# Patient Record
Sex: Female | Born: 1955 | Race: White | Hispanic: No | Marital: Single | Smoking: Former smoker
Health system: Southern US, Community
[De-identification: ages and names within clinical notes are randomized; demographics above are authoritative.]

## PROBLEM LIST (undated history)

## (undated) DIAGNOSIS — J9621 Acute and chronic respiratory failure with hypoxia: Secondary | ICD-10-CM

## (undated) DIAGNOSIS — E119 Type 2 diabetes mellitus without complications: Secondary | ICD-10-CM

## (undated) DIAGNOSIS — I219 Acute myocardial infarction, unspecified: Secondary | ICD-10-CM

## (undated) DIAGNOSIS — J449 Chronic obstructive pulmonary disease, unspecified: Secondary | ICD-10-CM

## (undated) DIAGNOSIS — R0602 Shortness of breath: Secondary | ICD-10-CM

## (undated) DIAGNOSIS — I2510 Atherosclerotic heart disease of native coronary artery without angina pectoris: Secondary | ICD-10-CM

## (undated) DIAGNOSIS — I639 Cerebral infarction, unspecified: Secondary | ICD-10-CM

## (undated) DIAGNOSIS — R918 Other nonspecific abnormal finding of lung field: Secondary | ICD-10-CM

## (undated) DIAGNOSIS — I509 Heart failure, unspecified: Secondary | ICD-10-CM

## (undated) DIAGNOSIS — M1990 Unspecified osteoarthritis, unspecified site: Secondary | ICD-10-CM

## (undated) HISTORY — PX: CORONARY ARTERY BYPASS GRAFT: SHX141

## (undated) HISTORY — PX: ABDOMINAL HYSTERECTOMY: SHX81

## (undated) HISTORY — PX: CARDIAC CATHETERIZATION: SHX172

---

## 2004-05-07 ENCOUNTER — Inpatient Hospital Stay: Payer: Self-pay | Admitting: Internal Medicine

## 2005-05-25 ENCOUNTER — Inpatient Hospital Stay (HOSPITAL_COMMUNITY): Admission: EM | Admit: 2005-05-25 | Discharge: 2005-05-27 | Payer: Self-pay | Admitting: Emergency Medicine

## 2005-08-08 ENCOUNTER — Other Ambulatory Visit: Payer: Self-pay

## 2005-08-08 ENCOUNTER — Inpatient Hospital Stay: Payer: Self-pay | Admitting: Internal Medicine

## 2005-11-16 ENCOUNTER — Other Ambulatory Visit: Payer: Self-pay

## 2005-11-16 ENCOUNTER — Inpatient Hospital Stay: Payer: Self-pay | Admitting: Internal Medicine

## 2005-12-17 ENCOUNTER — Inpatient Hospital Stay: Payer: Self-pay | Admitting: General Surgery

## 2005-12-17 ENCOUNTER — Other Ambulatory Visit: Payer: Self-pay

## 2006-07-01 ENCOUNTER — Other Ambulatory Visit: Payer: Self-pay

## 2006-07-01 ENCOUNTER — Inpatient Hospital Stay: Payer: Self-pay | Admitting: Internal Medicine

## 2007-11-07 ENCOUNTER — Ambulatory Visit: Payer: Self-pay

## 2008-03-11 ENCOUNTER — Ambulatory Visit: Payer: Self-pay | Admitting: Pulmonary Disease

## 2008-03-11 DIAGNOSIS — I2510 Atherosclerotic heart disease of native coronary artery without angina pectoris: Secondary | ICD-10-CM | POA: Insufficient documentation

## 2008-03-11 DIAGNOSIS — F17200 Nicotine dependence, unspecified, uncomplicated: Secondary | ICD-10-CM | POA: Insufficient documentation

## 2008-03-11 DIAGNOSIS — I10 Essential (primary) hypertension: Secondary | ICD-10-CM | POA: Insufficient documentation

## 2008-03-11 DIAGNOSIS — E785 Hyperlipidemia, unspecified: Secondary | ICD-10-CM

## 2008-03-13 DIAGNOSIS — J441 Chronic obstructive pulmonary disease with (acute) exacerbation: Secondary | ICD-10-CM | POA: Insufficient documentation

## 2008-06-28 IMAGING — US US THYROID
1 series · 17 of 25 positions shown · non-contrast
Comparison: none

REASON FOR EXAM: Nodule
COMMENTS:

[Series 1: us thyroid · 17 of 40 slices shown]
[im 1/40]
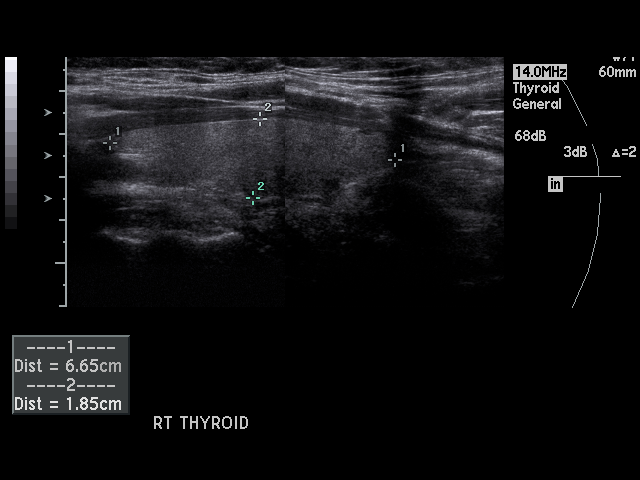
[im 4/40]
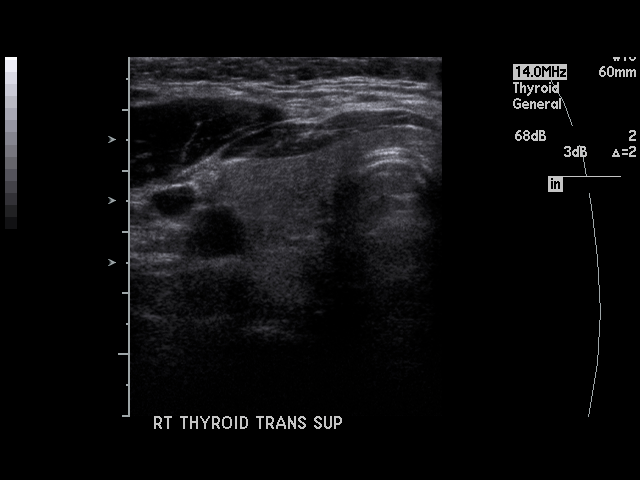
[im 5/40]
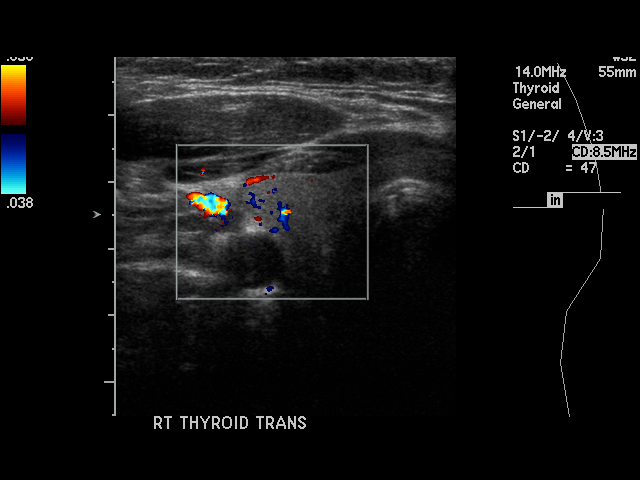
[im 9/40]
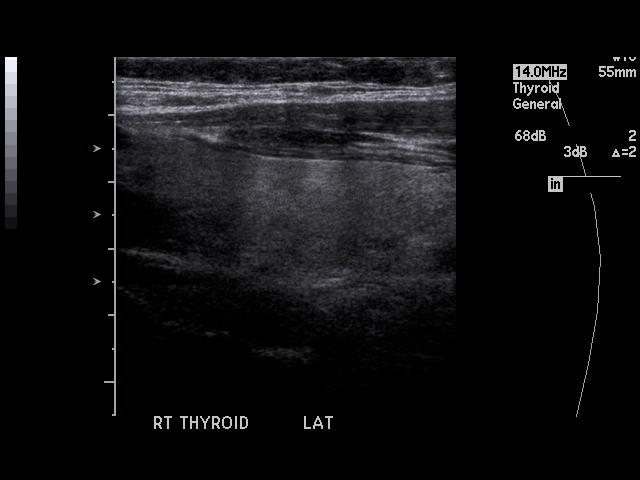
[im 10/40]
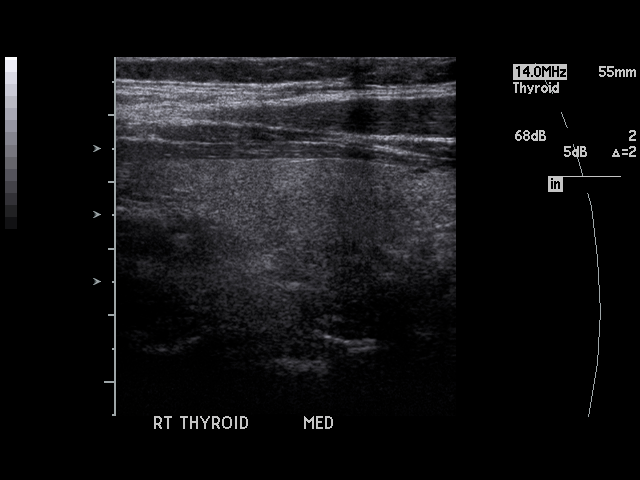
[im 14/40]
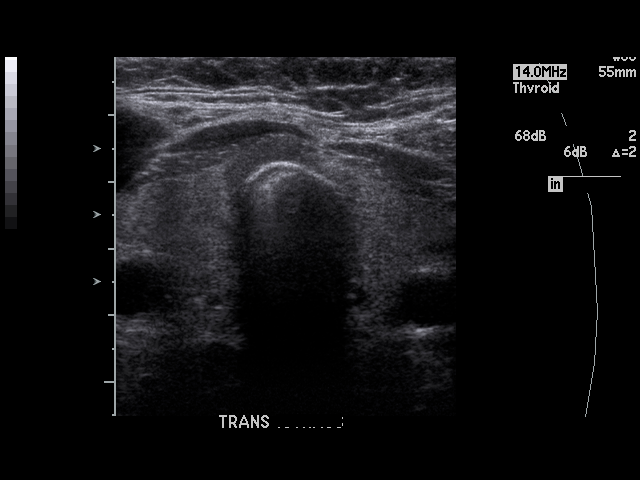
[im 15/40]
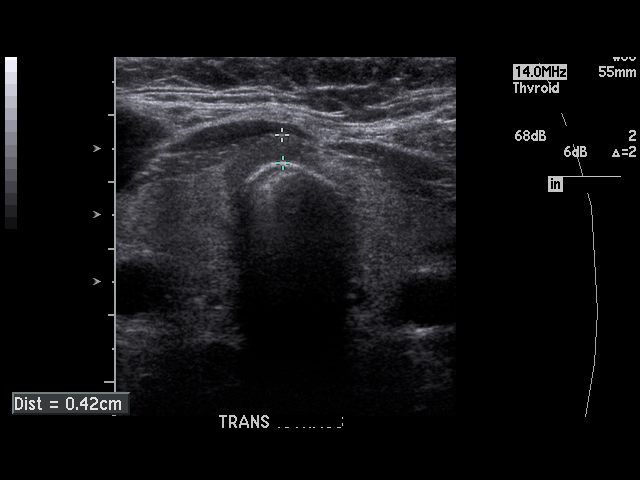
[im 18/40]
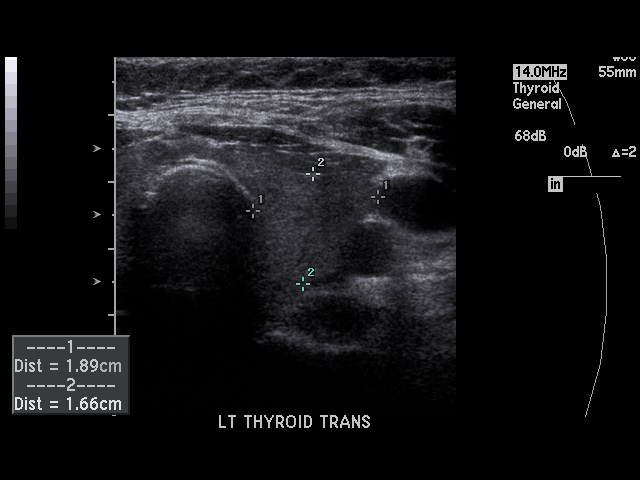
[im 20/40]
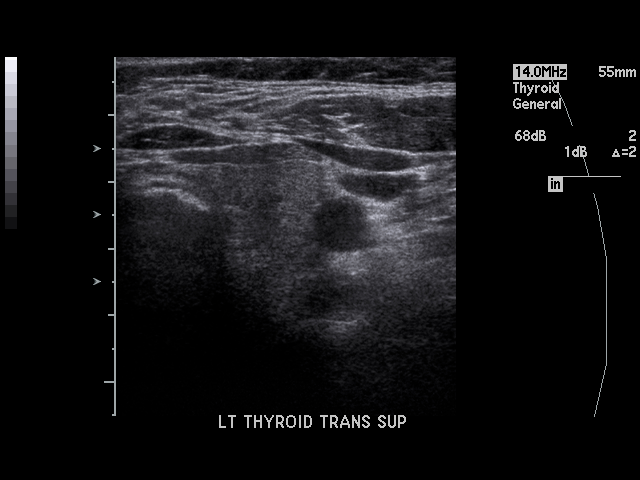
[im 22/40]
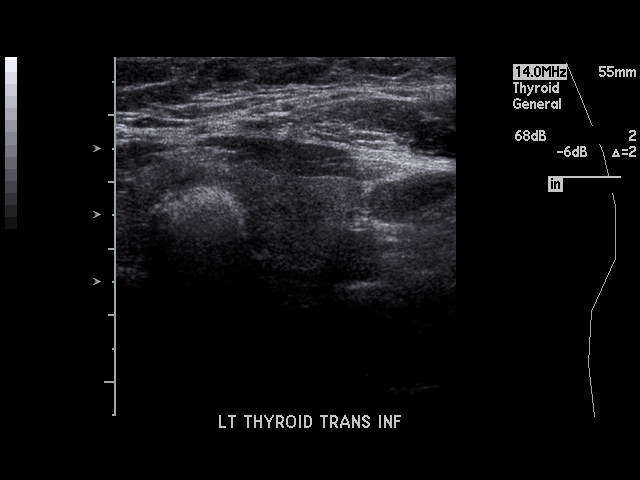
[im 25/40]
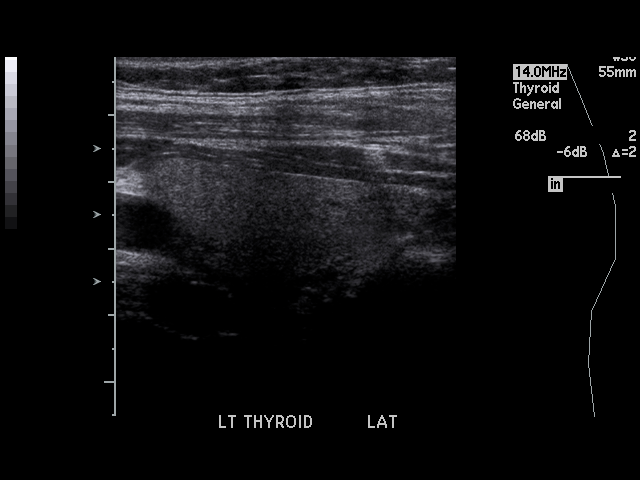
[im 27/40]
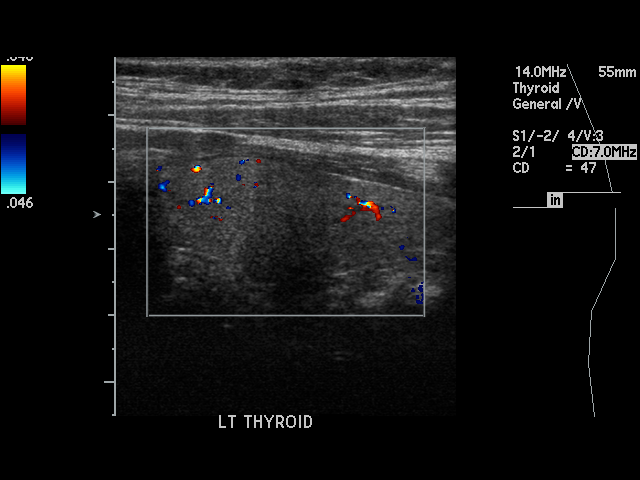
[im 30/40]
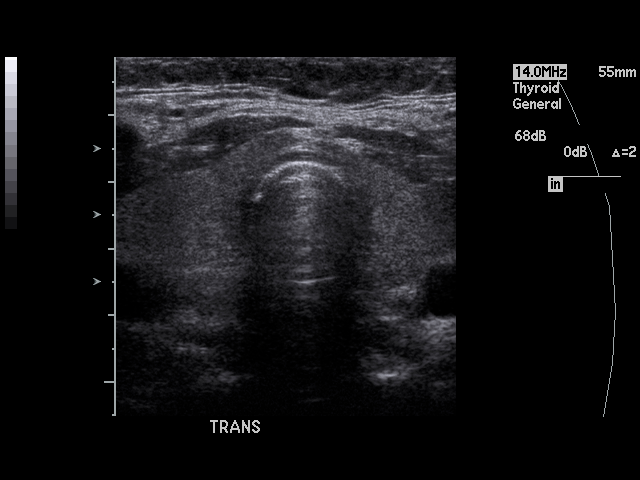
[im 31/40]
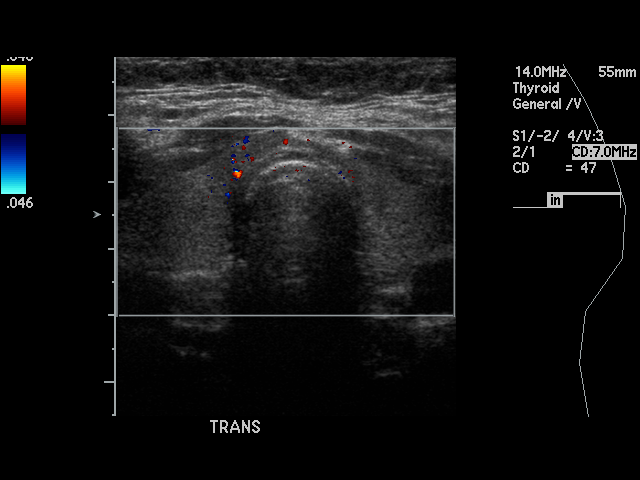
[im 35/40]
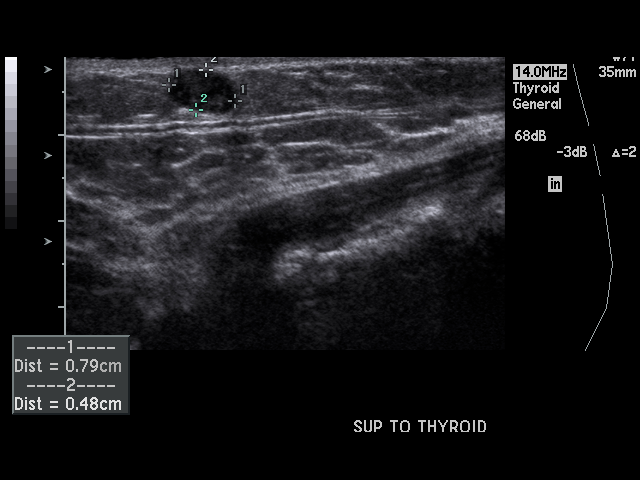
[im 36/40]
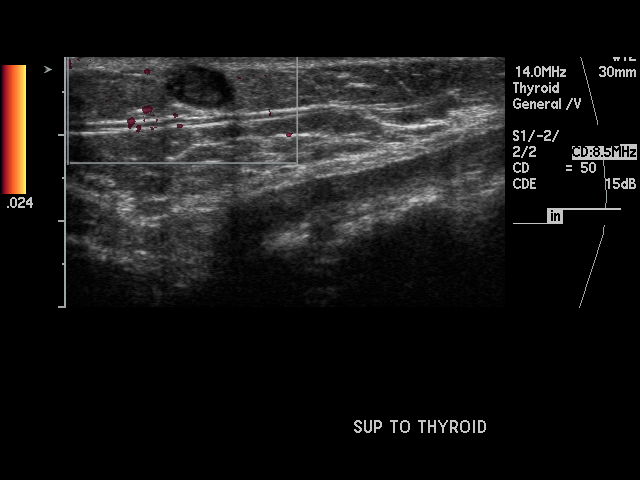
[im 40/40]
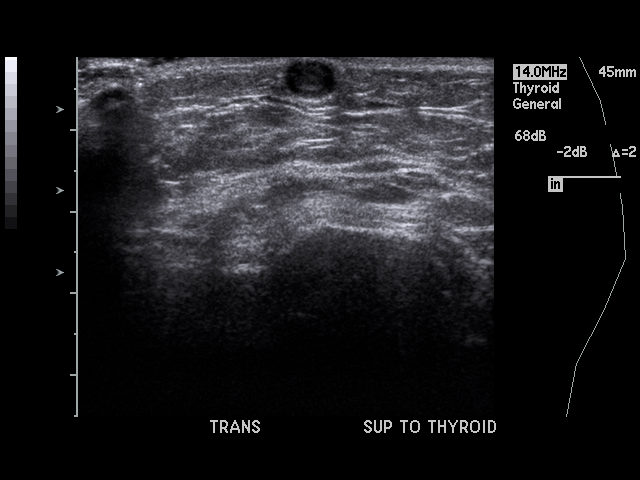

[17 of 25 positions shown; findings below may reference images not displayed]

PROCEDURE:     US  - US THYROID  - November 20, 2005  [DATE]

RESULT:       The RIGHT lobe of the thyroid measures 6.65 cm x 1.79 cm x
1.85 cm and the LEFT lobe measures 5.28 cm x 1.89 cm x 1.66 cm.  No focal
thyroid mass lesions are seen.  There is noted a hypoechoic subcutaneous
nodule measuring 7.9 mm at maximum diameter.  This nodule is hypoechoic and
extends to the inner margin of the skin.  The nodule is anterior to the
thyroid gland and appears to represent a subcutaneous complex cyst.  No
other significant abnormalities are seen.
IMPRESSION: 1.     The thyroid lobes bilaterally appear large but no thyroid masses or
nodules are identified.
2.     There is a hypoechoic subcutaneous nodule as described above.  This
contains some echoes but is thought to likely represent a complex
subcutaneous cyst.  At any rate, the nodule is separate from the thyroid and
Doppler examination shows no intrinsic vascularity.

## 2009-01-25 ENCOUNTER — Encounter: Payer: Self-pay | Admitting: Pulmonary Disease

## 2009-01-27 ENCOUNTER — Encounter (INDEPENDENT_AMBULATORY_CARE_PROVIDER_SITE_OTHER): Payer: Self-pay | Admitting: *Deleted

## 2009-02-19 ENCOUNTER — Encounter: Payer: Self-pay | Admitting: Pulmonary Disease

## 2009-03-02 ENCOUNTER — Encounter (INDEPENDENT_AMBULATORY_CARE_PROVIDER_SITE_OTHER): Payer: Self-pay | Admitting: *Deleted

## 2010-03-08 NOTE — Letter (Signed)
Summary: Appointment - Missed  [facility]     [place], [place]    Phone:   Fax:      March 02, 2009 MRN: [id number]   [name] [address] [place], [place]  [address]   Dear [name],  Our records indicate you missed your appointment on  03-01-2009    with  Dr. Jens Som  It is very important that we reach you to reschedule this appointment. We look forward to participating in your health care needs. Please contact us at the number listed above at your earliest convenience to reschedule this appointment.     Sincerely,   Lorne Skeens  [facility] Scheduling Team

## 2010-03-08 NOTE — Letter (Signed)
Summary: [facility]       [facility]       [place]   Imported By: Lester Unadilla 02/12/2009 10:02:22  _____________________________________________________________________  External Attachment:    Type:   Image     Comment:   External Document

## 2010-03-08 NOTE — Letter (Signed)
SummaryScience writer Pulmonary Care Appointment Letter  [facility]  [address]   [place], [place] [address]   Phone: [phone]  Fax: [phone]    02/19/2009 MRN: [id number]  KYRSTYN GREEAR [address] [place], [place]  [address]  Dear Ms. Alioto,   Our office is attempting to contact you about an appointment.  Please call our office at [phone] to schedule this appointment with Dr.___Alva____.  Our registration staff is prepared to assist you with any questions you may have.    Thank you,   Nature conservation officer Pulmonary Division

## 2010-06-24 NOTE — Discharge Summary (Signed)
NAMECHRISLYN, [name]               ACCOUNT NO.:  [id number]   MEDICAL RECORD NO.:  [id number]          PATIENT TYPE:  INP   LOCATION:  5505                         FACILITY:  [facility]   PHYSICIAN:  [name], MD  DATE OF BIRTH:  09/04/1955   DATE OF ADMISSION:  05/24/2005  DATE OF DISCHARGE:  05/27/2005                                 DISCHARGE SUMMARY   CHIEF COMPLAINT:  Shortness of breath and cough.   DISCHARGE DIAGNOSES:  1.  Chronic obstructive pulmonary disease exacerbation. The patient was      admitted to the telemetry floor and treated with IV steroids and      antibiotic therapy as well as nebulizers. She has refused therapy and      been non-compliant with therapy during the course of the hospital stay      and wishes to discharge to home at this time. She remains with      exacerbation type symptoms, namely coarse rhonchi and wheezing with      decreased saturation. At this time, I will provide her with bridging      antibiotics and steroids until she sees her primary care physician on      Monday, as this is a potentially life threatening situation and      regardless of her poor decision making, I will provide her with these      medications. The patient understands that she needs to followup with her      primary care practitioner.  2.  Coronary artery disease with history of coronary artery bypass grafting.      The patient refuses to comply with dietary recommendations for coronary      artery disease, namely low-salt, low-cholesterol.  3.  The patient has moderate diarrhea related to a laxative used during her      hospital stay. This has resolved.  4.  Hyperlipidemia. She will remain on her Lipitor.  5.  B12 deficiency.  She will remain on vitamin B12 as previously      prescribed.   ALLERGIES:  CIPROFLOXACIN.   DISCHARGE MEDICATIONS:  1.  Ceftin 500 mg p.o. b.i.d.  2.  Azithromycin 500 mg p.o. daily.  3.  Prednisone 60 mg once daily, which should be  tapered after she sees her      primary care physician.  4.  Metoprolol 50 mg p.o. b.i.d.  5.  Isosorbide 60 mg once daily.  6.  Lipitor 20 mg daily.  7.  Aspirin 325 mg daily.  8.  Combivent inhaler q.i.d.  9.  Milk of Magnesia as needed.  10. B12 1,000 mcg as previously prescribed. Dose at this time is unknown.  11. Vitamin B3 as previously prescribed. Dose at this time is unknown.   HISTORY OF PRESENT ILLNESS:  The patient is a 55 year old white female with  a past medical history for chronic obstructive pulmonary disease, coronary  artery disease status post coronary artery bypass grafting, tobacco abuse,  hypertension, and hypercholesterolemia. The patient presented to the  emergency room with shortness of breath, cough, and hypoxia. The patient was  found to have a negative chest x-ray but signs and symptoms of COPD  exacerbation. She was admitted to the telemetry floor, started on steroids,  antibiotics, and nebulizer treatments, which required aggressive adjustment  because of a so severe cough with hypoxia. The patient, at this time, does  not feel that she needs to be in the hospital and therefore, has refused to  stay in the hospital for further treatment, despite the fact that she has  coarse wheezing and severe cough. I will provide her with 3 days worth of  antibiotic and steroids, so that she can get an appointment with her primary  care physician but I am recommending that she leave the hospital against my  advise, as she is not ready to go home. She puts herself at risk for  complications by leaving the hospital.   Please note that the patient was non-compliant with therapy at [facility]. She was found smoking in the bathroom. She would not use her  oxygen. The patient appears to be able to comprehend the gravity of her  illness but refuses to accept the care that his prescribed by standard.   PHYSICAL EXAMINATION:  VITAL SIGNS:  Temperature 97, blood  pressure 121/86,  pulse 107, respiratory rate 18. Saturations are 94%.  GENERAL:  She is a moderately obese white female who is anxious and angry.  She does not appear to be in respiratory distress but has frequent coughing  spells that are choking.  CHEST:  Has bilateral coarse rhonchi with wheezing in all quadrants.  CARDIOVASCULAR:  Regular rate and rhythm. Positive S1 and S2. No S3 and S3.  No murmur, rub, or gallop.  ABDOMEN:  Soft, nontender, and nondistended with positive bowel sounds.  EXTREMITIES:  No clubbing, cyanosis, or edema.  NEUROLOGIC:  Awake, alert, and has the capacity to make decisions for  herself.   LABORATORY DATA:  Pertinent laboratory values reveal a white count of 18.5.  Hemoglobin and hematocrit 13.8 and 41.6. Platelets 242,000. Sodium 147,  potassium 3.6, chloride 107, CO2 32, BUN 10, creatinine 0.7. Her BNP is 62.   IMPRESSION:  At this time, the patient is NOT stable for discharge, however,  refuses to stay in the hospital and therefore, I am discharging her against  medical advice but providing bridging antibiotics and steroids until she can  reach her primary care physician.   DISPOSITION:  The patient to home.   FOLLOW UP:  With Dr. Jason Fila in [place].      [name], MD  Electronically Signed     MLT/MEDQ  D:  05/27/2005  T:  05/29/2005  Job:  [id number]   cc:   Wilfrid Lund  Fax: [phone]

## 2010-06-24 NOTE — H&P (Signed)
[name], [name] NO.:  [id number]   MEDICAL RECORD NO.:  [id number]          PATIENT TYPE:  EMS   LOCATION:  MAJO                         FACILITY:  [facility]   PHYSICIAN:  Deirdre Peer. Polite, [name] DATE OF BIRTH:  02-26-1955   DATE OF ADMISSION:  05/24/2005  DATE OF DISCHARGE:                                HISTORY & PHYSICAL   CHIEF COMPLAINT:  Shortness of breath and wheezing.   HISTORY OF PRESENT ILLNESS:  A 55 year old female with known history of  COPD, coronary artery disease, hypertension, high cholesterol who presents  to the ED with complaints of shortness of breath and wheeze.  The patient  states the symptoms started approximately 2 days ago.  She denies any fever  or chills, but a raspy cough with white colored sputum.  The patient does  admit to continued tobacco use, smokes two packs per day, and not on home  oxygen.  The patient presented to [facility], had an x-ray, and  was sent to [facility] ED for further evaluation.  In the ED, the  patient was evaluated and found to be afebrile.  Pulse was 110, respiratory  rate 28, saturating 84%.  The patient had screening labs.  BMET within  normal limits.  CBC within normal limits, BNP of 45.  Chest x-ray;  hyperinflated lung fields.  No acute infiltrate.  EKG; sinus tachycardia.  [facility] were called for further evaluation.  At the time of my  evaluation, the patient was alert and oriented x3, still with audible wheeze  and describes events as stated above.  Admission is deemed necessary for  further evaluation of COPD exacerbation.   PAST MEDICAL HISTORY:  As stated above.   MEDICATIONS:  1.  Aspirin 325 mg daily.  2.  Metoprolol 50 mg daily.  3.  Isosorbide mononitrate 60 mg daily.  4.  Lipitor 20 mg daily.   SOCIAL HISTORY:  Positive for tobacco, 2 packs a day.  No alcohol and no  drugs.   PAST SURGICAL HISTORY:  Hysterectomy in the distant past.   ALLERGIES:   Reports allergy to CIPRO which causes hives.   FAMILY HISTORY:  Mother deceased with known history of uterine CA, father  deceased from CA unknown cell type.  One brother with COPD, one with lung  cancer.  One sister fairly healthy.   REVIEW OF SYSTEMS:  As stated in the HPI.   PHYSICAL EXAMINATION:  GENERAL:  The patient is alert and oriented x3.  Still in mild to moderate respiratory distress with audible wheeze.  HEENT:  Reactive pupils.  Moist oral mucosa.  NECK:  Supple with no adenopathy.  CHEST:  Bilateral expiratory wheeze.  CARDIOVASCULAR:  Tachy, difficult examination secondary to significant  wheezing.  ABDOMEN:  Nontender.  EXTREMITIES:  No cyanosis, clubbing, or edema.  NEUROLOGY:  Nonfocal.   LABORATORY DATA:  As stated in HPI.   ASSESSMENT:  1.  Chronic obstructive pulmonary disease exacerbation in a patient who      continues to smoke.  2.  Coronary artery disease, status post coronary  artery bypass graft      approximately 4 years ago.  3.  Continued tobacco use.  4.  Hypertension.  5.  Elevated cholesterol.   I recommend the patient be admitted to a telemetry floor bed.  The patient  will be treated in a typical fashion with IV antibiotics, steroids,  nebulizers, and oxygen.  We will obtain an ABG as well.  We will also  evaluate the patient for potential home oxygen needs.  The patient will also  require counseling on tobacco cessation.  As the patient is having  significant amount of wheezing at this time, we will also hold her beta  blocker.  We will make further recommendations after review of the above  studies.      Deirdre Peer. Polite, [name]  Electronically Signed     RDP/MEDQ  D:  05/25/2005  T:  05/25/2005  Job:  [id number]

## 2012-12-15 ENCOUNTER — Emergency Department (HOSPITAL_COMMUNITY): Payer: 59

## 2012-12-15 ENCOUNTER — Encounter (HOSPITAL_COMMUNITY): Payer: Self-pay | Admitting: Emergency Medicine

## 2012-12-15 ENCOUNTER — Inpatient Hospital Stay (HOSPITAL_COMMUNITY)
Admission: EM | Admit: 2012-12-15 | Discharge: 2012-12-18 | DRG: 190 | Disposition: A | Discharge status: Home / Self Care | Payer: 59 | Attending: Internal Medicine | Admitting: Internal Medicine

## 2012-12-15 DIAGNOSIS — I10 Essential (primary) hypertension: Secondary | ICD-10-CM

## 2012-12-15 DIAGNOSIS — Z86718 Personal history of other venous thrombosis and embolism: Secondary | ICD-10-CM

## 2012-12-15 DIAGNOSIS — I2510 Atherosclerotic heart disease of native coronary artery without angina pectoris: Secondary | ICD-10-CM

## 2012-12-15 DIAGNOSIS — I252 Old myocardial infarction: Secondary | ICD-10-CM

## 2012-12-15 DIAGNOSIS — I4891 Unspecified atrial fibrillation: Secondary | ICD-10-CM | POA: Diagnosis present

## 2012-12-15 DIAGNOSIS — R0902 Hypoxemia: Secondary | ICD-10-CM

## 2012-12-15 DIAGNOSIS — J9620 Acute and chronic respiratory failure, unspecified whether with hypoxia or hypercapnia: Secondary | ICD-10-CM | POA: Diagnosis present

## 2012-12-15 DIAGNOSIS — Z951 Presence of aortocoronary bypass graft: Secondary | ICD-10-CM

## 2012-12-15 DIAGNOSIS — Z9981 Dependence on supplemental oxygen: Secondary | ICD-10-CM

## 2012-12-15 DIAGNOSIS — J441 Chronic obstructive pulmonary disease with (acute) exacerbation: Secondary | ICD-10-CM

## 2012-12-15 DIAGNOSIS — J9610 Chronic respiratory failure, unspecified whether with hypoxia or hypercapnia: Secondary | ICD-10-CM | POA: Diagnosis present

## 2012-12-15 DIAGNOSIS — Z7901 Long term (current) use of anticoagulants: Secondary | ICD-10-CM

## 2012-12-15 DIAGNOSIS — E118 Type 2 diabetes mellitus with unspecified complications: Secondary | ICD-10-CM | POA: Diagnosis present

## 2012-12-15 DIAGNOSIS — IMO0001 Reserved for inherently not codable concepts without codable children: Secondary | ICD-10-CM | POA: Diagnosis present

## 2012-12-15 DIAGNOSIS — E119 Type 2 diabetes mellitus without complications: Secondary | ICD-10-CM

## 2012-12-15 DIAGNOSIS — Z7982 Long term (current) use of aspirin: Secondary | ICD-10-CM

## 2012-12-15 DIAGNOSIS — Z87891 Personal history of nicotine dependence: Secondary | ICD-10-CM

## 2012-12-15 HISTORY — DX: Type 2 diabetes mellitus without complications: E11.9

## 2012-12-15 HISTORY — DX: Unspecified osteoarthritis, unspecified site: M19.90

## 2012-12-15 HISTORY — DX: Shortness of breath: R06.02

## 2012-12-15 HISTORY — DX: Cerebral infarction, unspecified: I63.9

## 2012-12-15 HISTORY — DX: Chronic obstructive pulmonary disease, unspecified: J44.9

## 2012-12-15 HISTORY — DX: Atherosclerotic heart disease of native coronary artery without angina pectoris: I25.10

## 2012-12-15 HISTORY — DX: Acute myocardial infarction, unspecified: I21.9

## 2012-12-15 LAB — CBC WITH DIFFERENTIAL/PLATELET
Basophils Relative: 0 % (ref 0–1)
Eosinophils Absolute: 0.3 10*3/uL (ref 0.0–0.7)
Eosinophils Relative: 3 % (ref 0–5)
Hemoglobin: 14.2 g/dL (ref 12.0–15.0)
Lymphocytes Relative: 19 % (ref 12–46)
Lymphs Abs: 1.9 10*3/uL (ref 0.7–4.0)
MCV: 96.1 fL (ref 78.0–100.0)
Monocytes Relative: 5 % (ref 3–12)
Neutrophils Relative %: 73 % (ref 43–77)
Platelets: 203 10*3/uL (ref 150–400)
WBC: 10 10*3/uL (ref 4.0–10.5)

## 2012-12-15 LAB — COMPREHENSIVE METABOLIC PANEL
AST: 12 U/L (ref 0–37)
Albumin: 3.5 g/dL (ref 3.5–5.2)
Calcium: 9.8 mg/dL (ref 8.4–10.5)
Chloride: 94 mEq/L — ABNORMAL LOW (ref 96–112)
Creatinine, Ser: 0.49 mg/dL — ABNORMAL LOW (ref 0.50–1.10)
GFR calc Af Amer: >90 mL/min (ref >90)
GFR calc non Af Amer: >90 mL/min (ref >90)
Sodium: 138 mEq/L (ref 135–145)
Total Bilirubin: 0.5 mg/dL (ref 0.3–1.2)
Total Protein: 7.5 g/dL (ref 6.0–8.3)

## 2012-12-15 LAB — PRO B NATRIURETIC PEPTIDE: Pro B Natriuretic peptide (BNP): 53.5 pg/mL (ref 0–125)

## 2012-12-15 LAB — POCT I-STAT 3, ART BLOOD GAS (G3+)
Bicarbonate: 40 mEq/L — ABNORMAL HIGH (ref 20.0–24.0)
pCO2 arterial: 76.1 mmHg (ref 35.0–45.0)
pH, Arterial: 7.329 — ABNORMAL LOW (ref 7.350–7.450)

## 2012-12-15 LAB — TROPONIN I: Troponin I: <0.3 ng/mL (ref <0.30)

## 2012-12-15 MED ORDER — ALBUTEROL SULFATE (5 MG/ML) 0.5% IN NEBU
2.5000 mg | INHALATION_SOLUTION | RESPIRATORY_TRACT | Status: DC
  Administered 2012-12-15: 2.5 mg via RESPIRATORY_TRACT
  Filled 2012-12-15: qty 0.5

## 2012-12-15 MED ORDER — IPRATROPIUM BROMIDE 0.02 % IN SOLN
0.5000 mg | RESPIRATORY_TRACT | Status: DC
  Administered 2012-12-15: 0.5 mg via RESPIRATORY_TRACT
  Filled 2012-12-15: qty 2.5

## 2012-12-15 MED ORDER — ALBUTEROL SULFATE (5 MG/ML) 0.5% IN NEBU: 2.5000 mg | INHALATION_SOLUTION | RESPIRATORY_TRACT | Status: DC | PRN

## 2012-12-15 MED ORDER — DEXTROSE 5 % IV SOLN
500.0000 mg | Freq: Once | INTRAVENOUS | Status: AC
  Administered 2012-12-16: 500 mg via INTRAVENOUS

## 2012-12-15 MED ORDER — DEXTROSE 5 % IV SOLN
1.0000 g | Freq: Once | INTRAVENOUS | Status: AC
  Administered 2012-12-16: 1 g via INTRAVENOUS
  Filled 2012-12-15: qty 10

## 2012-12-15 NOTE — H&P (Signed)
[facility] History and Physical  Patient: [name]  ZOX:[id number]  DOB: 1955/10/07  DOS: 12/15/2012 PCP: No PCP Per Patient  Chief Complaint: Shortness of breath  HPI: [name] is a 57 y.o. female with Past medical history of COPD, diabetes on the, coronary artery disease, hypertension. The patient is coming from home. The patient presented with complaints of shortness of breath that initiated today.she mentions that she has history of shortness of breath on exertion and her symptoms have been progressively getting worse over last few months primary since last to 3 days her symptoms her progress significantly. currently she mentions that since she woke up today she has  been short of breath, by the afternoon her symptoms were severe that she called EMS. EMS found her to be hypoxic in the high 80s on her thyroid is of oxygen.at the time of my evaluation in the ED she was on 5 L and saturating 97%. The patient also mentions that initially she felt some chest tightness which is resolved right now. She did not have any complaints of fever, chills, palpitation, diaphoresis, nausea, diarrhea, active bleeding, burning urination, leg swelling. She is compliant with all her medication and has quit smoking 3 years ago. she does not have a pulmonologist .   Review of Systems: as mentioned in the history of present illness.  A Comprehensive review of the other systems is negative.  Past Medical History  Diagnosis Date  . Diabetes mellitus without complication   . COPD (chronic obstructive pulmonary disease)   . Acute MI    Past Surgical History  Procedure Laterality Date  . Abdominal hysterectomy     Social History:  reports that she quit smoking about 2 years ago. She does not have any smokeless tobacco history on file. She reports that she does not drink alcohol. Her drug history is not on file. Independent for most of her  ADL.  Allergies  Allergen Reactions  . Ciprofloxacin      REACTION: hives/rash    No family history on file.  Prior to Admission medications   Medication Sig Start Date End Date Taking? Authorizing Provider  albuterol-ipratropium (COMBIVENT) 18-103 MCG/ACT inhaler Inhale 1-2 puffs into the lungs every 4 (four) hours as needed for wheezing or shortness of breath.   Yes Historical Provider, MD  aspirin EC 81 MG tablet Take 81 mg by mouth daily.   Yes Historical Provider, MD  atorvastatin (LIPITOR) 20 MG tablet Take 1 tablet by mouth daily. 12/14/12  Yes Historical Provider, MD  glimepiride (AMARYL) 2 MG tablet Take 2 mg by mouth daily. 11/17/12  Yes Historical Provider, MD  Hydrocodone-Acetaminophen 7.5-300 MG TABS Take 1 tablet by mouth every 6 (six) hours as needed. For pain 11/21/12  Yes Historical Provider, MD  ipratropium-albuterol (DUONEB) 0.5-2.5 (3) MG/3ML SOLN Take 3 mLs by nebulization every 4 (four) hours as needed (for shortness of breath).   Yes Historical Provider, MD  isosorbide mononitrate (IMDUR) 60 MG 24 hr tablet Take 1 tablet by mouth daily. 12/08/12  Yes Historical Provider, MD  metoprolol tartrate (LOPRESSOR) 25 MG tablet Take 1 tablet by mouth 2 (two) times daily. 11/17/12  Yes Historical Provider, MD  pantoprazole (PROTONIX) 40 MG tablet Take 40 mg by mouth daily. 11/20/12  Yes Historical Provider, MD  PROAIR HFA 108 (90 BASE) MCG/ACT inhaler Inhale 1 puff into the lungs every 4 (four) hours as needed. For shortness of breath 11/29/12  Yes Historical Provider, MD  theophylline (THEODUR) 200 MG 12  hr tablet Take 200 mg by mouth daily. 12/11/12  Yes Historical Provider, MD  XARELTO 20 MG TABS tablet Take 20 mg by mouth daily. 12/04/12  Yes Historical Provider, MD    Physical Exam: Filed Vitals:   12/15/12 2202 12/15/12 2215  BP: 146/105 136/63  Pulse:  125  Temp: 99.5 F (37.5 C)   TempSrc: Axillary   Resp: 22 22  Height: 5\' 6"  (1.676 m)   SpO2: 98% 97%     General: Alert, Awake and Oriented to Time, Place and Person.  Appear in moderate distress Eyes: PERRL ENT: Oral Mucosa clear moist. Neck: no JVD Cardiovascular: S1 and S2 Present, no Murmur, Peripheral Pulses Present Respiratory: Bilateral Air entry equal and Decreased, no Crackles, bilateral expiratory wheezes Abdomen: Bowel Sound Present, Soft and Non tender Skin: no Rash Extremities:  trace  Pedal edema, no calf tenderness Neurologic: Grossly Unremarkable  Labs on Admission:  CBC:  Recent Labs Lab 12/15/12 2221  WBC 10.0  NEUTROABS 7.3  HGB 14.2  HCT 44.9  MCV 96.1  PLT 203    CMP     Component Value Date/Time   NA 138 12/15/2012 2221   K 4.1 12/15/2012 2221   CL 94* 12/15/2012 2221   CO2 36* 12/15/2012 2221   GLUCOSE 346* 12/15/2012 2221   BUN 11 12/15/2012 2221   CREATININE 0.49* 12/15/2012 2221   CALCIUM 9.8 12/15/2012 2221   PROT 7.5 12/15/2012 2221   ALBUMIN 3.5 12/15/2012 2221   AST 12 12/15/2012 2221   ALT 15 12/15/2012 2221   ALKPHOS 121* 12/15/2012 2221   BILITOT 0.5 12/15/2012 2221   GFRNONAA >90 12/15/2012 2221   GFRAA >90 12/15/2012 2221    No results found for this basename: LIPASE, AMYLASE,  in the last 168 hours No results found for this basename: AMMONIA,  in the last 168 hours   Recent Labs Lab 12/15/12 2227  TROPONINI <0.30   BNP (last 3 results)  Recent Labs  12/15/12 2227  PROBNP 53.5    Radiological Exams on Admission: Dg Chest Port 1 View  12/15/2012   CLINICAL DATA:  Chest pain, cough, chest congestion, shortness of breath.  EXAM: PORTABLE CHEST - 1 VIEW  COMPARISON:  05/25/2005.  FINDINGS: Normal sized heart. Clear lungs. The lungs remain mildly hyperexpanded with mildly prominent interstitial markings. Stable post CABG changes. Diffuse osteopenia.  IMPRESSION: No acute abnormality. Stable changes of COPD.   Electronically Signed   By: Gordan Payment [name]   On: 12/15/2012 22:41    EKG: Independently reviewed. sinus tachycardia.  Assessment/Plan Principal Problem:   C O P D WITH ACUTE  EXACERBATION Active Problems:   HYPERTENSION   CORONARY HEART DISEASE   1. Obstructive chronic bronchitis with exacerbation  the patient is presenting with COPD exacerbation, with complaints of cough shortness of breath and pleuritic chest pain. She has a chest x-ray which is normal. She was initially hypoxic but her symptoms improved with nebulization. At present she is admitted to the hospital we will continue her on IV Solu-Medrol, IV ceftriaxone and IV azithromycin, duo nebs every 4 hour and 2 hour as needed. We will also give her a flutter wall to clear her secretions. She will from followup with a pulmonologist  2.Hypertension  Continuing metoprolol   3. A. fib  Continues rivaroxaban currently in sinus rhythm ,   4. diabetes mellitus  Placed on sliding scale, hold Amaryl   DVT Prophylaxis: mechanical compression device Nutrition:  cardiac and diabetic diet  Code Status:  who   Family Communication:  family  was present at bedside, opportunity was given to ask question and all questions were answered satisfactorily at the time of interview. Disposition: Admitted to inpatient in telemetry unit.  Author: Lynden Oxford, MD [facility] Pager: [phone] 12/15/2012, 11:48 PM    If 7PM-7AM, please contact night-coverage [email or web address] Password [email or web address]

## 2012-12-15 NOTE — ED Provider Notes (Addendum)
CSN: [id number]     Arrival date & time 12/15/12  2156 History   First MD Initiated Contact with Patient 12/15/12 2201     Chief Complaint  Patient presents with  . Shortness of Breath   (Consider location/radiation/quality/duration/timing/severity/associated sxs/prior Treatment) HPI Comments: Patient is a 57 year old female with past medical history significant for COPD, artery artery disease with bypass graft in the past. She is on home oxygen at 5 L per minute. She presents by EMS with complaints of severe shortness of breath that started shortly after walking up the stairs. She was found by EMS her oxygen saturations were in the low 80s while on her normal 5 L. She was given albuterol treatments and Solu-Medrol and route however these have helped very little. She denies any fevers or chills. She does report there is pressure in the center of her chest however there is no diaphoresis, nausea, or radiation to the arm or jaw.  Patient is a 57 y.o. female presenting with shortness of breath. The history is provided by the patient.  Shortness of Breath Severity:  Severe Onset quality:  Sudden Timing:  Constant Progression:  Worsening Chronicity:  New Context: activity   Relieved by:  Nothing Worsened by:  Nothing tried Ineffective treatments:  None tried   No past medical history on file. No past surgical history on file. No family history on file. History  Substance Use Topics  . Smoking status: Not on file  . Smokeless tobacco: Not on file  . Alcohol Use: Not on file   OB History   No data available     Review of Systems  Respiratory: Positive for shortness of breath.   All other systems reviewed and are negative.    Allergies  Ciprofloxacin  Home Medications  No current outpatient prescriptions on file. There were no vitals taken for this visit. Physical Exam  Nursing note and vitals reviewed. Constitutional: She is oriented to person, place, and time. She appears  well-developed and well-nourished. No distress.  HENT:  Head: Normocephalic and atraumatic.  Neck: Normal range of motion. Neck supple.  Cardiovascular: Normal rate and regular rhythm.  Exam reveals no gallop and no friction rub.   No murmur heard. Pulmonary/Chest: She is in respiratory distress. She has wheezes.  Patient arrives in moderate respiratory distress. She is tachypneic and air movement is poor.  Abdominal: Soft. Bowel sounds are normal. She exhibits no distension. There is no tenderness.  Musculoskeletal: Normal range of motion. She exhibits no edema.  Neurological: She is alert and oriented to person, place, and time.  Skin: Skin is warm and dry. She is not diaphoretic.    ED Course  Procedures (including critical care time) Labs Review Labs Reviewed  CBC WITH DIFFERENTIAL  COMPREHENSIVE METABOLIC PANEL  PRO B NATRIURETIC PEPTIDE  TROPONIN I   Imaging Review No results found.   Date: 12/15/2012  Rate: 119  Rhythm: sinus tachycardia  QRS Axis: normal  Intervals: normal  ST/T Wave abnormalities: normal  Conduction Disutrbances:none  Narrative Interpretation:   Old EKG Reviewed: unchanged    MDM  No diagnosis found. Patient is a 57 year old female with history of coronary artery disease status post bypass graft and COPD. She presents with chest discomfort and shortness of breath that started after walking up the stairs. She is found to be hypoxic on 5 L of oxygen which he normally wears. She was given Solu-Medrol and breathing treatments en route. She continues to be tachypneic and hypoxic upon arrival.  She is given additional nebulizer treatments and workup was initiated. Her EKG revealed a sinus tachycardia without any acute changes. Troponin was negative and BNP was only 15. Her ABG reveals hypoxia with CO2 retention. I've spoken with Dr. Allena Katz from [facility] who agrees to admit the patient. I will treat with Rocephin and Zithromax for presumptive  pneumonia.  CRITICAL CARE Performed by: Geoffery Lyons Total critical care time: 30 minutes Critical care time was exclusive of separately billable procedures and treating other patients. Critical care was necessary to treat or prevent imminent or life-threatening deterioration. Critical care was time spent personally by me on the following activities: development of treatment plan with patient and/or surrogate as well as nursing, discussions with consultants, evaluation of patient's response to treatment, examination of patient, obtaining history from patient or surrogate, ordering and performing treatments and interventions, ordering and review of laboratory studies, ordering and review of radiographic studies, pulse oximetry and re-evaluation of patient's condition.     Geoffery Lyons, MD 12/15/12 2346  Geoffery Lyons, MD 12/15/12 [phone]

## 2012-12-15 NOTE — ED Notes (Signed)
Pt to ED via [facility] for evaluation of shortness of breath and chest pain.  Pt called EMS complaining of chest pain- upon their arrival chest pain had subsided but pt was short of breath- pt took 324 ASA- EMS administered 125 Solumedrol.  Upon arrival to ED pt using accessory muscles to breath, respirations 24/minute.  Respiratory therapy at bedside- pt completing neb treatment from EMS 5 Albuterol, 0.5 Atrovent.

## 2012-12-16 ENCOUNTER — Encounter (HOSPITAL_COMMUNITY): Payer: Self-pay | Admitting: Family Medicine

## 2012-12-16 DIAGNOSIS — I519 Heart disease, unspecified: Secondary | ICD-10-CM

## 2012-12-16 DIAGNOSIS — J441 Chronic obstructive pulmonary disease with (acute) exacerbation: Principal | ICD-10-CM

## 2012-12-16 LAB — GLUCOSE, CAPILLARY
Glucose-Capillary: 319 mg/dL — ABNORMAL HIGH (ref 70–99)
Glucose-Capillary: 241 mg/dL — ABNORMAL HIGH (ref 70–99)
Glucose-Capillary: 373 mg/dL — ABNORMAL HIGH (ref 70–99)

## 2012-12-16 LAB — CBC
HCT: 43.6 % (ref 36.0–46.0)
MCHC: 32.1 g/dL (ref 30.0–36.0)
MCV: 96 fL (ref 78.0–100.0)
Platelets: 198 10*3/uL (ref 150–400)
RBC: 4.54 MIL/uL (ref 3.87–5.11)
WBC: 11.7 10*3/uL — ABNORMAL HIGH (ref 4.0–10.5)

## 2012-12-16 LAB — HEMOGLOBIN A1C
Hgb A1c MFr Bld: 9.2 % — ABNORMAL HIGH (ref <5.7)
Mean Plasma Glucose: 217 mg/dL — ABNORMAL HIGH (ref <117)

## 2012-12-16 LAB — TROPONIN I: Troponin I: <0.3 ng/mL (ref <0.30)

## 2012-12-16 LAB — COMPREHENSIVE METABOLIC PANEL
ALT: 18 U/L (ref 0–35)
Albumin: 3.6 g/dL (ref 3.5–5.2)
Alkaline Phosphatase: 144 U/L — ABNORMAL HIGH (ref 39–117)
BUN: 13 mg/dL (ref 6–23)
Chloride: 90 mEq/L — ABNORMAL LOW (ref 96–112)
Creatinine, Ser: 0.55 mg/dL (ref 0.50–1.10)
Glucose, Bld: 496 mg/dL — ABNORMAL HIGH (ref 70–99)
Potassium: 4 mEq/L (ref 3.5–5.1)
Total Bilirubin: 0.4 mg/dL (ref 0.3–1.2)
Total Protein: 7.8 g/dL (ref 6.0–8.3)

## 2012-12-16 LAB — PROTIME-INR
INR: 1.19 (ref 0.00–1.49)
Prothrombin Time: 14.8 seconds (ref 11.6–15.2)

## 2012-12-16 MED ORDER — ALBUTEROL SULFATE (5 MG/ML) 0.5% IN NEBU: 2.5000 mg | INHALATION_SOLUTION | RESPIRATORY_TRACT | Status: DC | PRN

## 2012-12-16 MED ORDER — INSULIN ASPART 100 UNIT/ML ~~LOC~~ SOLN
0.0000 [IU] | Freq: Three times a day (TID) | SUBCUTANEOUS | Status: DC
  Administered 2012-12-16: 12:00:00 11 [IU] via SUBCUTANEOUS
  Administered 2012-12-16: 8 [IU] via SUBCUTANEOUS

## 2012-12-16 MED ORDER — ALBUTEROL SULFATE (5 MG/ML) 0.5% IN NEBU
2.5000 mg | INHALATION_SOLUTION | RESPIRATORY_TRACT | Status: DC
  Administered 2012-12-16: 02:00:00 2.5 mg via RESPIRATORY_TRACT
  Filled 2012-12-16: qty 0.5

## 2012-12-16 MED ORDER — PANTOPRAZOLE SODIUM 40 MG PO TBEC
40.0000 mg | DELAYED_RELEASE_TABLET | Freq: Every day | ORAL | Status: DC
  Administered 2012-12-16 – 2012-12-18 (×3): 40 mg via ORAL
  Filled 2012-12-16 (×3): qty 1

## 2012-12-16 MED ORDER — HYDROCODONE-ACETAMINOPHEN 5-325 MG PO TABS
1.0000 | ORAL_TABLET | ORAL | Status: DC | PRN
  Administered 2012-12-16 (×3): 1 via ORAL
  Administered 2012-12-16: 2 via ORAL
  Administered 2012-12-17: 1 via ORAL
  Administered 2012-12-18: 2 via ORAL
  Administered 2012-12-18: 1 via ORAL
  Filled 2012-12-16: qty 2
  Filled 2012-12-16 (×3): qty 1
  Filled 2012-12-16 (×3): qty 2

## 2012-12-16 MED ORDER — INSULIN GLARGINE 100 UNIT/ML ~~LOC~~ SOLN
10.0000 [IU] | Freq: Every day | SUBCUTANEOUS | Status: DC
  Administered 2012-12-16 – 2012-12-17 (×2): 10 [IU] via SUBCUTANEOUS
  Filled 2012-12-16 (×3): qty 0.1

## 2012-12-16 MED ORDER — INSULIN ASPART 100 UNIT/ML ~~LOC~~ SOLN: 0.0000 [IU] | Freq: Every day | SUBCUTANEOUS | Status: DC

## 2012-12-16 MED ORDER — ACETAMINOPHEN 650 MG RE SUPP: 650.0000 mg | Freq: Four times a day (QID) | RECTAL | Status: DC | PRN

## 2012-12-16 MED ORDER — ISOSORBIDE MONONITRATE ER 60 MG PO TB24
60.0000 mg | ORAL_TABLET | Freq: Every day | ORAL | Status: DC
  Administered 2012-12-16 – 2012-12-18 (×3): 60 mg via ORAL
  Filled 2012-12-16 (×3): qty 1

## 2012-12-16 MED ORDER — INSULIN ASPART 100 UNIT/ML ~~LOC~~ SOLN
0.0000 [IU] | Freq: Every day | SUBCUTANEOUS | Status: DC
  Administered 2012-12-16: 5 [IU] via SUBCUTANEOUS
  Administered 2012-12-17: 23:00:00 3 [IU] via SUBCUTANEOUS

## 2012-12-16 MED ORDER — SIMETHICONE 80 MG PO CHEW
80.0000 mg | CHEWABLE_TABLET | Freq: Four times a day (QID) | ORAL | Status: DC | PRN
  Administered 2012-12-16: 80 mg via ORAL
  Filled 2012-12-16: qty 1

## 2012-12-16 MED ORDER — DEXTROSE 5 % IV SOLN
500.0000 mg | INTRAVENOUS | Status: DC
  Administered 2012-12-16: 500 mg via INTRAVENOUS
  Filled 2012-12-16: qty 500

## 2012-12-16 MED ORDER — INSULIN ASPART 100 UNIT/ML ~~LOC~~ SOLN
7.0000 [IU] | Freq: Once | SUBCUTANEOUS | Status: AC
  Administered 2012-12-16: 02:00:00 7 [IU] via SUBCUTANEOUS

## 2012-12-16 MED ORDER — METOPROLOL TARTRATE 25 MG PO TABS
25.0000 mg | ORAL_TABLET | Freq: Two times a day (BID) | ORAL | Status: DC
  Administered 2012-12-16 – 2012-12-18 (×6): 25 mg via ORAL
  Filled 2012-12-16 (×7): qty 1

## 2012-12-16 MED ORDER — IPRATROPIUM BROMIDE 0.02 % IN SOLN
0.5000 mg | RESPIRATORY_TRACT | Status: DC
  Administered 2012-12-16 – 2012-12-18 (×16): 0.5 mg via RESPIRATORY_TRACT
  Filled 2012-12-16 (×16): qty 2.5

## 2012-12-16 MED ORDER — ACETAMINOPHEN 325 MG PO TABS: 650.0000 mg | ORAL_TABLET | Freq: Four times a day (QID) | ORAL | Status: DC | PRN

## 2012-12-16 MED ORDER — METHYLPREDNISOLONE SODIUM SUCC 125 MG IJ SOLR
60.0000 mg | Freq: Four times a day (QID) | INTRAMUSCULAR | Status: DC
  Administered 2012-12-16 – 2012-12-17 (×6): 60 mg via INTRAVENOUS
  Filled 2012-12-16: qty 2
  Filled 2012-12-16 (×6): qty 0.96
  Filled 2012-12-16: qty 2
  Filled 2012-12-16 (×4): qty 0.96

## 2012-12-16 MED ORDER — ATORVASTATIN CALCIUM 20 MG PO TABS
20.0000 mg | ORAL_TABLET | Freq: Every day | ORAL | Status: DC
  Administered 2012-12-16 – 2012-12-18 (×3): 20 mg via ORAL
  Filled 2012-12-16 (×3): qty 1

## 2012-12-16 MED ORDER — RIVAROXABAN 20 MG PO TABS
20.0000 mg | ORAL_TABLET | Freq: Every day | ORAL | Status: DC
  Administered 2012-12-16 – 2012-12-17 (×2): 20 mg via ORAL
  Filled 2012-12-16 (×3): qty 1

## 2012-12-16 MED ORDER — GUAIFENESIN ER 600 MG PO TB12
600.0000 mg | ORAL_TABLET | Freq: Two times a day (BID) | ORAL | Status: DC
  Administered 2012-12-16 – 2012-12-18 (×6): 600 mg via ORAL
  Filled 2012-12-16 (×7): qty 1

## 2012-12-16 MED ORDER — LEVALBUTEROL HCL 0.63 MG/3ML IN NEBU
0.6300 mg | INHALATION_SOLUTION | RESPIRATORY_TRACT | Status: DC | PRN
  Administered 2012-12-16: 13:00:00 0.63 mg via RESPIRATORY_TRACT
  Filled 2012-12-16: qty 3

## 2012-12-16 MED ORDER — ONDANSETRON HCL 4 MG PO TABS
4.0000 mg | ORAL_TABLET | Freq: Four times a day (QID) | ORAL | Status: DC | PRN
  Administered 2012-12-17 – 2012-12-18 (×3): 4 mg via ORAL
  Filled 2012-12-16 (×6): qty 1

## 2012-12-16 MED ORDER — SODIUM CHLORIDE 0.9 % IJ SOLN
3.0000 mL | Freq: Two times a day (BID) | INTRAMUSCULAR | Status: DC
  Administered 2012-12-16 – 2012-12-18 (×6): 3 mL via INTRAVENOUS

## 2012-12-16 MED ORDER — ASPIRIN EC 81 MG PO TBEC
81.0000 mg | DELAYED_RELEASE_TABLET | Freq: Every day | ORAL | Status: DC
  Administered 2012-12-16 – 2012-12-18 (×3): 81 mg via ORAL
  Filled 2012-12-16 (×3): qty 1

## 2012-12-16 MED ORDER — LEVALBUTEROL HCL 0.63 MG/3ML IN NEBU
0.6300 mg | INHALATION_SOLUTION | RESPIRATORY_TRACT | Status: DC
  Administered 2012-12-16 – 2012-12-18 (×16): 0.63 mg via RESPIRATORY_TRACT
  Filled 2012-12-16 (×33): qty 3

## 2012-12-16 MED ORDER — ONDANSETRON HCL 4 MG/2ML IJ SOLN
4.0000 mg | Freq: Four times a day (QID) | INTRAMUSCULAR | Status: DC | PRN
  Administered 2012-12-17: 17:00:00 4 mg via INTRAVENOUS
  Filled 2012-12-16: qty 2

## 2012-12-16 MED ORDER — ALBUTEROL SULFATE (5 MG/ML) 0.5% IN NEBU: 2.5000 mg | INHALATION_SOLUTION | RESPIRATORY_TRACT | Status: DC

## 2012-12-16 MED ORDER — THEOPHYLLINE ER 200 MG PO TB12
200.0000 mg | ORAL_TABLET | Freq: Every day | ORAL | Status: DC
  Administered 2012-12-16 – 2012-12-18 (×3): 200 mg via ORAL
  Filled 2012-12-16 (×3): qty 1

## 2012-12-16 MED ORDER — DEXTROSE 5 % IV SOLN
1.0000 g | INTRAVENOUS | Status: DC
  Administered 2012-12-16 – 2012-12-18 (×2): 1 g via INTRAVENOUS
  Filled 2012-12-16 (×4): qty 10

## 2012-12-16 MED ORDER — LEVALBUTEROL HCL 0.63 MG/3ML IN NEBU: 0.6300 mg | INHALATION_SOLUTION | Freq: Four times a day (QID) | RESPIRATORY_TRACT | Status: DC | PRN

## 2012-12-16 MED ORDER — INSULIN ASPART 100 UNIT/ML ~~LOC~~ SOLN
0.0000 [IU] | Freq: Three times a day (TID) | SUBCUTANEOUS | Status: DC
  Administered 2012-12-16: 7 [IU] via SUBCUTANEOUS
  Administered 2012-12-17: 15 [IU] via SUBCUTANEOUS
  Administered 2012-12-17: 20 [IU] via SUBCUTANEOUS
  Administered 2012-12-17 – 2012-12-18 (×2): 11 [IU] via SUBCUTANEOUS
  Administered 2012-12-18: 12:00:00 7 [IU] via SUBCUTANEOUS

## 2012-12-16 NOTE — Progress Notes (Addendum)
Pt says she's diabetic believes she is ok with pills. Is not aware of how to count carbs- Dietician consulted. Exit care notes on Eating out with diabetes, how to read labels and sample meal plans given to husband since pt becoming anxious over thought of having to be on shots. First Diabetic video placed on TV for pt and family to view. Husband had some interest in teaching. Pt says she's becoming more short of breath. Wears her oxygen cannula in her mouth says it just does better. Wears it on her nose while she takes breathing treatments. PRN breathing treatment given. 02 sats between 93 to 95 % on IV solumedrol.

## 2012-12-16 NOTE — H&P (Deleted)
[facility] History and Physical  Patient: [name]  ZOX:[id number]  DOB: 08/01/1955  DOS: 12/16/2012 PCP: No PCP Per Patient  Chief Complaint: Shortness of breath  HPI: [name] is a 57 y.o. female with Past medical history of COPD, diabetes on the, coronary artery disease, hypertension. The patient is coming from home. The patient presented with complaints of shortness of breath that initiated today.she mentions that she has history of shortness of breath on exertion and her symptoms have been progressively getting worse over last few months primary since last to 3 days her symptoms her progress significantly. currently she mentions that since she woke up today she has  been short of breath, by the afternoon her symptoms were severe that she called EMS. EMS found her to be hypoxic in the high 80s on her thyroid is of oxygen.at the time of my evaluation in the ED she was on 5 L and saturating 97%. The patient also mentions that initially she felt some chest tightness which is resolved right now. She did not have any complaints of fever, chills, palpitation, diaphoresis, nausea, diarrhea, active bleeding, burning urination, leg swelling. She is compliant with all her medication and has quit smoking 3 years ago. she does not have a pulmonologist .   Review of Systems: as mentioned in the history of present illness.  A Comprehensive review of the other systems is negative.  Past Medical History  Diagnosis Date  . Diabetes mellitus without complication   . COPD (chronic obstructive pulmonary disease)   . Acute MI   . Coronary artery disease   . Shortness of breath   . Stroke TIA   . Arthritis    Past Surgical History  Procedure Laterality Date  . Abdominal hysterectomy    . Coronary artery bypass graft    . Cardiac catheterization     Social History:  reports that she quit smoking about 2 years ago. She does not have any smokeless tobacco history on file. She reports  that she does not drink alcohol. Her drug history is not on file. Independent for most of her  ADL.  Allergies  Allergen Reactions  . Ciprofloxacin     REACTION: hives/rash    No family history on file.  Prior to Admission medications   Medication Sig Start Date End Date Taking? Authorizing Provider  albuterol-ipratropium (COMBIVENT) 18-103 MCG/ACT inhaler Inhale 1-2 puffs into the lungs every 4 (four) hours as needed for wheezing or shortness of breath.   Yes Historical Provider, MD  aspirin EC 81 MG tablet Take 81 mg by mouth daily.   Yes Historical Provider, MD  atorvastatin (LIPITOR) 20 MG tablet Take 1 tablet by mouth daily. 12/14/12  Yes Historical Provider, MD  glimepiride (AMARYL) 2 MG tablet Take 2 mg by mouth daily. 11/17/12  Yes Historical Provider, MD  Hydrocodone-Acetaminophen 7.5-300 MG TABS Take 1 tablet by mouth every 6 (six) hours as needed. For pain 11/21/12  Yes Historical Provider, MD  ipratropium-albuterol (DUONEB) 0.5-2.5 (3) MG/3ML SOLN Take 3 mLs by nebulization every 4 (four) hours as needed (for shortness of breath).   Yes Historical Provider, MD  isosorbide mononitrate (IMDUR) 60 MG 24 hr tablet Take 1 tablet by mouth daily. 12/08/12  Yes Historical Provider, MD  metoprolol tartrate (LOPRESSOR) 25 MG tablet Take 1 tablet by mouth 2 (two) times daily. 11/17/12  Yes Historical Provider, MD  pantoprazole (PROTONIX) 40 MG tablet Take 40 mg by mouth daily. 11/20/12  Yes Historical Provider, MD  PROAIR HFA 108 (90 BASE) MCG/ACT inhaler Inhale 1 puff into the lungs every 4 (four) hours as needed. For shortness of breath 11/29/12  Yes Historical Provider, MD  theophylline (THEODUR) 200 MG 12 hr tablet Take 200 mg by mouth daily. 12/11/12  Yes Historical Provider, MD  XARELTO 20 MG TABS tablet Take 20 mg by mouth daily. 12/04/12  Yes Historical Provider, MD    Physical Exam: Filed Vitals:   12/15/12 2355 12/16/12 0102 12/16/12 0131 12/16/12 0332  BP:  134/69    Pulse:   115    Temp:      TempSrc:      Resp:  22    Height:  5' 5.5" (1.664 m)    SpO2: 88% 93% 93% 96%    General: Alert, Awake and Oriented to Time, Place and Person. Appear in moderate distress Eyes: PERRL ENT: Oral Mucosa clear moist. Neck: no JVD Cardiovascular: S1 and S2 Present, no Murmur, Peripheral Pulses Present Respiratory: Bilateral Air entry equal and Decreased, no Crackles, bilateral expiratory wheezes Abdomen: Bowel Sound Present, Soft and Non tender Skin: no Rash Extremities:  trace  Pedal edema, no calf tenderness Neurologic: Grossly Unremarkable. Labs on Admission:  CBC:  Recent Labs Lab 12/15/12 2221 12/16/12 0157  WBC 10.0 11.7*  NEUTROABS 7.3  --   HGB 14.2 14.0  HCT 44.9 43.6  MCV 96.1 96.0  PLT 203 198    CMP     Component Value Date/Time   NA 134* 12/16/2012 0157   K 4.0 12/16/2012 0157   CL 90* 12/16/2012 0157   CO2 33* 12/16/2012 0157   GLUCOSE 496* 12/16/2012 0157   BUN 13 12/16/2012 0157   CREATININE 0.55 12/16/2012 0157   CALCIUM 9.8 12/16/2012 0157   PROT 7.8 12/16/2012 0157   ALBUMIN 3.6 12/16/2012 0157   AST 13 12/16/2012 0157   ALT 18 12/16/2012 0157   ALKPHOS 144* 12/16/2012 0157   BILITOT 0.4 12/16/2012 0157   GFRNONAA >90 12/16/2012 0157   GFRAA >90 12/16/2012 0157    No results found for this basename: LIPASE, AMYLASE,  in the last 168 hours No results found for this basename: AMMONIA,  in the last 168 hours   Recent Labs Lab 12/15/12 2227  TROPONINI <0.30   BNP (last 3 results)  Recent Labs  12/15/12 2227  PROBNP 53.5    Radiological Exams on Admission: Dg Chest Port 1 View  12/15/2012   CLINICAL DATA:  Chest pain, cough, chest congestion, shortness of breath.  EXAM: PORTABLE CHEST - 1 VIEW  COMPARISON:  05/25/2005.  FINDINGS: Normal sized heart. Clear lungs. The lungs remain mildly hyperexpanded with mildly prominent interstitial markings. Stable post CABG changes. Diffuse osteopenia.  IMPRESSION: No acute  abnormality. Stable changes of COPD.   Electronically Signed   By: Gordan Payment [name]   On: 12/15/2012 22:41    EKG: Independently reviewed. sinus tachycardia.  Assessment/Plan Principal Problem:   C O P D WITH ACUTE EXACERBATION Active Problems:   HYPERTENSION   CORONARY HEART DISEASE   1. Obstructive chronic bronchitis with exacerbation  the patient is presenting with COPD exacerbation, with complaints of cough shortness of breath and pleuritic chest pain. She has a chest x-ray which is normal. She was initially hypoxic but her symptoms improved with nebulization. At present she is admitted to the hospital we will continue her on IV Solu-Medrol, IV ceftriaxone and IV azithromycin, duo nebs every 4 hour and 2 hour as needed. We will also give  her a flutter wall to clear her secretions. She will from followup with a pulmonologist  2.Hypertension  Continuing metoprolol   3. A. fib  Continues rivaroxaban currently in sinus rhythm ,   4. diabetes mellitus  Placed on sliding scale, hold Amaryl   DVT Prophylaxis: mechanical compression device Nutrition:  cardiac and diabetic diet  Code Status:  who   Family Communication:  family  was present at bedside, opportunity was given to ask question and all questions were answered satisfactorily at the time of interview. Disposition: Admitted to inpatient in telemetry unit.  Author: Lynden Oxford, MD [facility] Pager: [phone] 12/16/2012, 4:53 AM    If 7PM-7AM, please contact night-coverage [email or web address] Password [email or web address]

## 2012-12-16 NOTE — Progress Notes (Signed)
Pt watched 1st diabetic video only today. Asked Dr if she could be on a lung transplant list. Anxious -likes to listen to music not watch TV. Still wearing 02 per N/C in her mouth - likes it that way. Have tried to get to change back to nose but she will not. Husband  with pt most of the day-left after dinner. Restarted  IV due to c/o site being uncomfortable at her wrist.

## 2012-12-16 NOTE — Progress Notes (Signed)
*  PRELIMINARY RESULTS* Echocardiogram 2D Echocardiogram has been performed.  Jeryl Columbia 12/16/2012, 5:10 PM

## 2012-12-16 NOTE — Progress Notes (Signed)
Pt.arrived to the unit from the ED around 0100. She was transported by stretcher with oxygen at 5 L [place] and IV pump. Was told during admission report from ED nurse that patient is dependent on 5 L [place] of oxygen at home. She is ambulatory with 1 person standby assist. Has SOB with execertion. Patient heart rate was elevated sustaining in the  120's-130's and increased to 140's non-sustaning. Pt.was on albuterol breathing treatments so Mid-level provider with [facility] was called and notified. Breathing treatments orders were changed. EKG was also done and reported to on call provider. Pt.had some c/o non-radiating right shoulder pain later in the shift and prn pain medication given.Pt.insists on wearing oxygen nasal cannula in her mouth instead of in her nose as directed. Explained to patient that I would not place her oxygen nasal cannula in her mouth when putting her oxygen on, however patient pulls nasal cannula down into her mouth area.

## 2012-12-16 NOTE — Progress Notes (Signed)
[facility] PROGRESS NOTE  [name] ZOX:[id number] DOB: [birth year] DOA: 12/15/2012 PCP: No PCP Per Patient  Assessment/Plan: COPD exacerbation - baseline poor lung function given need for 5L [place] at home.  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 On Lipitor, Imdur,  DM - wild sugars with underlying diabetes. Patient was also being brought additional food by family in addition to what she is receiving here. Start Lantus 10U, change SSI to resistant. Diabetes educator consulted.  HTN - continue home meds A fib - Xarelto, in SR.  Patient without much medical care as an outpatient and does not seek care often. Has poor insight into her multiple medical problems.   Diet: carb modified Fluids: none DVT Prophylaxis: Xarelto  Code Status: Full Family Communication: husband/son bedside  Disposition Plan: inpatient  Consultants:  none  Procedures:  none   Antibiotics  Anti-infectives   Start     Dose/Rate Route Frequency Ordered Stop   12/17/12 0000  azithromycin (ZITHROMAX) 500 mg in dextrose 5 % 250 mL IVPB     500 mg 250 mL/hr over 60 Minutes Intravenous Every 24 hours 12/16/12 0044     12/16/12 2200  cefTRIAXone (ROCEPHIN) 1 g in dextrose 5 % 50 mL IVPB     1 g 100 mL/hr over 30 Minutes Intravenous Every 24 hours 12/16/12 0045     12/16/12 0000  cefTRIAXone (ROCEPHIN) 1 g in dextrose 5 % 50 mL IVPB     1 g 100 mL/hr over 30 Minutes Intravenous  Once 12/15/12 2348 12/16/12 0029   12/16/12 0000  azithromycin (ZITHROMAX) 500 mg in dextrose 5 % 250 mL IVPB     500 mg 250 mL/hr over 60 Minutes Intravenous  Once 12/15/12 2348 12/16/12 0128     Antibiotics Given (last 72 hours)   None      HPI/Subjective: - better than last night but still short winded  Objective: Filed Vitals:   12/16/12 0531 12/16/12 0804 12/16/12 0940 12/16/12 0949  BP: 118/64  122/68 133/65   Pulse: 85  88 95  Temp: 98.5 F (36.9 C)   97.7 F (36.5 C)  TempSrc: Oral   Axillary  Resp: 22   22  Height: 5\' 6"  (1.676 m)     Weight: 104.3 kg (229 lb 15 oz)     SpO2: 95% 95%  98%    Intake/Output Summary (Last 24 hours) at 12/16/12 1336 Last data filed at 12/16/12 0154  Gross per 24 hour  Intake    543 ml  Output      0 ml  Net    543 ml   Filed Weights   12/16/12 0531  Weight: 104.3 kg (229 lb 15 oz)    Exam:   General:  NAD  Cardiovascular: regular rate and rhythm, without MRG  Respiratory: very poor breath sounds, minimal wheezing.   Abdomen: soft, not tender to palpation, positive bowel sounds  MSK: no peripheral edema  Neuro: non focal  Data Reviewed: Basic Metabolic Panel:  Recent Labs Lab 12/15/12 2221 12/16/12 0157  NA 138 134*  K 4.1 4.0  CL 94* 90*  CO2 36* 33*  GLUCOSE 346* 496*  BUN 11 13  CREATININE 0.49* 0.55  CALCIUM 9.8 9.8   Liver Function Tests:  Recent Labs Lab 12/15/12 2221 12/16/12 0157  AST 12 13  ALT 15 18  ALKPHOS 121* 144*  BILITOT 0.5  0.4  PROT 7.5 7.8  ALBUMIN 3.5 3.6   CBC:  Recent Labs Lab 12/15/12 2221 12/16/12 0157  WBC 10.0 11.7*  NEUTROABS 7.3  --   HGB 14.2 14.0  HCT 44.9 43.6  MCV 96.1 96.0  PLT 203 198   Cardiac Enzymes:  Recent Labs Lab 12/15/12 2227  TROPONINI <0.30   BNP (last 3 results)  Recent Labs  12/15/12 2227  PROBNP 53.5   CBG:  Recent Labs Lab 12/16/12 0202 12/16/12 0632 12/16/12 1117  GLUCAP 442* 297* 319*   Studies: Dg Chest Port 1 View  12/15/2012   CLINICAL DATA:  Chest pain, cough, chest congestion, shortness of breath.  EXAM: PORTABLE CHEST - 1 VIEW  COMPARISON:  05/25/2005.  FINDINGS: Normal sized heart. Clear lungs. The lungs remain mildly hyperexpanded with mildly prominent interstitial markings. Stable post CABG changes. Diffuse osteopenia.  IMPRESSION: No acute abnormality. Stable changes of COPD.   Electronically Signed   By: Gordan Payment [name]    On: 12/15/2012 22:41   Scheduled Meds: . aspirin EC  81 mg Oral Daily  . atorvastatin  20 mg Oral Daily  . [START ON 12/17/2012] azithromycin  500 mg Intravenous Q24H  . cefTRIAXone (ROCEPHIN)  IV  1 g Intravenous Q24H  . guaiFENesin  600 mg Oral BID  . insulin aspart  0-15 Units Subcutaneous TID WC  . insulin aspart  0-5 Units Subcutaneous QHS  . insulin glargine  10 Units Subcutaneous Daily  . ipratropium  0.5 mg Nebulization Q4H  . levalbuterol  0.63 mg Nebulization Q4H  . methylPREDNISolone (SOLU-MEDROL) injection  60 mg Intravenous Q6H  . metoprolol tartrate  25 mg Oral BID  . pantoprazole  40 mg Oral Daily  . Rivaroxaban  20 mg Oral Q supper  . sodium chloride  3 mL Intravenous Q12H  . theophylline  200 mg Oral Daily   Continuous Infusions:   Principal Problem:   C O P D WITH ACUTE EXACERBATION Active Problems:   HYPERTENSION   CORONARY HEART DISEASE  Time spent: 27  Pamella Pert, MD [facility] Pager [phone]. If 7 PM - 7 AM, please contact night-coverage at [email or web address], password [facility] 12/16/2012, 1:36 PM  LOS: 1 day

## 2012-12-16 NOTE — ED Notes (Signed)
Patel MD at bedside.

## 2012-12-16 NOTE — Progress Notes (Signed)
Nutrition Brief Note  RD consulted for DM diet education.  Patient not appropriate for education at this time.  Patient with towel on forehead; feeling nauseous.  RD to re-visit at later date.

## 2012-12-16 NOTE — Progress Notes (Signed)
Inpatient Diabetes Program Recommendations  AACE/ADA: New Consensus Statement on Inpatient Glycemic Control (2013)  Target Ranges:  Prepandial:   less than 140 mg/dL      Peak postprandial:   less than 180 mg/dL (1-2 hours)      Critically ill patients:  140 - 180 mg/dL   Reason for Visit: Results for [name], [name] (MRN [id number]) as of 12/16/2012 10:06  Ref. Range 12/16/2012 02:02 12/16/2012 06:32  Glucose-Capillary Latest Range: 70-99 mg/dL 409 (H) 811 (H)   Note history of diabetes.  Patient is currently on IV steroids.  Please consider adding basal insulin, Levemir 25 units daily while in the hospital and Novolog meal coverage 5 units tid with meals.  Also please check A1C to determine pre-hospitalization glycemic control.  Will follow. Beryl Meager, RN, BC-ADM Inpatient Diabetes Coordinator Pager [phone]

## 2012-12-16 NOTE — Progress Notes (Signed)
Pt was ordered Albuterol/Atrovent Q4H. Due to pt's HR of 132, Xopenex 0.63mg  is being substituted for Albuterol. RT will continue to monitor.

## 2012-12-17 LAB — GLUCOSE, CAPILLARY
Glucose-Capillary: 295 mg/dL — ABNORMAL HIGH (ref 70–99)
Glucose-Capillary: 377 mg/dL — ABNORMAL HIGH (ref 70–99)
Glucose-Capillary: 338 mg/dL — ABNORMAL HIGH (ref 70–99)
Glucose-Capillary: 294 mg/dL — ABNORMAL HIGH (ref 70–99)

## 2012-12-17 LAB — CBC
HCT: 41.8 % (ref 36.0–46.0)
MCHC: 31.6 g/dL (ref 30.0–36.0)
MCV: 94.1 fL (ref 78.0–100.0)
Platelets: 238 10*3/uL (ref 150–400)
RDW: 13.4 % (ref 11.5–15.5)
WBC: 14.3 10*3/uL — ABNORMAL HIGH (ref 4.0–10.5)

## 2012-12-17 LAB — BASIC METABOLIC PANEL
CO2: 33 mEq/L — ABNORMAL HIGH (ref 19–32)
Calcium: 9.5 mg/dL (ref 8.4–10.5)
Chloride: 94 mEq/L — ABNORMAL LOW (ref 96–112)
Creatinine, Ser: 0.55 mg/dL (ref 0.50–1.10)
GFR calc non Af Amer: >90 mL/min (ref >90)

## 2012-12-17 LAB — EXPECTORATED SPUTUM ASSESSMENT W GRAM STAIN, RFLX TO RESP C

## 2012-12-17 MED ORDER — INSULIN ASPART 100 UNIT/ML ~~LOC~~ SOLN
3.0000 [IU] | Freq: Three times a day (TID) | SUBCUTANEOUS | Status: DC
  Administered 2012-12-17 – 2012-12-18 (×4): 3 [IU] via SUBCUTANEOUS

## 2012-12-17 MED ORDER — PREDNISONE 50 MG PO TABS
50.0000 mg | ORAL_TABLET | Freq: Every day | ORAL | Status: DC
  Administered 2012-12-17 – 2012-12-18 (×2): 50 mg via ORAL
  Filled 2012-12-17 (×3): qty 1

## 2012-12-17 MED ORDER — INSULIN GLARGINE 100 UNIT/ML ~~LOC~~ SOLN: 15.0000 [IU] | Freq: Every day | SUBCUTANEOUS | Status: DC

## 2012-12-17 MED ORDER — INSULIN GLARGINE 100 UNIT/ML ~~LOC~~ SOLN
15.0000 [IU] | Freq: Every day | SUBCUTANEOUS | Status: DC
  Administered 2012-12-18: 10:00:00 15 [IU] via SUBCUTANEOUS
  Filled 2012-12-17: qty 0.15

## 2012-12-17 MED ORDER — INSULIN PEN STARTER KIT
1.0000 | Freq: Once | Status: AC
  Administered 2012-12-17: 12:00:00 1
  Filled 2012-12-17: qty 1

## 2012-12-17 MED ORDER — LIVING WELL WITH DIABETES BOOK
Freq: Once | Status: AC
  Administered 2012-12-17: 12:00:00 1
  Filled 2012-12-17: qty 1

## 2012-12-17 MED ORDER — AZITHROMYCIN 500 MG PO TABS
500.0000 mg | ORAL_TABLET | Freq: Every day | ORAL | Status: DC
  Administered 2012-12-17 – 2012-12-18 (×2): 500 mg via ORAL
  Filled 2012-12-17 (×2): qty 1

## 2012-12-17 NOTE — Progress Notes (Signed)
Utilization Review Completed Sherod Cisse J. Leroi Haque, RN, BSN, NCM [phone]  

## 2012-12-17 NOTE — Progress Notes (Signed)
Pt. C/o tightness in L calf. BLE assessed by RN. +2 edema noted in BLE. Calves equal in size bilaterally, dry and warm to touch. On call MD notified. No new orders received. RN will continue to monitor pt. For changes in condition. Afifa Truax, Cheryll Dessert

## 2012-12-17 NOTE — Progress Notes (Addendum)
Pt O4x, on complaints of SOB, 5L of O2, pt wears in month. Pt states discomfort in Lt leg d/t swelling. Sputum cup given. Will continue to monitor

## 2012-12-17 NOTE — Progress Notes (Addendum)
Pt stated being SOB, sch ned txt given. Pt states on mild improvement O2 93%, Pt also appears anxiuos, my benefit form antianxiety. Pt states prednisone puts her "on edge".  will continue to monitor

## 2012-12-17 NOTE — Plan of Care (Signed)
Problem: Food- and Nutrition-Related Knowledge Deficit (NB-1.1) Goal: Nutrition education Formal process to instruct or train a patient/client in a skill or to impart knowledge to help patients/clients voluntarily manage or modify food choices and eating behavior to maintain or improve health. Outcome: Completed/Met Date Met:  12/17/12  RD consulted for nutrition education regarding diabetes.     Lab Results  Component Value Date    HGBA1C 9.2* 12/16/2012    RD provided "Balanced Plate" handout. Discussed different food groups and their effects on blood sugar, emphasizing carbohydrate-containing foods. Provided list of carbohydrates and recommended serving sizes of common foods.  Discussed importance of controlled and consistent carbohydrate intake throughout the day. Provided examples of ways to balance meals/snacks and encouraged intake of high-fiber, whole grain complex carbohydrates. Teach back method used.  Expect poor compliance.  Body mass index is 37.1 kg/(m^2). Pt meets criteria for Obese Class II based on current BMI.  Current diet order is Carbohydrate Modified, patient is consuming approximately 75% of meals at this time. Labs and medications reviewed. No further nutrition interventions warranted at this time. RD contact information provided. If additional nutrition issues arise, please re-consult RD.  Jarold Motto MS, RD, LDN Pager: [phone] After-hours pager: [phone]

## 2012-12-17 NOTE — Progress Notes (Signed)
Inpatient Diabetes Program Recommendations  AACE/ADA: New Consensus Statement on Inpatient Glycemic Control (2013)  Target Ranges:  Prepandial:   less than 140 mg/dL      Peak postprandial:   less than 180 mg/dL (1-2 hours)      Critically ill patients:  140 - 180 mg/dL   Reason for Visit: Hyperglycemia  Pt states she monitors blood sugars at home.  Takes Amaryl 2 mg/day.  Needs PCP to manage DM when discharged.  Discussed importance of controlling blood sugars to prevent long-term complications.  Discussed starting insulin and pt requests pen for home instead of syringe.  States she cannot get any exercise because she gets SOB.  Has made some dietary changes but knows she can improve in this area.  Will order insulin pen starter kit and Living Well With Diabetes book.  Encouraged pt to view diabetes videos on pt ed channel.   Results for Kelli, Mills (MRN [id number]) as of 12/17/2012 16:27  Ref. Range 12/16/2012 11:17 12/16/2012 16:07 12/16/2012 22:03 12/17/2012 06:10 12/17/2012 11:35  Glucose-Capillary Latest Range: 70-99 mg/dL 409 (H) 811 (H) 914 (H) 295 (H) 377 (H)   Continues with hyperglycemia.  Will need increase in basal and bolus insulin.  Inpatient Diabetes Program Recommendations Insulin - Basal: Increase Lantus to 25 units QD Insulin - Meal Coverage: Increase Novolog to 6 units tidwc Outpatient Referral: Will order OP Diabetes Education consult for uncontrolled DM  Note: Will teach insulin pen administration in am.  Pt needs much encouragement and support.  Case management consult to check on insurance coverage for insulin pen.  Will continue to follow.  Thank you. Ailene Ards, RD, LDN, CDE Inpatient Diabetes Coordinator [phone]

## 2012-12-17 NOTE — Progress Notes (Signed)
Coughed up sputum obtained/sent to lab.

## 2012-12-17 NOTE — Progress Notes (Signed)
[facility] PROGRESS NOTE  [name] NWG:[id number] DOB: August 20, 1955 DOA: 12/15/2012 PCP: No PCP Per Patient  Assessment/Plan: COPD exacerbation - baseline poor lung function given need for 5L [place] at home.  - continue breathing treatments, antibiotics, steroids. Transitioned to oral agents today.  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 On Lipitor, Imdur,  DM - wild sugars with underlying diabetes. Patient was also being brought additional food by family in addition to what she is receiving here. Start Lantus 10U, change SSI to resistant. Diabetes educator consulted.  - added 3U scheduled this morning.  HTN - continue home meds A fib - Xarelto, in SR.  Patient without much medical care as an outpatient and does not seek care often. Has poor insight into her multiple medical problems.   Diet: carb modified Fluids: none DVT Prophylaxis: Xarelto  Code Status: Full Family Communication: husband/son bedside  Disposition Plan: inpatient  Consultants:  none  Procedures:  2D echo Study Conclusions  - Left ventricle: The cavity size was normal. Systolic function was normal. The estimated ejection fraction was in the range of 55% to 65%. Although no diagnostic regional wall motion abnormality was identified, this possibility cannot be completely excluded on the basis of this study. Doppler parameters are consistent with abnormal left ventricular relaxation (grade 1 diastolic dysfunction). - Right ventricle: The cavity size was mildly dilated. Wall thickness was normal.   Antibiotics  Anti-infectives   Start     Dose/Rate Route Frequency Ordered Stop   12/17/12 0000  azithromycin (ZITHROMAX) 500 mg in dextrose 5 % 250 mL IVPB     500 mg 250 mL/hr over 60 Minutes Intravenous Every 24 hours 12/16/12 0044     12/16/12 2200  cefTRIAXone (ROCEPHIN) 1 g in dextrose 5 % 50 mL IVPB     1 g 100 mL/hr over 30 Minutes Intravenous Every  24 hours 12/16/12 0045     12/16/12 0000  cefTRIAXone (ROCEPHIN) 1 g in dextrose 5 % 50 mL IVPB     1 g 100 mL/hr over 30 Minutes Intravenous  Once 12/15/12 2348 12/16/12 0029   12/16/12 0000  azithromycin (ZITHROMAX) 500 mg in dextrose 5 % 250 mL IVPB     500 mg 250 mL/hr over 60 Minutes Intravenous  Once 12/15/12 2348 12/16/12 0128     Antibiotics Given (last 72 hours)   Date/Time Action Medication Dose Rate   12/16/12 2148 Given   cefTRIAXone (ROCEPHIN) 1 g in dextrose 5 % 50 mL IVPB 1 g 100 mL/hr   12/16/12 2351 Given   azithromycin (ZITHROMAX) 500 mg in dextrose 5 % 250 mL IVPB 500 mg 250 mL/hr      HPI/Subjective: - better than last night but still short winded  Objective: Filed Vitals:   12/17/12 0433 12/17/12 0520 12/17/12 0900 12/17/12 0945  BP:  101/56 120/76   Pulse:  90 106   Temp:  97.1 F (36.2 C)    TempSrc:  Oral    Resp:  20 20   Height:      Weight:  104.2 kg (229 lb 11.5 oz)    SpO2: 95% 92% 90% 94%    Intake/Output Summary (Last 24 hours) at 12/17/12 0954 Last data filed at 12/17/12 0900  Gross per 24 hour  Intake   1620 ml  Output   1000 ml  Net    620 ml   Filed Weights   12/16/12 0531 12/17/12 0520  Weight: 104.3 kg (229 lb 15 oz) 104.2 kg (229 lb 11.5 oz)    Exam:   General:  NAD  Cardiovascular: regular rate and rhythm, without MRG  Respiratory: very poor breath sounds, minimal wheezing.   Abdomen: soft, not tender to palpation, positive bowel sounds  MSK: no peripheral edema  Neuro: non focal  Data Reviewed: Basic Metabolic Panel:  Recent Labs Lab 12/15/12 2221 12/16/12 0157 12/17/12 0827  NA 138 134* 135  K 4.1 4.0 4.5  CL 94* 90* 94*  CO2 36* 33* 33*  GLUCOSE 346* 496* 360*  BUN 11 13 16   CREATININE 0.49* 0.55 0.55  CALCIUM 9.8 9.8 9.5   Liver Function Tests:  Recent Labs Lab 12/15/12 2221 12/16/12 0157  AST 12 13  ALT 15 18  ALKPHOS 121* 144*  BILITOT 0.5 0.4  PROT 7.5 7.8  ALBUMIN 3.5 3.6    CBC:  Recent Labs Lab 12/15/12 2221 12/16/12 0157 12/17/12 0827  WBC 10.0 11.7* 14.3*  NEUTROABS 7.3  --   --   HGB 14.2 14.0 13.2  HCT 44.9 43.6 41.8  MCV 96.1 96.0 94.1  PLT 203 198 238   Cardiac Enzymes:  Recent Labs Lab 12/15/12 2227 12/16/12 1238 12/16/12 2030  TROPONINI <0.30 <0.30 <0.30   BNP (last 3 results)  Recent Labs  12/15/12 2227  PROBNP 53.5   CBG:  Recent Labs Lab 12/16/12 0632 12/16/12 1117 12/16/12 1607 12/16/12 2203 12/17/12 0610  GLUCAP 297* 319* 241* 373* 295*   Studies: Dg Chest Port 1 View  12/15/2012   CLINICAL DATA:  Chest pain, cough, chest congestion, shortness of breath.  EXAM: PORTABLE CHEST - 1 VIEW  COMPARISON:  05/25/2005.  FINDINGS: Normal sized heart. Clear lungs. The lungs remain mildly hyperexpanded with mildly prominent interstitial markings. Stable post CABG changes. Diffuse osteopenia.  IMPRESSION: No acute abnormality. Stable changes of COPD.   Electronically Signed   By: Gordan Payment [name]   On: 12/15/2012 22:41   Scheduled Meds: . aspirin EC  81 mg Oral Daily  . atorvastatin  20 mg Oral Daily  . azithromycin  500 mg Intravenous Q24H  . cefTRIAXone (ROCEPHIN)  IV  1 g Intravenous Q24H  . guaiFENesin  600 mg Oral BID  . insulin aspart  0-20 Units Subcutaneous TID WC  . insulin aspart  0-5 Units Subcutaneous QHS  . insulin glargine  10 Units Subcutaneous Daily  . ipratropium  0.5 mg Nebulization Q4H  . isosorbide mononitrate  60 mg Oral Daily  . levalbuterol  0.63 mg Nebulization Q4H  . methylPREDNISolone (SOLU-MEDROL) injection  60 mg Intravenous Q6H  . metoprolol tartrate  25 mg Oral BID  . pantoprazole  40 mg Oral Daily  . Rivaroxaban  20 mg Oral Q supper  . sodium chloride  3 mL Intravenous Q12H  . theophylline  200 mg Oral Daily   Continuous Infusions:   Principal Problem:   C O P D WITH ACUTE EXACERBATION Active Problems:   HYPERTENSION   CORONARY HEART DISEASE  Time spent: 29  Pamella Pert,  MD [facility] Pager [phone]. If 7 PM - 7 AM, please contact night-coverage at [email or web address], password [facility] 12/17/2012, 9:54 AM  LOS: 2 days

## 2012-12-17 NOTE — Progress Notes (Signed)
DM video #[id number] shown to Pt: DM coordinate page to further educate PT.

## 2012-12-18 LAB — GLUCOSE, CAPILLARY: Glucose-Capillary: 235 mg/dL — ABNORMAL HIGH (ref 70–99)

## 2012-12-18 MED ORDER — SIMETHICONE 80 MG PO CHEW: 80.0000 mg | CHEWABLE_TABLET | Freq: Four times a day (QID) | ORAL | Status: DC | PRN

## 2012-12-18 MED ORDER — INSULIN GLARGINE 100 UNIT/ML SOLOSTAR PEN: 20.0000 [IU] | PEN_INJECTOR | Freq: Every morning | SUBCUTANEOUS | Status: DC

## 2012-12-18 MED ORDER — PREDNISONE 20 MG PO TABS: 20.0000 mg | ORAL_TABLET | Freq: Every day | ORAL | Status: DC

## 2012-12-18 MED ORDER — INSULIN ASPART 100 UNIT/ML FLEXPEN: 6.0000 [IU] | PEN_INJECTOR | Freq: Three times a day (TID) | SUBCUTANEOUS | Status: AC

## 2012-12-18 MED ORDER — AZITHROMYCIN 500 MG PO TABS: 500.0000 mg | ORAL_TABLET | Freq: Every day | ORAL | Status: DC

## 2012-12-18 MED ORDER — HYDROCODONE-ACETAMINOPHEN 7.5-300 MG PO TABS: 1.0000 | ORAL_TABLET | Freq: Four times a day (QID) | ORAL | Status: DC | PRN

## 2012-12-18 MED ORDER — INSULIN PEN STARTER KIT
1.0000 | Freq: Once | Status: AC
  Administered 2012-12-18: 12:00:00 1
  Filled 2012-12-18: qty 1

## 2012-12-18 MED ORDER — INSULIN PEN NEEDLE 32G X 4 MM MISC: 1.0000 | Status: DC | PRN

## 2012-12-18 NOTE — Progress Notes (Signed)
Inpatient Diabetes Program Recommendations  AACE/ADA: New Consensus Statement on Inpatient Glycemic Control (2013)  Target Ranges:  Prepandial:   less than 140 mg/dL      Peak postprandial:   less than 180 mg/dL (1-2 hours)      Critically ill patients:  140 - 180 mg/dL   Reason for Visit: Hyperglycemia, Insulin Pen Administration Teaching  Results for [name], [name] (MRN [id number]) as of 12/18/2012 12:32  Ref. Range 12/17/2012 11:35 12/17/2012 16:10 12/17/2012 20:56 12/18/2012 05:59 12/18/2012 11:08  Glucose-Capillary Latest Range: 70-99 mg/dL 213 (H) 086 (H) 578 (H) 257 (H) 235 (H)    Inpatient Diabetes Program Recommendations Insulin - Basal: Increase Lantus to 25 units QD Insulin - Meal Coverage: Increase Novolog to 6 units tidwc Outpatient Referral: Will order OP Diabetes Education consult for uncontrolled DM  Recommendations for Discharge Increase Lantus to 20 units QD Increase Novolog to 6 units tidwc for meal coverage insulin. Increase Amaryl to 4 mg QDAC  Instructed pt and husband on insulin pen administration.  Return demonstration was adequate.  Discussed hypoglycemia s/s and treatment, importance of portion control and high fiber foods instead of low fiber.  Long discussion regarding importance of glucose control and monitoring at home.  Instructed to take meter to MD appt for adjustments to insulin. Explained that [facility] would call pt to set up appt for OP Diabetes Education. Has f/u appt with endocrinologist in [facility] on 01/01/2013.  Discussed with Dr. Elvera Lennox. Thank you. Ailene Ards, RD, LDN, CDE Inpatient Diabetes Coordinator [phone]

## 2012-12-18 NOTE — Progress Notes (Signed)
DC IV, Dc Tele, DC Home. Discharge instructions and home medications discussed with patient and patient's family member. Patient and family denied any questions or concerns at this time. Patient leaving unit via wheelchair and appears in no acute distress.

## 2012-12-19 LAB — GLUCOSE, CAPILLARY: Glucose-Capillary: 300 mg/dL — ABNORMAL HIGH (ref 70–99)

## 2012-12-19 LAB — CULTURE, RESPIRATORY W GRAM STAIN

## 2012-12-19 LAB — CULTURE, RESPIRATORY: Culture: NORMAL

## 2012-12-20 DIAGNOSIS — J9610 Chronic respiratory failure, unspecified whether with hypoxia or hypercapnia: Secondary | ICD-10-CM | POA: Diagnosis present

## 2012-12-20 DIAGNOSIS — E118 Type 2 diabetes mellitus with unspecified complications: Secondary | ICD-10-CM | POA: Diagnosis present

## 2012-12-20 DIAGNOSIS — I4891 Unspecified atrial fibrillation: Secondary | ICD-10-CM | POA: Diagnosis present

## 2012-12-20 NOTE — Discharge Summary (Addendum)
Physician Discharge Summary  [name] YNW:[id number] DOB: December 18, 1955 DOA: 12/15/2012  PCP: No PCP Per Patient  Admit date: 12/15/2012 Discharge date: 12/20/2012  Time spent: 35 minutes  Recommendations for Outpatient Follow-up:  1. Follow up with PCP in 1 week 2. Follow up with Endocrinology later this month   Recommendations for primary care physician for things to follow:  CBG, HTN control  Discharge Diagnoses:  Principal Problem:   C O P D WITH ACUTE EXACERBATION Active Problems:   HYPERTENSION   CORONARY HEART DISEASE   OXYGEN-USE OF SUPPLEMENTAL   Diabetes mellitus type 2, uncontrolled   Atrial fibrillation   Acute-on-chronic respiratory failure  Discharge Condition: stable  Diet recommendation: diabetic, heart healthy  Filed Weights   12/16/12 0531 12/17/12 0520 12/18/12 0550  Weight: 104.3 kg (229 lb 15 oz) 104.2 kg (229 lb 11.5 oz) 104.8 kg (231 lb 0.7 oz)    History of present illness:  [name] is a 57 y.o. female with Past medical history of COPD, diabetes on the, coronary artery disease, hypertension. The patient is coming from home. The patient presented with complaints of shortness of breath that initiated today.she mentions that she has history of shortness of breath on exertion and hersymptoms have been progressively getting worse over last few months primary since last to 3 days her symptoms her progress significantly. currently she mentions that since she woke up today she has been short of breath, by the afternoon her symptoms were severe that she called EMS. EMS found her to be hypoxic in the high 80s on her thyroid is of oxygen.at the time of my evaluation in the ED she was on 5 L and saturating 97%.  The patient also mentions that initially she felt some chest tightness which is resolved right now. She did not have any complaints of fever, chills, palpitation, diaphoresis, nausea, diarrhea, active bleeding, burning urination, leg swelling.  She is  compliant with all her medication and has quit smoking 3 years ago. she does not have a pulmonologist .    Hospital Course:  COPD exacerbation - baseline poor lung function given need for 5L [place] at home. Patent was initially on IV steroids and IV antibiotics which were transitioned to otral agents upon her clinical improvement. She was discharged to finish a course or oral Azithromycin and a short prednisone taper.  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 On Lipitor, Imdur,  DM - wild sugars with underlying diabetes. Elevated A1C to 9.2 suggesting poor control at home. She was started on insulin as outlined below in her medication list with decent control of her sugars. She is to follow up with her PCP in 1 week and was instructed to create and maintain a CBG log to accurately allow her PCP to further adjust her insulin regimen if needed. She stated that she is also supposed to see an endocrinologist soon.  HTN - continue home meds  A fib - Xarelto, in SR.  Procedures:  none   Consultations:  none  Discharge Exam: Filed Vitals:   12/18/12 1013 12/18/12 1015 12/18/12 1143 12/18/12 1300  BP: 131/74 131/74  117/51  Pulse: 97 97  94  Temp: 98 F (36.7 C)   97 F (36.1 C)  TempSrc: Oral   Oral  Resp: 16   18  Height:      Weight:      SpO2: 95%  96% 95%    General:  NAD Cardiovascular: RRR Respiratory: distant breath sounds, no wheezing.   Discharge Instructions  Discharge Orders   Future Orders Complete By Expires   Ambulatory referral to Nutrition and Diabetic Education  As directed        Medication List    STOP taking these medications       glimepiride 2 MG tablet  Commonly known as:  AMARYL      TAKE these medications       albuterol-ipratropium 18-103 MCG/ACT inhaler  Commonly known as:  COMBIVENT  Inhale 1-2 puffs into the lungs every 4 (four) hours as needed for wheezing or shortness of breath.      ipratropium-albuterol 0.5-2.5 (3) MG/3ML Soln  Commonly known as:  DUONEB  Take 3 mLs by nebulization every 4 (four) hours as needed (for shortness of breath).     aspirin EC 81 MG tablet  Take 81 mg by mouth daily.     atorvastatin 20 MG tablet  Commonly known as:  LIPITOR  Take 1 tablet by mouth daily.     azithromycin 500 MG tablet  Commonly known as:  ZITHROMAX  Take 1 tablet (500 mg total) by mouth daily.     Hydrocodone-Acetaminophen 7.5-300 MG Tabs  Take 1 tablet by mouth every 6 (six) hours as needed. For pain     insulin aspart 100 UNIT/ML Sopn FlexPen  Commonly known as:  NOVOLOG FLEXPEN  Inject 6 Units into the skin 3 (three) times daily with meals.     Insulin Glargine 100 UNIT/ML Sopn  Commonly known as:  LANTUS SOLOSTAR  Inject 20 Units into the skin every morning.     Insulin Pen Needle 32G X 4 MM Misc  1 each by Does not apply route as needed.     isosorbide mononitrate 60 MG 24 hr tablet  Commonly known as:  IMDUR  Take 1 tablet by mouth daily.     metoprolol tartrate 25 MG tablet  Commonly known as:  LOPRESSOR  Take 1 tablet by mouth 2 (two) times daily.     pantoprazole 40 MG tablet  Commonly known as:  PROTONIX  Take 40 mg by mouth daily.     predniSONE 20 MG tablet  Commonly known as:  DELTASONE  Take 1 tablet (20 mg total) by mouth daily with breakfast. 1 tab daily for 3 days then half tab for 4 days then stop     PROAIR HFA 108 (90 BASE) MCG/ACT inhaler  Generic drug:  albuterol  Inhale 1 puff into the lungs every 4 (four) hours as needed. For shortness of breath     simethicone 80 MG chewable tablet  Commonly known as:  MYLICON  Chew 1 tablet (80 mg total) by mouth every 6 (six) hours as needed for flatulence.     theophylline 200 MG 12 hr tablet  Commonly known as:  THEODUR  Take 200 mg by mouth daily.     XARELTO 20 MG Tabs tablet  Generic drug:  Rivaroxaban  Take 20 mg by mouth daily.           Follow-up Information   Follow  up with PCP. Schedule an appointment as soon as possible for a visit in 1 week.      The results of significant diagnostics from this hospitalization (including imaging, microbiology, ancillary and laboratory) are listed below for reference.    Significant Diagnostic Studies: Dg Chest Port 1 View  12/15/2012   CLINICAL DATA:  Chest pain, cough, chest congestion,  shortness of breath.  EXAM: PORTABLE CHEST - 1 VIEW  COMPARISON:  05/25/2005.  FINDINGS: Normal sized heart. Clear lungs. The lungs remain mildly hyperexpanded with mildly prominent interstitial markings. Stable post CABG changes. Diffuse osteopenia.  IMPRESSION: No acute abnormality. Stable changes of COPD.   Electronically Signed   By: Gordan Payment [name]   On: 12/15/2012 22:41    Microbiology: Recent Results (from the past 240 hour(s))  CULTURE, BLOOD (ROUTINE X 2)     Status: None   Collection Time    12/16/12  1:45 AM      Result Value Range Status   Specimen Description BLOOD RIGHT ARM   Final   Special Requests BOTTLES DRAWN AEROBIC AND ANAEROBIC 5CC EA   Final   Culture  Setup Time     Final   Value: 12/16/2012 08:53     Performed at [facility]   Culture     Final   Value:        BLOOD CULTURE RECEIVED NO GROWTH TO DATE CULTURE WILL BE HELD FOR 5 DAYS BEFORE ISSUING A FINAL NEGATIVE REPORT     Performed at [facility]   Report Status PENDING   Incomplete  CULTURE, BLOOD (ROUTINE X 2)     Status: None   Collection Time    12/16/12  1:54 AM      Result Value Range Status   Specimen Description BLOOD RIGHT HAND   Final   Special Requests BOTTLES DRAWN AEROBIC AND ANAEROBIC 10CC EA   Final   Culture  Setup Time     Final   Value: 12/16/2012 08:53     Performed at [facility]   Culture     Final   Value:        BLOOD CULTURE RECEIVED NO GROWTH TO DATE CULTURE WILL BE HELD FOR 5 DAYS BEFORE ISSUING A FINAL NEGATIVE REPORT     Performed at [facility]   Report Status PENDING    Incomplete  CULTURE, EXPECTORATED SPUTUM-ASSESSMENT     Status: None   Collection Time    12/17/12  2:24 PM      Result Value Range Status   Specimen Description SPUTUM   Final   Special Requests NONE   Final   Sputum evaluation     Final   Value: THIS SPECIMEN IS ACCEPTABLE. RESPIRATORY CULTURE REPORT TO FOLLOW.   Report Status 12/17/2012 FINAL   Final  CULTURE, RESPIRATORY (NON-EXPECTORATED)     Status: None   Collection Time    12/17/12  2:24 PM      Result Value Range Status   Specimen Description SPUTUM   Final   Special Requests NONE   Final   Gram Stain     Final   Value: RARE WBC PRESENT, PREDOMINANTLY PMN     RARE SQUAMOUS EPITHELIAL CELLS PRESENT     RARE GRAM POSITIVE COCCI     IN PAIRS IN CHAINS     Performed at [facility]   Culture     Final   Value: NORMAL OROPHARYNGEAL FLORA     Performed at [facility]   Report Status 12/19/2012 FINAL   Final     Labs: Basic Metabolic Panel:  Recent Labs Lab 12/15/12 2221 12/16/12 0157 12/17/12 0827  NA 138 134* 135  K 4.1 4.0 4.5  CL 94* 90* 94*  CO2 36* 33* 33*  GLUCOSE 346* 496* 360*  BUN 11 13 16  CREATININE 0.49* 0.55 0.55  CALCIUM 9.8 9.8 9.5   Liver Function Tests:  Recent Labs Lab 12/15/12 2221 12/16/12 0157  AST 12 13  ALT 15 18  ALKPHOS 121* 144*  BILITOT 0.5 0.4  PROT 7.5 7.8  ALBUMIN 3.5 3.6   CBC:  Recent Labs Lab 12/15/12 2221 12/16/12 0157 12/17/12 0827  WBC 10.0 11.7* 14.3*  NEUTROABS 7.3  --   --   HGB 14.2 14.0 13.2  HCT 44.9 43.6 41.8  MCV 96.1 96.0 94.1  PLT 203 198 238   Cardiac Enzymes:  Recent Labs Lab 12/15/12 2227 12/16/12 1238 12/16/12 2030  TROPONINI <0.30 <0.30 <0.30   BNP: BNP (last 3 results)  Recent Labs  12/15/12 2227  PROBNP 53.5   CBG:  Recent Labs Lab 12/17/12 1610 12/17/12 2056 12/18/12 0559 12/18/12 1108 12/18/12 1555  GLUCAP 338* 294* 257* 235* 300*       Signed:  GHERGHE, COSTIN  [facility] 12/20/2012, 2:47 PM

## 2012-12-22 LAB — CULTURE, BLOOD (ROUTINE X 2): Culture: NO GROWTH

## 2013-02-25 ENCOUNTER — Encounter: Payer: Self-pay | Admitting: Critical Care Medicine

## 2013-02-25 ENCOUNTER — Ambulatory Visit (INDEPENDENT_AMBULATORY_CARE_PROVIDER_SITE_OTHER): Payer: 59 | Admitting: Critical Care Medicine

## 2013-02-25 VITALS — BP 110/70 | HR 99 | Temp 97.7°F | Ht 66.5 in | Wt 231.0 lb

## 2013-02-25 DIAGNOSIS — J441 Chronic obstructive pulmonary disease with (acute) exacerbation: Secondary | ICD-10-CM

## 2013-02-25 DIAGNOSIS — J9610 Chronic respiratory failure, unspecified whether with hypoxia or hypercapnia: Secondary | ICD-10-CM

## 2013-02-25 MED ORDER — PREDNISONE 10 MG PO TABS: ORAL_TABLET | ORAL | Status: DC

## 2013-02-25 MED ORDER — THEOPHYLLINE ER 200 MG PO TB12: 200.0000 mg | ORAL_TABLET | Freq: Two times a day (BID) | ORAL | Status: DC

## 2013-02-25 MED ORDER — ATORVASTATIN CALCIUM 20 MG PO TABS: 20.0000 mg | ORAL_TABLET | Freq: Every day | ORAL | Status: AC

## 2013-02-25 MED ORDER — HYDROCODONE-ACETAMINOPHEN 5-325 MG PO TABS: 1.0000 | ORAL_TABLET | Freq: Four times a day (QID) | ORAL | Status: DC | PRN

## 2013-02-25 MED ORDER — ISOSORBIDE MONONITRATE ER 60 MG PO TB24: 60.0000 mg | ORAL_TABLET | Freq: Every day | ORAL | Status: DC

## 2013-02-25 MED ORDER — RIVAROXABAN 20 MG PO TABS: 20.0000 mg | ORAL_TABLET | Freq: Every day | ORAL | Status: AC

## 2013-02-25 MED ORDER — CEFDINIR 300 MG PO CAPS: 300.0000 mg | ORAL_CAPSULE | Freq: Two times a day (BID) | ORAL | Status: DC

## 2013-02-25 MED ORDER — METOPROLOL TARTRATE 25 MG PO TABS: 25.0000 mg | ORAL_TABLET | Freq: Two times a day (BID) | ORAL | Status: DC

## 2013-02-25 MED ORDER — IPRATROPIUM-ALBUTEROL 20-100 MCG/ACT IN AERS: 1.0000 | INHALATION_SPRAY | RESPIRATORY_TRACT | Status: DC | PRN

## 2013-02-25 NOTE — Patient Instructions (Addendum)
Use Duoneb 4 times daily on schedule Use Budesonide twice daily on schedule Take prednisone 10mg  Take 4 for three days 3 for three days 2 for three days then one daily for three days and STOP Cefdinir 300mg  twice daily for 10days Stay on theodur A theophylline level will be obtained A Chest xray will be obtained REfills on medications sent to [facility] You need to obtain a primary care MD Return 1 month

## 2013-02-25 NOTE — Assessment & Plan Note (Signed)
COPD with recurrent exacerbation and chronic respiratory failure Plan Use Duoneb 4 times daily on schedule Use Budesonide twice daily on schedule Take prednisone 10mg  Take 4 for three days 3 for three days 2 for three days then one daily for three days and STOP Cefdinir 300mg  twice daily for 10days Stay on theodur A theophylline level will be obtained

## 2013-02-25 NOTE — Assessment & Plan Note (Signed)
Chronic respiratory failure Maintain oxygen there

## 2013-02-25 NOTE — Progress Notes (Signed)
Subjective:    Patient ID: [name], female    DOB: [birth year], 58 y.o.   MRN: [id number]  HPI Comments: Self referred .  Hx Copd.  Saw Kahn in [place], not seen recently.  Dx copd 2008 On oxygen 5 years.  Current exac started two weeks ago, body aches, dyspnea, cough , prod cough yellow green. On oxygen 5Liters 24/7 [employer]  Cough This is a chronic problem. The current episode started 1 to 4 weeks ago (more cough and congestion for two weeks). The problem has been rapidly worsening. The problem occurs every few minutes. The cough is productive of purulent sputum. Associated symptoms include chills, headaches, heartburn, nasal congestion, postnasal drip, a rash, rhinorrhea, shortness of breath and wheezing. Pertinent negatives include no chest pain, ear congestion, ear pain, fever, hemoptysis, myalgias, sore throat, sweats or weight loss. The symptoms are aggravated by exercise, lying down, fumes and dust. Risk factors for lung disease include smoking/tobacco exposure. She has tried a beta-agonist inhaler and steroid inhaler for the symptoms. The treatment provided moderate relief. Her past medical history is significant for bronchitis, COPD, emphysema and pneumonia. There is no history of asthma, bronchiectasis or environmental allergies.    Past Medical History  Diagnosis Date  . Diabetes mellitus without complication   . COPD (chronic obstructive pulmonary disease)   . Acute MI   . Coronary artery disease   . Shortness of breath   . Stroke TIA   . Arthritis      Family History  Problem Relation Age of Onset  . Lung cancer Brother   . Emphysema Brother   . COPD Sister   . Heart attack Sister   . Diabetes Brother   . Lung cancer Brother   . Emphysema Sister      History   Social History  . Marital Status: Single    Spouse Name: N/A    Number of Children: N/A  . Years of Education: N/A   Occupational History  . Not on file.   Social History Main Topics  . Smoking  status: Former Smoker -- 3.00 packs/day for 40 years    Types: Cigarettes    Quit date: 02/06/2009  . Smokeless tobacco: Never Used  . Alcohol Use: No  . Drug Use: Not on file  . Sexual Activity: Not Currently   Other Topics Concern  . Not on file   Social History Narrative  . No narrative on file     Allergies  Allergen Reactions  . Ciprofloxacin     REACTION: hives/rash     Outpatient Prescriptions Prior to Visit  Medication Sig Dispense Refill  . aspirin EC 81 MG tablet Take 81 mg by mouth daily.      . insulin aspart (NOVOLOG FLEXPEN) 100 UNIT/ML SOPN FlexPen Inject 6 Units into the skin 3 (three) times daily with meals.  2 pen  5  . Insulin Glargine (LANTUS SOLOSTAR) 100 UNIT/ML SOPN Inject 20 Units into the skin every morning.  2 pen  5  . Insulin Pen Needle 32G X 4 MM MISC 1 each by Does not apply route as needed.  100 each  5  . ipratropium-albuterol (DUONEB) 0.5-2.5 (3) MG/3ML SOLN Take 3 mLs by nebulization 4 (four) times daily.       . pantoprazole (PROTONIX) 40 MG tablet Take 40 mg by mouth daily as needed.       Marland Kitchen PROAIR HFA 108 (90 BASE) MCG/ACT inhaler Inhale 1 puff into the lungs every  4 (four) hours as needed. For shortness of breath      . atorvastatin (LIPITOR) 20 MG tablet Take 1 tablet by mouth daily.      . isosorbide mononitrate (IMDUR) 60 MG 24 hr tablet Take 1 tablet by mouth daily.      . metoprolol tartrate (LOPRESSOR) 25 MG tablet Take 1 tablet by mouth 2 (two) times daily.      . theophylline (THEODUR) 200 MG 12 hr tablet Take 200 mg by mouth 2 (two) times daily.       Alveda Reasons 20 MG TABS tablet Take 20 mg by mouth daily.      Marland Kitchen albuterol-ipratropium (COMBIVENT) 18-103 MCG/ACT inhaler Inhale 1-2 puffs into the lungs every 4 (four) hours as needed for wheezing or shortness of breath.      Marland Kitchen azithromycin (ZITHROMAX) 500 MG tablet Take 1 tablet (500 mg total) by mouth daily.  3 tablet  0  . Hydrocodone-Acetaminophen 7.5-300 MG TABS Take 1 tablet by  mouth every 6 (six) hours as needed. For pain  20 each  0  . predniSONE (DELTASONE) 20 MG tablet Take 1 tablet (20 mg total) by mouth daily with breakfast. 1 tab daily for 3 days then half tab for 4 days then stop  5 tablet  0  . simethicone (MYLICON) 80 MG chewable tablet Chew 1 tablet (80 mg total) by mouth every 6 (six) hours as needed for flatulence.  30 tablet  0   No facility-administered medications prior to visit.      Review of Systems  Constitutional: Positive for chills and appetite change. Negative for fever, weight loss, activity change, fatigue and unexpected weight change.  HENT: Positive for congestion, facial swelling, hearing loss, postnasal drip, rhinorrhea, sinus pressure, sneezing, tinnitus and voice change. Negative for dental problem, ear discharge, ear pain, mouth sores, nosebleeds, sore throat and trouble swallowing.   Eyes: Positive for itching and visual disturbance. Negative for photophobia and discharge.  Respiratory: Positive for cough, chest tightness, shortness of breath and wheezing. Negative for apnea, hemoptysis, choking and stridor.   Cardiovascular: Negative for chest pain, palpitations and leg swelling.  Gastrointestinal: Positive for heartburn. Negative for nausea, vomiting, abdominal pain, constipation, blood in stool and abdominal distention.  Genitourinary: Negative for dysuria, urgency, frequency, hematuria, flank pain, decreased urine volume and difficulty urinating.  Musculoskeletal: Positive for neck pain. Negative for arthralgias, back pain, gait problem, joint swelling, myalgias and neck stiffness.  Skin: Positive for rash. Negative for color change and pallor.  Allergic/Immunologic: Negative for environmental allergies.  Neurological: Positive for light-headedness and headaches. Negative for dizziness, tremors, seizures, syncope, speech difficulty, weakness and numbness.  Hematological: Negative for adenopathy. Does not bruise/bleed easily.   Psychiatric/Behavioral: Positive for sleep disturbance. Negative for confusion and agitation. The patient is not nervous/anxious.        Objective:   Physical Exam Filed Vitals:   02/25/13 1102  BP: 110/70  Pulse: 99  Temp: 97.7 F (36.5 C)  TempSrc: Oral  Height: 5' 6.5" (1.689 m)  Weight: 231 lb (104.781 kg)  SpO2: 97%    Gen: obese WF, in no distress,  depressed affect  ENT: No lesions,  mouth clear,  oropharynx clear, no postnasal drip  Neck: No JVD, no TMG, no carotid bruits  Lungs: No use of accessory muscles, no dullness to percussion, expired wheezes with poor airflow  Cardiovascular: RRR, heart sounds normal, no murmur or gallops, no peripheral edema  Abdomen: soft and NT, no HSM,  BS normal  Musculoskeletal: No deformities, no cyanosis or clubbing  Neuro: alert, non focal  Skin: Warm, no lesions or rashes  No results found.      Assessment & Plan:   C O P D WITH ACUTE EXACERBATION COPD with recurrent exacerbation and chronic respiratory failure Plan Use Duoneb 4 times daily on schedule Use Budesonide twice daily on schedule Take prednisone 10mg  Take 4 for three days 3 for three days 2 for three days then one daily for three days and STOP Cefdinir 300mg  twice daily for 10days Stay on theodur A theophylline level will be obtained   Chronic respiratory failure Chronic respiratory failure Maintain oxygen there Pt refused a CXR   Updated Medication List Outpatient Encounter Prescriptions as of 02/25/2013  Medication Sig  . aspirin EC 81 MG tablet Take 81 mg by mouth daily.  Marland Kitchen atorvastatin (LIPITOR) 20 MG tablet Take 1 tablet (20 mg total) by mouth daily.  . budesonide (PULMICORT) 0.5 MG/2ML nebulizer solution Take 0.5 mg by nebulization 2 (two) times daily.   . insulin aspart (NOVOLOG FLEXPEN) 100 UNIT/ML SOPN FlexPen Inject 6 Units into the skin 3 (three) times daily with meals.  . Insulin Glargine (LANTUS SOLOSTAR) 100 UNIT/ML SOPN Inject 20  Units into the skin every morning.  . Insulin Pen Needle 32G X 4 MM MISC 1 each by Does not apply route as needed.  . Ipratropium-Albuterol (COMBIVENT RESPIMAT) 20-100 MCG/ACT AERS respimat Inhale 1-2 puffs into the lungs every 4 (four) hours as needed for wheezing.  Marland Kitchen ipratropium-albuterol (DUONEB) 0.5-2.5 (3) MG/3ML SOLN Take 3 mLs by nebulization 4 (four) times daily.   . isosorbide mononitrate (IMDUR) 60 MG 24 hr tablet Take 1 tablet (60 mg total) by mouth daily.  . metoprolol tartrate (LOPRESSOR) 25 MG tablet Take 1 tablet (25 mg total) by mouth 2 (two) times daily.  . ONE TOUCH ULTRA TEST test strip Use as directed  . ONETOUCH DELICA LANCETS 34H MISC Use as directed  . pantoprazole (PROTONIX) 40 MG tablet Take 40 mg by mouth daily as needed.   Marland Kitchen PROAIR HFA 108 (90 BASE) MCG/ACT inhaler Inhale 1 puff into the lungs every 4 (four) hours as needed. For shortness of breath  . Rivaroxaban (XARELTO) 20 MG TABS tablet Take 1 tablet (20 mg total) by mouth daily.  . theophylline (THEODUR) 200 MG 12 hr tablet Take 1 tablet (200 mg total) by mouth 2 (two) times daily.  . [DISCONTINUED] atorvastatin (LIPITOR) 20 MG tablet Take 1 tablet by mouth daily.  . [DISCONTINUED] Ipratropium-Albuterol (COMBIVENT RESPIMAT) 20-100 MCG/ACT AERS respimat Inhale 1-2 puffs into the lungs every 4 (four) hours as needed for wheezing.  . [DISCONTINUED] isosorbide mononitrate (IMDUR) 60 MG 24 hr tablet Take 1 tablet by mouth daily.  . [DISCONTINUED] metoprolol tartrate (LOPRESSOR) 25 MG tablet Take 1 tablet by mouth 2 (two) times daily.  . [DISCONTINUED] theophylline (THEODUR) 200 MG 12 hr tablet Take 200 mg by mouth 2 (two) times daily.   . [DISCONTINUED] XARELTO 20 MG TABS tablet Take 20 mg by mouth daily.  . cefdinir (OMNICEF) 300 MG capsule Take 1 capsule (300 mg total) by mouth 2 (two) times daily.  Marland Kitchen HYDROcodone-acetaminophen (NORCO/VICODIN) 5-325 MG per tablet Take 1 tablet by mouth every 6 (six) hours as needed for  moderate pain.  . predniSONE (DELTASONE) 10 MG tablet Take 4 tabs/day for 3 days, 3 tabs/day for 3 days, 2 tabs/day for 3 days, then 1 tab/day for 3 days then stop.  . [  DISCONTINUED] albuterol-ipratropium (COMBIVENT) 18-103 MCG/ACT inhaler Inhale 1-2 puffs into the lungs every 4 (four) hours as needed for wheezing or shortness of breath.  . [DISCONTINUED] azithromycin (ZITHROMAX) 500 MG tablet Take 1 tablet (500 mg total) by mouth daily.  . [DISCONTINUED] Hydrocodone-Acetaminophen 7.5-300 MG TABS Take 1 tablet by mouth every 6 (six) hours as needed. For pain  . [DISCONTINUED] predniSONE (DELTASONE) 20 MG tablet Take 1 tablet (20 mg total) by mouth daily with breakfast. 1 tab daily for 3 days then half tab for 4 days then stop  . [DISCONTINUED] simethicone (MYLICON) 80 MG chewable tablet Chew 1 tablet (80 mg total) by mouth every 6 (six) hours as needed for flatulence.

## 2013-02-26 LAB — THEOPHYLLINE LEVEL: Theophylline Lvl: 1.1 ug/mL — ABNORMAL LOW (ref 10.0–20.0)

## 2013-02-27 NOTE — Progress Notes (Signed)
Quick Note:  Called, spoke with pt. Informed her of above results and recs per Dr. Joya Gaskins. She verbalized understanding of this. ______

## 2013-03-25 ENCOUNTER — Ambulatory Visit: Payer: 59 | Admitting: Critical Care Medicine

## 2013-05-16 ENCOUNTER — Other Ambulatory Visit: Payer: Self-pay | Admitting: Internal Medicine

## 2013-07-29 ENCOUNTER — Other Ambulatory Visit: Payer: Self-pay | Admitting: Critical Care Medicine

## 2013-07-30 ENCOUNTER — Telehealth: Payer: Self-pay | Admitting: Critical Care Medicine

## 2013-07-30 NOTE — Telephone Encounter (Signed)
Received refill request for Theodur 200 mg bid.  Pt was seen by PW on 02/25/13.  Theophylline level was done.  Per theophylline results:  Notes Recorded by Elsie Stain, MD on 02/27/2013 at 9:43 AM Call pt tell her no detectable levels of the theophylline in her blood Suggest officially stopping theodur at this point  Called, spoke with pt.  Pt states she never stopped the Theodur.  States she is still taking it; 200 mg bid.  Dr. Joya Gaskins, please advise if pt should continue with this or if she should stop.  If she is to continue, she will need rx.  Thank you.

## 2013-07-30 NOTE — Telephone Encounter (Signed)
Do not refill  Do not recommend she continue theodur

## 2013-07-30 NOTE — Telephone Encounter (Signed)
LMTCB

## 2013-07-30 NOTE — Telephone Encounter (Signed)
Please see phone msg from 07/30/13. Thank you.

## 2013-07-31 NOTE — Telephone Encounter (Signed)
Called, spoke with pt. Informed her of below per Dr. Joya Gaskins.  She verbalized understanding and voiced no further questions or concerns at this time.

## 2013-09-03 ENCOUNTER — Emergency Department (HOSPITAL_COMMUNITY): Payer: 59

## 2013-09-03 ENCOUNTER — Encounter (HOSPITAL_COMMUNITY): Payer: Self-pay | Admitting: Emergency Medicine

## 2013-09-03 ENCOUNTER — Emergency Department (HOSPITAL_COMMUNITY)
Admission: EM | Admit: 2013-09-03 | Discharge: 2013-09-03 | Disposition: A | Discharge status: Home / Self Care | Payer: 59 | Attending: Emergency Medicine | Admitting: Emergency Medicine

## 2013-09-03 DIAGNOSIS — J9610 Chronic respiratory failure, unspecified whether with hypoxia or hypercapnia: Secondary | ICD-10-CM | POA: Insufficient documentation

## 2013-09-03 DIAGNOSIS — M79609 Pain in unspecified limb: Secondary | ICD-10-CM | POA: Diagnosis present

## 2013-09-03 DIAGNOSIS — Z79899 Other long term (current) drug therapy: Secondary | ICD-10-CM | POA: Insufficient documentation

## 2013-09-03 DIAGNOSIS — Z7982 Long term (current) use of aspirin: Secondary | ICD-10-CM | POA: Diagnosis not present

## 2013-09-03 DIAGNOSIS — M25519 Pain in unspecified shoulder: Secondary | ICD-10-CM | POA: Diagnosis not present

## 2013-09-03 DIAGNOSIS — Z8673 Personal history of transient ischemic attack (TIA), and cerebral infarction without residual deficits: Secondary | ICD-10-CM | POA: Insufficient documentation

## 2013-09-03 DIAGNOSIS — Z87891 Personal history of nicotine dependence: Secondary | ICD-10-CM | POA: Insufficient documentation

## 2013-09-03 DIAGNOSIS — I252 Old myocardial infarction: Secondary | ICD-10-CM | POA: Diagnosis not present

## 2013-09-03 DIAGNOSIS — I2510 Atherosclerotic heart disease of native coronary artery without angina pectoris: Secondary | ICD-10-CM | POA: Diagnosis not present

## 2013-09-03 DIAGNOSIS — Z794 Long term (current) use of insulin: Secondary | ICD-10-CM | POA: Insufficient documentation

## 2013-09-03 DIAGNOSIS — Z951 Presence of aortocoronary bypass graft: Secondary | ICD-10-CM | POA: Diagnosis not present

## 2013-09-03 DIAGNOSIS — M25569 Pain in unspecified knee: Secondary | ICD-10-CM | POA: Diagnosis not present

## 2013-09-03 DIAGNOSIS — M25549 Pain in joints of unspecified hand: Secondary | ICD-10-CM | POA: Diagnosis not present

## 2013-09-03 DIAGNOSIS — J441 Chronic obstructive pulmonary disease with (acute) exacerbation: Secondary | ICD-10-CM | POA: Diagnosis not present

## 2013-09-03 DIAGNOSIS — E119 Type 2 diabetes mellitus without complications: Secondary | ICD-10-CM | POA: Diagnosis not present

## 2013-09-03 DIAGNOSIS — M2550 Pain in unspecified joint: Secondary | ICD-10-CM

## 2013-09-03 LAB — CBC WITH DIFFERENTIAL/PLATELET
BASOS PCT: 0 % (ref 0–1)
Basophils Absolute: 0 10*3/uL (ref 0.0–0.1)
EOS ABS: 0.4 10*3/uL (ref 0.0–0.7)
Eosinophils Relative: 5 % (ref 0–5)
HEMATOCRIT: 42.1 % (ref 36.0–46.0)
HEMOGLOBIN: 12.3 g/dL (ref 12.0–15.0)
Lymphocytes Relative: 15 % (ref 12–46)
Lymphs Abs: 1.4 10*3/uL (ref 0.7–4.0)
MCH: 28.3 pg (ref 26.0–34.0)
MCHC: 29.2 g/dL — AB (ref 30.0–36.0)
MCV: 96.8 fL (ref 78.0–100.0)
MONO ABS: 0.5 10*3/uL (ref 0.1–1.0)
Monocytes Relative: 5 % (ref 3–12)
NEUTROS ABS: 7 10*3/uL (ref 1.7–7.7)
Neutrophils Relative %: 75 % (ref 43–77)
Platelets: 183 10*3/uL (ref 150–400)
RBC: 4.35 MIL/uL (ref 3.87–5.11)
RDW: 13.9 % (ref 11.5–15.5)
WBC: 9.3 10*3/uL (ref 4.0–10.5)

## 2013-09-03 LAB — BASIC METABOLIC PANEL
Anion gap: 7 (ref 5–15)
BUN: 15 mg/dL (ref 6–23)
CHLORIDE: 98 meq/L (ref 96–112)
CO2: 38 mEq/L — ABNORMAL HIGH (ref 19–32)
Calcium: 9.6 mg/dL (ref 8.4–10.5)
Creatinine, Ser: 0.54 mg/dL (ref 0.50–1.10)
GFR calc non Af Amer: >90 mL/min (ref >90)
Glucose, Bld: 241 mg/dL — ABNORMAL HIGH (ref 70–99)
Potassium: 4.7 mEq/L (ref 3.7–5.3)
Sodium: 143 mEq/L (ref 137–147)

## 2013-09-03 MED ORDER — TRAMADOL HCL 50 MG PO TABS: 50.0000 mg | ORAL_TABLET | Freq: Four times a day (QID) | ORAL | Status: DC | PRN

## 2013-09-03 MED ORDER — OXYCODONE-ACETAMINOPHEN 5-325 MG PO TABS
2.0000 | ORAL_TABLET | Freq: Once | ORAL | Status: AC
  Administered 2013-09-03: 2 via ORAL
  Filled 2013-09-03: qty 2

## 2013-09-03 MED ORDER — PREDNISONE 20 MG PO TABS: 60.0000 mg | ORAL_TABLET | Freq: Every day | ORAL | Status: DC

## 2013-09-03 MED ORDER — PREDNISONE 20 MG PO TABS
60.0000 mg | ORAL_TABLET | Freq: Once | ORAL | Status: AC
  Administered 2013-09-03: 60 mg via ORAL
  Filled 2013-09-03: qty 3

## 2013-09-03 NOTE — Discharge Instructions (Signed)
Arthralgia °Your caregiver has diagnosed you as suffering from an arthralgia. Arthralgia means there is pain in a joint. This can come from many reasons including: °· Bruising the joint which causes soreness (inflammation) in the joint. °· Wear and tear on the joints which occur as we grow older (osteoarthritis). °· Overusing the joint. °· Various forms of arthritis. °· Infections of the joint. °Regardless of the cause of pain in your joint, most of these different pains respond to anti-inflammatory drugs and rest. The exception to this is when a joint is infected, and these cases are treated with antibiotics, if it is a bacterial infection. °HOME CARE INSTRUCTIONS  °· Rest the injured area for as long as directed by your caregiver. Then slowly start using the joint as directed by your caregiver and as the pain allows. Crutches as directed may be useful if the ankles, knees or hips are involved. If the knee was splinted or casted, continue use and care as directed. If an stretchy or elastic wrapping bandage has been applied today, it should be removed and re-applied every 3 to 4 hours. It should not be applied tightly, but firmly enough to keep swelling down. Watch toes and feet for swelling, bluish discoloration, coldness, numbness or excessive pain. If any of these problems (symptoms) occur, remove the ace bandage and re-apply more loosely. If these symptoms persist, contact your caregiver or return to this location. °· For the first 24 hours, keep the injured extremity elevated on pillows while lying down. °· Apply ice for 15-20 minutes to the sore joint every couple hours while awake for the first half day. Then 03-04 times per day for the first 48 hours. Put the ice in a plastic bag and place a towel between the bag of ice and your skin. °· Wear any splinting, casting, elastic bandage applications, or slings as instructed. °· Only take over-the-counter or prescription medicines for pain, discomfort, or fever as  directed by your caregiver. Do not use aspirin immediately after the injury unless instructed by your physician. Aspirin can cause increased bleeding and bruising of the tissues. °· If you were given crutches, continue to use them as instructed and do not resume weight bearing on the sore joint until instructed. °Persistent pain and inability to use the sore joint as directed for more than 2 to 3 days are warning signs indicating that you should see a caregiver for a follow-up visit as soon as possible. Initially, a hairline fracture (break in bone) may not be evident on X-rays. Persistent pain and swelling indicate that further evaluation, non-weight bearing or use of the joint (use of crutches or slings as instructed), or further X-rays are indicated. X-rays may sometimes not show a small fracture until a week or 10 days later. Make a follow-up appointment with your own caregiver or one to whom we have referred you. A radiologist (specialist in reading X-rays) may read your X-rays. Make sure you know how you are to obtain your X-ray results. Do not assume everything is normal if you do not hear from us. °SEEK MEDICAL CARE IF: °Bruising, swelling, or pain increases. °SEEK IMMEDIATE MEDICAL CARE IF:  °· Your fingers or toes are numb or blue. °· The pain is not responding to medications and continues to stay the same or get worse. °· The pain in your joint becomes severe. °· You develop a fever over 102° F (38.9° C). °· It becomes impossible to move or use the joint. °MAKE SURE YOU:  °·   Understand these instructions.  Will watch your condition.  Will get help right away if you are not doing well or get worse. Document Released: 01/23/2005 Document Revised: 04/17/2011 Document Reviewed: 09/11/2007 [facility] Patient Information 2015 [place], [place]. This information is not intended to replace advice given to you by your health care provider. Make sure you discuss any questions you have with your health care  provider.  Chronic Obstructive Pulmonary Disease Exacerbation Chronic obstructive pulmonary disease (COPD) is a common lung condition in which airflow from the lungs is limited. COPD is a general term that can be used to describe many different lung problems that limit airflow, including chronic bronchitis and emphysema. COPD exacerbations are episodes when breathing symptoms become much worse and require extra treatment. Without treatment, COPD exacerbations can be life threatening, and frequent COPD exacerbations can cause further damage to your lungs. CAUSES   Respiratory infections.   Exposure to smoke.   Exposure to air pollution, chemical fumes, or dust. Sometimes there is no apparent cause or trigger. RISK FACTORS  Smoking cigarettes.  Older age.  Frequent prior COPD exacerbations. SIGNS AND SYMPTOMS   Increased coughing.   Increased thick spit (sputum) production.   Increased wheezing.   Increased shortness of breath.   Rapid breathing.   Chest tightness. DIAGNOSIS  Your medical history, a physical exam, and tests will help your health care provider make a diagnosis. Tests may include:  A chest X-ray.  Basic lab tests.  Sputum testing.  An arterial blood gas test. TREATMENT  Depending on the severity of your COPD exacerbation, you may need to be admitted to a hospital for treatment. Some of the treatments commonly used to treat COPD exacerbations are:   Antibiotic medicines.   Bronchodilators. These are drugs that expand the air passages. They may be given with an inhaler or nebulizer. Spacer devices may be needed to help improve drug delivery.  Corticosteroid medicines.  Supplemental oxygen therapy.  HOME CARE INSTRUCTIONS   Do not smoke. Quitting smoking is very important to prevent COPD from getting worse and exacerbations from happening as often.  Avoid exposure to all substances that irritate the airway, especially to tobacco smoke.    If you were prescribed an antibiotic medicine, finish it all even if you start to feel better.  Take all medicines as directed by your health care provider.It is important to use correct technique with inhaled medicines.  Drink enough fluids to keep your urine clear or pale yellow (unless you have a medical condition that requires fluid restriction).  Use a cool mist vaporizer. This makes it easier to clear your chest when you cough.   If you have a home nebulizer and oxygen, continue to use them as directed.   Maintain all necessary vaccinations to prevent infections.   Exercise regularly.   Eat a healthy diet.   Keep all follow-up appointments as directed by your health care provider. SEEK IMMEDIATE MEDICAL CARE IF:  You have worsening shortness of breath.   You have trouble talking.   You have severe chest pain.  You have blood in your sputum.  You have a fever.  You have weakness, vomit repeatedly, or faint.   You feel confused.   You continue to get worse. MAKE SURE YOU:   Understand these instructions.  Will watch your condition.  Will get help right away if you are not doing well or get worse. Document Released: 11/20/2006 Document Revised: 06/09/2013 Document Reviewed: 09/27/2012 [facility] Patient Information 2015 [place], [place].  This information is not intended to replace advice given to you by your health care provider. Make sure you discuss any questions you have with your health care provider. ° °

## 2013-09-03 NOTE — ED Notes (Signed)
Pt. arrived with EMS from home initial complaints of bilateral arms/legs and shoulders pain for several days , denies injury , felt short of breath with wheezing while being transported to ER - received Albuterol 5 mg nebulizer treatment . Pt. stated she takes Albuterol nebulizer treatment q 4 hours at home . Denies chest pain .

## 2013-09-03 NOTE — ED Provider Notes (Signed)
CSN: [id number]     Arrival date & time 09/03/13  1829 History   First MD Initiated Contact with Patient 09/03/13 0440     Chief Complaint  Patient presents with  . Arm Pain  . Shortness of Breath     (Consider location/radiation/quality/duration/timing/severity/associated sxs/prior Treatment) HPI 58 year old female presents to the emergency department via EMS from home with complaint of bilateral shoulder, hand, leg pain.  Symptoms have been ongoing for several days.  Patient has history of arthritis, but reports pain has never been this bad.  EMS reports patient with shortness of breath and route, received albuterol 5 mg.  Patient has severe COPD.  She cannot tolerate anything over her nose or mouth, and wears 5 L nasal cannula in her mouth at all times.  She denies any fever or chills, no cough or shortness of breath above her baseline until she began to be moved by EMS.   Past Medical History  Diagnosis Date  . Diabetes mellitus without complication   . COPD (chronic obstructive pulmonary disease)   . Acute MI   . Coronary artery disease   . Shortness of breath   . Stroke TIA   . Arthritis    Past Surgical History  Procedure Laterality Date  . Abdominal hysterectomy    . Coronary artery bypass graft    . Cardiac catheterization     Family History  Problem Relation Age of Onset  . Lung cancer Brother   . Emphysema Brother   . COPD Sister   . Heart attack Sister   . Diabetes Brother   . Lung cancer Brother   . Emphysema Sister    History  Substance Use Topics  . Smoking status: Former Smoker -- 3.00 packs/day for 40 years    Types: Cigarettes    Quit date: 02/06/2009  . Smokeless tobacco: Never Used  . Alcohol Use: No   OB History   Grav Para Term Preterm Abortions TAB SAB Ect Mult Living                 Review of Systems   See History of Present Illness; otherwise all other systems are reviewed and negative  Allergies  Ciprofloxacin  Home Medications    Prior to Admission medications   Medication Sig Start Date End Date Taking? Authorizing Provider  ALPRAZolam (XANAX) 0.25 MG tablet Take 0.25 mg by mouth 2 (two) times daily as needed. anxiety   Yes Historical Provider, MD  aspirin EC 81 MG tablet Take 81 mg by mouth daily.   Yes Historical Provider, MD  atorvastatin (LIPITOR) 20 MG tablet Take 1 tablet (20 mg total) by mouth daily. 02/25/13  Yes Elsie Stain, MD  budesonide (PULMICORT) 0.5 MG/2ML nebulizer solution Take 0.5 mg by nebulization 2 (two) times daily.    Yes Historical Provider, MD  insulin aspart (NOVOLOG FLEXPEN) 100 UNIT/ML SOPN FlexPen Inject 6 Units into the skin 3 (three) times daily with meals. 12/18/12  Yes Costin Karlyne Greenspan, MD  insulin glargine (LANTUS) 100 unit/mL SOPN Inject 25 Units into the skin every morning.   Yes Historical Provider, MD  Ipratropium-Albuterol (COMBIVENT RESPIMAT) 20-100 MCG/ACT AERS respimat Inhale 1-2 puffs into the lungs every 4 (four) hours as needed for wheezing. 02/25/13  Yes Elsie Stain, MD  ipratropium-albuterol (DUONEB) 0.5-2.5 (3) MG/3ML SOLN Take 3 mLs by nebulization 4 (four) times daily.    Yes Historical Provider, MD  isosorbide mononitrate (IMDUR) 60 MG 24 hr tablet Take 1 tablet (  60 mg total) by mouth daily. 02/25/13  Yes Elsie Stain, MD  metoprolol tartrate (LOPRESSOR) 25 MG tablet Take 1 tablet (25 mg total) by mouth 2 (two) times daily. 02/25/13  Yes Elsie Stain, MD  pantoprazole (PROTONIX) 40 MG tablet Take 40 mg by mouth daily.  11/20/12  Yes Historical Provider, MD  PROAIR HFA 108 (90 BASE) MCG/ACT inhaler Inhale 1 puff into the lungs every 4 (four) hours as needed. For shortness of breath 11/29/12  Yes Historical Provider, MD  Rivaroxaban (XARELTO) 20 MG TABS tablet Take 1 tablet (20 mg total) by mouth daily. 02/25/13  Yes Elsie Stain, MD  theophylline (THEODUR) 200 MG 12 hr tablet Take 1 tablet (200 mg total) by mouth 2 (two) times daily. 02/25/13  Yes Elsie Stain, MD  HYDROcodone-acetaminophen (NORCO/VICODIN) 5-325 MG per tablet Take 1 tablet by mouth every 6 (six) hours as needed for moderate pain. 02/25/13   Elsie Stain, MD  Insulin Pen Needle 32G X 4 MM MISC 1 each by Does not apply route as needed. 12/18/12   Leshara, MD  ONE TOUCH ULTRA TEST test strip Use as directed    Historical Provider, MD  [facility] DELICA LANCETS 16X MISC Use as directed    Historical Provider, MD  predniSONE (DELTASONE) 20 MG tablet Take 3 tablets (60 mg total) by mouth daily. 09/03/13   Kalman Drape, MD  traMADol (ULTRAM) 50 MG tablet Take 1 tablet (50 mg total) by mouth every 6 (six) hours as needed. 09/03/13   Kalman Drape, MD   BP 128/73  Pulse 106  Temp(Src) 97.8 F (36.6 C) (Oral)  Resp 14  Ht 5' 6.5" (1.689 m)  Wt 227 lb (102.967 kg)  BMI 36.09 kg/m2  SpO2 100% Physical Exam  Constitutional: She is oriented to person, place, and time. She appears distressed.  Chronically ill appearing female older than stated age tripod position, respiratory distress  HENT:  Head: Normocephalic and atraumatic.  Neck: Normal range of motion. Neck supple. No JVD present. No tracheal deviation present. No thyromegaly present.  Cardiovascular: Normal heart sounds and intact distal pulses.  Exam reveals no gallop and no friction rub.   No murmur heard. Tachycardia  Pulmonary/Chest: No stridor. She is in respiratory distress.  Patient is  tachypneic in tripod position with wheezing   Abdominal: Soft. Bowel sounds are normal. She exhibits no distension and no mass. There is no tenderness. There is no rebound and no guarding.  Musculoskeletal: Normal range of motion. She exhibits tenderness. She exhibits no edema.  Patient has pain with palpation of bilateral shoulders, hands, legs throughout.  No step-off crepitus deformity warmth overlying skin changes or other outward signs of injury or disease  Lymphadenopathy:    She has no cervical adenopathy.  Neurological:  She is alert and oriented to person, place, and time. She exhibits normal muscle tone. Coordination normal.  Skin: Skin is warm and dry. No rash noted. She is not diaphoretic. No erythema. No pallor.  Psychiatric: She has a normal mood and affect. Her behavior is normal. Judgment and thought content normal.    ED Course  Procedures (including critical care time) Labs Review Labs Reviewed  CBC WITH DIFFERENTIAL - Abnormal; Notable for the following:    MCHC 29.2 (*)    All other components within normal limits  BASIC METABOLIC PANEL - Abnormal; Notable for the following:    CO2 38 (*)    Glucose, Bld 241 (*)  All other components within normal limits    Imaging Review Dg Chest Port 1 View  09/03/2013   CLINICAL DATA:  Shortness of breath, COPD.  EXAM: PORTABLE CHEST - 1 VIEW  COMPARISON:  Chest radiograph December 15, 2012  FINDINGS: The cardiac silhouette appears at least mild to moderately enlarged, unchanged. Mediastinal silhouette is nonsuspicious, status post median sternotomy for apparent coronary artery bypass grafting. Similar chronic interstitial changes, mildly increased lung volumes without pleural effusions or focal consolidations. No pneumothorax.  Soft tissue planes and included osseous structures are nonsuspicious. Multiple EKG lines overlie the patient and may obscure subtle underlying pathology.  IMPRESSION: Stable cardiomegaly and COPD without superimposed acute pulmonary process.   Electronically Signed   By: Elon Alas   On: 09/03/2013 05:23     EKG Interpretation   Date/Time:  Wednesday September 03 2013 05:01:54 EDT Ventricular Rate:  113 PR Interval:  155 QRS Duration: 96 QT Interval:  356 QTC Calculation: 488 R Axis:   67 Text Interpretation:  Sinus tachycardia Borderline repolarization  abnormality Borderline prolonged QT interval Baseline wander in lead(s) I  II III aVR aVF V5 No significant change since last tracing Confirmed by  Aziya Arena  MD, Kelley Polinsky  ([id number]) on 09/03/2013 6:00:19 AM      MDM   Final diagnoses:  Arthralgia  Chronic respiratory failure, unspecified whether with hypoxia or hypercapnia  C O P D WITH ACUTE EXACERBATION    58 year old female with respiratory distress, arthralgias.  Patient received prednisone and Percocet.  Reevaluate patient after chest x-ray shows that she is much improved, breathing much easier.  Wheezing has resolved.  Pain is controlled.  Chest x-ray without any acute changes.  Labs stable.  Patient reports that she is unable to see a primary care doctor frequently, will give a prescription for Ultram for her pain.  She is strongly encouraged to followup with her primary care Dr. for further workup of her ongoing arthralgia.    Kalman Drape, MD 09/03/13 [phone]

## 2015-05-21 ENCOUNTER — Encounter (HOSPITAL_COMMUNITY): Payer: Self-pay | Admitting: Emergency Medicine

## 2015-05-21 ENCOUNTER — Inpatient Hospital Stay (HOSPITAL_COMMUNITY)
Admission: EM | Admit: 2015-05-21 | Discharge: 2015-05-27 | DRG: 208 | Disposition: A | Discharge status: Home / Self Care | Payer: 59 | Attending: Internal Medicine | Admitting: Internal Medicine

## 2015-05-21 ENCOUNTER — Emergency Department (HOSPITAL_COMMUNITY): Payer: 59

## 2015-05-21 DIAGNOSIS — J9621 Acute and chronic respiratory failure with hypoxia: Secondary | ICD-10-CM | POA: Diagnosis not present

## 2015-05-21 DIAGNOSIS — E872 Acidosis: Secondary | ICD-10-CM | POA: Diagnosis present

## 2015-05-21 DIAGNOSIS — Z8673 Personal history of transient ischemic attack (TIA), and cerebral infarction without residual deficits: Secondary | ICD-10-CM

## 2015-05-21 DIAGNOSIS — E785 Hyperlipidemia, unspecified: Secondary | ICD-10-CM | POA: Diagnosis present

## 2015-05-21 DIAGNOSIS — Z9981 Dependence on supplemental oxygen: Secondary | ICD-10-CM

## 2015-05-21 DIAGNOSIS — I4891 Unspecified atrial fibrillation: Secondary | ICD-10-CM | POA: Diagnosis present

## 2015-05-21 DIAGNOSIS — R0603 Acute respiratory distress: Secondary | ICD-10-CM

## 2015-05-21 DIAGNOSIS — J9622 Acute and chronic respiratory failure with hypercapnia: Secondary | ICD-10-CM | POA: Diagnosis present

## 2015-05-21 DIAGNOSIS — J189 Pneumonia, unspecified organism: Secondary | ICD-10-CM | POA: Diagnosis present

## 2015-05-21 DIAGNOSIS — R300 Dysuria: Secondary | ICD-10-CM | POA: Diagnosis not present

## 2015-05-21 DIAGNOSIS — Z794 Long term (current) use of insulin: Secondary | ICD-10-CM

## 2015-05-21 DIAGNOSIS — Z978 Presence of other specified devices: Secondary | ICD-10-CM

## 2015-05-21 DIAGNOSIS — Z7951 Long term (current) use of inhaled steroids: Secondary | ICD-10-CM

## 2015-05-21 DIAGNOSIS — J440 Chronic obstructive pulmonary disease with acute lower respiratory infection: Secondary | ICD-10-CM | POA: Diagnosis present

## 2015-05-21 DIAGNOSIS — G9341 Metabolic encephalopathy: Secondary | ICD-10-CM | POA: Diagnosis present

## 2015-05-21 DIAGNOSIS — D72829 Elevated white blood cell count, unspecified: Secondary | ICD-10-CM

## 2015-05-21 DIAGNOSIS — Z6837 Body mass index (BMI) 37.0-37.9, adult: Secondary | ICD-10-CM

## 2015-05-21 DIAGNOSIS — Z7952 Long term (current) use of systemic steroids: Secondary | ICD-10-CM

## 2015-05-21 DIAGNOSIS — I252 Old myocardial infarction: Secondary | ICD-10-CM

## 2015-05-21 DIAGNOSIS — I10 Essential (primary) hypertension: Secondary | ICD-10-CM | POA: Diagnosis present

## 2015-05-21 DIAGNOSIS — I2510 Atherosclerotic heart disease of native coronary artery without angina pectoris: Secondary | ICD-10-CM | POA: Diagnosis present

## 2015-05-21 DIAGNOSIS — R6520 Severe sepsis without septic shock: Secondary | ICD-10-CM

## 2015-05-21 DIAGNOSIS — R4182 Altered mental status, unspecified: Secondary | ICD-10-CM | POA: Diagnosis not present

## 2015-05-21 DIAGNOSIS — D6489 Other specified anemias: Secondary | ICD-10-CM | POA: Diagnosis present

## 2015-05-21 DIAGNOSIS — E876 Hypokalemia: Secondary | ICD-10-CM | POA: Diagnosis not present

## 2015-05-21 DIAGNOSIS — J9602 Acute respiratory failure with hypercapnia: Secondary | ICD-10-CM | POA: Diagnosis present

## 2015-05-21 DIAGNOSIS — T380X5A Adverse effect of glucocorticoids and synthetic analogues, initial encounter: Secondary | ICD-10-CM | POA: Diagnosis present

## 2015-05-21 DIAGNOSIS — E46 Unspecified protein-calorie malnutrition: Secondary | ICD-10-CM | POA: Diagnosis present

## 2015-05-21 DIAGNOSIS — J441 Chronic obstructive pulmonary disease with (acute) exacerbation: Secondary | ICD-10-CM | POA: Diagnosis present

## 2015-05-21 DIAGNOSIS — A419 Sepsis, unspecified organism: Secondary | ICD-10-CM

## 2015-05-21 DIAGNOSIS — E119 Type 2 diabetes mellitus without complications: Secondary | ICD-10-CM | POA: Diagnosis present

## 2015-05-21 DIAGNOSIS — Z87891 Personal history of nicotine dependence: Secondary | ICD-10-CM

## 2015-05-21 DIAGNOSIS — J9690 Respiratory failure, unspecified, unspecified whether with hypoxia or hypercapnia: Secondary | ICD-10-CM

## 2015-05-21 DIAGNOSIS — I110 Hypertensive heart disease with heart failure: Secondary | ICD-10-CM | POA: Diagnosis present

## 2015-05-21 DIAGNOSIS — Z7901 Long term (current) use of anticoagulants: Secondary | ICD-10-CM

## 2015-05-21 DIAGNOSIS — E118 Type 2 diabetes mellitus with unspecified complications: Secondary | ICD-10-CM | POA: Diagnosis present

## 2015-05-21 LAB — I-STAT ARTERIAL BLOOD GAS, ED
Acid-Base Excess: 14 mmol/L — ABNORMAL HIGH (ref 0.0–2.0)
Bicarbonate: 44.2 mEq/L — ABNORMAL HIGH (ref 20.0–24.0)
O2 SAT: 100 %
PCO2 ART: 88.4 mmHg — AB (ref 35.0–45.0)
PH ART: 7.306 — AB (ref 7.350–7.450)
TCO2: 47 mmol/L (ref 0–100)
pO2, Arterial: 221 mmHg — ABNORMAL HIGH (ref 80.0–100.0)

## 2015-05-21 LAB — BLOOD GAS, ARTERIAL
Acid-Base Excess: 22 mmol/L — ABNORMAL HIGH (ref 0.0–2.0)
BICARBONATE: 51.3 meq/L — AB (ref 20.0–24.0)
FIO2: 0.55
O2 Saturation: 89.7 %
PH ART: 7.155 — AB (ref 7.350–7.450)
PO2 ART: 65 mmHg — AB (ref 80.0–100.0)
Patient temperature: 98.6
TCO2: 55.9 mmol/L (ref 0–100)

## 2015-05-21 LAB — BRAIN NATRIURETIC PEPTIDE: B NATRIURETIC PEPTIDE 5: 193.2 pg/mL — AB (ref 0.0–100.0)

## 2015-05-21 LAB — CBC WITH DIFFERENTIAL/PLATELET
BASOS ABS: 0 10*3/uL (ref 0.0–0.1)
BASOS PCT: 0 %
Eosinophils Absolute: 0 10*3/uL (ref 0.0–0.7)
Eosinophils Relative: 0 %
HEMATOCRIT: 43.2 % (ref 36.0–46.0)
Hemoglobin: 11.7 g/dL — ABNORMAL LOW (ref 12.0–15.0)
LYMPHS PCT: 6 %
Lymphs Abs: 0.8 10*3/uL (ref 0.7–4.0)
MCH: 28.2 pg (ref 26.0–34.0)
MCHC: 27.1 g/dL — ABNORMAL LOW (ref 30.0–36.0)
MCV: 104.1 fL — AB (ref 78.0–100.0)
MONO ABS: 0.6 10*3/uL (ref 0.1–1.0)
Monocytes Relative: 4 %
NEUTROS ABS: 12.3 10*3/uL — AB (ref 1.7–7.7)
NEUTROS PCT: 90 %
Platelets: 150 10*3/uL (ref 150–400)
RBC: 4.15 MIL/uL (ref 3.87–5.11)
RDW: 13.8 % (ref 11.5–15.5)
WBC: 13.7 10*3/uL — AB (ref 4.0–10.5)

## 2015-05-21 LAB — COMPREHENSIVE METABOLIC PANEL
ALBUMIN: 2.7 g/dL — AB (ref 3.5–5.0)
ALK PHOS: 65 U/L (ref 38–126)
ALT: 13 U/L — ABNORMAL LOW (ref 14–54)
AST: 12 U/L — ABNORMAL LOW (ref 15–41)
Anion gap: 6 (ref 5–15)
BILIRUBIN TOTAL: 0.9 mg/dL (ref 0.3–1.2)
BUN: 8 mg/dL (ref 6–20)
CO2: 46 mmol/L — ABNORMAL HIGH (ref 22–32)
Calcium: 8.9 mg/dL (ref 8.9–10.3)
Chloride: 92 mmol/L — ABNORMAL LOW (ref 101–111)
Creatinine, Ser: 0.51 mg/dL (ref 0.44–1.00)
Glucose, Bld: 90 mg/dL (ref 65–99)
POTASSIUM: 3.9 mmol/L (ref 3.5–5.1)
Sodium: 144 mmol/L (ref 135–145)
Total Protein: 6.9 g/dL (ref 6.5–8.1)

## 2015-05-21 LAB — LIPASE, BLOOD: Lipase: 20 U/L (ref 11–51)

## 2015-05-21 LAB — TRIGLYCERIDES: Triglycerides: 66 mg/dL (ref <150)

## 2015-05-21 LAB — MAGNESIUM: Magnesium: 1.7 mg/dL (ref 1.7–2.4)

## 2015-05-21 LAB — I-STAT CG4 LACTIC ACID, ED: Lactic Acid, Venous: 0.94 mmol/L (ref 0.5–2.0)

## 2015-05-21 MED ORDER — SUCCINYLCHOLINE CHLORIDE 20 MG/ML IJ SOLN
INTRAMUSCULAR | Status: DC | PRN
  Administered 2015-05-21: 150 mg via INTRAVENOUS

## 2015-05-21 MED ORDER — VANCOMYCIN HCL 10 G IV SOLR
1250.0000 mg | Freq: Two times a day (BID) | INTRAVENOUS | Status: DC
  Filled 2015-05-21: qty 1250

## 2015-05-21 MED ORDER — SODIUM CHLORIDE 0.9 % IV BOLUS (SEPSIS)
1000.0000 mL | INTRAVENOUS | Status: AC
  Administered 2015-05-21 (×3): 1000 mL via INTRAVENOUS

## 2015-05-21 MED ORDER — SODIUM CHLORIDE 0.9 % IV BOLUS (SEPSIS)
500.0000 mL | INTRAVENOUS | Status: AC
  Administered 2015-05-21: 500 mL via INTRAVENOUS

## 2015-05-21 MED ORDER — ALBUTEROL (5 MG/ML) CONTINUOUS INHALATION SOLN: 10.0000 mg/h | INHALATION_SOLUTION | Freq: Once | RESPIRATORY_TRACT | Status: DC

## 2015-05-21 MED ORDER — DEXTROSE 5 % IV SOLN
2.0000 g | Freq: Once | INTRAVENOUS | Status: AC
  Administered 2015-05-21: 2 g via INTRAVENOUS
  Filled 2015-05-21: qty 2

## 2015-05-21 MED ORDER — FENTANYL CITRATE (PF) 100 MCG/2ML IJ SOLN
INTRAMUSCULAR | Status: AC
  Administered 2015-05-21: 100 ug
  Filled 2015-05-21: qty 2

## 2015-05-21 MED ORDER — FENTANYL CITRATE (PF) 100 MCG/2ML IJ SOLN
100.0000 ug | INTRAMUSCULAR | Status: AC | PRN
  Administered 2015-05-21 – 2015-05-22 (×3): 100 ug via INTRAVENOUS
  Filled 2015-05-21 (×3): qty 2

## 2015-05-21 MED ORDER — PROPOFOL 1000 MG/100ML IV EMUL
0.0000 ug/kg/min | INTRAVENOUS | Status: DC
  Administered 2015-05-21: 5 ug/kg/min via INTRAVENOUS
  Administered 2015-05-22: 30 ug/kg/min via INTRAVENOUS
  Administered 2015-05-22: 23 ug/kg/min via INTRAVENOUS
  Administered 2015-05-22 – 2015-05-23 (×3): 30 ug/kg/min via INTRAVENOUS
  Administered 2015-05-23 (×3): 40 ug/kg/min via INTRAVENOUS
  Administered 2015-05-24 (×2): 45 ug/kg/min via INTRAVENOUS
  Filled 2015-05-21 (×13): qty 100

## 2015-05-21 MED ORDER — FENTANYL CITRATE (PF) 100 MCG/2ML IJ SOLN
100.0000 ug | INTRAMUSCULAR | Status: DC | PRN
  Administered 2015-05-22 – 2015-05-23 (×7): 100 ug via INTRAVENOUS
  Filled 2015-05-21 (×8): qty 2

## 2015-05-21 MED ORDER — VANCOMYCIN HCL 10 G IV SOLR
2000.0000 mg | Freq: Once | INTRAVENOUS | Status: AC
  Administered 2015-05-21: 2000 mg via INTRAVENOUS
  Filled 2015-05-21: qty 2000

## 2015-05-21 MED ORDER — INSULIN ASPART 100 UNIT/ML ~~LOC~~ SOLN
0.0000 [IU] | Freq: Three times a day (TID) | SUBCUTANEOUS | Status: DC
  Administered 2015-05-22: 3 [IU] via SUBCUTANEOUS

## 2015-05-21 MED ORDER — IPRATROPIUM-ALBUTEROL 0.5-2.5 (3) MG/3ML IN SOLN
3.0000 mL | Freq: Once | RESPIRATORY_TRACT | Status: AC
  Administered 2015-05-21: 3 mL via RESPIRATORY_TRACT
  Filled 2015-05-21: qty 3

## 2015-05-21 MED ORDER — KETAMINE HCL-SODIUM CHLORIDE 100-0.9 MG/10ML-% IV SOSY
100.0000 mg | PREFILLED_SYRINGE | Freq: Once | INTRAVENOUS | Status: DC
  Filled 2015-05-21: qty 10

## 2015-05-21 MED ORDER — LORAZEPAM 2 MG/ML IJ SOLN
2.0000 mg | Freq: Once | INTRAMUSCULAR | Status: AC
  Administered 2015-05-21: 2 mg via INTRAVENOUS
  Filled 2015-05-21: qty 1

## 2015-05-21 MED ORDER — MAGNESIUM SULFATE 2 GM/50ML IV SOLN
2.0000 g | Freq: Once | INTRAVENOUS | Status: AC
  Administered 2015-05-21: 2 g via INTRAVENOUS
  Filled 2015-05-21: qty 50

## 2015-05-21 MED ORDER — DEXTROSE 5 % IV SOLN
1.0000 g | Freq: Three times a day (TID) | INTRAVENOUS | Status: DC
  Filled 2015-05-21: qty 1

## 2015-05-21 MED ORDER — ALBUTEROL (5 MG/ML) CONTINUOUS INHALATION SOLN: 5.0000 mg/h | INHALATION_SOLUTION | Freq: Once | RESPIRATORY_TRACT | Status: DC

## 2015-05-21 MED ORDER — KETAMINE HCL 50 MG/ML IJ SOLN
INTRAMUSCULAR | Status: DC | PRN
  Administered 2015-05-21: 100 mg via INTRAVENOUS

## 2015-05-21 NOTE — Code Documentation (Signed)
Intubation complete.

## 2015-05-21 NOTE — ED Notes (Signed)
Pt here via [facility], from home, with c/o respiratory distress. Family on scene reports pt "has been altered x 2 weeks". EMS reports family refused transport to [facility] even though it was closer in proximity. Pt with respiratory distress that began at approx 1930 tonight. Pt on NRB with aerosol treatment. EMS gave Solumedrol '125mg'$ ; Albuterol '10mg'$ ; Atrovent 0.'5mg'$  enroute. Pt appears very drowsy, confused. Code Sepsis called in by EMS.

## 2015-05-21 NOTE — H&P (Signed)
PULMONARY / CRITICAL CARE MEDICINE   Name: [name] MRN: [id number] DOB: [birth year]    ADMISSION DATE:  05/21/2015  REFERRING MD:  ER  CHIEF COMPLAINT:  Respiratory distress  HISTORY OF PRESENT ILLNESS:    This is a 60 yo woman who came here via EMS  From home with complaints of respiratory distress. On way she was given solumedrol, albuterol, atrovent. Family was called to bedside who provided all of the history. Family had reported that she was 'altered for 2 weeks'. She was having COPD exacerbation for 2 weeks. Husband tried to give her leftover prednisone for the past week- 30 mg for 3 days and then 20 mg for 2 days. They do not recall any change in sputum production or purulence.  She uses Proair, combivent, and albuterol nebulizer. She always uses albuterol nebs 3-4 times a day. She is on home 5 L of oxygen and that has not changed. Family also says pt is also having urinary symptoms and was taking cranberry supplement.   Family says she has COPD exacerbations few times a year which really started in 2003. She ends up hospitalized once a year. She has indicated no BiPap as she has profound claustrophobia. She has lifetime smoking history 1 ppd, but has quit for for 5 years.   Pt was intubated, and she had leukocytosis. ABG shows severe respiratory acidosis, likely chronic. Blood and urine cultures are pending    PAST MEDICAL HISTORY :  She  has a past medical history of Diabetes mellitus without complication ([place]); COPD (chronic obstructive pulmonary disease) ([place]); Acute MI ([place]); Coronary artery disease; Shortness of breath; Stroke ([place]) (TIA ); and Arthritis.  PAST SURGICAL HISTORY: She  has past surgical history that includes Abdominal hysterectomy; Coronary artery bypass graft; and Cardiac catheterization.  Allergies  Allergen Reactions  . Ciprofloxacin Rash    REACTION: hives/rash    No current facility-administered medications on file prior to encounter.    Current Outpatient Prescriptions on File Prior to Encounter  Medication Sig  . ALPRAZolam (XANAX) 0.25 MG tablet Take 0.25 mg by mouth 2 (two) times daily as needed. anxiety  . atorvastatin (LIPITOR) 20 MG tablet Take 1 tablet (20 mg total) by mouth daily.  . budesonide (PULMICORT) 0.5 MG/2ML nebulizer solution Take 0.5 mg by nebulization 2 (two) times daily.   . insulin aspart (NOVOLOG FLEXPEN) 100 UNIT/ML SOPN FlexPen Inject 6 Units into the skin 3 (three) times daily with meals. (Patient taking differently: Inject 20 Units into the skin 3 (three) times daily with meals. )  . Insulin Pen Needle 32G X 4 MM MISC 1 each by Does not apply route as needed.  . Ipratropium-Albuterol (COMBIVENT RESPIMAT) 20-100 MCG/ACT AERS respimat Inhale 1-2 puffs into the lungs every 4 (four) hours as needed for wheezing.  Marland Kitchen ipratropium-albuterol (DUONEB) 0.5-2.5 (3) MG/3ML SOLN Take 3 mLs by nebulization 4 (four) times daily.   . isosorbide mononitrate (IMDUR) 60 MG 24 hr tablet Take 1 tablet (60 mg total) by mouth daily.  . metoprolol tartrate (LOPRESSOR) 25 MG tablet Take 1 tablet (25 mg total) by mouth 2 (two) times daily.  . predniSONE (DELTASONE) 20 MG tablet Take 3 tablets (60 mg total) by mouth daily. (Patient taking differently: Take 40 mg by mouth daily. )  . PROAIR HFA 108 (90 BASE) MCG/ACT inhaler Inhale 1 puff into the lungs every 4 (four) hours as needed for wheezing or shortness of breath.   . Rivaroxaban (XARELTO) 20 MG TABS tablet Take  1 tablet (20 mg total) by mouth daily.    FAMILY HISTORY:  Her has no family status information on file.   SOCIAL HISTORY: She  reports that she quit smoking about 6 years ago. Her smoking use included Cigarettes. She has a 120 pack-year smoking history. She has never used smokeless tobacco. She reports that she does not drink alcohol.  REVIEW OF SYSTEMS:   Per HPI   VITAL SIGNS: BP 129/56 mmHg  Pulse 127  Resp 17  Ht '5\' 4"'$  (1.626 m)  Wt 225 lb (102.059  kg)  BMI 38.60 kg/m2  SpO2 98%  HEMODYNAMICS:    VENTILATOR SETTINGS: Vent Mode:  [-] PRVC FiO2 (%):  [100 %] 100 % Set Rate:  [18 bmp] 18 bmp Vt Set:  [440 mL] 440 mL PEEP:  [5 cmH20] 5 cmH20 Plateau Pressure:  [26 cmH20] 26 cmH20  INTAKE / OUTPUT:    PHYSICAL EXAMINATION: General:  Sedated and intubated  HEENT:  Pupils round and reactive to light  Cardiovascular:  Rrr, no mrg Lungs:  Diffuse wheezing bilaterally and some crackles  Abdomen:  Obese, +BS  LABS:  BMET  Recent Labs Lab 05/21/15 2133  NA 144  K 3.9  CL 92*  CO2 46*  BUN 8  CREATININE 0.51  GLUCOSE 90    Electrolytes  Recent Labs Lab 05/21/15 2133  CALCIUM 8.9    CBC  Recent Labs Lab 05/21/15 2133  WBC 13.7*  HGB 11.7*  HCT 43.2  PLT 150    Coag's No results for input(s): APTT, INR in the last 168 hours.  Sepsis Markers  Recent Labs Lab 05/21/15 2153  LATICACIDVEN 0.94    ABG  Recent Labs Lab 05/21/15 2155  PHART 7.155*  PCO2ART VALUE ABOVE REPORTABLE RANGE.  PO2ART 65*    Liver Enzymes  Recent Labs Lab 05/21/15 2133  AST 12*  ALT 13*  ALKPHOS 65  BILITOT 0.9  ALBUMIN 2.7*    Cardiac Enzymes No results for input(s): TROPONINI, PROBNP in the last 168 hours.  Glucose No results for input(s): GLUCAP in the last 168 hours.  Imaging No results found.   STUDIES:  CXR 4/14 >   CULTURES: Bcx > 4/15 Ucx > 4/15  ANTIBIOTICS: Vanc 4/14> stopped 4/14 Cefepime 4/14 > stopped 4/14   Ceftriaxone 4/15 > Azithromycin 4/15>   SIGNIFICANT EVENTS:   LINES/TUBES: ETT 4/15>  DISCUSSION: 60 yo woman with COPD and other medical problems who was found to be in respiratory distress and was intubated.   ASSESSMENT / PLAN:  PULMONARY A:  Acute hypercarbic and hypoxic respiratory failure, likely due to underlying COPD +/- pneumonia and/or CHF PH of 7.15 on venturi mask, pCO2 > 100, pO2 65  P:   - ETT in appropriate position - ABG with improved  but persistent respiratory acidosis. -NPO -intubated (pt does not want bipap)- continue vent assistance -f/u lactic acid - treating pneumonia as below- see ID  CARDIOVASCULAR  A:  HTN CAD Afib   BNP 193   P:  -trending troponins  -heparin gtt  -resumed metoprolol - hold imdur for now with hypotension issues   RENAL A:    No active issues P:     GASTROINTESTINAL A:   No active issues  P:   - PPI protonix for stress ulcer ppx  HEMATOLOGIC A:   HgB stable- at 11  P:  -monitor CBC -heparin gtt for history of afib   INFECTIOUS A:   Leukocytosis, in the setting of  pneumonia and steroid use - abx as above Pneumonia can not be excluded  P:   -Ceftriaxone  -Azithromycin -follow up pan cultures -f/u legionella and strep antigen    ENDOCRINE A:   T2DM, per family recent A1c is 5.7. At home on tresiba and novolog P:   -SSI- S -may need to add back basal insulin     NEUROLOGIC A:   -sedated  P:   RASS goal: -1 to -2 -daily WUA   Family: was updated at bedside- husband and son   Interdisciplinary meeting: 1 week from admission   Signed Burgess Estelle PGY1 Internal Medicine  I have discussed the patient with the resident and agree with the plan as stated above.  I have corrected the note as necessary.     Meribeth Mattes, DO., MS [facility]   05/21/2015, 11:13 PM

## 2015-05-21 NOTE — Progress Notes (Signed)
Pharmacy Antibiotic Note  [name] is a 60 y.o. female admitted on 05/21/2015 with sepsis.  Pharmacy has been consulted for vancomycin and cefepime dosing. Unable to obtain a temperature so far but WBC is elevated. SCr and lactic acid are WNL.   Plan: - Vancomycin 2gm IV x 1 then '1250mg'$  IV Q12H - Cefepime 2gm IV x 1 then 1gm IV Q8H - F/u renal fxn, C&S, clinical status and trough at SS  Height: '5\' 4"'$  (162.6 cm) Weight: 225 lb (102.059 kg) IBW/kg (Calculated) : 54.7  No data recorded.   Recent Labs Lab 05/21/15 2133 05/21/15 2153  WBC 13.7*  --   CREATININE 0.51  --   LATICACIDVEN  --  0.94    Estimated Creatinine Clearance: 85.9 mL/min (by C-G formula based on Cr of 0.51).    Allergies  Allergen Reactions  . Ciprofloxacin Rash    REACTION: hives/rash    Antimicrobials this admission: Vanc 4/14>> Cefepime 4/14>>  Dose adjustments this admission: N/A  Microbiology results: Pending  Thank you for allowing pharmacy to be a part of this patient's care.  [name], Rande Lawman 05/21/2015 10:55 PM

## 2015-05-21 NOTE — ED Notes (Signed)
ICU MD's in room. Family back in room at this time

## 2015-05-21 NOTE — ED Provider Notes (Signed)
CSN: [id number]     Arrival date & time 05/21/15  2057 History   First MD Initiated Contact with Patient 05/21/15 2130     Chief Complaint  Patient presents with  . Respiratory Distress     (Consider location/radiation/quality/duration/timing/severity/associated sxs/prior Treatment) Patient is a 60 y.o. female presenting with altered mental status. The history is provided by the patient and the spouse.  Altered Mental Status Presenting symptoms: confusion and disorientation   Severity:  Severe Most recent episode:  Today Episode history:  Single Duration:  1 day Timing:  Constant Progression:  Worsening Chronicity:  New Context comment:  Patient was recently treated for COPD exacerbation and also with symptoms of a UTI for 24 hours Associated symptoms: difficulty breathing and slurred speech   Associated symptoms: no abdominal pain, no fever, no headaches, no nausea and no rash     Past Medical History  Diagnosis Date  . Diabetes mellitus without complication ([place])   . COPD (chronic obstructive pulmonary disease) ([place])   . Acute MI ([place])   . Coronary artery disease   . Shortness of breath   . Stroke [facility]) TIA   . Arthritis    Past Surgical History  Procedure Laterality Date  . Abdominal hysterectomy    . Coronary artery bypass graft    . Cardiac catheterization     Family History  Problem Relation Age of Onset  . Lung cancer Brother   . Emphysema Brother   . COPD Sister   . Heart attack Sister   . Diabetes Brother   . Lung cancer Brother   . Emphysema Sister    Social History  Substance Use Topics  . Smoking status: Former Smoker -- 3.00 packs/day for 40 years    Types: Cigarettes    Quit date: 02/06/2009  . Smokeless tobacco: Never Used  . Alcohol Use: No   OB History    No data available     Review of Systems  Constitutional: Positive for chills and fatigue. Negative for fever.  HENT: Negative for congestion and rhinorrhea.   Respiratory: Positive  for cough, shortness of breath and wheezing.   Cardiovascular: Positive for leg swelling. Negative for chest pain.  Gastrointestinal: Negative for nausea, abdominal pain and diarrhea.  Genitourinary: Positive for dysuria and frequency.  Musculoskeletal: Negative for neck pain.  Skin: Negative for rash.  Allergic/Immunologic: Negative for immunocompromised state.  Neurological: Negative for syncope and headaches.  Psychiatric/Behavioral: Positive for confusion.      Allergies  Ciprofloxacin  Home Medications   Prior to Admission medications   Medication Sig Start Date End Date Taking? Authorizing Provider  ALPRAZolam (XANAX) 0.25 MG tablet Take 0.25 mg by mouth 2 (two) times daily as needed. anxiety   Yes Historical Provider, MD  atorvastatin (LIPITOR) 20 MG tablet Take 1 tablet (20 mg total) by mouth daily. 02/25/13  Yes Elsie Stain, MD  budesonide (PULMICORT) 0.5 MG/2ML nebulizer solution Take 0.5 mg by nebulization 2 (two) times daily.    Yes Historical Provider, MD  insulin aspart (NOVOLOG FLEXPEN) 100 UNIT/ML SOPN FlexPen Inject 6 Units into the skin 3 (three) times daily with meals. Patient taking differently: Inject 20 Units into the skin 3 (three) times daily with meals.  12/18/12  Yes Costin Karlyne Greenspan, MD  Insulin Degludec (TRESIBA FLEXTOUCH) 100 UNIT/ML SOPN Inject 20 Units into the skin daily.   Yes Historical Provider, MD  Insulin Pen Needle 32G X 4 MM MISC 1 each by Does not apply route  as needed. 12/18/12  Yes Costin Karlyne Greenspan, MD  Ipratropium-Albuterol (COMBIVENT RESPIMAT) 20-100 MCG/ACT AERS respimat Inhale 1-2 puffs into the lungs every 4 (four) hours as needed for wheezing. 02/25/13  Yes Elsie Stain, MD  ipratropium-albuterol (DUONEB) 0.5-2.5 (3) MG/3ML SOLN Take 3 mLs by nebulization 4 (four) times daily.    Yes Historical Provider, MD  isosorbide mononitrate (IMDUR) 60 MG 24 hr tablet Take 1 tablet (60 mg total) by mouth daily. 02/25/13  Yes Elsie Stain, MD   metoprolol tartrate (LOPRESSOR) 25 MG tablet Take 1 tablet (25 mg total) by mouth 2 (two) times daily. 02/25/13  Yes Elsie Stain, MD  predniSONE (DELTASONE) 20 MG tablet Take 3 tablets (60 mg total) by mouth daily. Patient taking differently: Take 40 mg by mouth daily.  09/03/13  Yes Linton Flemings, MD  PROAIR HFA 108 (90 BASE) MCG/ACT inhaler Inhale 1 puff into the lungs every 4 (four) hours as needed for wheezing or shortness of breath.  11/29/12  Yes Historical Provider, MD  Rivaroxaban (XARELTO) 20 MG TABS tablet Take 1 tablet (20 mg total) by mouth daily. 02/25/13  Yes Elsie Stain, MD   BP 129/56 mmHg  Pulse 127  Resp 17  Ht '5\' 4"'$  (1.626 m)  Wt 102.059 kg  BMI 38.60 kg/m2  SpO2 98% Physical Exam  Constitutional: She appears well-developed and well-nourished. She appears distressed.  Disheveled  HENT:  Head: Normocephalic.  Mouth/Throat: No oropharyngeal exudate.  Eyes: Conjunctivae are normal. Pupils are equal, round, and reactive to light.  Neck: Neck supple. No tracheal deviation present.  Cardiovascular: Normal heart sounds and intact distal pulses.  Exam reveals no friction rub.   No murmur heard. Tachycardia  Pulmonary/Chest: She is in respiratory distress.  Moderate respiratory distress with tachypnea and diffuse wheezing with prolonged expiratory phase  Abdominal: Soft. She exhibits no distension. There is no tenderness. There is no guarding.  Musculoskeletal: She exhibits no edema.  Neurological: She is alert.  Confused, drowsy but with no focal neurological deficits. Moves all extremities with 5 out of 5 strength. Face is symmetric. Tongue protrusion is midline and there is no pooling of secretions in the posterior pharynx.  Skin: Skin is warm. No rash noted.  Nursing note and vitals reviewed.   ED Course  .Intubation Date/Time: 05/22/2015 2:32 AM Performed by: Duffy Bruce Authorized by: Duffy Bruce Consent: The procedure was performed in an emergent  situation. Risks and benefits: risks, benefits and alternatives were discussed Required items: required blood products, implants, devices, and special equipment available Patient identity confirmed: arm band Time out: Immediately prior to procedure a "time out" was called to verify the correct patient, procedure, equipment, support staff and site/side marked as required. Indications: respiratory failure and  hypercapnia Intubation method: video-assisted Patient status: paralyzed (RSI) Preoxygenation: Nonrebreather mask and nasal cannula. Sedatives: ketmine Paralytic: succinylcholine Laryngoscope size: Glidescope 3. Tube size: 7.5 mm Tube type: cuffed Number of attempts: 1 Cords visualized: yes Post-procedure assessment: chest rise and ETCO2 monitor Breath sounds: equal Cuff inflated: yes ETT to lip: 23 cm Tube secured with: ETT holder Chest x-ray findings: endotracheal tube in appropriate position Patient tolerance: Patient tolerated the procedure well with no immediate complications   (including critical care time) Labs Review Labs Reviewed  COMPREHENSIVE METABOLIC PANEL - Abnormal; Notable for the following:    Chloride 92 (*)    CO2 46 (*)    Albumin 2.7 (*)    AST 12 (*)    ALT 13 (*)  All other components within normal limits  CBC WITH DIFFERENTIAL/PLATELET - Abnormal; Notable for the following:    WBC 13.7 (*)    Hemoglobin 11.7 (*)    MCV 104.1 (*)    MCHC 27.1 (*)    Neutro Abs 12.3 (*)    All other components within normal limits  BRAIN NATRIURETIC PEPTIDE - Abnormal; Notable for the following:    B Natriuretic Peptide 193.2 (*)    All other components within normal limits  BLOOD GAS, ARTERIAL - Abnormal; Notable for the following:    pH, Arterial 7.155 (*)    pO2, Arterial 65 (*)    Bicarbonate 51.3 (*)    Acid-Base Excess 22.0 (*)    All other components within normal limits  CULTURE, BLOOD (ROUTINE X 2)  CULTURE, BLOOD (ROUTINE X 2)  URINE CULTURE   LIPASE, BLOOD  URINALYSIS, ROUTINE W REFLEX MICROSCOPIC (NOT AT [facility])  TRIGLYCERIDES  MAGNESIUM  I-STAT CG4 LACTIC ACID, ED  I-STAT ARTERIAL BLOOD GAS, ED    Imaging Review No results found. I have personally reviewed and evaluated these images and lab results as part of my medical decision-making.   EKG Interpretation   Date/Time:  Friday May 21 2015 21:10:56 EDT Ventricular Rate:  119 PR Interval:    QRS Duration: 95 QT Interval:  305 QTC Calculation: 429 R Axis:   57 Text Interpretation:  sinus tachycardia Minimal ST depression, lateral  leads Nonspecific ST and T wave abnormality No acute changes Confirmed by  Kathrynn Humble, MD, Thelma Comp [phone]) on 05/21/2015 10:53:01 PM      MDM   60 yo F with PMHx of COPD, chronic hypoxic respiratory failure on 5L [place], DM2, CAD who p/w a 2-week h/o SOB and now 1 day h/o AMS, lethargy, with foul smelling urine and dysuria. On arrival, pt tachycardic, normotensive, altered and in moderate respiratory distress. Steroids given en route with breathing treatments. Initial DDx includes severe sepsis 2/2 PNA, COPD exacerbation, possible urosepsis. EKG is non-ischemic. Concern for hypercapnea given drowsiness - discussed with husband. Pt refuses BIPAP in the past, but is otherwise full code. Will check stat gas, broad labs, start IVF and Vanc/Cefepime for severe sepsis of unknown etiology.  Blood gas with pH 7.1, pCO2 incalculable, concerning for acute on chronic hypercapneic respiratory failure. Pt altered, moaning on exam. Pt subsequently intubated as above, tolerated well. Post-intubation CXR confirms ETT placement, shows no focal PNA. BP, HR Improving after intubation. Labs o/w as above, with lactic acidosis and leukocytosis. Will admit to ICU, continue IVF and ABX. Steroids, breathing treatments, and Mag given for COPD exacerbation. Repeat gas pending.  Clinical Impression: 1. Acute on chronic respiratory failure with hypoxia and hypercapnia (HCC)    2. Respiratory distress   3. Severe sepsis ([place])   4. Leukocytosis   5. Altered mental status, unspecified altered mental status type     Disposition: Admit  Condition: Critical but stable  Pt seen in conjunction with Dr. Hassan Buckler, MD 05/22/15 [place], MD 05/25/15 0973

## 2015-05-22 ENCOUNTER — Inpatient Hospital Stay (HOSPITAL_COMMUNITY): Payer: 59

## 2015-05-22 ENCOUNTER — Encounter (HOSPITAL_COMMUNITY): Payer: Self-pay | Admitting: *Deleted

## 2015-05-22 DIAGNOSIS — Z7951 Long term (current) use of inhaled steroids: Secondary | ICD-10-CM | POA: Diagnosis not present

## 2015-05-22 DIAGNOSIS — D6489 Other specified anemias: Secondary | ICD-10-CM | POA: Diagnosis present

## 2015-05-22 DIAGNOSIS — J9622 Acute and chronic respiratory failure with hypercapnia: Secondary | ICD-10-CM | POA: Diagnosis not present

## 2015-05-22 DIAGNOSIS — J9602 Acute respiratory failure with hypercapnia: Secondary | ICD-10-CM | POA: Diagnosis present

## 2015-05-22 DIAGNOSIS — I252 Old myocardial infarction: Secondary | ICD-10-CM | POA: Diagnosis not present

## 2015-05-22 DIAGNOSIS — E119 Type 2 diabetes mellitus without complications: Secondary | ICD-10-CM | POA: Diagnosis present

## 2015-05-22 DIAGNOSIS — Z794 Long term (current) use of insulin: Secondary | ICD-10-CM | POA: Diagnosis not present

## 2015-05-22 DIAGNOSIS — G9341 Metabolic encephalopathy: Secondary | ICD-10-CM | POA: Diagnosis present

## 2015-05-22 DIAGNOSIS — I2510 Atherosclerotic heart disease of native coronary artery without angina pectoris: Secondary | ICD-10-CM | POA: Diagnosis present

## 2015-05-22 DIAGNOSIS — E872 Acidosis: Secondary | ICD-10-CM | POA: Diagnosis present

## 2015-05-22 DIAGNOSIS — T380X5A Adverse effect of glucocorticoids and synthetic analogues, initial encounter: Secondary | ICD-10-CM | POA: Diagnosis present

## 2015-05-22 DIAGNOSIS — Z87891 Personal history of nicotine dependence: Secondary | ICD-10-CM | POA: Diagnosis not present

## 2015-05-22 DIAGNOSIS — J9620 Acute and chronic respiratory failure, unspecified whether with hypoxia or hypercapnia: Secondary | ICD-10-CM | POA: Diagnosis not present

## 2015-05-22 DIAGNOSIS — E46 Unspecified protein-calorie malnutrition: Secondary | ICD-10-CM | POA: Diagnosis present

## 2015-05-22 DIAGNOSIS — E785 Hyperlipidemia, unspecified: Secondary | ICD-10-CM | POA: Diagnosis present

## 2015-05-22 DIAGNOSIS — I4891 Unspecified atrial fibrillation: Secondary | ICD-10-CM | POA: Diagnosis present

## 2015-05-22 DIAGNOSIS — J9621 Acute and chronic respiratory failure with hypoxia: Secondary | ICD-10-CM | POA: Diagnosis not present

## 2015-05-22 DIAGNOSIS — E876 Hypokalemia: Secondary | ICD-10-CM | POA: Diagnosis not present

## 2015-05-22 DIAGNOSIS — Z7901 Long term (current) use of anticoagulants: Secondary | ICD-10-CM | POA: Diagnosis not present

## 2015-05-22 DIAGNOSIS — I10 Essential (primary) hypertension: Secondary | ICD-10-CM | POA: Diagnosis not present

## 2015-05-22 DIAGNOSIS — Z6837 Body mass index (BMI) 37.0-37.9, adult: Secondary | ICD-10-CM | POA: Diagnosis not present

## 2015-05-22 DIAGNOSIS — R300 Dysuria: Secondary | ICD-10-CM | POA: Diagnosis not present

## 2015-05-22 DIAGNOSIS — Z9981 Dependence on supplemental oxygen: Secondary | ICD-10-CM | POA: Diagnosis not present

## 2015-05-22 DIAGNOSIS — Z7952 Long term (current) use of systemic steroids: Secondary | ICD-10-CM | POA: Diagnosis not present

## 2015-05-22 DIAGNOSIS — I110 Hypertensive heart disease with heart failure: Secondary | ICD-10-CM | POA: Diagnosis present

## 2015-05-22 DIAGNOSIS — Z8673 Personal history of transient ischemic attack (TIA), and cerebral infarction without residual deficits: Secondary | ICD-10-CM | POA: Diagnosis not present

## 2015-05-22 DIAGNOSIS — J441 Chronic obstructive pulmonary disease with (acute) exacerbation: Secondary | ICD-10-CM | POA: Diagnosis not present

## 2015-05-22 DIAGNOSIS — J440 Chronic obstructive pulmonary disease with acute lower respiratory infection: Secondary | ICD-10-CM | POA: Diagnosis present

## 2015-05-22 DIAGNOSIS — J189 Pneumonia, unspecified organism: Secondary | ICD-10-CM | POA: Diagnosis present

## 2015-05-22 DIAGNOSIS — R4182 Altered mental status, unspecified: Secondary | ICD-10-CM | POA: Diagnosis present

## 2015-05-22 LAB — BASIC METABOLIC PANEL
Anion gap: 10 (ref 5–15)
BUN: 7 mg/dL (ref 6–20)
CALCIUM: 7.8 mg/dL — AB (ref 8.9–10.3)
CHLORIDE: 98 mmol/L — AB (ref 101–111)
CO2: 36 mmol/L — AB (ref 22–32)
CREATININE: 0.64 mg/dL (ref 0.44–1.00)
GFR calc non Af Amer: >60 mL/min (ref >60)
Glucose, Bld: 246 mg/dL — ABNORMAL HIGH (ref 65–99)
Potassium: 4.1 mmol/L (ref 3.5–5.1)
SODIUM: 144 mmol/L (ref 135–145)

## 2015-05-22 LAB — APTT
aPTT: 39 seconds — ABNORMAL HIGH (ref 24–37)
aPTT: 66 seconds — ABNORMAL HIGH (ref 24–37)

## 2015-05-22 LAB — GLUCOSE, CAPILLARY
GLUCOSE-CAPILLARY: 247 mg/dL — AB (ref 65–99)
Glucose-Capillary: 174 mg/dL — ABNORMAL HIGH (ref 65–99)
Glucose-Capillary: 198 mg/dL — ABNORMAL HIGH (ref 65–99)
Glucose-Capillary: 237 mg/dL — ABNORMAL HIGH (ref 65–99)
Glucose-Capillary: 242 mg/dL — ABNORMAL HIGH (ref 65–99)
Glucose-Capillary: 278 mg/dL — ABNORMAL HIGH (ref 65–99)

## 2015-05-22 LAB — CBC WITH DIFFERENTIAL/PLATELET
BASOS PCT: 0 %
Basophils Absolute: 0 10*3/uL (ref 0.0–0.1)
EOS ABS: 0 10*3/uL (ref 0.0–0.7)
Eosinophils Relative: 0 %
HEMATOCRIT: 36.3 % (ref 36.0–46.0)
HEMOGLOBIN: 9.6 g/dL — AB (ref 12.0–15.0)
LYMPHS ABS: 0.4 10*3/uL — AB (ref 0.7–4.0)
Lymphocytes Relative: 4 %
MCH: 27 pg (ref 26.0–34.0)
MCHC: 26.4 g/dL — AB (ref 30.0–36.0)
MCV: 102.3 fL — ABNORMAL HIGH (ref 78.0–100.0)
MONO ABS: 0.3 10*3/uL (ref 0.1–1.0)
MONOS PCT: 3 %
NEUTROS PCT: 93 %
Neutro Abs: 10.5 10*3/uL — ABNORMAL HIGH (ref 1.7–7.7)
Platelets: 141 10*3/uL — ABNORMAL LOW (ref 150–400)
RBC: 3.55 MIL/uL — ABNORMAL LOW (ref 3.87–5.11)
RDW: 13.7 % (ref 11.5–15.5)
WBC: 11.3 10*3/uL — ABNORMAL HIGH (ref 4.0–10.5)

## 2015-05-22 LAB — RAPID URINE DRUG SCREEN, HOSP PERFORMED
AMPHETAMINES: NOT DETECTED
Barbiturates: NOT DETECTED
Benzodiazepines: NOT DETECTED
COCAINE: NOT DETECTED
OPIATES: NOT DETECTED
Tetrahydrocannabinol: NOT DETECTED

## 2015-05-22 LAB — STREP PNEUMONIAE URINARY ANTIGEN: STREP PNEUMO URINARY ANTIGEN: NEGATIVE

## 2015-05-22 LAB — MRSA PCR SCREENING: MRSA BY PCR: NEGATIVE

## 2015-05-22 LAB — URINALYSIS, ROUTINE W REFLEX MICROSCOPIC
Bilirubin Urine: NEGATIVE
Glucose, UA: 100 mg/dL — AB
Hgb urine dipstick: NEGATIVE
Ketones, ur: 15 mg/dL — AB
Leukocytes, UA: NEGATIVE
Nitrite: NEGATIVE
Protein, ur: NEGATIVE mg/dL
SPECIFIC GRAVITY, URINE: 1.022 (ref 1.005–1.030)
pH: 5.5 (ref 5.0–8.0)

## 2015-05-22 LAB — PROTIME-INR
INR: 1.23 (ref 0.00–1.49)
Prothrombin Time: 15.6 seconds — ABNORMAL HIGH (ref 11.6–15.2)

## 2015-05-22 LAB — I-STAT CG4 LACTIC ACID, ED: LACTIC ACID, VENOUS: 1.55 mmol/L (ref 0.5–2.0)

## 2015-05-22 LAB — HEPARIN LEVEL (UNFRACTIONATED): HEPARIN UNFRACTIONATED: 0.54 [IU]/mL (ref 0.30–0.70)

## 2015-05-22 LAB — TROPONIN I: Troponin I: <0.03 ng/mL (ref <0.031)

## 2015-05-22 MED ORDER — VITAL HIGH PROTEIN PO LIQD: 1000.0000 mL | ORAL | Status: DC

## 2015-05-22 MED ORDER — METHYLPREDNISOLONE SODIUM SUCC 40 MG IJ SOLR
40.0000 mg | Freq: Two times a day (BID) | INTRAMUSCULAR | Status: DC
  Administered 2015-05-22 (×2): 40 mg via INTRAVENOUS
  Filled 2015-05-22 (×3): qty 1

## 2015-05-22 MED ORDER — SODIUM CHLORIDE 0.9 % IV SOLN: 250.0000 mL | INTRAVENOUS | Status: DC | PRN

## 2015-05-22 MED ORDER — DEXTROSE 5 % IV SOLN
500.0000 mg | INTRAVENOUS | Status: DC
  Administered 2015-05-22 – 2015-05-25 (×4): 500 mg via INTRAVENOUS
  Filled 2015-05-22 (×4): qty 500

## 2015-05-22 MED ORDER — PRO-STAT SUGAR FREE PO LIQD
60.0000 mL | Freq: Three times a day (TID) | ORAL | Status: DC
  Administered 2015-05-22 – 2015-05-24 (×7): 60 mL
  Filled 2015-05-22 (×6): qty 60

## 2015-05-22 MED ORDER — HYDRALAZINE HCL 20 MG/ML IJ SOLN
10.0000 mg | INTRAMUSCULAR | Status: DC | PRN
  Administered 2015-05-22: 10 mg via INTRAVENOUS
  Filled 2015-05-22: qty 1

## 2015-05-22 MED ORDER — INSULIN ASPART 100 UNIT/ML ~~LOC~~ SOLN
0.0000 [IU] | SUBCUTANEOUS | Status: DC
  Administered 2015-05-22: 4 [IU] via SUBCUTANEOUS
  Administered 2015-05-22 (×2): 7 [IU] via SUBCUTANEOUS
  Administered 2015-05-22: 11 [IU] via SUBCUTANEOUS
  Administered 2015-05-23 (×3): 7 [IU] via SUBCUTANEOUS
  Administered 2015-05-23 (×2): 4 [IU] via SUBCUTANEOUS
  Administered 2015-05-23: 7 [IU] via SUBCUTANEOUS
  Administered 2015-05-24 (×2): 4 [IU] via SUBCUTANEOUS
  Administered 2015-05-24 (×2): 7 [IU] via SUBCUTANEOUS
  Administered 2015-05-24 – 2015-05-25 (×3): 4 [IU] via SUBCUTANEOUS
  Administered 2015-05-25: 3 [IU] via SUBCUTANEOUS

## 2015-05-22 MED ORDER — ATORVASTATIN CALCIUM 10 MG PO TABS
20.0000 mg | ORAL_TABLET | Freq: Every day | ORAL | Status: DC
  Administered 2015-05-22 – 2015-05-26 (×4): 20 mg
  Filled 2015-05-22 (×2): qty 1
  Filled 2015-05-22: qty 2
  Filled 2015-05-22 (×2): qty 1

## 2015-05-22 MED ORDER — ANTISEPTIC ORAL RINSE SOLUTION (CORINZ)
7.0000 mL | Freq: Four times a day (QID) | OROMUCOSAL | Status: DC
  Administered 2015-05-22 – 2015-05-25 (×12): 7 mL via OROMUCOSAL

## 2015-05-22 MED ORDER — SODIUM CHLORIDE 0.9% FLUSH: 3.0000 mL | INTRAVENOUS | Status: DC | PRN

## 2015-05-22 MED ORDER — IPRATROPIUM-ALBUTEROL 0.5-2.5 (3) MG/3ML IN SOLN
3.0000 mL | RESPIRATORY_TRACT | Status: DC
  Administered 2015-05-22 (×2): 3 mL via RESPIRATORY_TRACT
  Filled 2015-05-22 (×2): qty 3

## 2015-05-22 MED ORDER — BUDESONIDE 0.5 MG/2ML IN SUSP
0.5000 mg | Freq: Two times a day (BID) | RESPIRATORY_TRACT | Status: DC
  Administered 2015-05-22 – 2015-05-27 (×11): 0.5 mg via RESPIRATORY_TRACT
  Filled 2015-05-22 (×12): qty 2

## 2015-05-22 MED ORDER — IPRATROPIUM-ALBUTEROL 0.5-2.5 (3) MG/3ML IN SOLN
3.0000 mL | RESPIRATORY_TRACT | Status: DC | PRN
  Administered 2015-05-24 (×2): 3 mL via RESPIRATORY_TRACT
  Filled 2015-05-22 (×3): qty 3

## 2015-05-22 MED ORDER — ACETAMINOPHEN 160 MG/5ML PO SOLN: 650.0000 mg | Freq: Four times a day (QID) | ORAL | Status: DC | PRN

## 2015-05-22 MED ORDER — IPRATROPIUM-ALBUTEROL 0.5-2.5 (3) MG/3ML IN SOLN: 3.0000 mL | Freq: Four times a day (QID) | RESPIRATORY_TRACT | Status: DC

## 2015-05-22 MED ORDER — ACETAMINOPHEN 650 MG RE SUPP: 650.0000 mg | Freq: Four times a day (QID) | RECTAL | Status: DC | PRN

## 2015-05-22 MED ORDER — HEPARIN (PORCINE) IN NACL 100-0.45 UNIT/ML-% IJ SOLN
1400.0000 [IU]/h | INTRAMUSCULAR | Status: DC
  Administered 2015-05-22: 1200 [IU]/h via INTRAVENOUS
  Administered 2015-05-23 – 2015-05-25 (×4): 1400 [IU]/h via INTRAVENOUS
  Filled 2015-05-22 (×6): qty 250

## 2015-05-22 MED ORDER — CHLORHEXIDINE GLUCONATE 0.12% ORAL RINSE (MEDLINE KIT)
15.0000 mL | Freq: Two times a day (BID) | OROMUCOSAL | Status: DC
  Administered 2015-05-22 – 2015-05-25 (×6): 15 mL via OROMUCOSAL

## 2015-05-22 MED ORDER — PANTOPRAZOLE SODIUM 40 MG PO PACK
40.0000 mg | PACK | ORAL | Status: DC
  Administered 2015-05-22 – 2015-05-23 (×2): 40 mg
  Filled 2015-05-22 (×3): qty 20

## 2015-05-22 MED ORDER — METOPROLOL TARTRATE 25 MG PO TABS
25.0000 mg | ORAL_TABLET | Freq: Two times a day (BID) | ORAL | Status: DC
  Administered 2015-05-22: 25 mg via ORAL
  Filled 2015-05-22: qty 1

## 2015-05-22 MED ORDER — DEXTROSE 5 % IV SOLN
2.0000 g | INTRAVENOUS | Status: DC
  Administered 2015-05-23 – 2015-05-25 (×3): 2 g via INTRAVENOUS
  Filled 2015-05-22 (×4): qty 2

## 2015-05-22 MED ORDER — ACETAMINOPHEN 325 MG PO TABS
650.0000 mg | ORAL_TABLET | Freq: Four times a day (QID) | ORAL | Status: DC | PRN
  Administered 2015-05-22: 650 mg via ORAL
  Filled 2015-05-22: qty 2

## 2015-05-22 MED ORDER — LABETALOL HCL 5 MG/ML IV SOLN
10.0000 mg | INTRAVENOUS | Status: DC | PRN
  Filled 2015-05-22: qty 4

## 2015-05-22 MED ORDER — ISOSORBIDE MONONITRATE ER 30 MG PO TB24: 60.0000 mg | ORAL_TABLET | Freq: Every day | ORAL | Status: DC

## 2015-05-22 MED ORDER — ARFORMOTEROL TARTRATE 15 MCG/2ML IN NEBU
15.0000 ug | INHALATION_SOLUTION | Freq: Two times a day (BID) | RESPIRATORY_TRACT | Status: DC
  Administered 2015-05-22 – 2015-05-27 (×11): 15 ug via RESPIRATORY_TRACT
  Filled 2015-05-22 (×11): qty 2

## 2015-05-22 MED ORDER — PREDNISONE 20 MG PO TABS
40.0000 mg | ORAL_TABLET | Freq: Every day | ORAL | Status: DC
  Administered 2015-05-22: 40 mg via ORAL
  Filled 2015-05-22: qty 2

## 2015-05-22 MED ORDER — SODIUM CHLORIDE 0.9% FLUSH
3.0000 mL | Freq: Two times a day (BID) | INTRAVENOUS | Status: DC
  Administered 2015-05-22 – 2015-05-27 (×10): 3 mL via INTRAVENOUS

## 2015-05-22 MED ORDER — VITAL HIGH PROTEIN PO LIQD
1000.0000 mL | ORAL | Status: DC
  Administered 2015-05-22 – 2015-05-23 (×2): 1000 mL

## 2015-05-22 MED ORDER — BUDESONIDE 0.5 MG/2ML IN SUSP
0.5000 mg | Freq: Two times a day (BID) | RESPIRATORY_TRACT | Status: DC
Start: 2015-05-22 — End: 2015-05-22

## 2015-05-22 MED ORDER — ATORVASTATIN CALCIUM 20 MG PO TABS: 20.0000 mg | ORAL_TABLET | Freq: Every day | ORAL | Status: DC

## 2015-05-22 MED ORDER — PANTOPRAZOLE SODIUM 40 MG IV SOLR
40.0000 mg | Freq: Every day | INTRAVENOUS | Status: DC
  Administered 2015-05-22: 40 mg via INTRAVENOUS
  Filled 2015-05-22: qty 40

## 2015-05-22 MED ORDER — VITAL HIGH PROTEIN PO LIQD
1000.0000 mL | ORAL | Status: DC
  Administered 2015-05-22: 1000 mL

## 2015-05-22 MED ORDER — METOPROLOL TARTRATE 25 MG/10 ML ORAL SUSPENSION
50.0000 mg | Freq: Two times a day (BID) | ORAL | Status: DC
  Administered 2015-05-23 – 2015-05-26 (×7): 50 mg
  Filled 2015-05-22 (×11): qty 20

## 2015-05-22 NOTE — Progress Notes (Signed)
PULMONARY / CRITICAL CARE MEDICINE   Name: [name] MRN: [id number] DOB: 18-Mar-1955    ADMISSION DATE:  05/21/2015  REFERRING MD:  ER  CHIEF COMPLAINT:  Short of breath  SUBJECTIVE:  Remains on full vent support.  VITAL SIGNS: BP 112/54 mmHg  Pulse 105  Temp(Src) 100.1 F (37.8 C) (Axillary)  Resp 17  Ht '5\' 5"'$  (1.651 m)  Wt 231 lb 11.3 oz (105.1 kg)  BMI 38.56 kg/m2  SpO2 96%  VENTILATOR SETTINGS: Vent Mode:  [-] PRVC FiO2 (%):  [60 %-100 %] 60 % Set Rate:  [18 bmp] 18 bmp Vt Set:  [440 mL] 440 mL PEEP:  [5 cmH20] 5 cmH20 Plateau Pressure:  [15 cmH20-30 cmH20] 15 cmH20  INTAKE / OUTPUT: I/O last 3 completed shifts: In: 3542.3 [I.V.:3542.3] Out: 400 [Urine:400]  PHYSICAL EXAMINATION: General:  sedated Neuro:  Opens eyes with stimulation HEENT:  ETT in place Cardiovascular:  Regular, no murmur Lungs:  Decreased BS, no wheeze Abdomen:  Soft, non tender Musculoskeletal:  No edema Skin:  Chronic redness around neck and chest  LABS:  BMET  Recent Labs Lab 05/21/15 2133 05/22/15 0553  NA 144 144  K 3.9 4.1  CL 92* 98*  CO2 46* 36*  BUN 8 7  CREATININE 0.51 0.64  GLUCOSE 90 246*    Electrolytes  Recent Labs Lab 05/21/15 2133 05/22/15 0553  CALCIUM 8.9 7.8*  MG 1.7  --     CBC  Recent Labs Lab 05/21/15 2133 05/22/15 0553  WBC 13.7* 11.3*  HGB 11.7* 9.6*  HCT 43.2 36.3  PLT 150 141*    Coag's  Recent Labs Lab 05/22/15 0553 05/22/15 0554  APTT  --  39*  INR 1.23  --     Sepsis Markers  Recent Labs Lab 05/21/15 2153 05/22/15 0059  LATICACIDVEN 0.94 1.55    ABG  Recent Labs Lab 05/21/15 2155 05/21/15 2336  PHART 7.155* 7.306*  PCO2ART VALUE ABOVE REPORTABLE RANGE. 88.4*  PO2ART 65* 221.0*    Liver Enzymes  Recent Labs Lab 05/21/15 2133  AST 12*  ALT 13*  ALKPHOS 65  BILITOT 0.9  ALBUMIN 2.7*    Cardiac Enzymes  Recent Labs Lab 05/22/15 0552  TROPONINI <0.03    Glucose  Recent Labs Lab  05/22/15 0342 05/22/15 0745  GLUCAP 174* 247*    Imaging Dg Chest Port 1 View  05/22/2015  CLINICAL DATA:  Respiratory failure. EXAM: PORTABLE CHEST 1 VIEW COMPARISON:  05/21/2015. FINDINGS: The cardiac silhouette is borderline enlarged, with a mild decrease in size. Decreased inspiration with increased prominence of the pulmonary vasculature and interstitial markings. Minimal patchy opacity at at the right lung base. Interval dense consolidation in the left lower lobe. Diffuse osteopenia. Endotracheal tube in satisfactory position. Nasogastric tube extending into the stomach. IMPRESSION: 1. Decreased inspiration with crowding of the lung markings. Taking this into account, there has probably been a mild increase in pulmonary vascular congestion with possible interval mild interstitial pulmonary edema superimposed on chronic interstitial lung disease. 2. Minimal patchy opacity at the right lung base and more confluent increased density in the left lower lobe. This could represent pneumonia or patchy atelectasis. Electronically Signed   By: Claudie Revering [name]   On: 05/22/2015 09:22   Dg Chest Port 1 View  05/21/2015  CLINICAL DATA:  Endotracheal tube and nasogastric tube placement. EXAM: PORTABLE CHEST 1 VIEW COMPARISON:  09/03/2013. FINDINGS: Endotracheal tube terminates approximately 6.8 cm above the carina. Nasogastric tube is followed into  the stomach. Heart size is accentuated by AP semi upright technique but appears stable. Thoracic aorta is calcified. Right costophrenic angle is not included on the image. There may be mild bibasilar airspace opacification, right greater than left. No definite pleural fluid. IMPRESSION: Question mild bibasilar airspace opacification, right greater than left. Pneumonia cannot be excluded. Electronically Signed   By: Lorin Picket [name]   On: 05/21/2015 23:48     STUDIES:   CULTURES: 4/14 Blood >> 4/15 Urine >> 4/15 Sputum >> 4/15 Pneumococcal Ag >> 4/15  Legionella Ag >>  ANTIBIOTICS: 4/15 Rocephin >> 4/15 Zithromax >>   SIGNIFICANT EVENTS: 4/14 Admit  LINES/TUBES: 4/14 ETT >>   DISCUSSION: 60 yo female former smoker with altered mental status and dyspnea from COPD exacerbation and CAP.  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 P:   Full vent support Solumedrol Brovana, pulmicort, and prn duoneb  CARDIOVASCULAR A:  Hx of CAD, HTN, HLD. P:  Continue lipitor, lopressor PRN hydralazine to keep SBP < 170 Hold outpt imdur for now   RENAL A:   No acute issues. P:   Monitor renal fx, urine outpt  GASTROINTESTINAL A:   Nutrition. P:   Tube feeds while on vent Protonix for SUP  HEMATOLOGIC A:   Anemia of critical illness. P:  F/u CBC  INFECTIOUS A:   CAP. P:   Day 2 of Abx, currently on rocephin/zithromax  ENDOCRINE A:   DM with steroid induced hyperglycemia.   P:   SSI Hold outpt tresiba  NEUROLOGIC A:   Acute metabolic encephalopathy. Hx of CVA >> husband reports she has been on anticoagulation for this. P:   RASS goal: -1 Heparin gtt per pharmacy in place of [facility] for now  Updated pt's husband at bedside.  CC time 37 minutes.  Chesley Mires, MD [facility] Pulmonary/Critical Care 05/22/2015, 10:28 AM Pager:  [phone] After 3pm call: [phone]

## 2015-05-22 NOTE — Progress Notes (Addendum)
Initial Nutrition Assessment  DOCUMENTATION CODES:  Obesity unspecified  INTERVENTION:  Initiate Vital HP @  15 ml/hr via NG/OGT. This will be goal rate while on high propofol rate.    60 ml Prostat TID  Tube feeding regimen provides 960 kcal +486 (1446 kcal) (101% of needs), 122 grams of protein, and 1104 ml of H2O.   NUTRITION DIAGNOSIS:  Inadequate oral intake related to inability to eat as evidenced by NPO status.  GOAL:  Provide needs based on ASPEN/SCCM guidelines  MONITOR:  Diet advancement, Vent status, Labs, I & O's  REASON FOR ASSESSMENT:  Ventilator    ASSESSMENT:  60 y/o female PMHx DM, COPD, CAD who was reported by family as having AMS+ COPD exacerbation x 2 weeks. Pt intubated on arrival.   Information obtained from family member. He reports 3 days of poorer intake than usually, but only one day up minimal intake. Pt did not take any vitamins at home  OG ouput, 50cc. Abdomen fairly distended, firm  Patient is currently intubated on ventilator support MV: 8.9 L/min Temp (24hrs), Avg:100.4 F (38 C), Min:98.7 F (37.1 C), Max:102.2 F (39 C)  Propofol: 18.4 ml/hr=486 kcals /day  Labs reviewed: Leukocytosis,    Recent Labs Lab 05/21/15 2133 05/22/15 0553  NA 144 144  K 3.9 4.1  CL 92* 98*  CO2 46* 36*  BUN 8 7  CREATININE 0.51 0.64  CALCIUM 8.9 7.8*  MG 1.7  --   GLUCOSE 90 246*   Diet Order:  Diet NPO time specified  Skin: MSAD to perineum, breasts, abdomen. Rash to neck. Generalized dry/flaky  Last BM:  Unknown-family member states yesterday  Height:  Ht Readings from Last 1 Encounters:  05/22/15 '5\' 5"'$  (1.651 m)   Weight:  Wt Readings from Last 1 Encounters:  05/22/15 231 lb 11.3 oz (105.1 kg)   Wt Readings from Last 10 Encounters:  05/22/15 231 lb 11.3 oz (105.1 kg)  09/03/13 227 lb (102.967 kg)  02/25/13 231 lb (104.781 kg)  12/18/12 231 lb 0.7 oz (104.8 kg)  03/11/08 226 lb (102.513 kg)  Admitted at 225 lbs.  (102.27  kg)  Ideal Body Weight:  56.82 kg  BMI:  Body mass index is 38.56 kg/(m^2).  Estimated Nutritional Needs:  Kcal:  1125-1430 kcal (11-14 kcal/kg bw) Protein:  >114 g (2 g/kg bw) Fluid:  1.6-1.8 liters  EDUCATION NEEDS:  No education needs identified at this time  Burtis Junes RD, LDN Clinical Nutrition Pager: [phone] 05/22/2015 9:00 AM

## 2015-05-22 NOTE — Progress Notes (Signed)
Sputum sample obtained and sent to lab by RT. 

## 2015-05-22 NOTE — Progress Notes (Signed)
Notified CCM Md about pt's intractable HTN. New orders received. Will continue to monitor.

## 2015-05-22 NOTE — Progress Notes (Signed)
Pharmacy Antibiotic Note  [name] is a 60 y.o. female admitted on 05/21/2015 with pneumonia.  Pharmacy has been consulted for Ceftriaxone dosing. WBC mildly elevated, CXR with some opacities.   Plan: -Ceftriaxone 2g IV q24h -Azithromycin per MD -Trend WBC, temp, renal function  -F/U infectious work-up  Height: '5\' 4"'$  (162.6 cm) Weight: 225 lb (102.059 kg) IBW/kg (Calculated) : 54.7  Temp (24hrs), Avg:100.2 F (37.9 C), Min:100.2 F (37.9 C), Max:100.2 F (37.9 C)   Recent Labs Lab 05/21/15 2133 05/21/15 2153 05/22/15 0059  WBC 13.7*  --   --   CREATININE 0.51  --   --   LATICACIDVEN  --  0.94 1.55    Estimated Creatinine Clearance: 85.9 mL/min (by C-G formula based on Cr of 0.51).    Allergies  Allergen Reactions  . Ciprofloxacin Rash    REACTION: hives/rash     Narda Bonds 05/22/2015 3:01 AM

## 2015-05-22 NOTE — Progress Notes (Signed)
Have been unable to get a BP that seems to be in line with her trend from earlier.  Attempted manual BP on bilateral upper extremities, however pt was shaking arms and would not be still enough to auscultate korotkoff sounds.  Brought up concern over BP to CCM Md at bedside.   Was holding metoprolol until a BP was taken that seemed more accurate. BP readings were discussed with CCM Md and Md stated to give metoprolol. Will continue to monitor.

## 2015-05-22 NOTE — ED Notes (Signed)
Pt with eyes open and moving around in the bed. This RN giving pt first q2hr prn fentanyl dose.

## 2015-05-22 NOTE — Progress Notes (Signed)
ANTICOAGULATION CONSULT NOTE - Initial Consult  Pharmacy Consult for Heparin  Indication: atrial fibrillation  Allergies  Allergen Reactions  . Ciprofloxacin Rash    REACTION: hives/rash    Patient Measurements: Height: '5\' 5"'$  (165.1 cm) Weight: 231 lb 11.3 oz (105.1 kg) IBW/kg (Calculated) : 57  Vital Signs: Temp: 99.7 F (37.6 C) (04/15 1200) Temp Source: Axillary (04/15 1200) BP: 110/60 mmHg (04/15 1511) Pulse Rate: 91 (04/15 1511)  Labs:  Recent Labs  05/21/15 2133 05/22/15 0552 05/22/15 0553 05/22/15 0554 05/22/15 1506  HGB 11.7*  --  9.6*  --   --   HCT 43.2  --  36.3  --   --   PLT 150  --  141*  --   --   APTT  --   --   --  39* 66*  LABPROT  --   --  15.6*  --   --   INR  --   --  1.23  --   --   HEPARINUNFRC  --   --  0.54  --   --   CREATININE 0.51  --  0.64  --   --   TROPONINI  --  <0.03  --   --   --     Estimated Creatinine Clearance: 88.8 mL/min (by C-G formula based on Cr of 0.64).   Medical History: Past Medical History  Diagnosis Date  . Diabetes mellitus without complication ([place])   . Acute MI ([place])   . Coronary artery disease   . Shortness of breath   . Stroke [facility]) TIA   . Arthritis   . COPD (chronic obstructive pulmonary disease) (HCC)     home O2    Assessment: 60 y/o F from home with resp distress, holding Xarelto and starting heparin for afib, baseline anti-Xa level is elevated due to Xarelto use, will use aPTT to dose heparin for now until aPTT/HL correlation. Initial aPTT barely therapeutic. Last dose of Xarelto 4/14.  Goal of Therapy:  Heparin level 0.3-0.7 units/ml aPTT 66-102 seconds Monitor platelets by anticoagulation protocol: Yes   Plan:  -Increase heparin to 1400 units/hr -Daily HL, CBC, aPTT -Next aptt with AM labs  Harvel Quale 05/22/2015,3:55 PM

## 2015-05-22 NOTE — Progress Notes (Addendum)
ANTICOAGULATION CONSULT NOTE - Initial Consult  Pharmacy Consult for Heparin (while Xarelto on hold) Indication: atrial fibrillation  Allergies  Allergen Reactions  . Ciprofloxacin Rash    REACTION: hives/rash    Patient Measurements: Height: '5\' 5"'$  (165.1 cm) Weight: 231 lb 11.3 oz (105.1 kg) IBW/kg (Calculated) : 57  Vital Signs: Temp: 98.7 F (37.1 C) (04/15 0400) Temp Source: Axillary (04/15 0400) BP: 129/69 mmHg (04/15 0315) Pulse Rate: 99 (04/15 0315)  Labs:  Recent Labs  05/21/15 2133 05/22/15 0553 05/22/15 0554  HGB 11.7* 9.6*  --   HCT 43.2 36.3  --   PLT 150 141*  --   APTT  --   --  39*  LABPROT  --  15.6*  --   INR  --  1.23  --   HEPARINUNFRC  --  0.54  --   CREATININE 0.51  --   --     Estimated Creatinine Clearance: 88.8 mL/min (by C-G formula based on Cr of 0.51).   Medical History: Past Medical History  Diagnosis Date  . Diabetes mellitus without complication ([place])   . Acute MI ([place])   . Coronary artery disease   . Shortness of breath   . Stroke [facility]) TIA   . Arthritis   . COPD (chronic obstructive pulmonary disease) (HCC)     home O2    Assessment: 60 y/o F from home with resp distress, holding Xarelto and starting heparin for afib, baseline anti-Xa level is elevated due to Xarelto use, will use aPTT to dose heparin for now until aPTT/HL correlation. Last dose of Xarelto > 24 hours ago, ok to start heparin now.   Goal of Therapy:  Heparin level 0.3-0.7 units/ml aPTT 66-102 seconds Monitor platelets by anticoagulation protocol: Yes   Plan:  -Start heparin drip at 1200 units/hr -1500 aPTT -Daily CBC/HL/aPTT -Monitor for bleeding  Narda Bonds 05/22/2015,6:31 AM

## 2015-05-23 ENCOUNTER — Inpatient Hospital Stay (HOSPITAL_COMMUNITY): Payer: 59

## 2015-05-23 LAB — POCT I-STAT 3, ART BLOOD GAS (G3+)
Acid-Base Excess: 18 mmol/L — ABNORMAL HIGH (ref 0.0–2.0)
Acid-Base Excess: 17 mmol/L — ABNORMAL HIGH (ref 0.0–2.0)
Bicarbonate: 42.9 mEq/L — ABNORMAL HIGH (ref 20.0–24.0)
Bicarbonate: 43.1 mEq/L — ABNORMAL HIGH (ref 20.0–24.0)
O2 SAT: 89 %
O2 SAT: 94 %
PCO2 ART: 50.1 mmHg — AB (ref 35.0–45.0)
PCO2 ART: 59.1 mmHg — AB (ref 35.0–45.0)
PH ART: 7.472 — AB (ref 7.350–7.450)
PO2 ART: 68 mmHg — AB (ref 80.0–100.0)
Patient temperature: 99.1
Patient temperature: 99.1
TCO2: 44 mmol/L (ref 0–100)
TCO2: 45 mmol/L (ref 0–100)
pH, Arterial: 7.542 — ABNORMAL HIGH (ref 7.350–7.450)
pO2, Arterial: 51 mmHg — ABNORMAL LOW (ref 80.0–100.0)

## 2015-05-23 LAB — CBC
HEMATOCRIT: 35.6 % — AB (ref 36.0–46.0)
Hemoglobin: 10 g/dL — ABNORMAL LOW (ref 12.0–15.0)
MCH: 27.8 pg (ref 26.0–34.0)
MCHC: 28.1 g/dL — AB (ref 30.0–36.0)
MCV: 98.9 fL (ref 78.0–100.0)
Platelets: 171 10*3/uL (ref 150–400)
RBC: 3.6 MIL/uL — ABNORMAL LOW (ref 3.87–5.11)
RDW: 13.9 % (ref 11.5–15.5)
WBC: 10.5 10*3/uL (ref 4.0–10.5)

## 2015-05-23 LAB — BASIC METABOLIC PANEL
Anion gap: 10 (ref 5–15)
BUN: 17 mg/dL (ref 6–20)
CALCIUM: 8 mg/dL — AB (ref 8.9–10.3)
CO2: 34 mmol/L — AB (ref 22–32)
Chloride: 97 mmol/L — ABNORMAL LOW (ref 101–111)
Creatinine, Ser: 0.49 mg/dL (ref 0.44–1.00)
GFR calc Af Amer: >60 mL/min (ref >60)
GLUCOSE: 245 mg/dL — AB (ref 65–99)
Potassium: 4.2 mmol/L (ref 3.5–5.1)
Sodium: 141 mmol/L (ref 135–145)

## 2015-05-23 LAB — GLUCOSE, CAPILLARY
GLUCOSE-CAPILLARY: 226 mg/dL — AB (ref 65–99)
GLUCOSE-CAPILLARY: 196 mg/dL — AB (ref 65–99)
GLUCOSE-CAPILLARY: 220 mg/dL — AB (ref 65–99)
Glucose-Capillary: 244 mg/dL — ABNORMAL HIGH (ref 65–99)
Glucose-Capillary: 221 mg/dL — ABNORMAL HIGH (ref 65–99)

## 2015-05-23 LAB — APTT: APTT: 74 s — AB (ref 24–37)

## 2015-05-23 LAB — URINE CULTURE: Culture: NO GROWTH

## 2015-05-23 LAB — HEPARIN LEVEL (UNFRACTIONATED): HEPARIN UNFRACTIONATED: 0.53 [IU]/mL (ref 0.30–0.70)

## 2015-05-23 MED ORDER — METHYLPREDNISOLONE SODIUM SUCC 40 MG IJ SOLR
20.0000 mg | Freq: Two times a day (BID) | INTRAMUSCULAR | Status: DC
  Administered 2015-05-23 – 2015-05-25 (×4): 20 mg via INTRAVENOUS
  Filled 2015-05-23 (×3): qty 1

## 2015-05-23 NOTE — Progress Notes (Signed)
Utilization review completed.  

## 2015-05-23 NOTE — Progress Notes (Signed)
ANTICOAGULATION CONSULT NOTE - Initial Consult  Pharmacy Consult for Heparin  Indication: atrial fibrillation  Allergies  Allergen Reactions  . Ciprofloxacin Rash    REACTION: hives/rash    Patient Measurements: Height: '5\' 5"'$  (165.1 cm) Weight: 231 lb 7.7 oz (105 kg) IBW/kg (Calculated) : 57  Vital Signs: Temp: 99 F (37.2 C) (04/16 1202) Temp Source: Axillary (04/16 1202) BP: 101/52 mmHg (04/16 1200) Pulse Rate: 76 (04/16 1200)  Labs:  Recent Labs  05/21/15 2133 05/22/15 0552 05/22/15 0553 05/22/15 0554 05/22/15 1506 05/23/15 0323  HGB 11.7*  --  9.6*  --   --  10.0*  HCT 43.2  --  36.3  --   --  35.6*  PLT 150  --  141*  --   --  171  APTT  --   --   --  39* 66* 74*  LABPROT  --   --  15.6*  --   --   --   INR  --   --  1.23  --   --   --   HEPARINUNFRC  --   --  0.54  --   --  0.53  CREATININE 0.51  --  0.64  --   --  0.49  TROPONINI  --  <0.03  --   --   --   --     Estimated Creatinine Clearance: 88.8 mL/min (by C-G formula based on Cr of 0.49).   Medical History: Past Medical History  Diagnosis Date  . Diabetes mellitus without complication ([place])   . Acute MI ([place])   . Coronary artery disease   . Shortness of breath   . Stroke [facility]) TIA   . Arthritis   . COPD (chronic obstructive pulmonary disease) ([facility])     home O2    Assessment: 60 y/o F from home with resp distress, holding Xarelto and starting heparin for afib. HL therapeutic, CBC stable.  Goal of Therapy:  Heparin level 0.3-0.7 units/ml aPTT 66-102 seconds Monitor platelets by anticoagulation protocol: Yes   Plan:  -Continue heparin at 1400 units/hr -Daily HL, CBC   [name], Jake Church 05/23/2015,1:41 PM

## 2015-05-23 NOTE — Progress Notes (Signed)
E-Link called with Critical ABG results on PC02, awaiting orders, RT to adjust per protocol.

## 2015-05-23 NOTE — Progress Notes (Signed)
PULMONARY / CRITICAL CARE MEDICINE   Name: [name] MRN: [id number] DOB: 1955/08/21    ADMISSION DATE:  05/21/2015  REFERRING MD:  ER  CHIEF COMPLAINT:  Short of breath  SUBJECTIVE:  Tolerating some pressure support.  VITAL SIGNS: BP 113/82 mmHg  Pulse 82  Temp(Src) 99 F (37.2 C) (Axillary)  Resp 16  Ht '5\' 5"'$  (1.651 m)  Wt 231 lb 7.7 oz (105 kg)  BMI 38.52 kg/m2  SpO2 92%  VENTILATOR SETTINGS: Vent Mode:  [-] PRVC FiO2 (%):  [35 %-60 %] 35 % Set Rate:  [18 bmp] 18 bmp Vt Set:  [440 mL-460 mL] 440 mL PEEP:  [5 cmH20] 5 cmH20 Plateau Pressure:  [16 cmH20-24 cmH20] 24 cmH20  INTAKE / OUTPUT: I/O last 3 completed shifts: In: 5122.3 [I.V.:4272.3; NG/GT:299.9; IV Piggyback:550] Out: 1955 [Urine:1905; Emesis/NG output:50]  PHYSICAL EXAMINATION: General: on vent Neuro: more alert, RASS 0, follows commands, moves extremities HEENT:  ETT in place Cardiovascular:  Regular, no murmur Lungs: better air movement, no wheeze Abdomen:  Soft, non tender Musculoskeletal:  No edema Skin:  Chronic redness around neck and chest  LABS:  BMET  Recent Labs Lab 05/21/15 2133 05/22/15 0553 05/23/15 0323  NA 144 144 141  K 3.9 4.1 4.2  CL 92* 98* 97*  CO2 46* 36* 34*  BUN '8 7 17  '$ CREATININE 0.51 0.64 0.49  GLUCOSE 90 246* 245*    Electrolytes  Recent Labs Lab 05/21/15 2133 05/22/15 0553 05/23/15 0323  CALCIUM 8.9 7.8* 8.0*  MG 1.7  --   --     CBC  Recent Labs Lab 05/21/15 2133 05/22/15 0553 05/23/15 0323  WBC 13.7* 11.3* 10.5  HGB 11.7* 9.6* 10.0*  HCT 43.2 36.3 35.6*  PLT 150 141* 171    Coag's  Recent Labs Lab 05/22/15 0553 05/22/15 0554 05/22/15 1506 05/23/15 0323  APTT  --  39* 66* 74*  INR 1.23  --   --   --     Sepsis Markers  Recent Labs Lab 05/21/15 2153 05/22/15 0059  LATICACIDVEN 0.94 1.55    ABG  Recent Labs Lab 05/21/15 2336 05/23/15 0447 05/23/15 0535  PHART 7.306* 7.542* 7.472*  PCO2ART 88.4* 50.1* 59.1*   PO2ART 221.0* 51.0* 68.0*    Liver Enzymes  Recent Labs Lab 05/21/15 2133  AST 12*  ALT 13*  ALKPHOS 65  BILITOT 0.9  ALBUMIN 2.7*    Cardiac Enzymes  Recent Labs Lab 05/22/15 0552  TROPONINI <0.03    Glucose  Recent Labs Lab 05/22/15 1141 05/22/15 1548 05/22/15 1959 05/22/15 2330 05/23/15 0404 05/23/15 0725  GLUCAP 242* 237* 278* 198* 244* 226*    Imaging No results found.   STUDIES:   CULTURES: 4/14 Blood >> 4/15 Urine >> 4/15 Sputum >> 4/15 Pneumococcal Ag >> negative 4/15 Legionella Ag >>  ANTIBIOTICS: 4/15 Rocephin >> 4/15 Zithromax >>   SIGNIFICANT EVENTS: 4/14 Admit  LINES/TUBES: 4/14 ETT >>   DISCUSSION: 60 yo female former smoker with altered mental status and dyspnea from COPD exacerbation and CAP.  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 P:   Pressure support wean as tolerated >> likely will be ready for extubation in next 24 to 48 hrs Change solumedrol to 20 mg q12h on 4/16 Brovana, pulmicort, and prn duoneb  CARDIOVASCULAR A:  Hx of CAD, HTN, HLD. P:  Continue lipitor, lopressor PRN hydralazine, labetalol to keep SBP < 170 Hold outpt imdur for now   RENAL A:   No acute issues. P:   Monitor renal fx, urine outpt  GASTROINTESTINAL A:   Nutrition. P:   Tube feeds while on vent Protonix for SUP  HEMATOLOGIC A:   Anemia of critical illness. P:  F/u CBC  INFECTIOUS A:   CAP. P:   Day 3 of Abx, currently on rocephin/zithromax  ENDOCRINE A:   DM with steroid induced hyperglycemia.   P:   SSI Hold outpt tresiba  NEUROLOGIC A:   Acute metabolic encephalopathy. Hx of CVA >> husband reports she has been on anticoagulation for this. P:   RASS goal: -1 Heparin gtt per pharmacy in place of [place] for now  Updated pt's husband at bedside.  CC time 32  minutes.  Chesley Mires, MD [facility] Pulmonary/Critical Care 05/23/2015, 9:58 AM Pager:  [phone] After 3pm call: [phone]

## 2015-05-24 ENCOUNTER — Inpatient Hospital Stay (HOSPITAL_COMMUNITY): Payer: 59

## 2015-05-24 DIAGNOSIS — J9622 Acute and chronic respiratory failure with hypercapnia: Secondary | ICD-10-CM

## 2015-05-24 DIAGNOSIS — J9621 Acute and chronic respiratory failure with hypoxia: Secondary | ICD-10-CM | POA: Insufficient documentation

## 2015-05-24 LAB — CBC
HCT: 38.3 % (ref 36.0–46.0)
Hemoglobin: 10.7 g/dL — ABNORMAL LOW (ref 12.0–15.0)
MCH: 27.9 pg (ref 26.0–34.0)
MCHC: 27.9 g/dL — ABNORMAL LOW (ref 30.0–36.0)
MCV: 99.7 fL (ref 78.0–100.0)
PLATELETS: 177 10*3/uL (ref 150–400)
RBC: 3.84 MIL/uL — AB (ref 3.87–5.11)
RDW: 14 % (ref 11.5–15.5)
WBC: 9.3 10*3/uL (ref 4.0–10.5)

## 2015-05-24 LAB — GLUCOSE, CAPILLARY
GLUCOSE-CAPILLARY: 215 mg/dL — AB (ref 65–99)
GLUCOSE-CAPILLARY: 167 mg/dL — AB (ref 65–99)
GLUCOSE-CAPILLARY: 160 mg/dL — AB (ref 65–99)
GLUCOSE-CAPILLARY: 155 mg/dL — AB (ref 65–99)
Glucose-Capillary: 180 mg/dL — ABNORMAL HIGH (ref 65–99)
Glucose-Capillary: 181 mg/dL — ABNORMAL HIGH (ref 65–99)
Glucose-Capillary: 203 mg/dL — ABNORMAL HIGH (ref 65–99)

## 2015-05-24 LAB — BASIC METABOLIC PANEL
ANION GAP: 10 (ref 5–15)
BUN: 18 mg/dL (ref 6–20)
CALCIUM: 8.5 mg/dL — AB (ref 8.9–10.3)
CO2: 36 mmol/L — ABNORMAL HIGH (ref 22–32)
Chloride: 98 mmol/L — ABNORMAL LOW (ref 101–111)
Creatinine, Ser: 0.47 mg/dL (ref 0.44–1.00)
GFR calc Af Amer: >60 mL/min (ref >60)
GLUCOSE: 212 mg/dL — AB (ref 65–99)
POTASSIUM: 4.2 mmol/L (ref 3.5–5.1)
SODIUM: 144 mmol/L (ref 135–145)

## 2015-05-24 LAB — TRIGLYCERIDES: TRIGLYCERIDES: 152 mg/dL — AB (ref <150)

## 2015-05-24 LAB — LEGIONELLA PNEUMOPHILA SEROGP 1 UR AG: L. PNEUMOPHILA SEROGP 1 UR AG: NEGATIVE

## 2015-05-24 LAB — HEPARIN LEVEL (UNFRACTIONATED): Heparin Unfractionated: 0.34 IU/mL (ref 0.30–0.70)

## 2015-05-24 MED ORDER — FENTANYL CITRATE (PF) 100 MCG/2ML IJ SOLN
25.0000 ug | INTRAMUSCULAR | Status: DC | PRN
  Administered 2015-05-24 (×2): 100 ug via INTRAVENOUS
  Filled 2015-05-24: qty 2

## 2015-05-24 MED ORDER — DEXMEDETOMIDINE HCL IN NACL 200 MCG/50ML IV SOLN
0.4000 ug/kg/h | INTRAVENOUS | Status: DC
  Administered 2015-05-24: 0.4 ug/kg/h via INTRAVENOUS
  Filled 2015-05-24: qty 50

## 2015-05-24 MED ORDER — IPRATROPIUM-ALBUTEROL 0.5-2.5 (3) MG/3ML IN SOLN
3.0000 mL | RESPIRATORY_TRACT | Status: DC
  Administered 2015-05-25 – 2015-05-27 (×12): 3 mL via RESPIRATORY_TRACT
  Filled 2015-05-24 (×12): qty 3

## 2015-05-24 MED ORDER — ACETAMINOPHEN 325 MG PO TABS
650.0000 mg | ORAL_TABLET | Freq: Four times a day (QID) | ORAL | Status: DC | PRN
  Administered 2015-05-24 – 2015-05-25 (×2): 650 mg via ORAL
  Filled 2015-05-24 (×2): qty 2

## 2015-05-24 NOTE — Procedures (Signed)
Extubation Procedure Note  Patient Details:   Name: [name] DOB: 07-01-1955 MRN: [id number]   Airway Documentation:     Evaluation  O2 sats: transiently fell during during procedure and currently acceptable Complications: No apparent complications Patient did tolerate procedure well. Bilateral Breath Sounds: Rhonchi, Diminished   Yes   Pt extubated to her 5L [place] (home setting), however, spo2 decreased to 74%. Pt placed on 10L HFNC with improvement to 98%, thus reducing it to 8L [place]. RN aware and will also titrate as tolerated. Pt able to speak and has a weak non-productive cough. Pt with diminished BS throughout. Pt's husband states she is a mouth breather so she may need a VM at night. Pt is adamant no Bipap, but is ok with re-intubation if necessary. RT will continue to monitor.   Jesse Sans 05/24/2015, 3:46 PM

## 2015-05-24 NOTE — Progress Notes (Signed)
PULMONARY / CRITICAL CARE MEDICINE   Name: [name] MRN: [id number] DOB: September 26, 1955    ADMISSION DATE:  05/21/2015  REFERRING MD:  ER  CHIEF COMPLAINT:  Short of breath   STUDIES:   CULTURES: 4/14 Blood >> 4/15 Urine >> 4/15 Sputum >> 4/15 Pneumococcal Ag >> negative 4/15 Legionella Ag >>  ANTIBIOTICS: 4/15 Rocephin >> 4/15 Zithromax >>   LINES/TUBES: 4/14 ETT >>    SIGNIFICANT EVENTS: 4/14 Admit 4/16 - Tolerating some pressure support.   SUBJECTIVE/OVERNIGHT/INTERVAL HX 4/17  -  On SB T with low dose diprivan. Feels good for extubation. Wants reintubation if extubation fails. REfuses BIPAP post extubation. Husband at bedside.   VITAL SIGNS: BP 111/66 mmHg  Pulse 88  Temp(Src) 98.3 F (36.8 C) (Axillary)  Resp 11  Ht '5\' 5"'$  (1.651 m)  Wt 235 lb 0.2 oz (106.6 kg)  BMI 39.11 kg/m2  SpO2 93%  VENTILATOR SETTINGS: Vent Mode:  [-] PRVC FiO2 (%):  [40 %] 40 % Set Rate:  [14 bmp] 14 bmp Vt Set:  [440 mL] 440 mL PEEP:  [5 cmH20] 5 cmH20 Pressure Support:  [5 cmH20-8 cmH20] 8 cmH20 Plateau Pressure:  [19 cmH20-30 cmH20] 30 cmH20  INTAKE / OUTPUT: I/O last 3 completed shifts: In: 2725.9 [I.V.:1335.9; NG/GT:540; IV Piggyback:850] Out: 4158 [Urine:3175]  PHYSICAL EXAMINATION: General: on vent Neuro: more alert, RASS 0, follows commands, moves extremities HEENT:  ETT in place Cardiovascular:  Regular, no murmur Lungs: better air movement, no wheeze but has barrel chest Abdomen:  Soft, non tender Musculoskeletal:  No edema Skin:  Chronic redness around neck and chest  LABS:  PULMONARY  Recent Labs Lab 05/21/15 2155 05/21/15 2336 05/23/15 0447 05/23/15 0535  PHART 7.155* 7.306* 7.542* 7.472*  PCO2ART VALUE ABOVE REPORTABLE RANGE. 88.4* 50.1* 59.1*  PO2ART 65* 221.0* 51.0* 68.0*  HCO3 51.3* 44.2* 42.9* 43.1*  TCO2 55.9 47 44 45  O2SAT 89.7 100.0 89.0 94.0    CBC  Recent Labs Lab 05/22/15 0553 05/23/15 0323 05/24/15 0319  HGB 9.6*  10.0* 10.7*  HCT 36.3 35.6* 38.3  WBC 11.3* 10.5 9.3  PLT 141* 171 177    COAGULATION  Recent Labs Lab 05/22/15 0553  INR 1.23    CARDIAC   Recent Labs Lab 05/22/15 0552  TROPONINI <0.03   No results for input(s): PROBNP in the last 168 hours.   CHEMISTRY  Recent Labs Lab 05/21/15 2133 05/22/15 0553 05/23/15 0323 05/24/15 0319  NA 144 144 141 144  K 3.9 4.1 4.2 4.2  CL 92* 98* 97* 98*  CO2 46* 36* 34* 36*  GLUCOSE 90 246* 245* 212*  BUN '8 7 17 18  '$ CREATININE 0.51 0.64 0.49 0.47  CALCIUM 8.9 7.8* 8.0* 8.5*  MG 1.7  --   --   --    Estimated Creatinine Clearance: 89.5 mL/min (by C-G formula based on Cr of 0.47).   LIVER  Recent Labs Lab 05/21/15 2133 05/22/15 0553  AST 12*  --   ALT 13*  --   ALKPHOS 65  --   BILITOT 0.9  --   PROT 6.9  --   ALBUMIN 2.7*  --   INR  --  1.23     INFECTIOUS  Recent Labs Lab 05/21/15 2153 05/22/15 0059  LATICACIDVEN 0.94 1.55     ENDOCRINE CBG (last 3)   Recent Labs  05/23/15 2343 05/24/15 0343 05/24/15 0804  GLUCAP 181* 203* 215*         IMAGING x48h  -  image(s) personally visualized  -   highlighted in bold Dg Chest Port 1 View  05/24/2015  CLINICAL DATA:  60 year old female respiratory failure. Subsequent encounter. EXAM: PORTABLE CHEST 1 VIEW COMPARISON:  05/23/2015. FINDINGS: Endotracheal tube tip 2.8 cm above the carina. Nasogastric tube courses below the diaphragm. Tip is not included on the present exam. Cardiomegaly. Consolidation lung bases may represent atelectasis and/or infiltrate. This has progressed since prior exam and may be partially explained by technique. Small pleural effusions not excluded. Mild central pulmonary vascular prominence. Calcified aorta. No gross pneumothorax. IMPRESSION: Consolidation lung bases may represent atelectasis and/ or infiltrates. This has progressed since prior exam and may be partially explained by technique. Cardiomegaly. Calcified aorta.  Electronically Signed   By: Genia Del [name]   On: 05/24/2015 07:56   Dg Chest Port 1 View  05/23/2015  CLINICAL DATA:  60 year old female with a history of respiratory failure and acute myocardial infarction. EXAM: PORTABLE CHEST 1 VIEW COMPARISON:  05/22/2015, 05/21/2015 FINDINGS: Cardiomediastinal silhouette unchanged. Atherosclerotic calcifications of the aortic arch. Surgical changes of median sternotomy. Endotracheal tube terminates suitably above the carina, 3.5 cm. Overlying EKG leads. Gastric tube projects over the mediastinum, terminating out of the field of view. Interlobular septal thickening with mixed interstitial and airspace opacities, similar prior. Improved visualization of the right hemidiaphragm. Retrocardiac region opacified. Blunting of the bilateral costophrenic angles. IMPRESSION: Mild improved aeration at the right base, with improved interlobular septal thickening/ interstitial disease, compatible with improving edema. Superimposed infection difficult to exclude. Likely bilateral small pleural effusions. Unchanged support apparatus. Signed, Dulcy Fanny. Earleen Newport, DO [facility] Radiology Electronically Signed   By: Corrie Mckusick D.O.   On: 05/23/2015 10:12      DISCUSSION: 60 yo female former smoker with altered mental status and dyspnea from COPD exacerbation and CAP. 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   - will meet extubation criteria if performance on SBT same off diprivan P:   Dc diprivan -> Pressure support wean as tolerated -> extubate if well (might need precedex) Change solumedrol to 20 mg q12h on 4/16 Brovana, pulmicort, and prn duoneb NO BIPAP EVER - her personal choice due to claustrophobia REIINTUBATE if needed  CARDIOVASCULAR A:  Hx of CAD, HTN, HLD. P:  Continue lipitor,  lopressor PRN hydralazine, labetalol to keep SBP < 170 Hold outpt imdur for now   RENAL A:   No acute issues. P:   Monitor renal fx, urine outpt  GASTROINTESTINAL A:   Nutrition. P:   Tube feeds while on vent Protonix for SUP  HEMATOLOGIC A:   Anemia of critical illness. P:  F/u CBC  INFECTIOUS A:   CAP. P:   Day 4 of Abx, currently on rocephin/zithromax  ENDOCRINE A:   DM with steroid induced hyperglycemia.   P:   SSI Hold outpt tresiba  NEUROLOGIC A:   Acute metabolic encephalopathy. Hx of CVA >> husband reports she has been on anticoagulation for this.   - normal mental status but mild anxiety +  P:   RASS goal: -1 Heparin gtt per pharmacy in place of xarelto for now Precedex gtt if needed - ordered   FAMILY 05/23/15 -and 05/1715  Updated pt's husband at bedside.   GLOBAL Extubate if doing well off diprivan. UIse precedex if needed. She refuses bipap   The patient  is critically ill with multiple organ systems failure and requires high complexity decision making for assessment and support, frequent evaluation and titration of therapies, application of advanced monitoring technologies and extensive interpretation of multiple databases.   Critical Care Time devoted to patient care services described in this note is  30  Minutes. This time reflects time of care of this signee Dr Brand Males. This critical care time does not reflect procedure time, or teaching time or supervisory time of PA/NP/Med student/Med Resident etc but could involve care discussion time    Dr. Brand Males, [name], [facility].C.P Pulmonary and Critical Care Medicine Staff Physician [place] [facility] Pager: [phone], If no answer or between  15:00h - 7:00h: call [phone]  05/24/2015 10:01 AM

## 2015-05-24 NOTE — Progress Notes (Signed)
[facility] Progress Note Patient Name: Etrulia Zarr DOB: 1955/07/10 MRN: [id number]   Date of Service  05/24/2015  HPI/Events of Note  Patient c/o headache.   eICU Interventions  Will order Tylenol PO Q 6 hours PRN.     Intervention Category Intermediate Interventions: Pain - evaluation and management  Mylik Pro Cornelia Copa 05/24/2015, 9:16 PM

## 2015-05-24 NOTE — Progress Notes (Signed)
Inpatient Diabetes Program Recommendations  AACE/ADA: New Consensus Statement on Inpatient Glycemic Control (2015)  Target Ranges:  Prepandial:   less than 140 mg/dL      Peak postprandial:   less than 180 mg/dL (1-2 hours)      Critically ill patients:  140 - 180 mg/dL   Results for [name], [name] (MRN [id number]) as of 05/24/2015 10:15  Ref. Range 05/22/2015 23:30 05/23/2015 04:04 05/23/2015 07:25 05/23/2015 11:36 05/23/2015 15:30 05/23/2015 19:55  Glucose-Capillary Latest Ref Range: 65-99 mg/dL 198 (H) 244 (H) 226 (H) 221 (H) 196 (H) 220 (H)   Results for [name], [name] (MRN [id number]) as of 05/24/2015 10:15  Ref. Range 05/23/2015 23:43 05/24/2015 03:43 05/24/2015 08:04  Glucose-Capillary Latest Ref Range: 65-99 mg/dL 181 (H) 203 (H) 215 (H)    Admit with: Resp Failure  History: DM, COPD  Home DM Meds: Tresiba (insulin degludec) 20 units daily       Novolog 20 units tidwc  Current Insulin Orders: Novolog Resistant Correction Scale/ SSI (0-20 units) Q4 hours      -Note patient currently Intubated.  Receiving Vital HP tube feeds at 15 cc/hour.  -Also getting IV Solumedrol 20 mg bid.     MD- Please consider starting Levemir 15 units daily (75% home dose of long-acting insulin)     --Will follow patient during hospitalization--  Wyn Quaker RN, MSN, CDE Diabetes Coordinator Inpatient Glycemic Control Team Team Pager: [phone] (8a-5p)

## 2015-05-24 NOTE — Progress Notes (Signed)
[facility] for Heparin  Indication: atrial fibrillation  Allergies  Allergen Reactions  . Ciprofloxacin Rash    REACTION: hives/rash    Patient Measurements: Height: '5\' 5"'$  (165.1 cm) Weight: 235 lb 0.2 oz (106.6 kg) IBW/kg (Calculated) : 57  Vital Signs: Temp: 98.7 F (37.1 C) (04/17 0400) Temp Source: Axillary (04/17 0400) BP: 97/65 mmHg (04/17 0700) Pulse Rate: 63 (04/17 0700)  Labs:  Recent Labs  05/22/15 0552 05/22/15 0553 05/22/15 0554 05/22/15 1506 05/23/15 0323 05/24/15 0319  HGB  --  9.6*  --   --  10.0* 10.7*  HCT  --  36.3  --   --  35.6* 38.3  PLT  --  141*  --   --  171 177  APTT  --   --  39* 66* 74*  --   LABPROT  --  15.6*  --   --   --   --   INR  --  1.23  --   --   --   --   HEPARINUNFRC  --  0.54  --   --  0.53 0.34  CREATININE  --  0.64  --   --  0.49 0.47  TROPONINI <0.03  --   --   --   --   --     Estimated Creatinine Clearance: 89.5 mL/min (by C-G formula based on Cr of 0.47).  Assessment: 60 y/o F from home with resp distress, holding Xarelto and starting heparin for afib. Heparin level remains therapeutic. No bleeding noted.   Goal of Therapy:  Heparin level 0.3-0.7 units/ml aPTT 66-102 seconds Monitor platelets by anticoagulation protocol: Yes   Plan:  -Continue heparin at 1400 units/hr -Daily HL, CBC  Jessi Pitstick, Rande Lawman 05/24/2015,8:33 AM

## 2015-05-25 ENCOUNTER — Inpatient Hospital Stay (HOSPITAL_COMMUNITY): Payer: 59

## 2015-05-25 DIAGNOSIS — J441 Chronic obstructive pulmonary disease with (acute) exacerbation: Secondary | ICD-10-CM

## 2015-05-25 LAB — GLUCOSE, CAPILLARY
Glucose-Capillary: 128 mg/dL — ABNORMAL HIGH (ref 65–99)
Glucose-Capillary: 172 mg/dL — ABNORMAL HIGH (ref 65–99)
Glucose-Capillary: 156 mg/dL — ABNORMAL HIGH (ref 65–99)
Glucose-Capillary: 115 mg/dL — ABNORMAL HIGH (ref 65–99)
Glucose-Capillary: 138 mg/dL — ABNORMAL HIGH (ref 65–99)

## 2015-05-25 LAB — CBC
HCT: 39 % (ref 36.0–46.0)
Hemoglobin: 11.1 g/dL — ABNORMAL LOW (ref 12.0–15.0)
MCH: 28.2 pg (ref 26.0–34.0)
MCHC: 28.5 g/dL — AB (ref 30.0–36.0)
MCV: 99 fL (ref 78.0–100.0)
Platelets: 178 10*3/uL (ref 150–400)
RBC: 3.94 MIL/uL (ref 3.87–5.11)
RDW: 14 % (ref 11.5–15.5)
WBC: 9.2 10*3/uL (ref 4.0–10.5)

## 2015-05-25 LAB — CULTURE, RESPIRATORY

## 2015-05-25 LAB — CULTURE, RESPIRATORY W GRAM STAIN: Culture: NO GROWTH

## 2015-05-25 LAB — BASIC METABOLIC PANEL
Anion gap: 8 (ref 5–15)
BUN: 12 mg/dL (ref 6–20)
CALCIUM: 8.6 mg/dL — AB (ref 8.9–10.3)
CHLORIDE: 98 mmol/L — AB (ref 101–111)
CO2: 40 mmol/L — ABNORMAL HIGH (ref 22–32)
CREATININE: 0.55 mg/dL (ref 0.44–1.00)
GFR calc non Af Amer: >60 mL/min (ref >60)
Glucose, Bld: 133 mg/dL — ABNORMAL HIGH (ref 65–99)
Potassium: 3.8 mmol/L (ref 3.5–5.1)
SODIUM: 146 mmol/L — AB (ref 135–145)

## 2015-05-25 LAB — HEPARIN LEVEL (UNFRACTIONATED): HEPARIN UNFRACTIONATED: 0.33 [IU]/mL (ref 0.30–0.70)

## 2015-05-25 LAB — PHOSPHORUS: PHOSPHORUS: 3.4 mg/dL (ref 2.5–4.6)

## 2015-05-25 LAB — MAGNESIUM: MAGNESIUM: 2 mg/dL (ref 1.7–2.4)

## 2015-05-25 MED ORDER — INSULIN ASPART 100 UNIT/ML ~~LOC~~ SOLN
0.0000 [IU] | Freq: Three times a day (TID) | SUBCUTANEOUS | Status: DC
  Administered 2015-05-26: 3 [IU] via SUBCUTANEOUS
  Administered 2015-05-26: 1 [IU] via SUBCUTANEOUS
  Administered 2015-05-26: 5 [IU] via SUBCUTANEOUS
  Administered 2015-05-27: 1 [IU] via SUBCUTANEOUS
  Administered 2015-05-27: 3 [IU] via SUBCUTANEOUS

## 2015-05-25 MED ORDER — PANTOPRAZOLE SODIUM 40 MG PO TBEC
40.0000 mg | DELAYED_RELEASE_TABLET | Freq: Every day | ORAL | Status: DC
  Administered 2015-05-25 – 2015-05-27 (×3): 40 mg via ORAL
  Filled 2015-05-25 (×3): qty 1

## 2015-05-25 MED ORDER — INSULIN ASPART 100 UNIT/ML ~~LOC~~ SOLN: 0.0000 [IU] | Freq: Every day | SUBCUTANEOUS | Status: DC

## 2015-05-25 MED ORDER — RIVAROXABAN 20 MG PO TABS
20.0000 mg | ORAL_TABLET | Freq: Every day | ORAL | Status: DC
  Filled 2015-05-25: qty 1

## 2015-05-25 MED ORDER — RIVAROXABAN 20 MG PO TABS
20.0000 mg | ORAL_TABLET | Freq: Every day | ORAL | Status: DC
  Administered 2015-05-25 – 2015-05-27 (×3): 20 mg via ORAL
  Filled 2015-05-25 (×3): qty 1

## 2015-05-25 MED ORDER — MORPHINE SULFATE (CONCENTRATE) 10 MG/0.5ML PO SOLN
5.0000 mg | ORAL | Status: DC | PRN
  Administered 2015-05-25 – 2015-05-27 (×4): 5 mg via ORAL
  Filled 2015-05-25 (×4): qty 0.5

## 2015-05-25 MED ORDER — PREDNISONE 20 MG PO TABS
40.0000 mg | ORAL_TABLET | Freq: Every day | ORAL | Status: DC
  Administered 2015-05-26 – 2015-05-27 (×2): 40 mg via ORAL
  Filled 2015-05-25 (×2): qty 2

## 2015-05-25 MED ORDER — ENSURE ENLIVE PO LIQD
237.0000 mL | ORAL | Status: DC
  Filled 2015-05-25 (×2): qty 237

## 2015-05-25 MED ORDER — DOXYCYCLINE HYCLATE 100 MG PO TABS
100.0000 mg | ORAL_TABLET | Freq: Two times a day (BID) | ORAL | Status: DC
  Administered 2015-05-25 – 2015-05-27 (×5): 100 mg via ORAL
  Filled 2015-05-25 (×5): qty 1

## 2015-05-25 NOTE — Progress Notes (Addendum)
PULMONARY / CRITICAL CARE MEDICINE   Name: [name] MRN: [id number] DOB: 12/17/1955    ADMISSION DATE:  05/21/2015  REFERRING MD:  ER  CHIEF COMPLAINT:  Short of breath   STUDIES:   CULTURES: 4/14 Blood >> 4/15 Urine >> 4/15 Sputum >> 4/15 Pneumococcal Ag >> negative 4/15 Legionella Ag >>     ANTIBIOTICS: 4/15 Rocephin >> 4/15 Zithromax >>   LINES/TUBES: 4/14 ETT >> 4/17   SIGNIFICANT EVENTS: 4/14 Admit 4/16 - Tolerating some pressure support.  4/17  -  On SB T with low dose diprivan. Feels good for extubation. Wants reintubation if extubation fails. REfuses BIPAP post extubation. Husband at bedside. -> extubated   SUBJECTIVE/OVERNIGHT/INTERVAL HX 05/25/15 - s/p  extubatuion today. Uusing [place] by mouth - 5L [place] 97%. Husband says this is baseline. If she uses [place] by nose at [place] - she gets deliriuous. On 2L [place] via mouth ->pulse ox 93%. She c/o dysuria . Also demanding to go to toilet by self - has agreed to see PT rfirst. Med list has xanax but husband says never on it. Agrees to see pall care for class 4 dyspnea which is baseline. Denies seeing pulmonary esp at [facility] on regular basis or ever  On IV heparin - husband says is on xarelto x 8 years  Due to mini-strokes eval at [place]: BP 138/70 mmHg  Pulse 84  Temp(Src) 97.8 F (36.6 C) (Oral)  Resp 17  Ht '5\' 5"'$  (1.651 m)  Wt 103.4 kg (227 lb 15.3 oz)  BMI 37.93 kg/m2  SpO2 99%  VENTILATOR SETTINGS:  Vent Mode:  [-] CPAP;PSV FiO2 (%):  [40 %] 40 % PEEP:  [5 cmH20] 5 cmH20 Pressure Support:  [8 cmH20] 8 cmH20  INTAKE / OUTPUT: I/O last 3 completed shifts: In: 1740.1 [I.V.:900.1; NG/GT:240; IV [id number]:782] Out: 3111 [Urine:3111]  PHYSICAL EXAMINATION: General: obese, deconditioned, in bed Neuro: Oreitned x 3. Alert. Moves all 4s. In Bed HEENT:  [place] via mouth. Purse lip breathing + Cardiovascular:  Regular, no murmur Lungs: better air movement, no wheeze but has barrel chest. CLASS 4  dyspnea Abdomen:  Soft, non tender Musculoskeletal:  No edema Skin:  Chronic redness around neck and chest  LABS:  PULMONARY  Recent Labs Lab 05/21/15 2155 05/21/15 2336 05/23/15 0447 05/23/15 0535  PHART 7.155* 7.306* 7.542* 7.472*  PCO2ART VALUE ABOVE REPORTABLE RANGE. 88.4* 50.1* 59.1*  PO2ART 65* 221.0* 51.0* 68.0*  HCO3 51.3* 44.2* 42.9* 43.1*  TCO2 55.9 47 44 45  O2SAT 89.7 100.0 89.0 94.0    CBC  Recent Labs Lab 05/23/15 0323 05/24/15 0319 05/25/15 0249  HGB 10.0* 10.7* 11.1*  HCT 35.6* 38.3 39.0  WBC 10.5 9.3 9.2  PLT 171 177 178    COAGULATION  Recent Labs Lab 05/22/15 0553  INR 1.23    CARDIAC    Recent Labs Lab 05/22/15 0552  TROPONINI <0.03   No results for input(s): PROBNP in the last 168 hours.   CHEMISTRY  Recent Labs Lab 05/21/15 2133 05/22/15 0553 05/23/15 0323 05/24/15 0319 05/25/15 0249  NA 144 144 141 144 146*  K 3.9 4.1 4.2 4.2 3.8  CL 92* 98* 97* 98* 98*  CO2 46* 36* 34* 36* 40*  GLUCOSE 90 246* 245* 212* 133*  BUN '8 7 17 18 12  '$ CREATININE 0.51 0.64 0.49 0.47 0.55  CALCIUM 8.9 7.8* 8.0* 8.5* 8.6*  MG 1.7  --   --   --  2.0  PHOS  --   --   --   --  3.4   Estimated Creatinine Clearance: 88.1 mL/min (by C-G formula based on Cr of 0.55).   LIVER  Recent Labs Lab 05/21/15 2133 05/22/15 0553  AST 12*  --   ALT 13*  --   ALKPHOS 65  --   BILITOT 0.9  --   PROT 6.9  --   ALBUMIN 2.7*  --   INR  --  1.23     INFECTIOUS  Recent Labs Lab 05/21/15 2153 05/22/15 0059  LATICACIDVEN 0.94 1.55     ENDOCRINE CBG (last 3)   Recent Labs  05/24/15 1950 05/24/15 2321 05/25/15 0329  GLUCAP 160* 155* 128*         IMAGING x48h  - image(s) personally visualized  -   highlighted in bold Dg Chest Port 1 View  05/25/2015  CLINICAL DATA:  Endotracheal tube, infiltrates, followup EXAM: PORTABLE CHEST 1 VIEW COMPARISON:  Portable exam 0720 hours compared to 05/24/2015 FINDINGS: Endotracheal and  nasogastric tubes no longer identified. Enlargement of cardiac silhouette post median sternotomy. Atherosclerotic calcification aorta. Persistent bibasilar infiltrates. Question underlying emphysematous changes. No pneumothorax. RIGHT costophrenic angle excluded. IMPRESSION: Persistent bibasilar infiltrates. Electronically Signed   By: Lavonia Dana [name]   On: 05/25/2015 08:00   Dg Chest Port 1 View  05/24/2015  CLINICAL DATA:  60 year old female respiratory failure. Subsequent encounter. EXAM: PORTABLE CHEST 1 VIEW COMPARISON:  05/23/2015. FINDINGS: Endotracheal tube tip 2.8 cm above the carina. Nasogastric tube courses below the diaphragm. Tip is not included on the present exam. Cardiomegaly. Consolidation lung bases may represent atelectasis and/or infiltrate. This has progressed since prior exam and may be partially explained by technique. Small pleural effusions not excluded. Mild central pulmonary vascular prominence. Calcified aorta. No gross pneumothorax. IMPRESSION: Consolidation lung bases may represent atelectasis and/ or infiltrates. This has progressed since prior exam and may be partially explained by technique. Cardiomegaly. Calcified aorta. Electronically Signed   By: Genia Del [name]   On: 05/24/2015 07:56      DISCUSSION: 60 yo female former smoker with altered mental status and dyspnea from COPD exacerbation and CAP. 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 S/p extubation 4/17   - improved. Tolerating 2L [place] via mouth . Class 4 dyspnea +  P:   NO BIPAP EVER - her personal choice due to claustrophobia REIINTUBATE if needed Nebs Change solumedrolt o prednisone Change ceft/azithro to doxy and complete total 5-6 days Call palliuative care for dyspnea mgmt - have encouraged them to consider opiouids Check alpha 1 (sister died of copd)  2015/06/08   CARDIOVASCULAR A:  Hx of CAD, HTN, HLD. P:  Continue lipitor, lopressor PRN hydralazine, labetalol to keep SBP < 170 Hold outpt imdur for now - restart 06/08/15  RENAL A:   No acute issues. Other than dysuria 4/18 P:   Monitor renal fx, urine outpt Urine culture 05/25/15  GASTROINTESTINAL A:   Nutrition. P:   Start heart health diet 4/18 Protonix for SUP  HEMATOLOGIC A:   Anemia of critical illness. P:  F/u CBC  INFECTIOUS A:   CAP v AECOPD. More likely latter,. Admit date 05/21/2015 with LOS 3 days   P:   Rocephin/zithromax chagne to doxy for 3 more days on 05/25/15  ENDOCRINE A:   DM with steroid induced hyperglycemia.   P:   SSI Hold outpt tresiba  NEUROLOGIC A:  Acute metabolic encephalopathy. Hx of CVA - mini TIAs >> husband reports she has been on anticoagulation for this. Husband again affirmed this 05/25/15   - normal mental status but mild anxiety + on looking at her but patient/husband deny anxiety.   P:   DC Heparin gtt  -> re-start xarelto (this needs to re-addressed as opd because xarelto is indicated only if there is A Fib in TIA and she will need 30d holter)     FAMILY 05/23/15 -and 05/1715  Updated pt's husband at bedside.. On 05/25/15 - IDT meeting with husband. Updated of all of above. Recommended pall care consult for dyspnea + home pall care eval. They are agreeable  GEN A: deconditioned P Bedrest till PT clears    GLOBAL Move to sDU. Patient does not have [place] primary. Will ask [facility] to be primary from 05/26/15 with PCCM as consult - Dr Sherral Hammers paged    Dr. Brand Males, [name], [facility].C.P Pulmonary and Critical Care Medicine Staff Physician [place] [facility] Pager: [phone], If no answer or between  15:00h - 7:00h: call [phone]  05/25/2015 9:01 AM

## 2015-05-25 NOTE — Progress Notes (Signed)
[facility] Progress Note Patient Name: [name] DOB: [birth year] MRN: [id number]   Date of Service  05/25/2015  HPI/Events of Note  Blood glucose = 156. Patient on Insulin at home and is currently eating a diet and on Solumedrol IV.   eICU Interventions  Will order AC/HS sensitive Novolog SSI.      Intervention Category Major Interventions: Hyperglycemia - active titration of insulin therapy  Rajon Bisig [place] 05/25/2015, 3:30 PM

## 2015-05-25 NOTE — Progress Notes (Signed)
Nutrition Follow-up  DOCUMENTATION CODES:   Obesity unspecified  INTERVENTION:   Ensure Enlive po every evening, each supplement provides 350 kcal and 20 grams of protein  NUTRITION DIAGNOSIS:   Inadequate oral intake related to acute illness as evidenced by meal completion < 50%. Ongoing.   GOAL:   Patient will meet greater than or equal to 90% of their needs Progressing.   MONITOR:   PO intake, Supplement acceptance, Labs  ASSESSMENT:   60 yo female former smoker with altered mental status and dyspnea from COPD exacerbation and CAP. She has hx of DM, CAD s/b CABG, HTN, HLD, CVA, Anxiety  4/17 extubated 4/18 started PO diet  Labs reviewed: sodium elevated 146 CBG's: 128-172 Pt discussed during ICU rounds and with RN.    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  Estimated Nutritional Needs:   Kcal:  1600-1800  Protein:  85-100 grams  Fluid:  > 1.6 L/day  EDUCATION NEEDS:   No education needs identified at this time  [place], [place], [place] Pager [phone] After Hours Pager

## 2015-05-26 DIAGNOSIS — J9620 Acute and chronic respiratory failure, unspecified whether with hypoxia or hypercapnia: Secondary | ICD-10-CM

## 2015-05-26 DIAGNOSIS — J9621 Acute and chronic respiratory failure with hypoxia: Secondary | ICD-10-CM | POA: Insufficient documentation

## 2015-05-26 DIAGNOSIS — I10 Essential (primary) hypertension: Secondary | ICD-10-CM

## 2015-05-26 LAB — MAGNESIUM: MAGNESIUM: 1.9 mg/dL (ref 1.7–2.4)

## 2015-05-26 LAB — CBC
HEMATOCRIT: 39.9 % (ref 36.0–46.0)
HEMOGLOBIN: 11 g/dL — AB (ref 12.0–15.0)
MCH: 27.4 pg (ref 26.0–34.0)
MCHC: 27.6 g/dL — AB (ref 30.0–36.0)
MCV: 99.5 fL (ref 78.0–100.0)
Platelets: 171 10*3/uL (ref 150–400)
RBC: 4.01 MIL/uL (ref 3.87–5.11)
RDW: 14.2 % (ref 11.5–15.5)
WBC: 8.3 10*3/uL (ref 4.0–10.5)

## 2015-05-26 LAB — URINE CULTURE

## 2015-05-26 LAB — BASIC METABOLIC PANEL
ANION GAP: 8 (ref 5–15)
BUN: 8 mg/dL (ref 6–20)
CO2: 38 mmol/L — AB (ref 22–32)
Calcium: 8.7 mg/dL — ABNORMAL LOW (ref 8.9–10.3)
Chloride: 97 mmol/L — ABNORMAL LOW (ref 101–111)
Creatinine, Ser: 0.57 mg/dL (ref 0.44–1.00)
GFR calc non Af Amer: >60 mL/min (ref >60)
Glucose, Bld: 144 mg/dL — ABNORMAL HIGH (ref 65–99)
POTASSIUM: 3.5 mmol/L (ref 3.5–5.1)
Sodium: 143 mmol/L (ref 135–145)

## 2015-05-26 LAB — GLUCOSE, CAPILLARY
GLUCOSE-CAPILLARY: 121 mg/dL — AB (ref 65–99)
GLUCOSE-CAPILLARY: 277 mg/dL — AB (ref 65–99)
Glucose-Capillary: 240 mg/dL — ABNORMAL HIGH (ref 65–99)
Glucose-Capillary: 200 mg/dL — ABNORMAL HIGH (ref 65–99)

## 2015-05-26 LAB — PHOSPHORUS: Phosphorus: 5.1 mg/dL — ABNORMAL HIGH (ref 2.5–4.6)

## 2015-05-26 MED ORDER — POTASSIUM CHLORIDE CRYS ER 20 MEQ PO TBCR
20.0000 meq | EXTENDED_RELEASE_TABLET | Freq: Once | ORAL | Status: AC
  Administered 2015-05-26: 20 meq via ORAL
  Filled 2015-05-26: qty 1

## 2015-05-26 MED ORDER — MAGNESIUM SULFATE 2 GM/50ML IV SOLN: 2.0000 g | Freq: Once | INTRAVENOUS | Status: DC

## 2015-05-26 MED ORDER — POTASSIUM CHLORIDE 10 MEQ/50ML IV SOLN: 10.0000 meq | INTRAVENOUS | Status: DC

## 2015-05-26 MED ORDER — POTASSIUM CHLORIDE 10 MEQ/100ML IV SOLN
10.0000 meq | INTRAVENOUS | Status: DC
  Administered 2015-05-26 (×2): 10 meq via INTRAVENOUS
  Filled 2015-05-26 (×2): qty 100

## 2015-05-26 NOTE — Consult Note (Signed)
Bedside goals of care with patient and spouse.  Ms. Chivers is in bed with obvious dyspnea, and increased work of breathing, 02 is at 5L with spo2 approx 82%.  She is clearly uncomfortable although she is able to participate in the goals conversation.  Discussion palliative philosophy and the most important aspect of symptom control to improve quality of life.  Spouse in the only care giver at home.  He is currently unemployed.  There are several young adults, children and grandchildren living in the home although they are not involved or reliable in any care giving support or capacity.    I believe that Ms. Cenci would be a great candidate for the [facility] program through [facility] hospice and I will make referral at discharge.  Most importantly she needs respiratory symptom management.  She and her husband agreed to begin titrating medication to better control her symptoms.  They DO NOT wish to address a change in code status at this time.  I will continue conversations regarding these issues hopefully begin to plan for future exacerbations in her condition.  Plan: begin Roxanol '5mg'$  SL every 4 hrs PRN for dyspnea.  I discussed with RN staff to watch for increased symptoms and medicate as needed.  Also discussed with patient, we will calculate her 24 hr total tomorrow make adjustments as needed and begin to make a plan for regimen at discharge.  Consult discussed with Dr. Hilma Favors, orders received for Roxanol.  Will follow tomorrow, meeting at 54 with spouse.  Kizzie Fantasia, RN-BC, MSN, [facility] Palliative Care

## 2015-05-26 NOTE — Progress Notes (Addendum)
PULMONARY / CRITICAL CARE MEDICINE   Name: [name] MRN: [id number] DOB: [birth year]    ADMISSION DATE:  05/21/2015  REFERRING MD:  ER  CHIEF COMPLAINT:  Short of breath   STUDIES:   CULTURES: 4/14 Blood >> 4/15 Urine >> 4/15 Sputum >> 4/15 Pneumococcal Ag >> negative 4/15 Legionella Ag >>     ANTIBIOTICS: 4/15 Rocephin >> 4/15 Zithromax >>   LINES/TUBES: 4/14 ETT >> 4/17   SIGNIFICANT EVENTS: 4/14 Admit 4/16 - Tolerating some pressure support.  4/17  -  On SB T with low dose diprivan. Feels good for extubation. Wants reintubation if extubation fails. REfuses BIPAP post extubation. Husband at bedside. -> extubated  05/25/15 - s/p  extubatuion today. Uusing [place] by mouth - 5L [place] 97%. Husband says this is baseline. If she uses [place] by nose at [place] - she gets deliriuous. On 2L [place] via mouth ->pulse ox 93%. She c/o dysuria . Also demanding to go to toilet by self - has agreed to see PT rfirst. Med list has xanax but husband says never on it. Agrees to see pall care for class 4 dyspnea which is baseline. Denies seeing pulmonary esp at [facility] on regular basis or ever  On IV heparin - husband says is on xarelto x 8 years  Due to mini-strokes eval at [facility]    SUBJECTIVE/OVERNIGHT/INTERVAL HX 05/26/15 - c/o burning left arm with KCL infusion. Wants it stopped. No overnight issues per RN. Needing 5L [place] at night and 3L[place] day - via mouth. Stil wants to go to bathroom by self - PT and Pall care eval pending. RN feels ok for med surg tfansfer  VITAL SIGNS: BP 101/57 mmHg  Pulse 76  Temp(Src) 97.5 F (36.4 C) (Oral)  Resp 32  Ht '5\' 5"'$  (1.651 m)  Wt 102.8 kg (226 lb 10.1 oz)  BMI 37.71 kg/m2  SpO2 99%  VENTILATOR SETTINGS:     INTAKE / OUTPUT: I/O last 3 completed shifts: In: 4098 [P.O.:400; I.V.:216; IV Piggyback:400] Out: 700 [Urine:700]  PHYSICAL EXAMINATION: General: obese, deconditioned, in bed. Looks better Neuro: Oreitned x 3. Alert. Moves all 4s. In Bed HEENT:   [place] via mouth. Purse lip breathing + Cardiovascular:  Regular, no murmur Lungs: better air movement, no wheeze but has barrel chest. CLASS 4 dyspnea Abdomen:  Soft, non tender Musculoskeletal:  No edema Skin:  Chronic redness around neck and chest  LABS:  PULMONARY  Recent Labs Lab 05/21/15 2155 05/21/15 2336 05/23/15 0447 05/23/15 0535  PHART 7.155* 7.306* 7.542* 7.472*  PCO2ART VALUE ABOVE REPORTABLE RANGE. 88.4* 50.1* 59.1*  PO2ART 65* 221.0* 51.0* 68.0*  HCO3 51.3* 44.2* 42.9* 43.1*  TCO2 55.9 47 44 45  O2SAT 89.7 100.0 89.0 94.0    CBC  Recent Labs Lab 05/24/15 0319 05/25/15 0249 05/26/15 0410  HGB 10.7* 11.1* 11.0*  HCT 38.3 39.0 39.9  WBC 9.3 9.2 8.3  PLT 177 178 171    COAGULATION  Recent Labs Lab 05/22/15 0553  INR 1.23    CARDIAC    Recent Labs Lab 05/22/15 0552  TROPONINI <0.03   No results for input(s): PROBNP in the last 168 hours.   CHEMISTRY  Recent Labs Lab 05/21/15 2133 05/22/15 0553 05/23/15 0323 05/24/15 0319 05/25/15 0249 05/26/15 0410  NA 144 144 141 144 146* 143  K 3.9 4.1 4.2 4.2 3.8 3.5  CL 92* 98* 97* 98* 98* 97*  CO2 46* 36* 34* 36* 40* 38*  GLUCOSE 90 246* 245* 212* 133*  144*  BUN '8 7 17 18 12 8  '$ CREATININE 0.51 0.64 0.49 0.47 0.55 0.57  CALCIUM 8.9 7.8* 8.0* 8.5* 8.6* 8.7*  MG 1.7  --   --   --  2.0 1.9  PHOS  --   --   --   --  3.4 5.1*   Estimated Creatinine Clearance: 87.8 mL/min (by C-G formula based on Cr of 0.57).   LIVER  Recent Labs Lab 05/21/15 2133 05/22/15 0553  AST 12*  --   ALT 13*  --   ALKPHOS 65  --   BILITOT 0.9  --   PROT 6.9  --   ALBUMIN 2.7*  --   INR  --  1.23     INFECTIOUS  Recent Labs Lab 05/21/15 2153 05/22/15 0059  LATICACIDVEN 0.94 1.55     ENDOCRINE CBG (last 3)   Recent Labs  05/25/15 1648 05/25/15 2129 Jun 03, 2015 0755  GLUCAP 115* 138* 121*         IMAGING x48h  - image(s) personally visualized  -   highlighted in bold Dg Chest Port 1  View  05/25/2015  CLINICAL DATA:  Endotracheal tube, infiltrates, followup EXAM: PORTABLE CHEST 1 VIEW COMPARISON:  Portable exam 0720 hours compared to 05/24/2015 FINDINGS: Endotracheal and nasogastric tubes no longer identified. Enlargement of cardiac silhouette post median sternotomy. Atherosclerotic calcification aorta. Persistent bibasilar infiltrates. Question underlying emphysematous changes. No pneumothorax. RIGHT costophrenic angle excluded. IMPRESSION: Persistent bibasilar infiltrates. Electronically Signed   By: Lavonia Dana [name]   On: 05/25/2015 08:00      DISCUSSION: 60 yo female former smoker with altered mental status and dyspnea from COPD exacerbation and CAP. 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 S/p extubation 4/17   - improved. Tolerating 2L [place] via mouth (though later needing 3L [place] day and5L  night) . Class 4 dyspnea +  P:   NO BIPAP EVER - her personal choice due to claustrophobia REIINTUBATE if needed Nebs  prednisone since 4/18 - stop in 12 days doxy since 4/18 with stop date in place x 3 days Call palliuative care for dyspnea mgmt - have encouraged them to consider opiouids Check alpha 1 (sister died of copd) 03-Jun-2015   CARDIOVASCULAR A:  Hx of CAD, HTN, HLD. P:  Continue lipitor, lopressor PRN hydralazine, labetalol to keep SBP < 170 Hold outpt imdur for now - restart 2015-06-03  RENAL A:   n dysuria 4/18 - resolved Low K mild - repletion IV causing burning 06/03/2022  P:   Monitor renal fx, urine outpt Urine culture 05/25/15 pending chagne kcl to po  GASTROINTESTINAL A:   Nutrition. P:    heart health diet 4/18 Protonix for SUP  HEMATOLOGIC A:   Anemia of critical illness. P:  F/u CBC  INFECTIOUS A:   CAP v AECOPD. More likely latter,. Admit date 05/21/2015 with LOS 4 days   P:   Rocephin/zithromax chagne to  doxy for 3 more days on 05/25/15  ENDOCRINE A:   DM with steroid induced hyperglycemia.   P:   SSI Hold outpt tresiba  NEUROLOGIC A:   Acute metabolic encephalopathy. Hx of CVA - mini TIAs >> husband reports she has been on anticoagulation for this. Husband again affirmed this 05/25/15   - normal mental status but mild anxiety + on looking at her but patient/husband deny anxiety.  P:   05/25/15  re-start xarelto (this needs to re-addressed as opd because xarelto is indicated only if there is A Fib in TIA and she will need 30d holter)     FAMILY 05/23/15 -and 05/1715  Updated pt's husband at bedside.. On 05/25/15 - IDT meeting with husband. Updated of all of above. Recommended pall care consult for dyspnea + home pall care eval. They are agreeable  4/19 0- move t o med-surg  GEN A: deconditioned P Bedrest till PT clears    GLOBAL Move to sDU. Patient does not have [place] primary. Will ask [facility] to be primary from 05/26/15 with PCCM consult prn - will need pccm opd fu but not sure she will make it due to non cmompliance. Probably better off with home pall care    Dr. Brand Males, [name], [facility].C.P Pulmonary and Critical Care Medicine Staff Physician [place] [facility] Critical [facility] Pager: [phone], If no answer or between  15:00h - 7:00h: call [phone]  05/26/2015 8:46 AM

## 2015-05-26 NOTE — Progress Notes (Signed)
[facility] ADULT ICU REPLACEMENT PROTOCOL FOR AM LAB REPLACEMENT ONLY  The patient does not apply for the [facility] Adult ICU Electrolyte Replacment Protocol based on the criteria listed below:     Is urine output >/= 0.5 ml/kg/hr for the last 6 hours? No. Patient's UOP is 0.44m/kg/hr   Abnormal electrolyte(s):K3.5  If a panic level lab has been reported, has the CCM MD in charge been notified? Yes.  .   Physician:  Dr KMortimer FriesMD   CVear Clock4/19/2017 5:54 AM

## 2015-05-26 NOTE — Progress Notes (Signed)
Md turned [place] down to 2lpm, Pt did not tolerate, dropped her Sp02 down to 80%. Increased oxygen flow back to 5lpm. Sp02 increased to 89%. Dr. Chase Caller notified

## 2015-05-26 NOTE — Progress Notes (Signed)
Symptom management follow-up.  Ms. [name] reports that she slept very well last night and did notice an improvement in her work of breathing during the night.  Desired effect of oral roxanol appears to be achieved although she has not had dosing on day shift yet.  She does not appear to have increased WOB at time of this assessment, primary RN does not report any increase in respiratory distress.  Patient has reported a better day today.  Would continue with dosing at '5mg'$  for now and assess for total 24hr use and perhaps transition to scheduled dosing with PRN available.  Will follow-up tomorrow for continued support.  Kizzie Fantasia, RN-BC, MSN, [facility] Palliative Care

## 2015-05-26 NOTE — Evaluation (Signed)
Physical Therapy Evaluation Patient Details Name: [name] MRN: [id number] DOB: [birth year] Today's Date: 05/26/2015   History of Present Illness  60 y.o. female admitted to [facility] on 05/21/15 for respiratory distress requiring intubation 4/14-4/17/17.  Pt dx with acute on chronic hypoxic/hypercapnic respiratory failure secondary to AECOPD and CAP.  Pt with significant PMHx of DM, CVA (TIAs), MI, CAD, COPD, and CABG.  At baseline pt uses 5 L O2 [place] in her mouth.   Clinical Impression  Pt is close to her baseline which is not that mobile.  She transfers to and from her recliner chair to a BSC at home with assist from her husband.  She has limited activity tolerance due to baseline breathing issues.  PT will follow acutely and pt would benefit from HHPT trial to see if she can increase her activity tolerance at home.   PT to follow acutely for deficits listed below.       Follow Up Recommendations Home health PT    Equipment Recommendations  None recommended by PT    Recommendations for Other Services   NA    Precautions / Restrictions Precautions Precautions: Fall;Other (comment) Precaution Comments: monitor O2 sats with mobility      Mobility  Bed Mobility Overal bed mobility: Modified Independent             General bed mobility comments: Pt moved EOB using bed rail and HOB elevated ~40 degrees to get to sitting EOB. Had to take rest break once 1/2 way to sitting due to increased DOE.   Transfers Overall transfer level: Needs assistance Equipment used: 1 person hand held assist Transfers: Sit to/from [facility] Sit to Stand: Min assist Stand pivot transfers: Min assist       General transfer comment: Stood multiple times to assess bottom and to move into recliner chair.  Min hand held assist provided for stability, pt with heavy reliance on hands for balance and support.  After standing < 1 min, pt's legs get very shaky.   Ambulation/Gait              General Gait Details: At baseline, really doesn't walk >10'      Balance Overall balance assessment: Needs assistance Sitting-balance support: Feet supported;Bilateral upper extremity supported Sitting balance-Leahy Scale: Fair     Standing balance support: Bilateral upper extremity supported;Single extremity supported Standing balance-Leahy Scale: Poor                               Pertinent Vitals/Pain Pain Assessment: Faces Faces Pain Scale: Hurts little more Pain Location: bottom, RN aware and assessing for pressure sores Pain Descriptors / Indicators: Burning Pain Intervention(s): Limited activity within patient's tolerance;Monitored during session;Repositioned    Home Living Family/patient expects to be discharged to:: Private residence Living Arrangements: Spouse/significant other Available Help at Discharge: Family;Available 24 hours/day Type of Home: House Home Access: Stairs to enter Entrance Stairs-Rails: Left Entrance Stairs-Number of Steps: 3 Home Layout: Multi-level;Able to live on main level with bedroom/bathroom   Additional Comments: Pt sleeps in her recliner chair and toilets in her BSC.     Prior Function Level of Independence: Needs assistance   Gait / Transfers Assistance Needed: min hand held assist to transfer from her recliner to [facility] at home from husband.  Assist to go into and out of house provided by husband.  Pt reports she would love to do more, but her breathing doesn't let  her.   ADL's / Homemaking Assistance Needed: assist from husband.            Extremity/Trunk Assessment   Upper Extremity Assessment: Generalized weakness           Lower Extremity Assessment: Generalized weakness      Cervical / Trunk Assessment: Normal  Communication   Communication: No difficulties  Cognition Arousal/Alertness: Awake/alert Behavior During Therapy: WFL for tasks assessed/performed Overall Cognitive Status: Within  Functional Limits for tasks assessed                      General Comments General comments (skin integrity, edema, etc.): O2 sats stable on 5 L O2 [place] in mouth          Assessment/Plan    PT Assessment Patient needs continued PT services  PT Diagnosis Difficulty walking;Abnormality of gait;Generalized weakness;Acute pain   PT Problem List Decreased strength;Decreased activity tolerance;Decreased balance;Decreased mobility;Cardiopulmonary status limiting activity;Obesity;Pain  PT Treatment Interventions DME instruction;Gait training;Stair training;Functional mobility training;Therapeutic activities;Therapeutic exercise;Balance training;Neuromuscular re-education;Patient/family education   PT Goals (Current goals can be found in the Care Plan section) Acute Rehab PT Goals Patient Stated Goal: to go home soon PT Goal Formulation: With patient Time For Goal Achievement: 06/09/15 Potential to Achieve Goals: Good    Frequency Min 3X/week    End of Session Equipment Utilized During Treatment: Oxygen Activity Tolerance: Other (comment) (limited by DOE) Patient left: in chair;with call bell/phone within reach;with chair alarm set           Time: 2957-4734 PT Time Calculation (min) (ACUTE ONLY): 26 min   Charges:   PT Evaluation $PT Eval Moderate Complexity: 1 Procedure PT Treatments $Therapeutic Activity: 8-22 mins        Deepti Gunawan B. [place], [place], DPT [phone]   05/26/2015, 5:32 PM

## 2015-05-26 NOTE — Progress Notes (Signed)
PROGRESS NOTE    [name]  NWG:[id number] DOB: 03/15/1955 DOA: 05/21/2015 PCP: No PCP Per Patient   Outpatient Specialists:    Brief Narrative:  60 yo female former smoker with altered mental status and dyspnea from COPD exacerbation and CAP. She has hx of DM, CAD s/b CABG, HTN, HLD, CVA, Anxiety.  Intubated with PCCM and upon extubation, transferred to [facility].  Palliative care meeting on 4/19.   Assessment & Plan:   Active Problems:   Essential hypertension   C O P D WITH ACUTE EXACERBATION   Diabetes mellitus type 2, uncontrolled (HCC)   Atrial fibrillation (HCC)   Acute hypercapnic respiratory failure (HCC)   Acute on chronic respiratory failure with hypoxia and hypercapnia (HCC)   Acute on chronic hypoxic/hypercapnic respiratory failure 2nd to AECOPD and CAP. S/p extubation 4/17 - [place] via mouth (though later needing 3L [place] day and 5L  night) . Class 4 dyspnea + -full code -home with hospice?- palliative care following -doxy x 3 days after IV abx -prednisone taper  HTN -lopressor -restart imdur  DM with steroid induced hyperglycemia.  -SSI  Hx of CVA - mini TIAs  -on xarelto -needs 30 day event monitor for a fib   DVT prophylaxis:  xarelto  Code Status: Full Code   Family Communication: Husband at bedside  Disposition Plan:  Home 1-2 days   Consultants:   PCCM  Procedures:        Subjective: Nasal canula giving her trouble, not staying her her mouth  Objective: Filed Vitals:   05/26/15 0914 05/26/15 0955 05/26/15 1001 05/26/15 1006  BP:      Pulse:      Temp:      TempSrc:      Resp:      Height:      Weight:      SpO2: 86% 94% 72% 83%    Intake/Output Summary (Last 24 hours) at 05/26/15 1152 Last data filed at 05/26/15 0951  Gross per 24 hour  Intake    870 ml  Output    550 ml  Net    320 ml   Filed Weights   05/24/15 0500 05/25/15 0403 05/26/15 0500  Weight: 106.6 kg (235 lb 0.2 oz) 103.4 kg (227 lb 15.3 oz) 102.8  kg (226 lb 10.1 oz)    Examination:  General exam: Appears calm and comfortable- obese Respiratory system: moving some air, no wheezing Cardiovascular system: S1 & S2 heard, RRR. No JVD, murmurs, rubs, gallops or clicks. No pedal edema. Gastrointestinal system: Abdomen is nondistended, soft and nontender. No organomegaly or masses felt. Normal bowel sounds heard. Psychiatry: Judgement and insight appear normal. Mood & affect appropriate.     Data Reviewed: I have personally reviewed following labs and imaging studies  CBC:  Recent Labs Lab 05/21/15 2133 05/22/15 0553 05/23/15 0323 05/24/15 0319 05/25/15 0249 05/26/15 0410  WBC 13.7* 11.3* 10.5 9.3 9.2 8.3  NEUTROABS 12.3* 10.5*  --   --   --   --   HGB 11.7* 9.6* 10.0* 10.7* 11.1* 11.0*  HCT 43.2 36.3 35.6* 38.3 39.0 39.9  MCV 104.1* 102.3* 98.9 99.7 99.0 99.5  PLT 150 141* 171 177 178 578   Basic Metabolic Panel:  Recent Labs Lab 05/21/15 2133 05/22/15 0553 05/23/15 0323 05/24/15 0319 05/25/15 0249 05/26/15 0410  NA 144 144 141 144 146* 143  K 3.9 4.1 4.2 4.2 3.8 3.5  CL 92* 98* 97* 98* 98* 97*  CO2 46* 36* 34*  36* 40* 38*  GLUCOSE 90 246* 245* 212* 133* 144*  BUN '8 7 17 18 12 8  '$ CREATININE 0.51 0.64 0.49 0.47 0.55 0.57  CALCIUM 8.9 7.8* 8.0* 8.5* 8.6* 8.7*  MG 1.7  --   --   --  2.0 1.9  PHOS  --   --   --   --  3.4 5.1*   GFR: Estimated Creatinine Clearance: 87.8 mL/min (by C-G formula based on Cr of 0.57). Liver Function Tests:  Recent Labs Lab 05/21/15 2133  AST 12*  ALT 13*  ALKPHOS 65  BILITOT 0.9  PROT 6.9  ALBUMIN 2.7*    Recent Labs Lab 05/21/15 2133  LIPASE 20   No results for input(s): AMMONIA in the last 168 hours. Coagulation Profile:  Recent Labs Lab 05/22/15 0553  INR 1.23   Cardiac Enzymes:  Recent Labs Lab 05/22/15 0552  TROPONINI <0.03   BNP (last 3 results) No results for input(s): PROBNP in the last 8760 hours. HbA1C: No results for input(s): HGBA1C in the  last 72 hours. CBG:  Recent Labs Lab 05/25/15 0818 05/25/15 1158 05/25/15 1648 05/25/15 2129 05/26/15 0755  GLUCAP 172* 156* 115* 138* 121*   Lipid Profile:  Recent Labs  05/24/15 0319  TRIG 152*   Thyroid Function Tests: No results for input(s): TSH, T4TOTAL, FREET4, T3FREE, THYROIDAB in the last 72 hours. Anemia Panel: No results for input(s): VITAMINB12, FOLATE, FERRITIN, TIBC, IRON, RETICCTPCT in the last 72 hours. Urine analysis:    Component Value Date/Time   COLORURINE YELLOW 05/22/2015 0930   APPEARANCEUR HAZY* 05/22/2015 0930   LABSPEC 1.022 05/22/2015 0930   PHURINE 5.5 05/22/2015 0930   GLUCOSEU 100* 05/22/2015 0930   HGBUR NEGATIVE 05/22/2015 0930   BILIRUBINUR NEGATIVE 05/22/2015 0930   KETONESUR 15* 05/22/2015 0930   PROTEINUR NEGATIVE 05/22/2015 0930   NITRITE NEGATIVE 05/22/2015 0930   LEUKOCYTESUR NEGATIVE 05/22/2015 0930    Recent Results (from the past 240 hour(s))  Blood Culture (routine x 2)     Status: None (Preliminary result)   Collection Time: 05/21/15  9:33 PM  Result Value Ref Range Status   Specimen Description BLOOD RIGHT ARM  Final   Special Requests BOTTLES DRAWN AEROBIC AND ANAEROBIC 5ML  Final   Culture NO GROWTH 3 DAYS  Final   Report Status PENDING  Incomplete  Blood Culture (routine x 2)     Status: None (Preliminary result)   Collection Time: 05/21/15  9:44 PM  Result Value Ref Range Status   Specimen Description BLOOD RIGHT ARM  Final   Special Requests BOTTLES DRAWN AEROBIC AND ANAEROBIC 5ML  Final   Culture NO GROWTH 3 DAYS  Final   Report Status PENDING  Incomplete  Urine culture     Status: None   Collection Time: 05/22/15  1:10 AM  Result Value Ref Range Status   Specimen Description URINE, CATHETERIZED  Final   Special Requests NONE  Final   Culture NO GROWTH 1 DAY  Final   Report Status 05/23/2015 FINAL  Final  MRSA PCR Screening     Status: None   Collection Time: 05/22/15  3:13 AM  Result Value Ref Range  Status   MRSA by PCR NEGATIVE NEGATIVE Final    Comment:        The GeneXpert MRSA Assay (FDA approved for NASAL specimens only), is one component of a comprehensive MRSA colonization surveillance program. It is not intended to diagnose MRSA infection nor to guide or monitor  treatment for MRSA infections.   Culture, respiratory (tracheal aspirate)     Status: None   Collection Time: 05/22/15  4:06 PM  Result Value Ref Range Status   Specimen Description TRACHEAL ASPIRATE  Final   Special Requests NONE  Final   Gram Stain   Final    MODERATE WBC PRESENT,BOTH PMN AND MONONUCLEAR FEW SQUAMOUS EPITHELIAL CELLS PRESENT NO ORGANISMS SEEN Performed at [facility]    Culture   Final    NO GROWTH 2 DAYS Performed at [facility]    Report Status 05/25/2015 FINAL  Final  Culture, Urine     Status: None (Preliminary result)   Collection Time: 05/25/15 10:10 AM  Result Value Ref Range Status   Specimen Description URINE, CLEAN CATCH  Final   Special Requests NONE  Final   Culture TOO YOUNG TO READ  Final   Report Status PENDING  Incomplete      Anti-infectives    Start     Dose/Rate Route Frequency Ordered Stop   05/25/15 1000  doxycycline (VIBRA-TABS) tablet 100 mg     100 mg Oral Every 12 hours 05/25/15 0917 05/28/15 0959   05/22/15 1200  vancomycin (VANCOCIN) 1,250 mg in sodium chloride 0.9 % 250 mL IVPB  Status:  Discontinued     1,250 mg 166.7 mL/hr over 90 Minutes Intravenous Every 12 hours 05/21/15 2248 05/22/15 0259   05/22/15 0700  ceFEPIme (MAXIPIME) 1 g in dextrose 5 % 50 mL IVPB  Status:  Discontinued     1 g 100 mL/hr over 30 Minutes Intravenous Every 8 hours 05/21/15 2248 05/22/15 0259   05/22/15 0600  cefTRIAXone (ROCEPHIN) 2 g in dextrose 5 % 50 mL IVPB  Status:  Discontinued     2 g 100 mL/hr over 30 Minutes Intravenous Every 24 hours 05/22/15 0301 05/25/15 0917   05/22/15 0300  azithromycin (ZITHROMAX) 500 mg in dextrose 5 % 250 mL IVPB   Status:  Discontinued     500 mg 250 mL/hr over 60 Minutes Intravenous Every 24 hours 05/22/15 0259 05/25/15 0917   05/21/15 2200  vancomycin (VANCOCIN) 2,000 mg in sodium chloride 0.9 % 500 mL IVPB     2,000 mg 250 mL/hr over 120 Minutes Intravenous  Once 05/21/15 2149 05/22/15 0125   05/21/15 2200  ceFEPIme (MAXIPIME) 2 g in dextrose 5 % 50 mL IVPB     2 g 100 mL/hr over 30 Minutes Intravenous  Once 05/21/15 2149 05/21/15 2330       Radiology Studies: Dg Chest Port 1 View  05/25/2015  CLINICAL DATA:  Endotracheal tube, infiltrates, followup EXAM: PORTABLE CHEST 1 VIEW COMPARISON:  Portable exam 0720 hours compared to 05/24/2015 FINDINGS: Endotracheal and nasogastric tubes no longer identified. Enlargement of cardiac silhouette post median sternotomy. Atherosclerotic calcification aorta. Persistent bibasilar infiltrates. Question underlying emphysematous changes. No pneumothorax. RIGHT costophrenic angle excluded. IMPRESSION: Persistent bibasilar infiltrates. Electronically Signed   By: Lavonia Dana [name]   On: 05/25/2015 08:00        Scheduled Meds: . arformoterol  15 mcg Nebulization BID  . atorvastatin  20 mg Per Tube q1800  . budesonide  0.5 mg Nebulization BID  . doxycycline  100 mg Oral Q12H  . feeding supplement (ENSURE ENLIVE)  237 mL Oral Q24H  . insulin aspart  0-5 Units Subcutaneous QHS  . insulin aspart  0-9 Units Subcutaneous TID WC  . ipratropium-albuterol  3 mL Nebulization Q4H  . metoprolol tartrate  50 mg  Per Tube BID  . pantoprazole  40 mg Oral Q1200  . predniSONE  40 mg Oral Q breakfast  . rivaroxaban  20 mg Oral Q breakfast  . sodium chloride flush  3 mL Intravenous Q12H   Continuous Infusions:    LOS: 4 days    Time spent: 25 min    [place], DO [facility] Pager [phone]  If 7PM-7AM, please contact night-coverage [email or web address] Password [facility] 05/26/2015, 11:52 AM

## 2015-05-26 NOTE — Care Management Note (Signed)
Case Management Note  Patient Details  Name: [name] MRN: [id number] Date of Birth: 04-Feb-1956  Subjective/Objective:      Patient admitted with respiratory failure. She is from home with her husband and is O2 dependent at home.               Action/Plan: Awaiting PT/OT recs. CM following for discharge disposition.   Expected Discharge Date:                  Expected Discharge Plan:     In-House Referral:     Discharge planning Services     Post Acute Care Choice:    Choice offered to:     DME Arranged:    DME Agency:     HH Arranged:    HH Agency:     Status of Service:  In process, will continue to follow  Medicare Important Message Given:    Date Medicare IM Given:    Medicare IM give by:    Date Additional Medicare IM Given:    Additional Medicare Important Message give by:     If discussed at [place] of Stay Meetings, dates discussed:    Additional Comments:  Pollie Friar, RN 05/26/2015, 3:44 PM

## 2015-05-27 LAB — MAGNESIUM: MAGNESIUM: 1.9 mg/dL (ref 1.7–2.4)

## 2015-05-27 LAB — CULTURE, BLOOD (ROUTINE X 2)
CULTURE: NO GROWTH
Culture: NO GROWTH

## 2015-05-27 LAB — CBC
HCT: 39.6 % (ref 36.0–46.0)
HEMOGLOBIN: 11 g/dL — AB (ref 12.0–15.0)
MCH: 27.6 pg (ref 26.0–34.0)
MCHC: 27.8 g/dL — ABNORMAL LOW (ref 30.0–36.0)
MCV: 99.5 fL (ref 78.0–100.0)
PLATELETS: 167 10*3/uL (ref 150–400)
RBC: 3.98 MIL/uL (ref 3.87–5.11)
RDW: 14.1 % (ref 11.5–15.5)
WBC: 8.3 10*3/uL (ref 4.0–10.5)

## 2015-05-27 LAB — BASIC METABOLIC PANEL
Anion gap: 9 (ref 5–15)
BUN: 9 mg/dL (ref 6–20)
CHLORIDE: 97 mmol/L — AB (ref 101–111)
CO2: 38 mmol/L — ABNORMAL HIGH (ref 22–32)
Calcium: 8.8 mg/dL — ABNORMAL LOW (ref 8.9–10.3)
Creatinine, Ser: 0.55 mg/dL (ref 0.44–1.00)
Glucose, Bld: 146 mg/dL — ABNORMAL HIGH (ref 65–99)
POTASSIUM: 3.7 mmol/L (ref 3.5–5.1)
SODIUM: 144 mmol/L (ref 135–145)

## 2015-05-27 LAB — GLUCOSE, CAPILLARY
Glucose-Capillary: 137 mg/dL — ABNORMAL HIGH (ref 65–99)
Glucose-Capillary: 241 mg/dL — ABNORMAL HIGH (ref 65–99)

## 2015-05-27 LAB — ALPHA-1 ANTITRYPSIN PHENOTYPE: A1 ANTITRYPSIN SER: 205 mg/dL — AB (ref 90–200)

## 2015-05-27 LAB — PHOSPHORUS: PHOSPHORUS: 3.5 mg/dL (ref 2.5–4.6)

## 2015-05-27 MED ORDER — ALBUTEROL SULFATE (2.5 MG/3ML) 0.083% IN NEBU: 2.5000 mg | INHALATION_SOLUTION | RESPIRATORY_TRACT | Status: DC | PRN

## 2015-05-27 MED ORDER — MORPHINE SULFATE (CONCENTRATE) 10 MG/0.5ML PO SOLN: 5.0000 mg | ORAL | Status: DC | PRN

## 2015-05-27 MED ORDER — PREDNISONE 10 MG PO TABS
ORAL_TABLET | ORAL | Status: DC
Start: 2015-05-27 — End: 2015-12-08

## 2015-05-27 MED ORDER — IPRATROPIUM-ALBUTEROL 0.5-2.5 (3) MG/3ML IN SOLN
3.0000 mL | Freq: Four times a day (QID) | RESPIRATORY_TRACT | Status: DC
  Administered 2015-05-27: 3 mL via RESPIRATORY_TRACT
  Filled 2015-05-27: qty 3

## 2015-05-27 MED ORDER — METOPROLOL TARTRATE 50 MG PO TABS: 50.0000 mg | ORAL_TABLET | Freq: Two times a day (BID) | ORAL | Status: DC

## 2015-05-27 MED ORDER — LIDOCAINE 5 % EX PTCH
1.0000 | MEDICATED_PATCH | CUTANEOUS | Status: DC
  Administered 2015-05-27: 1 via TRANSDERMAL
  Filled 2015-05-27: qty 1

## 2015-05-27 MED ORDER — PANTOPRAZOLE SODIUM 40 MG PO TBEC: 40.0000 mg | DELAYED_RELEASE_TABLET | Freq: Every day | ORAL | Status: DC

## 2015-05-27 NOTE — Discharge Summary (Signed)
Physician Discharge Summary  [name] HQI:[id number] DOB: 1955-10-03 DOA: 05/21/2015  PCP: No PCP Per Patient  Admit date: 05/21/2015 Discharge date: 05/27/2015   Recommendations for Outpatient Follow-Up:   Outpatient pulm follow up pallaitive care to follow at home for symptom management of dyspnea 30 day outpatient event monitor for a fib  -patient does not like to go to Dr appointments.  An appointment was made with pulm in [place]   Discharge Diagnosis:   Active Problems:   Essential hypertension   C O P D WITH ACUTE EXACERBATION   Diabetes mellitus type 2, uncontrolled (HCC)   Atrial fibrillation (HCC)   Acute hypercapnic respiratory failure (HCC)   Acute on chronic respiratory failure with hypoxia and hypercapnia (HCC)   Respiratory failure ([place])   Morbid obesity ([place])   Discharge disposition:  Home.  Discharge Condition: Improved.  Diet recommendation: Low sodium, heart healthy.  Carbohydrate-modified.   Wound care: None.   History of Present Illness:   This is a 60 yo woman who came here via EMS From home with complaints of respiratory distress. On way she was given solumedrol, albuterol, atrovent. Family was called to bedside who provided all of the history. Family had reported that she was 'altered for 2 weeks'. She was having COPD exacerbation for 2 weeks. Husband tried to give her leftover prednisone for the past week- 30 mg for 3 days and then 20 mg for 2 days. They do not recall any change in sputum production or purulence.  She uses Proair, combivent, and albuterol nebulizer. She always uses albuterol nebs 3-4 times a day. She is on home 5 L of oxygen and that has not changed. Family also says pt is also having urinary symptoms and was taking cranberry supplement.   Family says she has COPD exacerbations few times a year which really started in 2003. She ends up hospitalized once a year. She has indicated no BiPap as she has profound  claustrophobia. She has lifetime smoking history 1 ppd, but has quit for for 5 years.   Pt was intubated, and she had leukocytosis. ABG shows severe respiratory acidosis, likely chronic. Blood and urine cultures are pending   Hospital Course by Problem:   COPD with chronic respiratory failure: 5 L oxygen since 2008 -She quit smoking in 2011 -She used to see Dr. Humphrey Rolls in [place] and then saw Dr. Joya Gaskins in 2015 and [place] office but has not seen a pulmonologist since -She required brief mechanical ventilation during this hospitalization and has now been placed on Roxanol for symptomatic benefit.  -resume home medications including Combivent when necessary and DuoNeb's for an emergency. She should be on LABA/LAMA combination-but she states multiple side effects with inhalers that she has tried before.  Pulm to address as outpatient.  HTN -lopressor -restart imdur  DM with steroid induced hyperglycemia.  -resume home meds  Hx of CVA - mini TIAs  -on xarelto -needs 30 day event monitor for a fib  Medical Consultants:    PCCM  Palliative cre   Discharge Exam:   Filed Vitals:   05/27/15 0549 05/27/15 0900  BP: 110/59 120/57  Pulse: 81 90  Temp: 97.6 F (36.4 C) 97.9 F (36.6 C)  Resp: 20 20   Filed Vitals:   05/26/15 2231 05/27/15 0151 05/27/15 0549 05/27/15 0900  BP:  102/63 110/59 120/57  Pulse:  89 81 90  Temp:  98 F (36.7 C) 97.6 F (36.4 C) 97.9 F (36.6 C)  TempSrc:  Oral Oral Oral  Resp:  '20 20 20  '$ Height:      Weight:      SpO2: 95% 95% 100% 93%    Gen:  NAD   The results of significant diagnostics from this hospitalization (including imaging, microbiology, ancillary and laboratory) are listed below for reference.     Procedures and Diagnostic Studies:   Dg Chest Port 1 View  Jun 08, 2015  CLINICAL DATA:  Respiratory failure. EXAM: PORTABLE CHEST 1 VIEW COMPARISON:  05/21/2015. FINDINGS: The cardiac silhouette is borderline enlarged, with a  mild decrease in size. Decreased inspiration with increased prominence of the pulmonary vasculature and interstitial markings. Minimal patchy opacity at at the right lung base. Interval dense consolidation in the left lower lobe. Diffuse osteopenia. Endotracheal tube in satisfactory position. Nasogastric tube extending into the stomach. IMPRESSION: 1. Decreased inspiration with crowding of the lung markings. Taking this into account, there has probably been a mild increase in pulmonary vascular congestion with possible interval mild interstitial pulmonary edema superimposed on chronic interstitial lung disease. 2. Minimal patchy opacity at the right lung base and more confluent increased density in the left lower lobe. This could represent pneumonia or patchy atelectasis. Electronically Signed   By: Claudie Revering [name]   On: 2015/06/08 09:22   Dg Chest Port 1 View  05/21/2015  CLINICAL DATA:  Endotracheal tube and nasogastric tube placement. EXAM: PORTABLE CHEST 1 VIEW COMPARISON:  09/03/2013. FINDINGS: Endotracheal tube terminates approximately 6.8 cm above the carina. Nasogastric tube is followed into the stomach. Heart size is accentuated by AP semi upright technique but appears stable. Thoracic aorta is calcified. Right costophrenic angle is not included on the image. There may be mild bibasilar airspace opacification, right greater than left. No definite pleural fluid. IMPRESSION: Question mild bibasilar airspace opacification, right greater than left. Pneumonia cannot be excluded. Electronically Signed   By: Lorin Picket [name]   On: 05/21/2015 23:48     Labs:   Basic Metabolic Panel:  Recent Labs Lab 05/21/15 2133  05/23/15 0323 05/24/15 0319 05/25/15 0249 05/26/15 0410 05/27/15 0519  NA 144  < > 141 144 146* 143 144  K 3.9  < > 4.2 4.2 3.8 3.5 3.7  CL 92*  < > 97* 98* 98* 97* 97*  CO2 46*  < > 34* 36* 40* 38* 38*  GLUCOSE 90  < > 245* 212* 133* 144* 146*  BUN 8  < > '17 18 12 8 9    '$ CREATININE 0.51  < > 0.49 0.47 0.55 0.57 0.55  CALCIUM 8.9  < > 8.0* 8.5* 8.6* 8.7* 8.8*  MG 1.7  --   --   --  2.0 1.9 1.9  PHOS  --   --   --   --  3.4 5.1* 3.5  < > = values in this interval not displayed. GFR Estimated Creatinine Clearance: 87.8 mL/min (by C-G formula based on Cr of 0.55). Liver Function Tests:  Recent Labs Lab 05/21/15 2133  AST 12*  ALT 13*  ALKPHOS 65  BILITOT 0.9  PROT 6.9  ALBUMIN 2.7*    Recent Labs Lab 05/21/15 2133  LIPASE 20   No results for input(s): AMMONIA in the last 168 hours. Coagulation profile  Recent Labs Lab Jun 08, 2015 0553  INR 1.23    CBC:  Recent Labs Lab 05/21/15 2133 08-Jun-2015 0553 05/23/15 0323 05/24/15 0319 05/25/15 0249 05/26/15 0410 05/27/15 0519  WBC 13.7* 11.3* 10.5 9.3 9.2 8.3 8.3  NEUTROABS  12.3* 10.5*  --   --   --   --   --   HGB 11.7* 9.6* 10.0* 10.7* 11.1* 11.0* 11.0*  HCT 43.2 36.3 35.6* 38.3 39.0 39.9 39.6  MCV 104.1* 102.3* 98.9 99.7 99.0 99.5 99.5  PLT 150 141* 171 177 178 171 167   Cardiac Enzymes:  Recent Labs Lab 05/22/15 0552  TROPONINI <0.03   BNP: Invalid input(s): POCBNP CBG:  Recent Labs Lab 05/26/15 1133 05/26/15 1657 05/26/15 2132 05/27/15 0624 05/27/15 1108  GLUCAP 277* 240* 200* 137* 241*   D-Dimer No results for input(s): DDIMER in the last 72 hours. Hgb A1c No results for input(s): HGBA1C in the last 72 hours. Lipid Profile No results for input(s): CHOL, HDL, LDLCALC, TRIG, CHOLHDL, LDLDIRECT in the last 72 hours. Thyroid function studies No results for input(s): TSH, T4TOTAL, T3FREE, THYROIDAB in the last 72 hours.  Invalid input(s): FREET3 Anemia work up No results for input(s): VITAMINB12, FOLATE, FERRITIN, TIBC, IRON, RETICCTPCT in the last 72 hours. Microbiology Recent Results (from the past 240 hour(s))  Blood Culture (routine x 2)     Status: None (Preliminary result)   Collection Time: 05/21/15  9:33 PM  Result Value Ref Range Status   Specimen  Description BLOOD RIGHT ARM  Final   Special Requests BOTTLES DRAWN AEROBIC AND ANAEROBIC 5ML  Final   Culture NO GROWTH 4 DAYS  Final   Report Status PENDING  Incomplete  Blood Culture (routine x 2)     Status: None (Preliminary result)   Collection Time: 05/21/15  9:44 PM  Result Value Ref Range Status   Specimen Description BLOOD RIGHT ARM  Final   Special Requests BOTTLES DRAWN AEROBIC AND ANAEROBIC 5ML  Final   Culture NO GROWTH 4 DAYS  Final   Report Status PENDING  Incomplete  Urine culture     Status: None   Collection Time: 05/22/15  1:10 AM  Result Value Ref Range Status   Specimen Description URINE, CATHETERIZED  Final   Special Requests NONE  Final   Culture NO GROWTH 1 DAY  Final   Report Status 05/23/2015 FINAL  Final  MRSA PCR Screening     Status: None   Collection Time: 05/22/15  3:13 AM  Result Value Ref Range Status   MRSA by PCR NEGATIVE NEGATIVE Final    Comment:        The GeneXpert MRSA Assay (FDA approved for NASAL specimens only), is one component of a comprehensive MRSA colonization surveillance program. It is not intended to diagnose MRSA infection nor to guide or monitor treatment for MRSA infections.   Culture, respiratory (tracheal aspirate)     Status: None   Collection Time: 05/22/15  4:06 PM  Result Value Ref Range Status   Specimen Description TRACHEAL ASPIRATE  Final   Special Requests NONE  Final   Gram Stain   Final    MODERATE WBC PRESENT,BOTH PMN AND MONONUCLEAR FEW SQUAMOUS EPITHELIAL CELLS PRESENT NO ORGANISMS SEEN Performed at [facility]    Culture   Final    NO GROWTH 2 DAYS Performed at [facility]    Report Status 05/25/2015 FINAL  Final  Culture, Urine     Status: None   Collection Time: 05/25/15 10:10 AM  Result Value Ref Range Status   Specimen Description URINE, CLEAN CATCH  Final   Special Requests NONE  Final   Culture MULTIPLE SPECIES PRESENT, SUGGEST RECOLLECTION  Final   Report Status  05/26/2015  FINAL  Final     Discharge Instructions:   Discharge Instructions    Diet - low sodium heart healthy    Complete by:  As directed      Diet Carb Modified    Complete by:  As directed      Discharge instructions    Complete by:  As directed   Outpatient palliative care follow up for symptoms of dyspnea management Resume home O2 5L     Increase activity slowly    Complete by:  As directed             Medication List    TAKE these medications        ALPRAZolam 0.25 MG tablet  Commonly known as:  XANAX  Take 0.25 mg by mouth 2 (two) times daily as needed. anxiety     atorvastatin 20 MG tablet  Commonly known as:  LIPITOR  Take 1 tablet (20 mg total) by mouth daily.     budesonide 0.5 MG/2ML nebulizer solution  Commonly known as:  PULMICORT  Take 0.5 mg by nebulization 2 (two) times daily.     insulin aspart 100 UNIT/ML FlexPen  Commonly known as:  NOVOLOG FLEXPEN  Inject 6 Units into the skin 3 (three) times daily with meals.     Insulin Pen Needle 32G X 4 MM Misc  1 each by Does not apply route as needed.     ipratropium-albuterol 0.5-2.5 (3) MG/3ML Soln  Commonly known as:  DUONEB  Take 3 mLs by nebulization 4 (four) times daily.     Ipratropium-Albuterol 20-100 MCG/ACT Aers respimat  Commonly known as:  COMBIVENT RESPIMAT  Inhale 1-2 puffs into the lungs every 4 (four) hours as needed for wheezing.     isosorbide mononitrate 60 MG 24 hr tablet  Commonly known as:  IMDUR  Take 1 tablet (60 mg total) by mouth daily.     metoprolol 50 MG tablet  Commonly known as:  LOPRESSOR  Take 1 tablet (50 mg total) by mouth 2 (two) times daily.     morphine CONCENTRATE 10 MG/0.5ML Soln concentrated solution  Take 0.25 mLs (5 mg total) by mouth every 3 (three) hours as needed for shortness of breath (dyspnea).     pantoprazole 40 MG tablet  Commonly known as:  PROTONIX  Take 1 tablet (40 mg total) by mouth daily at 12 noon.     predniSONE 10 MG tablet    Commonly known as:  DELTASONE  40 mg x 5 days, 30 mg x 5 days, 20 mg x 5 days, 10 mg x 5 days then stop     PROAIR HFA 108 (90 Base) MCG/ACT inhaler  Generic drug:  albuterol  Inhale 1 puff into the lungs every 4 (four) hours as needed for wheezing or shortness of breath.     albuterol (2.5 MG/3ML) 0.083% nebulizer solution  Commonly known as:  PROVENTIL  Take 3 mLs (2.5 mg total) by nebulization every 3 (three) hours as needed for wheezing.     rivaroxaban 20 MG Tabs tablet  Commonly known as:  XARELTO  Take 1 tablet (20 mg total) by mouth daily.     TRESIBA FLEXTOUCH 100 UNIT/ML Sopn  Generic drug:  Insulin Degludec  Inject 20 Units into the skin daily.           Follow-up Information    Follow up with Rexene Edison, NP On 06/17/2015.   Specialty:  Pulmonary Disease   Why:  Appt at  11:45 AM >>> Address is [address], [address] in [place] (not the address above)   Contact information:   [address] [address] [phone]        Time coordinating discharge: 35 min  Signed:  Ayvah Caroll Alison Stalling   [facility] 05/27/2015, 1:46 PM

## 2015-05-27 NOTE — Progress Notes (Signed)
PULMONARY / CRITICAL CARE MEDICINE   Name: [name] MRN: [id number] DOB: 01-Jan-1956    ADMISSION DATE:  05/21/2015  REFERRING MD:  ER  CHIEF COMPLAINT:  Short of breath   STUDIES:   CULTURES: 4/14 Blood >>  4/15 Urine >> multiple species 4/15 Sputum >> negative 4/15 Pneumococcal Ag >> negative 4/15 Legionella Ag >> not done  ANTIBIOTICS: Rocephin 4/15 >> 4/18  Zithromax 4/15 >> 4/18  Doxy 4/18 >>  LINES/TUBES: 4/14 ETT >> 4/17  SIGNIFICANT EVENTS: 4/14  Admit 4/16 Tolerating some pressure support. 4/17  On SBT with low dose diprivan. Wants reintubation if extubation fails. Refuses BIPAP post extubation.  4/18  Extubated. Using [place] by mouth - 5L [place] 97%. Husband says this is baseline. If she uses [place] by nose at night - she gets deliriuous. On 2L [place] via mouth ->pulse ox 93%. She c/o dysuria . Also demanding to go to toilet by self - has agreed to see PT first. Med list has xanax but husband says never on it. Agrees to see pall care for class 4 dyspnea which is baseline. Denies seeing pulmonary esp at [facility].  On IV heparin - husband says is on xarelto x 8 years  Due to mini-strokes eval at [facility] 4/19  5L [place] at night and 3LNC day - via mouth. Stil wants to go to bathroom by self - PT and Pall care eval pending.    SUBJECTIVE:  Pt reports improvement in SOB with morphine.     VITAL SIGNS: BP 110/59 mmHg  Pulse 81  Temp(Src) 97.6 F (36.4 C) (Oral)  Resp 20  Ht '5\' 5"'$  (1.651 m)  Wt 226 lb 10.1 oz (102.8 kg)  BMI 37.71 kg/m2  SpO2 100%  VENTILATOR SETTINGS:     INTAKE / OUTPUT: I/O last 3 completed shifts: In: 4270 [P.O.:1520; I.V.:30; IV Piggyback:200] Out: 475 [Urine:475]  PHYSICAL EXAMINATION: General: obese, deconditioned, in bed.  Neuro: Oreitned x 3. Alert. Moves all 4s. In Bed HEENT:  [place] via mouth 5L O2.  Cardiovascular:  Regular, no murmur Lungs: better air movement, no wheeze but has barrel chest.  Abdomen:  Soft, non tender Musculoskeletal:  No  edema Skin:  Chronic redness around neck and chest  LABS:  PULMONARY  Recent Labs Lab 05/21/15 2155 05/21/15 2336 05/23/15 0447 05/23/15 0535  PHART 7.155* 7.306* 7.542* 7.472*  PCO2ART VALUE ABOVE REPORTABLE RANGE. 88.4* 50.1* 59.1*  PO2ART 65* 221.0* 51.0* 68.0*  HCO3 51.3* 44.2* 42.9* 43.1*  TCO2 55.9 47 44 45  O2SAT 89.7 100.0 89.0 94.0    CBC  Recent Labs Lab 05/25/15 0249 05/26/15 0410 05/27/15 0519  HGB 11.1* 11.0* 11.0*  HCT 39.0 39.9 39.6  WBC 9.2 8.3 8.3  PLT 178 171 167    COAGULATION  Recent Labs Lab 05/22/15 0553  INR 1.23    CHEMISTRY  Recent Labs Lab 05/21/15 2133  05/23/15 0323 05/24/15 0319 05/25/15 0249 05/26/15 0410 05/27/15 0519  NA 144  < > 141 144 146* 143 144  K 3.9  < > 4.2 4.2 3.8 3.5 3.7  CL 92*  < > 97* 98* 98* 97* 97*  CO2 46*  < > 34* 36* 40* 38* 38*  GLUCOSE 90  < > 245* 212* 133* 144* 146*  BUN 8  < > '17 18 12 8 9  '$ CREATININE 0.51  < > 0.49 0.47 0.55 0.57 0.55  CALCIUM 8.9  < > 8.0* 8.5* 8.6* 8.7* 8.8*  MG 1.7  --   --   --  2.0 1.9 1.9  PHOS  --   --   --   --  3.4 5.1* 3.5  < > = values in this interval not displayed. Estimated Creatinine Clearance: 87.8 mL/min (by C-G formula based on Cr of 0.55).   ENDOCRINE CBG (last 3)   Recent Labs  05/26/15 1657 05/26/15 2132 05/27/15 0624  GLUCAP 240* 200* 137*    DISCUSSION: 60 yo female,former smoker, admitted 4/14 with altered mental status and dyspnea from COPD exacerbation and CAP. She has hx of DM, CAD s/b CABG, HTN, HLD, CVA, Anxiety.  Required intubation in ICU.  Extubated 4/17 without difficulty.  Will not accept BiPAP due to claustrophobia.      ASSESSMENT / PLAN: A: Acute on chronic hypoxic/hypercapnic respiratory failure 2nd to AECOPD and CAP.  Required ICU stay with MV, extubated 4/17. Improved to tolerating 2L [place] during day, 5L at night.    P:   NO BIPAP EVER - her personal choice due to claustrophobia Pt would want reintubation if  needed Continue brovana + pulmicort BID >> discharge on scheduled duonebs Q6 with PRN albuterol Will need LABA / LAMA >> can set up in the office  Duonebs Q4 + Q3 PRN albuterol  Prednisone since 4/18 - wean to off over 12 days Doxycycline since 4/18 with stop date in place x 3 days Appreciate Palliative care for dyspnea mgmt  Follow up alpha 1 (sister died of copd) >> Follow up arranged as outpatient with Pulmonary    A:   Acute metabolic encephalopathy. Hx of CVA - mini TIAs >> husband reports she has been on anticoagulation for this. Husband again affirmed this 05/25/15 Deconditioning   P:   05/25/15  re-start xarelto (this needs to re-addressed as opd because xarelto is indicated only if there is A Fib in TIA and she will need 30d holter) PT efforts as tolerated    All other medical issues per [facility]  HTN CAD HLD Dysuria  Hypokalemia  Protein Calorie Malnutrition  Anemia  DM with Steroid Induced Hyperglycemia   PCCM will be available PRN.  Please call if new needs arise.   Noe Gens, NP-C [facility] Pgr: [phone] or if no answer [phone] 05/27/2015, 9:32 AM

## 2015-05-27 NOTE — Progress Notes (Signed)
PT Cancellation Note  Patient Details Name: [name] MRN: [id number] DOB: [birth year]   Cancelled Treatment:    Reason Eval/Treat Not Completed: Other (comment) (Pt refused stating she was too out of breath, cannula off).  Discharge is planned for today.   Ramond Dial 05/27/2015, 2:34 PM   Mee Hives, PT MS Acute Rehab Dept. Number: [facility] [id number] and [place] [phone]

## 2015-05-27 NOTE — Progress Notes (Signed)
D/C orders received, pt for D/C home today.  IV and telemetry D/C.  Rx and D/C instructions given with verbalized understanding.  Family at bedside to assist with D/C.  Staff brought pt downstairs via wheelchair.  

## 2015-05-27 NOTE — Care Management Note (Signed)
Case Management Note  Patient Details  Name: [name] MRN: [id number] Date of Birth: 05/15/1955  Subjective/Objective:                    Action/Plan: Patient discharging home with palliative care services. CM met with the patient and provided her a list of palliative care agencies in the [place] area. [facility]. CM spoke with Verdis Frederickson at [facility] and they accepted the referral. Information requested faxed to the number provided. Will update the bedside RN.   Expected Discharge Date:                  Expected Discharge Plan:  [place]  In-House Referral:     Discharge planning Services  CM Consult  Post Acute Care Choice:    Choice offered to:  Patient  DME Arranged:    DME Agency:     HH Arranged:   (palliative care services) [place] Agency:  [facility]  Status of Service:  Completed, signed off  Medicare Important Message Given:    Date Medicare IM Given:    Medicare IM give by:    Date Additional Medicare IM Given:    Additional Medicare Important Message give by:     If discussed at [place] of Stay Meetings, dates discussed:    Additional Comments:  Pollie Friar, RN 05/27/2015, 2:48 PM

## 2015-05-27 NOTE — Progress Notes (Signed)
With this AM tx pt encouraged to leave nasal cannula in her nose.  Upon arrival for tx this afternoon nasal cannula sitting just above mouth not near nose or blowing into mouth.  Pt refuses to put in her nose.

## 2015-06-17 ENCOUNTER — Inpatient Hospital Stay: Payer: 59 | Admitting: Adult Health

## 2015-06-17 ENCOUNTER — Inpatient Hospital Stay: Payer: 59 | Admitting: Internal Medicine

## 2015-12-02 ENCOUNTER — Inpatient Hospital Stay (HOSPITAL_COMMUNITY)
Admission: EM | Admit: 2015-12-02 | Discharge: 2015-12-08 | DRG: 208 | Disposition: A | Discharge status: Home Health | Payer: 59 | Attending: Internal Medicine | Admitting: Internal Medicine

## 2015-12-02 ENCOUNTER — Emergency Department (HOSPITAL_COMMUNITY): Payer: 59

## 2015-12-02 ENCOUNTER — Encounter (HOSPITAL_COMMUNITY): Payer: Self-pay | Admitting: Emergency Medicine

## 2015-12-02 DIAGNOSIS — G4733 Obstructive sleep apnea (adult) (pediatric): Secondary | ICD-10-CM | POA: Diagnosis present

## 2015-12-02 DIAGNOSIS — Z9119 Patient's noncompliance with other medical treatment and regimen: Secondary | ICD-10-CM

## 2015-12-02 DIAGNOSIS — G9340 Encephalopathy, unspecified: Secondary | ICD-10-CM | POA: Diagnosis not present

## 2015-12-02 DIAGNOSIS — E669 Obesity, unspecified: Secondary | ICD-10-CM | POA: Diagnosis present

## 2015-12-02 DIAGNOSIS — E875 Hyperkalemia: Secondary | ICD-10-CM | POA: Diagnosis present

## 2015-12-02 DIAGNOSIS — J9622 Acute and chronic respiratory failure with hypercapnia: Secondary | ICD-10-CM | POA: Diagnosis present

## 2015-12-02 DIAGNOSIS — R000 Tachycardia, unspecified: Secondary | ICD-10-CM | POA: Diagnosis not present

## 2015-12-02 DIAGNOSIS — J441 Chronic obstructive pulmonary disease with (acute) exacerbation: Secondary | ICD-10-CM | POA: Diagnosis present

## 2015-12-02 DIAGNOSIS — F40240 Claustrophobia: Secondary | ICD-10-CM | POA: Diagnosis present

## 2015-12-02 DIAGNOSIS — I509 Heart failure, unspecified: Secondary | ICD-10-CM | POA: Diagnosis not present

## 2015-12-02 DIAGNOSIS — Z6831 Body mass index (BMI) 31.0-31.9, adult: Secondary | ICD-10-CM

## 2015-12-02 DIAGNOSIS — J9690 Respiratory failure, unspecified, unspecified whether with hypoxia or hypercapnia: Secondary | ICD-10-CM

## 2015-12-02 DIAGNOSIS — R222 Localized swelling, mass and lump, trunk: Secondary | ICD-10-CM | POA: Diagnosis present

## 2015-12-02 DIAGNOSIS — G9341 Metabolic encephalopathy: Secondary | ICD-10-CM | POA: Diagnosis present

## 2015-12-02 DIAGNOSIS — E872 Acidosis: Secondary | ICD-10-CM | POA: Diagnosis present

## 2015-12-02 DIAGNOSIS — Z951 Presence of aortocoronary bypass graft: Secondary | ICD-10-CM

## 2015-12-02 DIAGNOSIS — J9601 Acute respiratory failure with hypoxia: Secondary | ICD-10-CM | POA: Diagnosis not present

## 2015-12-02 DIAGNOSIS — Z9981 Dependence on supplemental oxygen: Secondary | ICD-10-CM

## 2015-12-02 DIAGNOSIS — I959 Hypotension, unspecified: Secondary | ICD-10-CM | POA: Diagnosis not present

## 2015-12-02 DIAGNOSIS — Z9289 Personal history of other medical treatment: Secondary | ICD-10-CM

## 2015-12-02 DIAGNOSIS — R451 Restlessness and agitation: Secondary | ICD-10-CM | POA: Diagnosis not present

## 2015-12-02 DIAGNOSIS — Z7901 Long term (current) use of anticoagulants: Secondary | ICD-10-CM

## 2015-12-02 DIAGNOSIS — L8990 Pressure ulcer of unspecified site, unspecified stage: Secondary | ICD-10-CM | POA: Insufficient documentation

## 2015-12-02 DIAGNOSIS — I252 Old myocardial infarction: Secondary | ICD-10-CM | POA: Diagnosis not present

## 2015-12-02 DIAGNOSIS — I10 Essential (primary) hypertension: Secondary | ICD-10-CM | POA: Diagnosis not present

## 2015-12-02 DIAGNOSIS — J810 Acute pulmonary edema: Secondary | ICD-10-CM | POA: Diagnosis not present

## 2015-12-02 DIAGNOSIS — J9621 Acute and chronic respiratory failure with hypoxia: Secondary | ICD-10-CM | POA: Diagnosis present

## 2015-12-02 DIAGNOSIS — E119 Type 2 diabetes mellitus without complications: Secondary | ICD-10-CM | POA: Diagnosis present

## 2015-12-02 DIAGNOSIS — Z825 Family history of asthma and other chronic lower respiratory diseases: Secondary | ICD-10-CM

## 2015-12-02 DIAGNOSIS — Z881 Allergy status to other antibiotic agents status: Secondary | ICD-10-CM

## 2015-12-02 DIAGNOSIS — Z7951 Long term (current) use of inhaled steroids: Secondary | ICD-10-CM

## 2015-12-02 DIAGNOSIS — I2510 Atherosclerotic heart disease of native coronary artery without angina pectoris: Secondary | ICD-10-CM | POA: Diagnosis present

## 2015-12-02 DIAGNOSIS — Z79899 Other long term (current) drug therapy: Secondary | ICD-10-CM | POA: Diagnosis not present

## 2015-12-02 DIAGNOSIS — Z4659 Encounter for fitting and adjustment of other gastrointestinal appliance and device: Secondary | ICD-10-CM

## 2015-12-02 DIAGNOSIS — Z794 Long term (current) use of insulin: Secondary | ICD-10-CM | POA: Diagnosis not present

## 2015-12-02 DIAGNOSIS — R918 Other nonspecific abnormal finding of lung field: Secondary | ICD-10-CM

## 2015-12-02 DIAGNOSIS — Z8673 Personal history of transient ischemic attack (TIA), and cerebral infarction without residual deficits: Secondary | ICD-10-CM | POA: Diagnosis not present

## 2015-12-02 DIAGNOSIS — Z87891 Personal history of nicotine dependence: Secondary | ICD-10-CM

## 2015-12-02 DIAGNOSIS — Z8249 Family history of ischemic heart disease and other diseases of the circulatory system: Secondary | ICD-10-CM

## 2015-12-02 LAB — CBC WITH DIFFERENTIAL/PLATELET
BASOS ABS: 0 10*3/uL (ref 0.0–0.1)
Basophils Relative: 0 %
EOS PCT: 0 %
Eosinophils Absolute: 0 10*3/uL (ref 0.0–0.7)
HCT: 51.2 % — ABNORMAL HIGH (ref 36.0–46.0)
Hemoglobin: 14 g/dL (ref 12.0–15.0)
LYMPHS PCT: 13 %
Lymphs Abs: 1.3 10*3/uL (ref 0.7–4.0)
MCH: 28.7 pg (ref 26.0–34.0)
MCHC: 27.3 g/dL — ABNORMAL LOW (ref 30.0–36.0)
MCV: 104.9 fL — AB (ref 78.0–100.0)
Monocytes Absolute: 0.6 10*3/uL (ref 0.1–1.0)
Monocytes Relative: 6 %
Neutro Abs: 8.1 10*3/uL — ABNORMAL HIGH (ref 1.7–7.7)
Neutrophils Relative %: 81 %
PLATELETS: 190 10*3/uL (ref 150–400)
RBC: 4.88 MIL/uL (ref 3.87–5.11)
RDW: 14.9 % (ref 11.5–15.5)
WBC: 9.9 10*3/uL (ref 4.0–10.5)

## 2015-12-02 LAB — I-STAT ARTERIAL BLOOD GAS, ED
Acid-Base Excess: 17 mmol/L — ABNORMAL HIGH (ref 0.0–2.0)
Bicarbonate: 47.6 mmol/L — ABNORMAL HIGH (ref 20.0–28.0)
O2 Saturation: 72 %
PH ART: 7.357 (ref 7.350–7.450)
PO2 ART: 43 mmHg — AB (ref 83.0–108.0)
Patient temperature: 99.3
TCO2: >50 mmol/L (ref 0–100)
pCO2 arterial: 85.2 mmHg (ref 32.0–48.0)

## 2015-12-02 LAB — COMPREHENSIVE METABOLIC PANEL
ALK PHOS: 75 U/L (ref 38–126)
ALT: 17 U/L (ref 14–54)
ANION GAP: 11 (ref 5–15)
AST: 34 U/L (ref 15–41)
Albumin: 3.3 g/dL — ABNORMAL LOW (ref 3.5–5.0)
BUN: 20 mg/dL (ref 6–20)
CALCIUM: 9.5 mg/dL (ref 8.9–10.3)
CHLORIDE: 89 mmol/L — AB (ref 101–111)
CO2: 42 mmol/L — AB (ref 22–32)
Creatinine, Ser: 0.73 mg/dL (ref 0.44–1.00)
GFR calc non Af Amer: >60 mL/min (ref >60)
Glucose, Bld: 159 mg/dL — ABNORMAL HIGH (ref 65–99)
POTASSIUM: 5.6 mmol/L — AB (ref 3.5–5.1)
SODIUM: 142 mmol/L (ref 135–145)
Total Bilirubin: 1.8 mg/dL — ABNORMAL HIGH (ref 0.3–1.2)
Total Protein: 7.1 g/dL (ref 6.5–8.1)

## 2015-12-02 LAB — BRAIN NATRIURETIC PEPTIDE: B Natriuretic Peptide: 184.6 pg/mL — ABNORMAL HIGH (ref 0.0–100.0)

## 2015-12-02 LAB — I-STAT CG4 LACTIC ACID, ED: Lactic Acid, Venous: 2.4 mmol/L (ref 0.5–1.9)

## 2015-12-02 LAB — TROPONIN I

## 2015-12-02 LAB — CBG MONITORING, ED: GLUCOSE-CAPILLARY: 161 mg/dL — AB (ref 65–99)

## 2015-12-02 MED ORDER — DEXTROSE 5 % IV SOLN
500.0000 mg | Freq: Once | INTRAVENOUS | Status: AC
  Administered 2015-12-03: 500 mg via INTRAVENOUS
  Filled 2015-12-02: qty 500

## 2015-12-02 MED ORDER — HEPARIN (PORCINE) IN NACL 100-0.45 UNIT/ML-% IJ SOLN: 1050.0000 [IU]/h | INTRAMUSCULAR | Status: DC

## 2015-12-02 MED ORDER — ARFORMOTEROL TARTRATE 15 MCG/2ML IN NEBU
15.0000 ug | INHALATION_SOLUTION | Freq: Two times a day (BID) | RESPIRATORY_TRACT | Status: DC
  Administered 2015-12-03 – 2015-12-08 (×11): 15 ug via RESPIRATORY_TRACT
  Filled 2015-12-02 (×12): qty 2

## 2015-12-02 MED ORDER — SUCCINYLCHOLINE CHLORIDE 20 MG/ML IJ SOLN
120.0000 mg | Freq: Once | INTRAMUSCULAR | Status: AC
  Administered 2015-12-02: 120 mg via INTRAVENOUS
  Filled 2015-12-02: qty 6

## 2015-12-02 MED ORDER — ALBUTEROL (5 MG/ML) CONTINUOUS INHALATION SOLN
15.0000 mg/h | INHALATION_SOLUTION | Freq: Once | RESPIRATORY_TRACT | Status: AC
  Administered 2015-12-02: 15 mg/h via RESPIRATORY_TRACT
  Filled 2015-12-02: qty 20

## 2015-12-02 MED ORDER — FENTANYL CITRATE (PF) 100 MCG/2ML IJ SOLN
50.0000 ug | Freq: Once | INTRAMUSCULAR | Status: AC
  Administered 2015-12-02: 50 ug via INTRAVENOUS
  Filled 2015-12-02: qty 2

## 2015-12-02 MED ORDER — MIDAZOLAM HCL 2 MG/2ML IJ SOLN
4.0000 mg | Freq: Once | INTRAMUSCULAR | Status: AC
  Administered 2015-12-02: 4 mg via INTRAVENOUS

## 2015-12-02 MED ORDER — METHYLPREDNISOLONE SODIUM SUCC 40 MG IJ SOLR
40.0000 mg | Freq: Two times a day (BID) | INTRAMUSCULAR | Status: DC
  Administered 2015-12-03 – 2015-12-06 (×7): 40 mg via INTRAVENOUS
  Filled 2015-12-02 (×7): qty 1

## 2015-12-02 MED ORDER — INSULIN ASPART 100 UNIT/ML ~~LOC~~ SOLN
0.0000 [IU] | SUBCUTANEOUS | Status: DC
  Administered 2015-12-03: 3 [IU] via SUBCUTANEOUS
  Administered 2015-12-03: 7 [IU] via SUBCUTANEOUS
  Administered 2015-12-03: 4 [IU] via SUBCUTANEOUS
  Administered 2015-12-03: 7 [IU] via SUBCUTANEOUS
  Administered 2015-12-03: 11 [IU] via SUBCUTANEOUS
  Administered 2015-12-03: 15 [IU] via SUBCUTANEOUS
  Administered 2015-12-04 (×3): 4 [IU] via SUBCUTANEOUS
  Administered 2015-12-04: 11 [IU] via SUBCUTANEOUS
  Administered 2015-12-04 (×2): 7 [IU] via SUBCUTANEOUS
  Administered 2015-12-05: 4 [IU] via SUBCUTANEOUS
  Administered 2015-12-05: 7 [IU] via SUBCUTANEOUS
  Administered 2015-12-05: 11 [IU] via SUBCUTANEOUS
  Administered 2015-12-05 (×2): 4 [IU] via SUBCUTANEOUS
  Administered 2015-12-05: 15 [IU] via SUBCUTANEOUS
  Administered 2015-12-06 (×2): 7 [IU] via SUBCUTANEOUS
  Administered 2015-12-06: 3 [IU] via SUBCUTANEOUS
  Administered 2015-12-06: 11 [IU] via SUBCUTANEOUS
  Administered 2015-12-06: 4 [IU] via SUBCUTANEOUS
  Administered 2015-12-07 (×2): 11 [IU] via SUBCUTANEOUS
  Administered 2015-12-07: 4 [IU] via SUBCUTANEOUS
  Administered 2015-12-07: 20 [IU] via SUBCUTANEOUS
  Administered 2015-12-08: 7 [IU] via SUBCUTANEOUS
  Administered 2015-12-08: 3 [IU] via SUBCUTANEOUS

## 2015-12-02 MED ORDER — IPRATROPIUM-ALBUTEROL 0.5-2.5 (3) MG/3ML IN SOLN
3.0000 mL | Freq: Four times a day (QID) | RESPIRATORY_TRACT | Status: DC
  Administered 2015-12-03 – 2015-12-08 (×23): 3 mL via RESPIRATORY_TRACT
  Filled 2015-12-02 (×23): qty 3

## 2015-12-02 MED ORDER — PANTOPRAZOLE SODIUM 40 MG PO PACK
40.0000 mg | PACK | Freq: Every day | ORAL | Status: DC
  Administered 2015-12-03 – 2015-12-04 (×2): 40 mg
  Filled 2015-12-02 (×4): qty 20

## 2015-12-02 MED ORDER — BUDESONIDE 0.5 MG/2ML IN SUSP
0.5000 mg | Freq: Two times a day (BID) | RESPIRATORY_TRACT | Status: DC
  Administered 2015-12-03 – 2015-12-08 (×11): 0.5 mg via RESPIRATORY_TRACT
  Filled 2015-12-02 (×11): qty 2

## 2015-12-02 MED ORDER — HEPARIN SODIUM (PORCINE) 5000 UNIT/ML IJ SOLN: 5000.0000 [IU] | Freq: Three times a day (TID) | INTRAMUSCULAR | Status: DC

## 2015-12-02 MED ORDER — FENTANYL 2500MCG IN NS 250ML (10MCG/ML) PREMIX INFUSION
25.0000 ug/h | INTRAVENOUS | Status: DC
  Administered 2015-12-02: 25 ug/h via INTRAVENOUS
  Filled 2015-12-02: qty 250

## 2015-12-02 MED ORDER — METHYLPREDNISOLONE SODIUM SUCC 40 MG IJ SOLR: 40.0000 mg | Freq: Two times a day (BID) | INTRAMUSCULAR | Status: DC

## 2015-12-02 MED ORDER — FENTANYL 2500MCG IN NS 250ML (10MCG/ML) PREMIX INFUSION
25.0000 ug/h | INTRAVENOUS | Status: DC
  Administered 2015-12-02: 50 ug/h via INTRAVENOUS
  Administered 2015-12-03: 200 ug/h via INTRAVENOUS
  Administered 2015-12-04: 250 ug/h via INTRAVENOUS
  Administered 2015-12-04 – 2015-12-05 (×2): 200 ug/h via INTRAVENOUS
  Filled 2015-12-02 (×4): qty 250

## 2015-12-02 MED ORDER — ATORVASTATIN CALCIUM 20 MG PO TABS
20.0000 mg | ORAL_TABLET | Freq: Every day | ORAL | Status: DC
  Administered 2015-12-03 – 2015-12-07 (×4): 20 mg
  Filled 2015-12-02 (×6): qty 1

## 2015-12-02 MED ORDER — MIDAZOLAM HCL 2 MG/2ML IJ SOLN
INTRAMUSCULAR | Status: AC
  Administered 2015-12-02: 4 mg via INTRAVENOUS
  Filled 2015-12-02: qty 2

## 2015-12-02 MED ORDER — PROPOFOL 1000 MG/100ML IV EMUL
5.0000 ug/kg/min | INTRAVENOUS | Status: DC
  Administered 2015-12-02: 5.041 ug/kg/min via INTRAVENOUS
  Administered 2015-12-03: 50 ug/kg/min via INTRAVENOUS
  Administered 2015-12-03: 20 ug/kg/min via INTRAVENOUS
  Administered 2015-12-03: 15 ug/kg/min via INTRAVENOUS
  Administered 2015-12-04: 18 ug/kg/min via INTRAVENOUS
  Administered 2015-12-04: 22 ug/kg/min via INTRAVENOUS
  Administered 2015-12-04: 30 ug/kg/min via INTRAVENOUS
  Administered 2015-12-04: 20 ug/kg/min via INTRAVENOUS
  Administered 2015-12-05: 30 ug/kg/min via INTRAVENOUS
  Filled 2015-12-02 (×9): qty 100

## 2015-12-02 MED ORDER — IPRATROPIUM BROMIDE 0.02 % IN SOLN
0.5000 mg | Freq: Once | RESPIRATORY_TRACT | Status: AC
  Administered 2015-12-02: 0.5 mg via RESPIRATORY_TRACT
  Filled 2015-12-02: qty 2.5

## 2015-12-02 MED ORDER — FENTANYL BOLUS VIA INFUSION
50.0000 ug | INTRAVENOUS | Status: DC | PRN
  Administered 2015-12-04 – 2015-12-05 (×2): 50 ug via INTRAVENOUS
  Filled 2015-12-02: qty 50

## 2015-12-02 MED ORDER — ETOMIDATE 2 MG/ML IV SOLN
30.0000 mg | Freq: Once | INTRAVENOUS | Status: AC
  Administered 2015-12-02: 30 mg via INTRAVENOUS

## 2015-12-02 MED ORDER — METHYLPREDNISOLONE SODIUM SUCC 125 MG IJ SOLR
125.0000 mg | Freq: Once | INTRAMUSCULAR | Status: AC
  Administered 2015-12-02: 125 mg via INTRAVENOUS
  Filled 2015-12-02: qty 2

## 2015-12-02 MED ORDER — DEXTROSE 5 % IV SOLN
2.0000 g | INTRAVENOUS | Status: DC
  Administered 2015-12-02 – 2015-12-05 (×4): 2 g via INTRAVENOUS
  Filled 2015-12-02 (×5): qty 2

## 2015-12-02 MED ORDER — MIDAZOLAM HCL 2 MG/2ML IJ SOLN
INTRAMUSCULAR | Status: AC
  Filled 2015-12-02: qty 2

## 2015-12-02 MED ORDER — SODIUM CHLORIDE 0.9 % IV SOLN
INTRAVENOUS | Status: DC
  Administered 2015-12-02 – 2015-12-03 (×2): via INTRAVENOUS

## 2015-12-02 MED ORDER — HEPARIN (PORCINE) IN NACL 100-0.45 UNIT/ML-% IJ SOLN
1050.0000 [IU]/h | INTRAMUSCULAR | Status: DC
  Administered 2015-12-03: 1050 [IU]/h via INTRAVENOUS
  Filled 2015-12-02: qty 250

## 2015-12-02 MED ORDER — SODIUM CHLORIDE 0.9 % IV SOLN: 250.0000 mL | INTRAVENOUS | Status: DC | PRN

## 2015-12-02 NOTE — Progress Notes (Addendum)
ANTICOAGULATION CONSULT NOTE - Initial Consult  Pharmacy Consult for heparin Indication: atrial fibrillation  Allergies  Allergen Reactions  . Ciprofloxacin Rash    REACTION: hives/rash    Patient Measurements: Height: '5\' 7"'$  (170.2 cm) (measured per RRT) Weight: 226 lb (102.5 kg) IBW/kg (Calculated) : 61.6 Heparin Dosing Weight: 73kg  Vital Signs: Temp: 99.3 F (37.4 C) (10/26 2108) Temp Source: Oral (10/26 2108) BP: 121/89 (10/26 2200) Pulse Rate: 93 (10/26 2200)  Labs:  Recent Labs  12/02/15 2116  HGB 14.0  HCT 51.2*  PLT 190  CREATININE 0.73  TROPONINI <0.03    Estimated Creatinine Clearance: 89.8 mL/min (by C-G formula based on SCr of 0.73 mg/dL).   Assessment: 48 YOF on Xarelto PTA- per CareEverywhere notes, she is on for atherosclerosis of coronary artery of native valve and angina. Last dose was this afternoon ~1400. She is to start heparin while she cannot take Xarelto due to intubation. CrCl is >71m/min, therefore we will not start heparin until 24h after last dose.  Baseline Hgb 14, plts 190- no bleeding noted.  Goal of Therapy:  Heparin level 0.3-0.7 units/ml Monitor platelets by anticoagulation protocol: Yes   Plan:  -baseline heparin level and aPTT now -start heparin without bolus at rate of 1050 units/hr on 10/27 at 1400 -first heparin level and aPTT 6h after heparin started (at 2000 on 10/27) -daily heparin level and aPTT until correlating along with daily CBC  Verna Hamon D. Kerrin Markman, PharmD, BCPS Clinical Pharmacist Pager: [phone]/26/2017 10:59 PM

## 2015-12-02 NOTE — Progress Notes (Signed)
RN aware of critical ABG results

## 2015-12-02 NOTE — Progress Notes (Addendum)
Ceftriaxone for CAP per pharmacy ordered.  Ceftriaxone does not need renal adjustment.  [employer] policy allows pharmacy to change the ordered dose based on indication, therefore a consult is not required.   Plan: -ceftriaxone 2g IV q24h due to size of patient -pharmacy to sign off as no adjustment needed.  Shahram Alexopoulos D. Abdirizak Richison, PharmD, BCPS Clinical Pharmacist Pager: [phone] 12/02/2015 9:55 PM

## 2015-12-02 NOTE — H&P (Signed)
PULMONARY / CRITICAL CARE MEDICINE   Name: [name] MRN: [id number] DOB: February 11, 1955    ADMISSION DATE:  12/02/2015 CONSULTATION DATE:  12/02/15  REFERRING MD:  Regenia Skeeter - EDP  CHIEF COMPLAINT:  SOB  HISTORY OF PRESENT ILLNESS:  Pt is encephelopathic; therefore, this HPI is obtained from chart review. [name] is a 60 y.o. female with PMH as outlined below including known COPD (on chronic 5 L O2 and 24/7) as well as history of noncompliance with outpatient follow-up. She had hospitalization here at [facility] in April where she required intubation. She was instructed to follow-up with Dr. Elsworth Soho however she never did so. She was brought to [facility] ED 12/02/15 with SOB. Husband reports that SOB is ongoing on a daily basis however over the past 2 weeks it has been worse. Symptoms have exacerbated over the past 2-3 days. Husband states that she got to the point where she could barely breathe at all with minimal activity and this is what prompted them to call EMS.  In ED, she was in significant respiratory distress for which she was immediately intubated. She apparently has severe claustrophobia and has made it clear the past that she would never ever want BiPAP or CPAP. Husband states that she is so claustrophobic that she fears being buried after she passes due to being stuck underground.  She has not had any recent fevers/chills/sweats, headaches, chest pain, increasing wheezing, N/V/D, abdominal pain, myalgias, exposures to known sick contacts.  She is a former smoker, quit roughly 5 years ago.  Husband states that she only sees her doctor once or twice a year and that she doesn't like going to the doctor because she feels that she can barely breathe well enough to get there.  When asked why she has not followed up with pulmonary, husband states that she is just very stubborn. Of note, patient's son states that "if she could just get pain pills and Xanax", she probably be much better  off.  PAST MEDICAL HISTORY :  She  has a past medical history of Acute MI; Arthritis; COPD (chronic obstructive pulmonary disease) ([place]); Coronary artery disease; Diabetes mellitus without complication ([place]); Shortness of breath; and Stroke ([place]) (TIA ).  PAST SURGICAL HISTORY: She  has a past surgical history that includes Abdominal hysterectomy; Cardiac catheterization; and Coronary artery bypass graft.  Allergies  Allergen Reactions  . Ciprofloxacin Rash    REACTION: hives/rash    No current facility-administered medications on file prior to encounter.    Current Outpatient Prescriptions on File Prior to Encounter  Medication Sig  . albuterol (PROVENTIL) (2.5 MG/3ML) 0.083% nebulizer solution Take 3 mLs (2.5 mg total) by nebulization every 3 (three) hours as needed for wheezing.  Marland Kitchen atorvastatin (LIPITOR) 20 MG tablet Take 1 tablet (20 mg total) by mouth daily.  . budesonide (PULMICORT) 0.5 MG/2ML nebulizer solution Take 0.5 mg by nebulization 2 (two) times daily as needed (FOR SOB).   . insulin aspart (NOVOLOG FLEXPEN) 100 UNIT/ML SOPN FlexPen Inject 6 Units into the skin 3 (three) times daily with meals. (Patient taking differently: Inject 20 Units into the skin 3 (three) times daily with meals. )  . Insulin Degludec (TRESIBA FLEXTOUCH) 100 UNIT/ML SOPN Inject 20 Units into the skin daily.  . Ipratropium-Albuterol (COMBIVENT RESPIMAT) 20-100 MCG/ACT AERS respimat Inhale 1-2 puffs into the lungs every 4 (four) hours as needed for wheezing.  . isosorbide mononitrate (IMDUR) 60 MG 24 hr tablet Take 1 tablet (60 mg total) by mouth  daily.  . metoprolol tartrate (LOPRESSOR) 50 MG tablet Take 1 tablet (50 mg total) by mouth 2 (two) times daily.  . Morphine Sulfate (MORPHINE CONCENTRATE) 10 MG/0.5ML SOLN concentrated solution Take 0.25 mLs (5 mg total) by mouth every 3 (three) hours as needed for shortness of breath (dyspnea).  . predniSONE (DELTASONE) 10 MG tablet 40 mg x 5 days, 30 mg x 5  days, 20 mg x 5 days, 10 mg x 5 days then stop  . Rivaroxaban (XARELTO) 20 MG TABS tablet Take 1 tablet (20 mg total) by mouth daily.  . pantoprazole (PROTONIX) 40 MG tablet Take 1 tablet (40 mg total) by mouth daily at 12 noon. (Patient not taking: Reported on 12/02/2015)    FAMILY HISTORY:  Her    SOCIAL HISTORY: She  reports that she quit smoking about 6 years ago. Her smoking use included Cigarettes. She has a 120.00 pack-year smoking history. She has never used smokeless tobacco. She reports that she does not drink alcohol.  REVIEW OF SYSTEMS:  Unable to obtain as patient is encephalopathic.  SUBJECTIVE:  On vent, agitated.  VITAL SIGNS: BP 121/89   Pulse 93   Temp 99.3 F (37.4 C) (Oral)   Resp 22   Ht '5\' 7"'$  (1.702 m) Comment: measured per RRT  Wt 226 lb (102.5 kg)   SpO2 98%   BMI 35.40 kg/m   HEMODYNAMICS:    VENTILATOR SETTINGS: Vent Mode: PRVC FiO2 (%):  [60 %] 60 % Set Rate:  [24 bmp] 24 bmp Vt Set:  [480 mL] 480 mL PEEP:  [5 cmH20] 5 cmH20  INTAKE / OUTPUT: No intake/output data recorded.   PHYSICAL EXAMINATION: General: Chronically ill-appearing female, agitated on vent Neuro: Agitated on vent, moving all extremities. HEENT: [place]/AT. PERRL, sclerae anicteric. Cardiovascular: RRR, no M/R/G.  Lungs: Respirations even and unlabored.  Breath sounds distant, faint bilateral basilar wheeze. Abdomen: Obese, BS x 4, soft, NT/ND.  Musculoskeletal: No gross deformities, no edema.  Skin: Intact, warm, no rashes.  LABS:  BMET  Recent Labs Lab 12/02/15 2116  NA 142  K 5.6*  CL 89*  CO2 42*  BUN 20  CREATININE 0.73  GLUCOSE 159*    Electrolytes  Recent Labs Lab 12/02/15 2116  CALCIUM 9.5    CBC  Recent Labs Lab 12/02/15 2116  WBC 9.9  HGB 14.0  HCT 51.2*  PLT 190    Coag's No results for input(s): APTT, INR in the last 168 hours.  Sepsis Markers  Recent Labs Lab 12/02/15 2127  LATICACIDVEN 2.40*    ABG No results for  input(s): PHART, PCO2ART, PO2ART in the last 168 hours.  Liver Enzymes  Recent Labs Lab 12/02/15 2116  AST 34  ALT 17  ALKPHOS 75  BILITOT 1.8*  ALBUMIN 3.3*    Cardiac Enzymes  Recent Labs Lab 12/02/15 2116  TROPONINI <0.03    Glucose  Recent Labs Lab 12/02/15 2124  GLUCAP 161*    Imaging Dg Chest Portable 1 View  Result Date: 12/02/2015 CLINICAL DATA:  60 y/o  F; endotracheal tube. EXAM: PORTABLE CHEST 1 VIEW COMPARISON:  05/25/2015 chest radiograph FINDINGS: Normal cardiac silhouette given projection and technique. Post median sternotomy with wires and alignment. Endotracheal tube is 3.4 cm from the carina. Clear lungs. No pneumothorax. No pleural effusion. Post CABG. Bones are unremarkable. IMPRESSION: No active disease. Endotracheal tube approximately 3.4 cm from the carina. Electronically Signed   By: Kristine Garbe [name]   On: 12/02/2015 22:23  STUDIES:  CXR 10/26 > no active process.  CULTURES: Blood 10/26 >  Sputum 10/26 >   ANTIBIOTICS: Ceftriaxone 10/26 >   SIGNIFICANT EVENTS: 10/26 > admitted with presumed AECOPD requiring intubation.  LINES/TUBES: ETT 10/26 >   DISCUSSION: 60 y.o. female with history of COPD and noncompliance, admitted 10/26 with presumed AECOPD requiring intubation.  ASSESSMENT / PLAN:  PULMONARY A: Acute on chronic hypoxic and hypercarbic respiratory failure. COPD per report - no PFTs available. Hx intolerance to NIMV due to claustrophobia, noncompliance with outpatient follow-up. P:   Full vent support. Wean as able. VAP prevention measures. SBT in AM if able. Budesonide/Brovana, DuoNeb's. Solu-Medrol, Ceftriaxone. CXR in AM.  CARDIOVASCULAR A:  Hx CAD. P:  Continue preadmission atorvastatin. Hold preadmission Imdur. Repeat lactate.  RENAL A:   Mild hyperkalemia. P:   NS @ 75. BMP q4hrs x 2.  GASTROINTESTINAL A:   Obesity. GI prophylaxis. Nutrition. P:   SUP:  Pantoprazole. NPO.  HEMATOLOGIC A:   On chronic Xarelto - reportedly due to TIAs (? Unclear why on Xarelto?). VTE Prophylaxis. P:  Hold preadmission Xarelto. SCD's / heparin drip. CBC in AM.  INFECTIOUS A:   AECOPD P:   Abx as above (ceftriaxone).  Follow cultures as above.  ENDOCRINE A:   DM.   P:   SSI. Hold preadmission NovoLog, tresiba.  NEUROLOGIC A:   Acute encephalopathy. Hx TIA, arthritis. ? Concern for substance abuse - patient's son stated that if patient could get pain pills and Xanax that she would be much better off. P:   Sedation:  Propofol drip/fentanyl drip. RASS goal: 0 to -1. Daily WUA. Assess UDS. Hold preadmission gabapentin, morphine.  Family updated: Husband and son updated at bedside.  Interdisciplinary Family Meeting v Palliative Care Meeting:  Due by: 11/2.  CC time: 30 minutes.   Montey Hora, [facility] Pager: [phone]  or [phone] 12/02/2015, 10:45 PM  Attending Note:  I have examined patient, reviewed labs, studies and notes. I have discussed the case with [place], and I agree with the data and plans as amended above. 60 with severe COPD, obesity, chronic hypoxemic resp failure, chronic pain. She has experienced progressive worsening in congestion, dyspnea, wheeze for about 2 weeks. Even worse over the last 2 days. Presented to ED in resp distress and intubated. On my eval she is sedated but wakes easily to voice on propofol. she has tremor, strong cough. Very distant on exam with exp wheeze B. Diaphoretic, skin is cool. CXR without evidence CAP.  We will treat with steroids, BD, empiric abx that can be d/c soon if no evidence evolving infxn. Work on SBT in coming days as bronchospasm improves. She was intubated for similar presentation in April. Functional capacity and QOL reported as poor. We will need to further discuss goals of care. Currently pt and family indicate that she would  want all life-saving interventions.  Independent critical care time is 40 minutes.   Baltazar Apo, MD, PhD 12/02/2015, 11:11 PM [facility] [phone] or if no answer [phone]

## 2015-12-02 NOTE — Progress Notes (Signed)
ABG collected on current vent settings: PRVC VT 480 (8cc) 24RR 60% +5peep

## 2015-12-02 NOTE — ED Triage Notes (Signed)
Pt brought in from home via EMS with shortness of breath O2 sats in the 60s and 70s on room air on scene. Pt is non cooperative and pulls everything off. She was given 5.'5mg'$  of duo neb in the field per EMS

## 2015-12-02 NOTE — ED Provider Notes (Signed)
[facility] DEPT Provider Note   CSN: [id number] Arrival date & time: 12/02/15  2103   LEVEL 5 CAVEAT - ALTERED MENTAL STATUS  History   Chief Complaint Chief Complaint  Patient presents with  . Shortness of Breath    HPI [name] is a 60 y.o. female.  HPI  60 year old female presents with shortness of breath, hypoxia, and altered mental status. Patient would not provide history and is confused and so the history is taken from the husband at the bedside. She has had shortness of breath for 10 days. Has been confused progressively over the last 4. Very similar to multiple prior COPD exacerbations. Was intubated in April 2017 for the same. Chronic cough, no known fevers per the husband. Has stopped doing her albuterol and stopped eating and drinking. Patient does not tolerate BiPAP well but has been intubated in the past and wants intubation if necessary per the husband. She will not let me examine her at this point in time.  Past Medical History:  Diagnosis Date  . Acute MI   . Arthritis   . COPD (chronic obstructive pulmonary disease) ([place])    home O2  . Coronary artery disease   . Diabetes mellitus without complication ([place])   . Shortness of breath   . Stroke [facility]) TIA     Patient Active Problem List   Diagnosis Date Noted  . COPD exacerbation ([place]) 12/02/2015  . Coronary artery disease without angina pectoris   . Acute encephalopathy   . Hyperkalemia   . Acute on chronic respiratory failure with hypoxia ([place])   . Morbid obesity ([place])   . Acute on chronic respiratory failure with hypoxia and hypercapnia ([facility])   . Acute hypercapnic respiratory failure ([place]) 05/22/2015  . Type 2 diabetes mellitus with complication, with long-term current use of insulin ([place]) 12/20/2012  . Atrial fibrillation ([place]) 12/20/2012  . Chronic respiratory failure ([place]) 12/20/2012  . C O P D WITH ACUTE EXACERBATION 03/13/2008  . HYPERLIPIDEMIA 03/11/2008  . Essential hypertension  03/11/2008  . CORONARY HEART DISEASE 03/11/2008    Past Surgical History:  Procedure Laterality Date  . ABDOMINAL HYSTERECTOMY    . CARDIAC CATHETERIZATION    . CORONARY ARTERY BYPASS GRAFT     2003    OB History    No data available       Home Medications    Prior to Admission medications   Medication Sig Start Date End Date Taking? Authorizing Provider  albuterol (PROVENTIL) (2.5 MG/3ML) 0.083% nebulizer solution Take 3 mLs (2.5 mg total) by nebulization every 3 (three) hours as needed for wheezing. 05/27/15  Yes Geradine Girt, DO  arformoterol (BROVANA) 15 MCG/2ML NEBU Inhale 2 mLs into the lungs 2 (two) times daily. 04/15/15 04/14/16 Yes Historical Provider, MD  atorvastatin (LIPITOR) 20 MG tablet Take 1 tablet (20 mg total) by mouth daily. 02/25/13  Yes Elsie Stain, MD  budesonide (PULMICORT) 0.5 MG/2ML nebulizer solution Take 0.5 mg by nebulization 2 (two) times daily as needed (FOR SOB).    Yes Historical Provider, MD  gabapentin (NEURONTIN) 100 MG capsule Take 100 mg by mouth 3 (three) times daily. 03/18/15 03/17/16 Yes Historical Provider, MD  insulin aspart (NOVOLOG FLEXPEN) 100 UNIT/ML SOPN FlexPen Inject 6 Units into the skin 3 (three) times daily with meals. Patient taking differently: Inject 20 Units into the skin 3 (three) times daily with meals.  12/18/12  Yes Costin Karlyne Greenspan, MD  Insulin Degludec (TRESIBA FLEXTOUCH) 100 UNIT/ML SOPN Inject  20 Units into the skin daily.   Yes Historical Provider, MD  Ipratropium-Albuterol (COMBIVENT RESPIMAT) 20-100 MCG/ACT AERS respimat Inhale 1-2 puffs into the lungs every 4 (four) hours as needed for wheezing. 02/25/13  Yes Elsie Stain, MD  isosorbide mononitrate (IMDUR) 60 MG 24 hr tablet Take 1 tablet (60 mg total) by mouth daily. 02/25/13  Yes Elsie Stain, MD  metoprolol tartrate (LOPRESSOR) 50 MG tablet Take 1 tablet (50 mg total) by mouth 2 (two) times daily. 05/27/15  Yes Geradine Girt, DO  Morphine Sulfate (MORPHINE  CONCENTRATE) 10 MG/0.5ML SOLN concentrated solution Take 0.25 mLs (5 mg total) by mouth every 3 (three) hours as needed for shortness of breath (dyspnea). 05/27/15  Yes Geradine Girt, DO  predniSONE (DELTASONE) 10 MG tablet 40 mg x 5 days, 30 mg x 5 days, 20 mg x 5 days, 10 mg x 5 days then stop 05/27/15  Yes Geradine Girt, DO  Rivaroxaban (XARELTO) 20 MG TABS tablet Take 1 tablet (20 mg total) by mouth daily. 02/25/13  Yes Elsie Stain, MD  pantoprazole (PROTONIX) 40 MG tablet Take 1 tablet (40 mg total) by mouth daily at 12 noon. Patient not taking: Reported on 12/02/2015 05/27/15   Geradine Girt, DO    Family History Family History  Problem Relation Age of Onset  . Lung cancer Brother   . Emphysema Brother   . COPD Sister   . Heart attack Sister   . Diabetes Brother   . Lung cancer Brother   . Emphysema Sister     Social History Social History  Substance Use Topics  . Smoking status: Former Smoker    Packs/day: 3.00    Years: 40.00    Types: Cigarettes    Quit date: 02/06/2009  . Smokeless tobacco: Never Used  . Alcohol use No     Allergies   Ciprofloxacin   Review of Systems Review of Systems  Unable to perform ROS: Mental status change     Physical Exam Updated Vital Signs BP 121/89   Pulse 93   Temp 99.3 F (37.4 C) (Oral)   Resp 22   Ht '5\' 7"'$  (1.702 m) Comment: measured per RRT  Wt 226 lb (102.5 kg)   SpO2 100%   BMI 35.40 kg/m   Physical Exam  Constitutional: She appears well-developed and well-nourished.  HENT:  Head: Normocephalic and atraumatic.  Right Ear: External ear normal.  Left Ear: External ear normal.  Nose: Nose normal.  Eyes: Right eye exhibits no discharge. Left eye exhibits no discharge.  Cardiovascular: Regular rhythm and normal heart sounds.  Tachycardia present.   Pulmonary/Chest: Accessory muscle usage present. Tachypnea noted. She has decreased breath sounds.  Abdominal: Soft. There is no tenderness.  Neurological: She is  alert. She is disoriented.  Awake, alert to self and place, cannot tell me date/time  Skin: Skin is warm and dry.  Nursing note and vitals reviewed.    ED Treatments / Results  Labs (all labs ordered are listed, but only abnormal results are displayed) Labs Reviewed  COMPREHENSIVE METABOLIC PANEL - Abnormal; Notable for the following:       Result Value   Potassium 5.6 (*)    Chloride 89 (*)    CO2 42 (*)    Glucose, Bld 159 (*)    Albumin 3.3 (*)    Total Bilirubin 1.8 (*)    All other components within normal limits  BRAIN NATRIURETIC PEPTIDE - Abnormal; Notable for  the following:    B Natriuretic Peptide 184.6 (*)    All other components within normal limits  CBC WITH DIFFERENTIAL/PLATELET - Abnormal; Notable for the following:    HCT 51.2 (*)    MCV 104.9 (*)    MCHC 27.3 (*)    Neutro Abs 8.1 (*)    All other components within normal limits  CBG MONITORING, ED - Abnormal; Notable for the following:    Glucose-Capillary 161 (*)    All other components within normal limits  I-STAT CG4 LACTIC ACID, ED - Abnormal; Notable for the following:    Lactic Acid, Venous 2.40 (*)    All other components within normal limits  CULTURE, BLOOD (ROUTINE X 2)  CULTURE, BLOOD (ROUTINE X 2)  CULTURE, RESPIRATORY (NON-EXPECTORATED)  CULTURE, BLOOD (ROUTINE X 2)  CULTURE, BLOOD (ROUTINE X 2)  TROPONIN I  CBC  BLOOD GAS, ARTERIAL  MAGNESIUM  PHOSPHORUS  LACTIC ACID, PLASMA  RAPID URINE DRUG SCREEN, HOSP PERFORMED  HEPARIN LEVEL (UNFRACTIONATED)  APTT  BASIC METABOLIC PANEL  BASIC METABOLIC PANEL  I-STAT ARTERIAL BLOOD GAS, ED    EKG  EKG Interpretation  Date/Time:  Thursday December 02 2015 21:40:11 EDT Ventricular Rate:  98 PR Interval:    QRS Duration: 103 QT Interval:  327 QTC Calculation: 418 R Axis:   74 Text Interpretation:  Normal sinus rhythm Consider right atrial enlargement Low voltage, precordial leads Baseline wander in lead(s) V6 Confirmed by Karie Skowron MD,  Rickard Kennerly [phone]) on 12/02/2015 11:06:32 PM       Radiology Dg Chest Portable 1 View  Result Date: 12/02/2015 CLINICAL DATA:  60 y/o  F; endotracheal tube. EXAM: PORTABLE CHEST 1 VIEW COMPARISON:  05/25/2015 chest radiograph FINDINGS: Normal cardiac silhouette given projection and technique. Post median sternotomy with wires and alignment. Endotracheal tube is 3.4 cm from the carina. Clear lungs. No pneumothorax. No pleural effusion. Post CABG. Bones are unremarkable. IMPRESSION: No active disease. Endotracheal tube approximately 3.4 cm from the carina. Electronically Signed   By: Kristine Garbe [name]   On: 12/02/2015 22:23    Procedures Procedure Name: Intubation Date/Time: 12/02/2015 9:40 PM Performed by: Sherwood Gambler Pre-anesthesia Checklist: Patient identified, Emergency Drugs available, Suction available, Patient being monitored and Timeout performed Oxygen Delivery Method: Non-rebreather mask Preoxygenation: Pre-oxygenation with 100% oxygen Intubation Type: Rapid sequence and IV induction Ventilation: Mask ventilation without difficulty Laryngoscope Size: Glidescope and 3 Grade View: Grade I Tube size: 7.5 mm Number of attempts: 1 Airway Equipment and Method: Video-laryngoscopy Placement Confirmation: ETT inserted through vocal cords under direct vision,  Positive ETCO2 and Breath sounds checked- equal and bilateral Secured at: 22 (lips) cm Tube secured with: Tape      (including critical care time)  CRITICAL CARE Performed by: Sherwood Gambler T   Total critical care time: 40 minutes  Critical care time was exclusive of separately billable procedures and treating other patients.  Critical care was necessary to treat or prevent imminent or life-threatening deterioration.  Critical care was time spent personally by me on the following activities: development of treatment plan with patient and/or surrogate as well as nursing, discussions with consultants,  evaluation of patient's response to treatment, examination of patient, obtaining history from patient or surrogate, ordering and performing treatments and interventions, ordering and review of laboratory studies, ordering and review of radiographic studies, pulse oximetry and re-evaluation of patient's condition.   Medications Ordered in ED Medications  propofol (DIPRIVAN) 1000 MG/100ML infusion (40 mcg/kg/min  102.5 kg Intravenous Rate/Dose Change  12/02/15 2308)  azithromycin (ZITHROMAX) 500 mg in dextrose 5 % 250 mL IVPB (not administered)  cefTRIAXone (ROCEPHIN) 2 g in dextrose 5 % 50 mL IVPB (not administered)  midazolam (VERSED) 2 MG/2ML injection (not administered)  fentaNYL 2548mg in NS 2520m(1067mml) infusion-PREMIX (125 mcg/hr Intravenous Rate/Dose Change 12/02/15 2308)  fentaNYL (SUBLIMAZE) bolus via infusion 50 mcg (not administered)  budesonide (PULMICORT) nebulizer solution 0.5 mg (0.5 mg Nebulization Not Given 12/02/15 2253)  arformoterol (BROVANA) nebulizer solution 15 mcg (15 mcg Nebulization Not Given 12/02/15 2253)  ipratropium-albuterol (DUONEB) 0.5-2.5 (3) MG/3ML nebulizer solution 3 mL (3 mLs Nebulization Not Given 12/02/15 2245)  atorvastatin (LIPITOR) tablet 20 mg (not administered)  insulin aspart (novoLOG) injection 0-20 Units (not administered)  0.9 %  sodium chloride infusion ( Intravenous New Bag/Given 12/02/15 2250)  0.9 %  sodium chloride infusion (not administered)  pantoprazole sodium (PROTONIX) 40 mg/20 mL oral suspension 40 mg (not administered)  methylPREDNISolone sodium succinate (SOLU-MEDROL) 40 mg/mL injection 40 mg (not administered)  heparin ADULT infusion 100 units/mL (25000 units/250m54mdium chloride 0.45%) (not administered)  albuterol (PROVENTIL,VENTOLIN) solution continuous neb (15 mg/hr Nebulization Given 12/02/15 2204)  methylPREDNISolone sodium succinate (SOLU-MEDROL) 125 mg/2 mL injection 125 mg (125 mg Intravenous Given 12/02/15 2141)    ipratropium (ATROVENT) nebulizer solution 0.5 mg (0.5 mg Nebulization Given 12/02/15 2204)  etomidate (AMIDATE) injection 30 mg (30 mg Intravenous Given 12/02/15 2134)  succinylcholine (ANECTINE) injection 120 mg (120 mg Intravenous Given 12/02/15 2134)  midazolam (VERSED) injection 4 mg (4 mg Intravenous Given 12/02/15 2156)  fentaNYL (SUBLIMAZE) injection 50 mcg (50 mcg Intravenous Given 12/02/15 2304)     Initial Impression / Assessment and Plan / ED Course  I have reviewed the triage vital signs and the nursing notes.  Pertinent labs & imaging results that were available during my care of the patient were reviewed by me and considered in my medical decision making (see chart for details).  Clinical Course  Comment By Time  D/w Dr. WertMelvyn NovasU will come to admit ScotSherwood Gambler 10/26 2159    Initially, workup is limited as the patient will not allow me to examine her. However she is clearly altered and I discussed with the husband the options. She will not tolerate BiPAP and has not tolerated in the past. She is barely tolerating the nonrebreather which is required to keep her set above 60%. She was found to have sats at 60% on room air with EMS. After discussion with husband, I believe the best course of action is to intubate her and sedate her to help support her breathing with albuterol and ventilator support. She will be given antibiotics, steroids, and ICU Will admit.  Final Clinical Impressions(s) / ED Diagnoses   Final diagnoses:  [facility] chronic [facility] with [facility] New Prescriptions New Prescriptions   No medications on file     ScotSherwood Gambler 12/02/15 2310

## 2015-12-03 ENCOUNTER — Inpatient Hospital Stay (HOSPITAL_COMMUNITY): Payer: 59

## 2015-12-03 DIAGNOSIS — G9340 Encephalopathy, unspecified: Secondary | ICD-10-CM

## 2015-12-03 DIAGNOSIS — J810 Acute pulmonary edema: Secondary | ICD-10-CM

## 2015-12-03 DIAGNOSIS — J9601 Acute respiratory failure with hypoxia: Secondary | ICD-10-CM

## 2015-12-03 LAB — BASIC METABOLIC PANEL
Anion gap: 14 (ref 5–15)
Anion gap: 10 (ref 5–15)
BUN: 23 mg/dL — AB (ref 6–20)
BUN: 23 mg/dL — AB (ref 6–20)
CALCIUM: 9 mg/dL (ref 8.9–10.3)
CALCIUM: 8.7 mg/dL — AB (ref 8.9–10.3)
CHLORIDE: 91 mmol/L — AB (ref 101–111)
CHLORIDE: 92 mmol/L — AB (ref 101–111)
CO2: 35 mmol/L — ABNORMAL HIGH (ref 22–32)
CO2: 38 mmol/L — ABNORMAL HIGH (ref 22–32)
CREATININE: 1.04 mg/dL — AB (ref 0.44–1.00)
CREATININE: 0.98 mg/dL (ref 0.44–1.00)
GFR calc non Af Amer: >60 mL/min (ref >60)
GFR, EST NON AFRICAN AMERICAN: 56 mL/min — AB (ref >60)
Glucose, Bld: 314 mg/dL — ABNORMAL HIGH (ref 65–99)
Glucose, Bld: 267 mg/dL — ABNORMAL HIGH (ref 65–99)
Potassium: 4 mmol/L (ref 3.5–5.1)
Potassium: 3.7 mmol/L (ref 3.5–5.1)
SODIUM: 140 mmol/L (ref 135–145)
SODIUM: 140 mmol/L (ref 135–145)

## 2015-12-03 LAB — URINALYSIS, ROUTINE W REFLEX MICROSCOPIC
Glucose, UA: NEGATIVE mg/dL
HGB URINE DIPSTICK: NEGATIVE
Ketones, ur: 15 mg/dL — AB
Nitrite: NEGATIVE
PH: 6 (ref 5.0–8.0)
Protein, ur: 30 mg/dL — AB
SPECIFIC GRAVITY, URINE: 1.021 (ref 1.005–1.030)

## 2015-12-03 LAB — BLOOD CULTURE ID PANEL (REFLEXED)
ACINETOBACTER BAUMANNII: NOT DETECTED
CANDIDA ALBICANS: NOT DETECTED
CANDIDA GLABRATA: NOT DETECTED
CANDIDA KRUSEI: NOT DETECTED
CANDIDA PARAPSILOSIS: NOT DETECTED
CANDIDA TROPICALIS: NOT DETECTED
ENTEROBACTER CLOACAE COMPLEX: NOT DETECTED
ENTEROBACTERIACEAE SPECIES: NOT DETECTED
ESCHERICHIA COLI: NOT DETECTED
Enterococcus species: NOT DETECTED
Haemophilus influenzae: NOT DETECTED
KLEBSIELLA OXYTOCA: NOT DETECTED
KLEBSIELLA PNEUMONIAE: NOT DETECTED
Listeria monocytogenes: NOT DETECTED
Neisseria meningitidis: NOT DETECTED
PROTEUS SPECIES: NOT DETECTED
Pseudomonas aeruginosa: NOT DETECTED
STREPTOCOCCUS AGALACTIAE: NOT DETECTED
Serratia marcescens: NOT DETECTED
Staphylococcus aureus (BCID): NOT DETECTED
Staphylococcus species: NOT DETECTED
Streptococcus pneumoniae: NOT DETECTED
Streptococcus pyogenes: NOT DETECTED
Streptococcus species: NOT DETECTED

## 2015-12-03 LAB — CBC
HCT: 44 % (ref 36.0–46.0)
HEMOGLOBIN: 12.2 g/dL (ref 12.0–15.0)
MCH: 28.4 pg (ref 26.0–34.0)
MCHC: 27.7 g/dL — ABNORMAL LOW (ref 30.0–36.0)
MCV: 102.3 fL — AB (ref 78.0–100.0)
Platelets: 165 10*3/uL (ref 150–400)
RBC: 4.3 MIL/uL (ref 3.87–5.11)
RDW: 14.8 % (ref 11.5–15.5)
WBC: 8.6 10*3/uL (ref 4.0–10.5)

## 2015-12-03 LAB — HEPARIN LEVEL (UNFRACTIONATED): HEPARIN UNFRACTIONATED: 1.2 [IU]/mL — AB (ref 0.30–0.70)

## 2015-12-03 LAB — PHOSPHORUS
Phosphorus: <1 mg/dL — CL (ref 2.5–4.6)
Phosphorus: 1.3 mg/dL — ABNORMAL LOW (ref 2.5–4.6)
Phosphorus: 3.1 mg/dL (ref 2.5–4.6)

## 2015-12-03 LAB — GLUCOSE, CAPILLARY
GLUCOSE-CAPILLARY: 150 mg/dL — AB (ref 65–99)
GLUCOSE-CAPILLARY: 151 mg/dL — AB (ref 65–99)
GLUCOSE-CAPILLARY: 199 mg/dL — AB (ref 65–99)
GLUCOSE-CAPILLARY: 305 mg/dL — AB (ref 65–99)
Glucose-Capillary: 280 mg/dL — ABNORMAL HIGH (ref 65–99)
Glucose-Capillary: 208 mg/dL — ABNORMAL HIGH (ref 65–99)
Glucose-Capillary: 220 mg/dL — ABNORMAL HIGH (ref 65–99)

## 2015-12-03 LAB — MAGNESIUM
MAGNESIUM: 1.7 mg/dL (ref 1.7–2.4)
MAGNESIUM: 2.1 mg/dL (ref 1.7–2.4)
MAGNESIUM: 2 mg/dL (ref 1.7–2.4)

## 2015-12-03 LAB — RAPID URINE DRUG SCREEN, HOSP PERFORMED
Amphetamines: NOT DETECTED
Barbiturates: NOT DETECTED
Benzodiazepines: POSITIVE — AB
COCAINE: NOT DETECTED
OPIATES: POSITIVE — AB
TETRAHYDROCANNABINOL: NOT DETECTED

## 2015-12-03 LAB — URINE MICROSCOPIC-ADD ON: RBC / HPF: NONE SEEN RBC/hpf (ref 0–5)

## 2015-12-03 LAB — POCT I-STAT 3, ART BLOOD GAS (G3+)
ACID-BASE EXCESS: 12 mmol/L — AB (ref 0.0–2.0)
Bicarbonate: 39.8 mmol/L — ABNORMAL HIGH (ref 20.0–28.0)
O2 SAT: 100 %
PCO2 ART: 64.8 mmHg — AB (ref 32.0–48.0)
PH ART: 7.398 (ref 7.350–7.450)
Patient temperature: 99.7
TCO2: 42 mmol/L (ref 0–100)
pO2, Arterial: 222 mmHg — ABNORMAL HIGH (ref 83.0–108.0)

## 2015-12-03 LAB — LACTIC ACID, PLASMA
LACTIC ACID, VENOUS: 3.9 mmol/L — AB (ref 0.5–1.9)
Lactic Acid, Venous: 1.3 mmol/L (ref 0.5–1.9)

## 2015-12-03 LAB — MRSA PCR SCREENING: MRSA BY PCR: NEGATIVE

## 2015-12-03 LAB — APTT
APTT: 61 s — AB (ref 24–36)
aPTT: 39 seconds — ABNORMAL HIGH (ref 24–36)

## 2015-12-03 MED ORDER — FUROSEMIDE 10 MG/ML IJ SOLN
40.0000 mg | Freq: Three times a day (TID) | INTRAMUSCULAR | Status: AC
  Administered 2015-12-03 (×2): 40 mg via INTRAVENOUS
  Filled 2015-12-03 (×2): qty 4

## 2015-12-03 MED ORDER — ADULT MULTIVITAMIN LIQUID CH
15.0000 mL | Freq: Every day | ORAL | Status: DC
  Administered 2015-12-03 – 2015-12-04 (×2): 15 mL
  Filled 2015-12-03 (×5): qty 15

## 2015-12-03 MED ORDER — ORAL CARE MOUTH RINSE
15.0000 mL | Freq: Four times a day (QID) | OROMUCOSAL | Status: DC
  Administered 2015-12-03 – 2015-12-08 (×16): 15 mL via OROMUCOSAL

## 2015-12-03 MED ORDER — VITAL HIGH PROTEIN PO LIQD
1000.0000 mL | ORAL | Status: DC
  Administered 2015-12-03: 1000 mL
  Administered 2015-12-04 (×2)
  Administered 2015-12-04: 1000 mL
  Administered 2015-12-04 – 2015-12-05 (×5)
  Filled 2015-12-03: qty 1000

## 2015-12-03 MED ORDER — FENTANYL 25 MCG/HR TD PT72
25.0000 ug | MEDICATED_PATCH | TRANSDERMAL | Status: DC
  Administered 2015-12-03 – 2015-12-06 (×2): 25 ug via TRANSDERMAL
  Filled 2015-12-03 (×3): qty 1

## 2015-12-03 MED ORDER — CHLORHEXIDINE GLUCONATE 0.12% ORAL RINSE (MEDLINE KIT)
15.0000 mL | Freq: Two times a day (BID) | OROMUCOSAL | Status: DC
  Administered 2015-12-03 – 2015-12-08 (×10): 15 mL via OROMUCOSAL

## 2015-12-03 MED ORDER — MAGNESIUM SULFATE 2 GM/50ML IV SOLN
2.0000 g | Freq: Once | INTRAVENOUS | Status: AC
  Administered 2015-12-03: 2 g via INTRAVENOUS
  Filled 2015-12-03: qty 50

## 2015-12-03 MED ORDER — POTASSIUM PHOSPHATES 15 MMOLE/5ML IV SOLN
30.0000 mmol | Freq: Once | INTRAVENOUS | Status: AC
  Administered 2015-12-03: 30 mmol via INTRAVENOUS
  Filled 2015-12-03: qty 10

## 2015-12-03 MED ORDER — INSULIN GLARGINE 100 UNIT/ML ~~LOC~~ SOLN
5.0000 [IU] | Freq: Two times a day (BID) | SUBCUTANEOUS | Status: DC
  Administered 2015-12-03 – 2015-12-08 (×11): 5 [IU] via SUBCUTANEOUS
  Filled 2015-12-03 (×12): qty 0.05

## 2015-12-03 MED ORDER — PRO-STAT SUGAR FREE PO LIQD
60.0000 mL | Freq: Three times a day (TID) | ORAL | Status: DC
  Administered 2015-12-03 – 2015-12-04 (×6): 60 mL
  Filled 2015-12-03 (×10): qty 60

## 2015-12-03 MED ORDER — CLONAZEPAM 0.5 MG PO TBDP
0.5000 mg | ORAL_TABLET | Freq: Two times a day (BID) | ORAL | Status: DC
  Administered 2015-12-03 – 2015-12-07 (×8): 0.5 mg
  Filled 2015-12-03 (×9): qty 1

## 2015-12-03 MED ORDER — HEPARIN (PORCINE) IN NACL 100-0.45 UNIT/ML-% IJ SOLN
1350.0000 [IU]/h | INTRAMUSCULAR | Status: DC
  Administered 2015-12-04: 1350 [IU]/h via INTRAVENOUS
  Filled 2015-12-03 (×3): qty 250

## 2015-12-03 MED ORDER — SODIUM PHOSPHATES 45 MMOLE/15ML IV SOLN
30.0000 mmol | Freq: Once | INTRAVENOUS | Status: AC
  Administered 2015-12-03: 30 mmol via INTRAVENOUS
  Filled 2015-12-03: qty 10

## 2015-12-03 NOTE — Progress Notes (Signed)
[facility] Progress Note Patient Name: [name] DOB: August 13, 1955 MRN: [id number]   Date of Service  12/03/2015  HPI/Events of Note  hypophosphatemia  eICU Interventions  Phos replaced     Intervention Category Intermediate Interventions: Electrolyte abnormality - evaluation and management  DETERDING,ELIZABETH 12/03/2015, 6:01 AM

## 2015-12-03 NOTE — Progress Notes (Signed)
Patient transferred to 2M6 on 143% FiO2 w/o complications. Uneventful trip.

## 2015-12-03 NOTE — Progress Notes (Addendum)
Initial Nutrition Assessment  DOCUMENTATION CODES:   Obesity unspecified  INTERVENTION:   Vital High Protein @ 15 ml/hr (360 ml/day) 60 ml Prostat TID MVI daily Provides: 960 kcal, 121 grams protein, and 300 ml H2O TF regimen and propofol at current rate providing 1282 total kcal/day   Monitor magnesium and phosphorus every 12 hours x 4, MD to replete and continue to monitor as needed, as pt is at risk for refeeding syndrome given PO4 < 1.0.  NUTRITION DIAGNOSIS:   Increased nutrient needs related to  (stage III wound) as evidenced by estimated needs.  GOAL:   Provide needs based on ASPEN/SCCM guidelines  MONITOR:   TF tolerance, Skin, Labs, Vent status  REASON FOR ASSESSMENT:   Consult Enteral/tube feeding initiation and management  ASSESSMENT:   Pt with hx of COPD on 5 L O2 at home and hx of noncompliance admitted 10/26 with presumed AECOPD requiring intubation. 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 Sleeps in recliner at home and is incontinet.     Patient is currently intubated on ventilator support Usual weight had been around 230 lb unsure if she has had actual weight loss or if bed scale is different from previous scales. No family present unable to determine nutrition hx.  Pt is anxious during exam, wakes and starts to shake. RN in room and feels she has been doing this and is receiving klonopin.   TF will start today and will provide no more than 780 kcal and 80 grams carbohydrate over the next 12 hours, PO4 labs being monitored.   Propofol: 12.2 ml/hr provides 322 kcal per day from lipid Medications reviewed and include: lasix, lantus, novolog, magnesium sulfate, solu-medrol, Na phosphate, K+ phosphate Labs reviewed: PO4 <1.0 (being replaced) CBG's: 208-150 Nutrition-Focused physical exam completed. Findings are no fat depletion, no muscle depletion, and mild edema.  Per xray OG at GE junction with radiology  recommending advancement of tube   Diet Order:  Diet NPO time specified  Skin:  Wound (see comment) (stage III buttocks, MASD groin, cellulitis BLE)  Last BM:  10/27  Height:   Ht Readings from Last 1 Encounters:  12/03/15 '5\' 6"'$  (1.676 m)    Weight:   Wt Readings from Last 1 Encounters:  12/03/15 201 lb 4.5 oz (91.3 kg)    Ideal Body Weight:  59 kg  BMI:  Body mass index is 32.49 kg/m.  Estimated Nutritional Needs:   Kcal:  1779-3903  Protein:  >/= 118 grams  Fluid:  >1.5 L/day  EDUCATION NEEDS:   No education needs identified at this time  [place], [place], [place] Pager [phone] After Hours Pager

## 2015-12-03 NOTE — Progress Notes (Signed)
Inpatient Diabetes Program Recommendations  AACE/ADA: New Consensus Statement on Inpatient Glycemic Control (2015)  Target Ranges:  Prepandial:   less than 140 mg/dL      Peak postprandial:   less than 180 mg/dL (1-2 hours)      Critically ill patients:  140 - 180 mg/dL   Lab Results  Component Value Date   GLUCAP 150 (H) 12/03/2015   HGBA1C 9.2 (H) 12/16/2012    Review of Glycemic Control:  Results for SCOTTIE, STANISH (MRN [id number]) as of 12/03/2015 14:18  Ref. Range 12/03/2015 01:00 12/03/2015 04:04 12/03/2015 07:42 12/03/2015 11:13  Glucose-Capillary Latest Ref Range: 65 - 99 mg/dL 280 (H) 305 (H) 208 (H) 150 (H)   Diabetes history: Type 2 diabetes Outpatient Diabetes medications: Tresiba 20 units daily, Novolog 20 units tid with meals Current orders for Inpatient glycemic control:  Novolog resistant q 4 hours, Lantus 5 units bid  Inpatient Diabetes Program Recommendations:    Please consider ICU glycemic control order set.   Thanks, Adah Perl, RN, BC-ADM Inpatient Diabetes Coordinator Pager [phone] (8a-5p)

## 2015-12-03 NOTE — Consult Note (Addendum)
[place] Nurse wound consult note Reason for Consult: Consult requested for buttocks.  Family member states she is frequently incontinent of urine when at home and sleeps in a recliner.  They state she has several areas on her buttock which "heal up and get better, then come back again." Wound type: Patchy areas of partial thickness skin loss related to moisture associated skin damage.  Left buttock with irregular-shaped .2X.2X.1cm area, dry scabbed wound bed, no odor or drainage. Pressure Ulcer POA: This is NOT a pressure injury and was present on admission; appears to be related to moisture and shear. Dressing procedure/placement/frequency: Foam dressing to protect and promote healing. Discussed plan of care with family member at the bedside. Please re-consult if further assistance is needed.  Thank-you,  Julien Girt MSN, [place], [place], [place], [place]

## 2015-12-03 NOTE — Care Management Note (Signed)
Case Management Note  Patient Details  Name: [name] MRN: [id number] Date of Birth: [birth year]  Subjective/Objective:    Admitted with COPD excerbation      Action/Plan:  PTA from home with husband  - on chronic 5 L O2 at home.  Pt is now ventilated.  CM will continue to follow for discharge needs   Expected Discharge Date:                  Expected Discharge Plan:     In-House Referral:     Discharge planning Services  CM Consult  Post Acute Care Choice:    Choice offered to:     DME Arranged:    DME Agency:     HH Arranged:    HH Agency:     Status of Service:  In process, will continue to follow  If discussed at Long Length of Stay Meetings, dates discussed:    Additional Comments:  Maryclare Labrador, RN 12/03/2015, 3:02 PM

## 2015-12-03 NOTE — Progress Notes (Signed)
PULMONARY / CRITICAL CARE MEDICINE   Name: [name] MRN: [id number] DOB: [birth year]    ADMISSION DATE:  12/02/2015 CONSULTATION DATE:  12/02/15  REFERRING MD:  EDP  CHIEF COMPLAINT:  Shortness of breath   HISTORY OF PRESENT ILLNESS:   [name] is a 60 y.o. female with PMH as outlined below including known COPD (on chronic 5 L O2 and 24/7) as well as history of noncompliance with outpatient follow-up. She had hospitalization here at [facility] in April where she required intubation. She was instructed to follow-up with Dr. Elsworth Mills however she never did so. She was brought to [facility] ED 12/02/15 with SOB. [name] reports that SOB is ongoing on a daily basis however over the past 2 weeks it has been worse. Symptoms have exacerbated over the past 2-3 days. [name] states that she got to the point where she could barely breathe at all with minimal activity and this is what prompted them to call EMS.  In ED, she was in significant respiratory distress for which she was immediately intubated. She apparently has severe claustrophobia and has made it clear the past that she would never ever want BiPAP or CPAP. [name] states that she is so claustrophobic that she fears being buried after she passes due to being stuck underground.  She has not had any recent fevers/chills/sweats, headaches, chest pain, increasing wheezing, N/V/D, abdominal pain, myalgias, exposures to known sick contacts.  She is a former smoker, quit roughly 5 years ago.  [name] states that she only sees her doctor once or twice a year and that she doesn't like going to the doctor because she feels that she can barely breathe well enough to get there.  When asked why she has not followed up with pulmonary, [name] states that she is just very stubborn. Of note, patient's [name] states that "if she could just get pain pills and Xanax", she probably be much better off.  PAST MEDICAL HISTORY :  She  has a past medical history of Acute  MI; Arthritis; COPD (chronic obstructive pulmonary disease) ([place]); Coronary artery disease; Diabetes mellitus without complication ([place]); Shortness of breath; and Stroke ([place]) (TIA ).  PAST SURGICAL HISTORY: She  has a past surgical history that includes Abdominal hysterectomy; Cardiac catheterization; and Coronary artery bypass graft.  Allergies  Allergen Reactions  . Ciprofloxacin Rash    REACTION: hives/rash    No current facility-administered medications on file prior to encounter.    Current Outpatient Prescriptions on File Prior to Encounter  Medication Sig  . albuterol (PROVENTIL) (2.5 MG/3ML) 0.083% nebulizer solution Take 3 mLs (2.5 mg total) by nebulization every 3 (three) hours as needed for wheezing.  Marland Kitchen atorvastatin (LIPITOR) 20 MG tablet Take 1 tablet (20 mg total) by mouth daily.  . budesonide (PULMICORT) 0.5 MG/2ML nebulizer solution Take 0.5 mg by nebulization 2 (two) times daily as needed (FOR SOB).   . insulin aspart (NOVOLOG FLEXPEN) 100 UNIT/ML SOPN FlexPen Inject 6 Units into the skin 3 (three) times daily with meals. (Patient taking differently: Inject 20 Units into the skin 3 (three) times daily with meals. )  . Insulin Degludec (TRESIBA FLEXTOUCH) 100 UNIT/ML SOPN Inject 20 Units into the skin daily.  . Ipratropium-Albuterol (COMBIVENT RESPIMAT) 20-100 MCG/ACT AERS respimat Inhale 1-2 puffs into the lungs every 4 (four) hours as needed for wheezing.  . isosorbide mononitrate (IMDUR) 60 MG 24 hr tablet Take 1 tablet (60 mg total) by mouth daily.  . metoprolol tartrate (LOPRESSOR) 50 MG tablet  Take 1 tablet (50 mg total) by mouth 2 (two) times daily.  . Morphine Sulfate (MORPHINE CONCENTRATE) 10 MG/0.5ML SOLN concentrated solution Take 0.25 mLs (5 mg total) by mouth every 3 (three) hours as needed for shortness of breath (dyspnea).  . predniSONE (DELTASONE) 10 MG tablet 40 mg x 5 days, 30 mg x 5 days, 20 mg x 5 days, 10 mg x 5 days then stop  . Rivaroxaban (XARELTO) 20  MG TABS tablet Take 1 tablet (20 mg total) by mouth daily.  . pantoprazole (PROTONIX) 40 MG tablet Take 1 tablet (40 mg total) by mouth daily at 12 noon. (Patient not taking: Reported on 12/02/2015)    FAMILY HISTORY:  Her    SOCIAL HISTORY: She  reports that she quit smoking about 6 years ago. Her smoking use included Cigarettes. She has a 120.00 pack-year smoking history. She has never used smokeless tobacco. She reports that she does not drink alcohol.  REVIEW OF SYSTEMS:   Unable to obtain due to sedation   SUBJECTIVE:  No acute events overnight. Was febrile to 100.7 this AM. Has been tachycardic in the 1-teens. EKG last night with NSR.  MAPs were in the 50-60s early this AM, but currently stable.   VITAL SIGNS: BP 119/62   Pulse (!) 118   Temp (!) 100.7 F (38.2 C) (Oral) Comment: Notified RN- Kristina   Resp (!) 24   Ht '5\' 10"'$  (1.778 m)   Wt 91.3 kg (201 lb 4.5 oz)   SpO2 99%   BMI 28.88 kg/m   HEMODYNAMICS:    VENTILATOR SETTINGS: Vent Mode: PRVC FiO2 (%):  [50 %-80 %] 50 % Set Rate:  [24 bmp] 24 bmp Vt Set:  [480 mL] 480 mL PEEP:  [5 cmH20] 5 cmH20 Plateau Pressure:  [15 cmH20-22 cmH20] 15 cmH20  INTAKE / OUTPUT: I/O last 3 completed shifts: In: 886.7 [I.V.:886.7] Out: 175 [Urine:175]  PHYSICAL EXAMINATION: GEN: NAD, sedated but opens eyes spontaneously  HEENT: PERRL  CV: tachycardia, regular rhythm, no murmurs, rubs, or gallops PULM: CTAB, normal effort ABD: Soft, nontender, nondistended, NABS, no organomegaly SKIN: No rash or cyanosis; warm and well-perfused EXTR: No lower extremity edema or calf tenderness  LABS:  BMET  Recent Labs Lab 12/02/15 2116 12/03/15 0140 12/03/15 0454  NA 142 140 140  K 5.6* 4.0 3.7  CL 89* 91* 92*  CO2 42* 35* 38*  BUN 20 23* 23*  CREATININE 0.73 1.04* 0.98  GLUCOSE 159* 314* 267*    Electrolytes  Recent Labs Lab 12/02/15 2116 12/03/15 0140 12/03/15 0454  CALCIUM 9.5 9.0 8.7*  MG  --   --  1.7  PHOS   --   --  <1.0*    CBC  Recent Labs Lab 12/02/15 2116 12/03/15 0454  WBC 9.9 8.6  HGB 14.0 12.2  HCT 51.2* 44.0  PLT 190 165    Coag's  Recent Labs Lab 12/03/15 0140  APTT 39*    Sepsis Markers  Recent Labs Lab 12/02/15 2127 12/03/15 0140  LATICACIDVEN 2.40* 3.9*    ABG  Recent Labs Lab 12/02/15 2339 12/03/15 0348  PHART 7.357 7.398  PCO2ART 85.2* 64.8*  PO2ART 43.0* 222.0*    Liver Enzymes  Recent Labs Lab 12/02/15 2116  AST 34  ALT 17  ALKPHOS 75  BILITOT 1.8*  ALBUMIN 3.3*    Cardiac Enzymes  Recent Labs Lab 12/02/15 2116  TROPONINI <0.03    Glucose  Recent Labs Lab 12/02/15 2124 12/03/15 0100 12/03/15  0404 12/03/15 0742  GLUCAP 161* 280* 305* 208*    Imaging Dg Chest Port 1 View  Result Date: 12/03/2015 CLINICAL DATA:  60 year old female with oral gastric tube placement. EXAM: PORTABLE ABDOMEN - 1 VIEW; PORTABLE CHEST - 1 VIEW COMPARISON:  Chest radiograph dated 12/02/2015 FINDINGS: Endotracheal tube with tip approximately 4 cm above the carina in stable positioning. There has been interval placement of an enteric tube with sideport in the distal third of the esophagus and the tip at the level of the diaphragm and gastroesophageal junction. Recommend advancing of the tube into the stomach. There is emphysematous changes of the lungs with interstitial coarsening. No focal consolidation, pleural effusion, or pneumothorax. Mild cardiomegaly. Median sternotomy wires and CABG vascular clips noted. No acute osseous pathology. There is moderate stool in the visualized portions of the colon. Air-filled loops of bowel in the mid abdomen are not well evaluated. IMPRESSION: Interval placement of an endotracheal tube with tip at the gastroesophageal junction. Recommend advancing the tube into the stomach. Electronically Signed   By: Anner Crete [name]   On: 12/03/2015 05:44   Dg Chest Portable 1 View  Result Date: 12/02/2015 CLINICAL DATA:   60 y/o  F; endotracheal tube. EXAM: PORTABLE CHEST 1 VIEW COMPARISON:  05/25/2015 chest radiograph FINDINGS: Normal cardiac silhouette given projection and technique. Post median sternotomy with wires and alignment. Endotracheal tube is 3.4 cm from the carina. Clear lungs. No pneumothorax. No pleural effusion. Post CABG. Bones are unremarkable. IMPRESSION: No active disease. Endotracheal tube approximately 3.4 cm from the carina. Electronically Signed   By: Kristine Garbe [name]   On: 12/02/2015 22:23   Dg Abd Portable 1v  Result Date: 12/03/2015 CLINICAL DATA:  60 year old female with oral gastric tube placement. EXAM: PORTABLE ABDOMEN - 1 VIEW; PORTABLE CHEST - 1 VIEW COMPARISON:  Chest radiograph dated 12/02/2015 FINDINGS: Endotracheal tube with tip approximately 4 cm above the carina in stable positioning. There has been interval placement of an enteric tube with sideport in the distal third of the esophagus and the tip at the level of the diaphragm and gastroesophageal junction. Recommend advancing of the tube into the stomach. There is emphysematous changes of the lungs with interstitial coarsening. No focal consolidation, pleural effusion, or pneumothorax. Mild cardiomegaly. Median sternotomy wires and CABG vascular clips noted. No acute osseous pathology. There is moderate stool in the visualized portions of the colon. Air-filled loops of bowel in the mid abdomen are not well evaluated. IMPRESSION: Interval placement of an endotracheal tube with tip at the gastroesophageal junction. Recommend advancing the tube into the stomach. Electronically Signed   By: Anner Crete [name]   On: 12/03/2015 05:44     STUDIES:  CXR 10/26 > no active process. CXR 10/27> no consolidation; enteric tube with sideport in the distal third of the esophagus and the tip at the level of the diaphragm and gastroesophageal junction  CULTURES: MRSA PCR Negative Blood 10/26 >  Blood 10/27 > Sputum 10/26 >   Urine Culture 10/27 >  ANTIBIOTICS: Ceftriaxone 10/26 >  Azithromycin 10/27 x 1   SIGNIFICANT EVENTS: 10/26 > admitted with presumed AECOPD requiring intubation.  LINES/TUBES: ETT 10/26 >  PIV x3  DISCUSSION: 60 y.o. female with history of COPD and noncompliance, admitted 10/26 with presumed AECOPD requiring intubation.  ASSESSMENT / PLAN:  PULMONARY A: Acute on chronic hypoxic and hypercarbic respiratory failure. COPD per report - no PFTs available. Hx intolerance to NIMV due to claustrophobia, noncompliance with outpatient follow-up. P:  Full vent support. Wean as able. VAP prevention measures. SBT in AM if able. Budesonide/Brovana, DuoNeb's. Solu-Medrol '40mg'$  daily, Ceftriaxone. CXR in AM.  CARDIOVASCULAR A:  Hx CAD. Tachycardia Intermittent Hypotension: possibly due to sedation medication P:  Continue preadmission atorvastatin. Hold preadmission Imdur.  RENAL A:   Mild hyperkalemia, resolved Hypophosphatemia: < 1 Lactic Acidosis: 2.4 > 3.9 Mild AKI: Creatinine improving (basline 0.5-0.6). Likely pre-renal  P:   NS @ 75. BMP q4hrs x 2. Phos repleted Follow Lactic acid    GASTROINTESTINAL A:   Obesity. GI prophylaxis. Nutrition. P:   SUP: Pantoprazole. NPO.  HEMATOLOGIC A:   On chronic Xarelto - reportedly due to TIAs (? Unclear why on Xarelto?). VTE Prophylaxis. P:  Hold preadmission Xarelto. SCD's / heparin drip.  INFECTIOUS A:   AECOPD Possible UTI: UA with large LE P:   Abx as above (ceftriaxone).  Follow cultures as above.  ENDOCRINE A:   DM.   P:   SSI. Hold preadmission NovoLog, tresiba.  NEUROLOGIC A:   Acute encephalopathy. Hx TIA, arthritis. ? Concern for substance abuse - patient's [name] stated that if patient could get pain pills and Xanax that she would be much better off. P:   Sedation:  Propofol drip/fentanyl drip. PRN Fentanyl Consider switching to Precedex?  RASS goal: 0 to -1. Daily  WUA. Assess UDS. Hold preadmission gabapentin, morphine.    Interdisciplinary Family Meeting v Palliative Care Meeting:  Due by: 11/2.  Smiley Houseman, MD PGY 2 Family Medicine 12/03/2015, 7:51 AM  Rush Farmer, [name] [facility] Pulmonary/Critical Care Medicine. Pager: [phone]. After hours pager: [phone].

## 2015-12-03 NOTE — Progress Notes (Signed)
ANTICOAGULATION CONSULT NOTE - Initial Consult  Pharmacy Consult for heparin Indication: Atherosclerosis of coronary artery of native valve  Allergies  Allergen Reactions  . Ciprofloxacin Rash    REACTION: hives/rash    Patient Measurements: Height: '5\' 10"'$  (177.8 cm) Weight: 201 lb 4.5 oz (91.3 kg) IBW/kg (Calculated) : 68.5 Heparin Dosing Weight: 73kg  Vital Signs: Temp: 100.7 F (38.2 C) (10/27 0745) Temp Source: Oral (10/27 0745) BP: 104/54 (10/27 1000) Pulse Rate: 122 (10/27 1000)  Labs:  Recent Labs  12/02/15 2116 12/03/15 0140 12/03/15 0454  HGB 14.0  --  12.2  HCT 51.2*  --  44.0  PLT 190  --  165  APTT  --  39*  --   HEPARINUNFRC  --  >2.20*  --   CREATININE 0.73 1.04* 0.98  TROPONINI <0.03  --   --     Estimated Creatinine Clearance: 72.9 mL/min (by C-G formula based on SCr of 0.98 mg/dL).   Assessment: 83 YOF on Xarelto PTA- per CareEverywhere notes, she is on for atherosclerosis of coronary artery of native valve and angina. Last dose was 10/26 ~1400. She is to start heparin while she cannot take Xarelto due to intubation. CrCl is >70m/min, therefore we will not start heparin until 24h after last dose. Baseline heparin level >2.2 and aPTT 39. Hgb 12.2, plts 165,  no S/Sx bleeding noted.   Goal of Therapy:  Heparin level 0.3-0.7 units/ml Monitor platelets by anticoagulation protocol: Yes   Plan:  -Start heparin without bolus at rate of 1050 units/hr on 10/27 at 1400 -Heparin level and aPTT 6h after heparin started (at 2000 on 10/27) -Follow daily heparin level and aPTT until correlating -Monitor daily CBC, S/Sx bleeding  MArrie Senate PharmD PGY-1 Pharmacy Resident Pager: [phone]/27/2017

## 2015-12-03 NOTE — Progress Notes (Signed)
PULMONARY / CRITICAL CARE MEDICINE   Name: [name] MRN: [id number] DOB: 1955/10/12    ADMISSION DATE:  12/02/2015 CONSULTATION DATE:  12/02/15  REFERRING MD:  Regenia Skeeter - EDP  CHIEF COMPLAINT:  SOB  HISTORY OF PRESENT ILLNESS:  Pt is encephelopathic; therefore, this HPI is obtained from chart review. [name] is a 60 y.o. female with PMH as outlined below including known COPD (on chronic 5 L O2 and 24/7) as well as history of noncompliance with outpatient follow-up. She had hospitalization here at [facility] in April where she required intubation. She was instructed to follow-up with Dr. Elsworth Soho however she never did so. She was brought to [facility] ED 12/02/15 with SOB. Husband reports that SOB is ongoing on a daily basis however over the past 2 weeks it has been worse. Symptoms have exacerbated over the past 2-3 days. Husband states that she got to the point where she could barely breathe at all with minimal activity and this is what prompted them to call EMS.  In ED, she was in significant respiratory distress for which she was immediately intubated. She apparently has severe claustrophobia and has made it clear the past that she would never ever want BiPAP or CPAP. Husband states that she is so claustrophobic that she fears being buried after she passes due to being stuck underground.  She has not had any recent fevers/chills/sweats, headaches, chest pain, increasing wheezing, N/V/D, abdominal pain, myalgias, exposures to known sick contacts.  She is a former smoker, quit roughly 5 years ago.  Husband states that she only sees her doctor once or twice a year and that she doesn't like going to the doctor because she feels that she can barely breathe well enough to get there.  When asked why she has not followed up with pulmonary, husband states that she is just very stubborn. Of note, patient's son states that "if she could just get pain pills and Xanax", she probably be much better  off.  SUBJECTIVE:  On vent, agitated.  VITAL SIGNS: BP (!) 85/53   Pulse (!) 111   Temp (!) 100.7 F (38.2 C) (Oral) Comment: Notified RN- Kristina   Resp (!) 24   Ht '5\' 10"'$  (1.778 m)   Wt 91.3 kg (201 lb 4.5 oz)   SpO2 97%   BMI 28.88 kg/m   HEMODYNAMICS:    VENTILATOR SETTINGS: Vent Mode: PRVC FiO2 (%):  [50 %-80 %] 50 % Set Rate:  [24 bmp] 24 bmp Vt Set:  [480 mL] 480 mL PEEP:  [5 cmH20] 5 cmH20 Plateau Pressure:  [15 cmH20-22 cmH20] 17 cmH20  INTAKE / OUTPUT: I/O last 3 completed shifts: In: 1012.8 [I.V.:969.8; IV Piggyback:43] Out: 175 [Urine:175]  PHYSICAL EXAMINATION: General: Chronically ill-appearing female, agitated on vent Neuro: Agitated on vent, moving all extremities. HEENT: [place]/AT. PERRL, sclerae anicteric. Cardiovascular: RRR, no M/R/G.  Lungs: Respirations even and unlabored.  Breath sounds distant, faint bilateral basilar wheeze. Abdomen: Obese, BS x 4, soft, NT/ND.  Musculoskeletal: No gross deformities, no edema.  Skin: Intact, warm, no rashes.  LABS:  BMET  Recent Labs Lab 12/02/15 2116 12/03/15 0140 12/03/15 0454  NA 142 140 140  K 5.6* 4.0 3.7  CL 89* 91* 92*  CO2 42* 35* 38*  BUN 20 23* 23*  CREATININE 0.73 1.04* 0.98  GLUCOSE 159* 314* 267*   Electrolytes  Recent Labs Lab 12/02/15 2116 12/03/15 0140 12/03/15 0454  CALCIUM 9.5 9.0 8.7*  MG  --   --  1.7  PHOS  --   --  <1.0*   CBC  Recent Labs Lab 12/02/15 2116 12/03/15 0454  WBC 9.9 8.6  HGB 14.0 12.2  HCT 51.2* 44.0  PLT 190 165   Coag's  Recent Labs Lab 12/03/15 0140  APTT 39*   Sepsis Markers  Recent Labs Lab 12/02/15 2127 12/03/15 0140  LATICACIDVEN 2.40* 3.9*   ABG  Recent Labs Lab 12/02/15 2339 12/03/15 0348  PHART 7.357 7.398  PCO2ART 85.2* 64.8*  PO2ART 43.0* 222.0*   Liver Enzymes  Recent Labs Lab 12/02/15 2116  AST 34  ALT 17  ALKPHOS 75  BILITOT 1.8*  ALBUMIN 3.3*   Cardiac Enzymes  Recent Labs Lab 12/02/15 2116   TROPONINI <0.03   Glucose  Recent Labs Lab 12/02/15 2124 12/03/15 0100 12/03/15 0404 12/03/15 0742  GLUCAP 161* 280* 305* 208*   Imaging Dg Chest Port 1 View  Result Date: 12/03/2015 CLINICAL DATA:  60 year old female with oral gastric tube placement. EXAM: PORTABLE ABDOMEN - 1 VIEW; PORTABLE CHEST - 1 VIEW COMPARISON:  Chest radiograph dated 12/02/2015 FINDINGS: Endotracheal tube with tip approximately 4 cm above the carina in stable positioning. There has been interval placement of an enteric tube with sideport in the distal third of the esophagus and the tip at the level of the diaphragm and gastroesophageal junction. Recommend advancing of the tube into the stomach. There is emphysematous changes of the lungs with interstitial coarsening. No focal consolidation, pleural effusion, or pneumothorax. Mild cardiomegaly. Median sternotomy wires and CABG vascular clips noted. No acute osseous pathology. There is moderate stool in the visualized portions of the colon. Air-filled loops of bowel in the mid abdomen are not well evaluated. IMPRESSION: Interval placement of an endotracheal tube with tip at the gastroesophageal junction. Recommend advancing the tube into the stomach. Electronically Signed   By: Anner Crete [name]   On: 12/03/2015 05:44   Dg Chest Portable 1 View  Result Date: 12/02/2015 CLINICAL DATA:  60 y/o  F; endotracheal tube. EXAM: PORTABLE CHEST 1 VIEW COMPARISON:  05/25/2015 chest radiograph FINDINGS: Normal cardiac silhouette given projection and technique. Post median sternotomy with wires and alignment. Endotracheal tube is 3.4 cm from the carina. Clear lungs. No pneumothorax. No pleural effusion. Post CABG. Bones are unremarkable. IMPRESSION: No active disease. Endotracheal tube approximately 3.4 cm from the carina. Electronically Signed   By: Kristine Garbe [name]   On: 12/02/2015 22:23   Dg Abd Portable 1v  Result Date: 12/03/2015 CLINICAL DATA:   60 year old female with oral gastric tube placement. EXAM: PORTABLE ABDOMEN - 1 VIEW; PORTABLE CHEST - 1 VIEW COMPARISON:  Chest radiograph dated 12/02/2015 FINDINGS: Endotracheal tube with tip approximately 4 cm above the carina in stable positioning. There has been interval placement of an enteric tube with sideport in the distal third of the esophagus and the tip at the level of the diaphragm and gastroesophageal junction. Recommend advancing of the tube into the stomach. There is emphysematous changes of the lungs with interstitial coarsening. No focal consolidation, pleural effusion, or pneumothorax. Mild cardiomegaly. Median sternotomy wires and CABG vascular clips noted. No acute osseous pathology. There is moderate stool in the visualized portions of the colon. Air-filled loops of bowel in the mid abdomen are not well evaluated. IMPRESSION: Interval placement of an endotracheal tube with tip at the gastroesophageal junction. Recommend advancing the tube into the stomach. Electronically Signed   By: Anner Crete [name]   On: 12/03/2015 05:44   STUDIES:  CXR 10/26 > no active process.  CULTURES: Blood 10/26 >  Sputum 10/26 >   ANTIBIOTICS: Ceftriaxone 10/26 >   SIGNIFICANT EVENTS: 10/26 > admitted with presumed AECOPD requiring intubation.  LINES/TUBES: ETT 10/26 >   DISCUSSION: 60 y.o. female with history of COPD and noncompliance, admitted 10/26 with presumed AECOPD requiring intubation.  ASSESSMENT / PLAN:  PULMONARY A: Acute on chronic hypoxic and hypercarbic respiratory failure. COPD per report - no PFTs available. Hx intolerance to NIMV due to claustrophobia, noncompliance with outpatient follow-up. P:   Full vent support. Wean as able. VAP prevention measures. SBT in AM if able. Budesonide/Brovana, DuoNeb's. Solu-Medrol, Ceftriaxone. CXR in AM. Add home morphine and add clonazepam  CARDIOVASCULAR A:  Hx CAD. P:  Continue preadmission atorvastatin. Hold  preadmission Imdur. Tele monitoring  RENAL A:   Mild hyperkalemia. P:   KVO IVF   GASTROINTESTINAL A:   Obesity. GI prophylaxis. Nutrition. P:   SUP: Pantoprazole. NPO. Consult nutrition for TF as per nutrition  HEMATOLOGIC A:   On chronic Xarelto - reportedly due to TIAs (? Unclear why on Xarelto?). VTE Prophylaxis. P:  Hold preadmission Xarelto. SCD's / heparin drip. CBC in AM.  INFECTIOUS A:   AECOPD P:   Abx as above (ceftriaxone).  Follow cultures as above.  ENDOCRINE A:   DM.   P:   SSI. Hold preadmission NovoLog, tresiba. Lantus 5 BID  NEUROLOGIC A:   Acute encephalopathy. Hx TIA, arthritis. ? Concern for substance abuse - patient's son stated that if patient could get pain pills and Xanax that she would be much better off. P:   Sedation:  Propofol drip/fentanyl drip. RASS goal: 0 to -1. Daily WUA. Hold preadmission gabapentin, morphine. Clonazepam 1 mg BID and Fentanyl patch 25 mcg  Family updated: Husband updated bedside  Interdisciplinary Family Meeting v Palliative Care Meeting:  Due by: 11/2.  The patient is critically ill with multiple organ systems failure and requires high complexity decision making for assessment and support, frequent evaluation and titration of therapies, application of advanced monitoring technologies and extensive interpretation of multiple databases.   Critical Care Time devoted to patient care services described in this note is  35  Minutes. This time reflects time of care of this signee Dr Jennet Maduro. This critical care time does not reflect procedure time, or teaching time or supervisory time of PA/NP/Med student/Med Resident etc but could involve care discussion time.  Rush Farmer, [name] [facility] Pulmonary/Critical Care Medicine. Pager: [phone]. After hours pager: [phone].

## 2015-12-03 NOTE — Progress Notes (Signed)
ANTICOAGULATION CONSULT NOTE - FOLLOW UP    APTT = 61 (goal 66 - 102 sec) Heparin dosing weight = 73 kg   Assessment: 57 YOF with history of atherosclerosis of the coronary artery/native valve to continue on IV heparin while PTA Xarelto is on hold.  Currently using aPTT to guide heparin dosing since Xarelto could falsely elevate heparin levels.  APTT slightly sub-therapeutic; no bleeding reported.   Plan: - Increase heparin gtt to 1200 units/hr - F/U AM labs    Payton Prinsen D. Mina Marble, PharmD, BCPS 12/03/2015, 8:59 PM

## 2015-12-03 NOTE — Progress Notes (Signed)
PHARMACY - PHYSICIAN COMMUNICATION CRITICAL VALUE ALERT - BLOOD CULTURE IDENTIFICATION (BCID)  Results for orders placed or performed during the hospital encounter of 12/02/15  Blood Culture ID Panel (Reflexed) (Collected: 12/02/2015  9:15 PM)  Result Value Ref Range   Enterococcus species NOT DETECTED NOT DETECTED   Listeria monocytogenes NOT DETECTED NOT DETECTED   Staphylococcus species NOT DETECTED NOT DETECTED   Staphylococcus aureus NOT DETECTED NOT DETECTED   Streptococcus species NOT DETECTED NOT DETECTED   Streptococcus agalactiae NOT DETECTED NOT DETECTED   Streptococcus pneumoniae NOT DETECTED NOT DETECTED   Streptococcus pyogenes NOT DETECTED NOT DETECTED   Acinetobacter baumannii NOT DETECTED NOT DETECTED   Enterobacteriaceae species NOT DETECTED NOT DETECTED   Enterobacter cloacae complex NOT DETECTED NOT DETECTED   Escherichia coli NOT DETECTED NOT DETECTED   Klebsiella oxytoca NOT DETECTED NOT DETECTED   Klebsiella pneumoniae NOT DETECTED NOT DETECTED   Proteus species NOT DETECTED NOT DETECTED   Serratia marcescens NOT DETECTED NOT DETECTED   Haemophilus influenzae NOT DETECTED NOT DETECTED   Neisseria meningitidis NOT DETECTED NOT DETECTED   Pseudomonas aeruginosa NOT DETECTED NOT DETECTED   Candida albicans NOT DETECTED NOT DETECTED   Candida glabrata NOT DETECTED NOT DETECTED   Candida krusei NOT DETECTED NOT DETECTED   Candida parapsilosis NOT DETECTED NOT DETECTED   Candida tropicalis NOT DETECTED NOT DETECTED    Name of physician (or Provider) Contacted:  Dr. Jimmy Footman  Changes to prescribed antibiotics required: no change   Madline Oesterling D. Mina Marble, PharmD, BCPS Pager:  [phone] 12/03/2015, 9:44 PM

## 2015-12-04 ENCOUNTER — Inpatient Hospital Stay (HOSPITAL_COMMUNITY): Payer: 59

## 2015-12-04 DIAGNOSIS — L8990 Pressure ulcer of unspecified site, unspecified stage: Secondary | ICD-10-CM | POA: Insufficient documentation

## 2015-12-04 DIAGNOSIS — I509 Heart failure, unspecified: Secondary | ICD-10-CM

## 2015-12-04 DIAGNOSIS — R451 Restlessness and agitation: Secondary | ICD-10-CM

## 2015-12-04 LAB — URINE CULTURE: CULTURE: NO GROWTH

## 2015-12-04 LAB — GLUCOSE, CAPILLARY
GLUCOSE-CAPILLARY: 171 mg/dL — AB (ref 65–99)
GLUCOSE-CAPILLARY: 183 mg/dL — AB (ref 65–99)
GLUCOSE-CAPILLARY: 198 mg/dL — AB (ref 65–99)
GLUCOSE-CAPILLARY: 204 mg/dL — AB (ref 65–99)
GLUCOSE-CAPILLARY: 221 mg/dL — AB (ref 65–99)
Glucose-Capillary: 262 mg/dL — ABNORMAL HIGH (ref 65–99)

## 2015-12-04 LAB — MAGNESIUM
Magnesium: 2.1 mg/dL (ref 1.7–2.4)
Magnesium: 2.1 mg/dL (ref 1.7–2.4)

## 2015-12-04 LAB — APTT: aPTT: 67 seconds — ABNORMAL HIGH (ref 24–36)

## 2015-12-04 LAB — BASIC METABOLIC PANEL
Anion gap: 11 (ref 5–15)
BUN: 26 mg/dL — AB (ref 6–20)
CHLORIDE: 88 mmol/L — AB (ref 101–111)
CO2: 41 mmol/L — ABNORMAL HIGH (ref 22–32)
CREATININE: 0.7 mg/dL (ref 0.44–1.00)
Calcium: 8.5 mg/dL — ABNORMAL LOW (ref 8.9–10.3)
GFR calc Af Amer: >60 mL/min (ref >60)
GFR calc non Af Amer: >60 mL/min (ref >60)
GLUCOSE: 231 mg/dL — AB (ref 65–99)
POTASSIUM: 3.7 mmol/L (ref 3.5–5.1)
Sodium: 140 mmol/L (ref 135–145)

## 2015-12-04 LAB — HEPARIN LEVEL (UNFRACTIONATED)
Heparin Unfractionated: 0.24 IU/mL — ABNORMAL LOW (ref 0.30–0.70)
Heparin Unfractionated: 1 IU/mL — ABNORMAL HIGH (ref 0.30–0.70)
Heparin Unfractionated: 0.61 IU/mL (ref 0.30–0.70)

## 2015-12-04 LAB — PHOSPHORUS
Phosphorus: 2.9 mg/dL (ref 2.5–4.6)
Phosphorus: 3.5 mg/dL (ref 2.5–4.6)

## 2015-12-04 LAB — CBC
HCT: 41.5 % (ref 36.0–46.0)
HEMOGLOBIN: 12 g/dL (ref 12.0–15.0)
MCH: 27.8 pg (ref 26.0–34.0)
MCHC: 28.9 g/dL — AB (ref 30.0–36.0)
MCV: 96.3 fL (ref 78.0–100.0)
Platelets: 148 10*3/uL — ABNORMAL LOW (ref 150–400)
RBC: 4.31 MIL/uL (ref 3.87–5.11)
RDW: 15.1 % (ref 11.5–15.5)
WBC: 12.8 10*3/uL — ABNORMAL HIGH (ref 4.0–10.5)

## 2015-12-04 MED ORDER — POTASSIUM CHLORIDE 20 MEQ/15ML (10%) PO SOLN
40.0000 meq | Freq: Three times a day (TID) | ORAL | Status: AC
  Administered 2015-12-04 (×2): 40 meq
  Filled 2015-12-04 (×2): qty 30

## 2015-12-04 MED ORDER — MIDAZOLAM HCL 2 MG/2ML IJ SOLN
0.5000 mg | INTRAMUSCULAR | Status: AC | PRN
  Administered 2015-12-04 – 2015-12-05 (×3): 0.5 mg via INTRAVENOUS
  Filled 2015-12-04 (×3): qty 2

## 2015-12-04 MED ORDER — HEPARIN (PORCINE) IN NACL 100-0.45 UNIT/ML-% IJ SOLN
1200.0000 [IU]/h | INTRAMUSCULAR | Status: DC
  Administered 2015-12-04 – 2015-12-05 (×2): 1250 [IU]/h via INTRAVENOUS
  Administered 2015-12-06: 1200 [IU]/h via INTRAVENOUS
  Filled 2015-12-04 (×4): qty 250

## 2015-12-04 MED ORDER — METOLAZONE 5 MG PO TABS
5.0000 mg | ORAL_TABLET | Freq: Every day | ORAL | Status: AC
  Administered 2015-12-04: 5 mg via ORAL
  Filled 2015-12-04: qty 1

## 2015-12-04 MED ORDER — FUROSEMIDE 10 MG/ML IJ SOLN
40.0000 mg | Freq: Four times a day (QID) | INTRAMUSCULAR | Status: AC
  Administered 2015-12-04 – 2015-12-05 (×3): 40 mg via INTRAVENOUS
  Filled 2015-12-04 (×4): qty 4

## 2015-12-04 NOTE — Progress Notes (Signed)
ANTICOAGULATION CONSULT NOTE - Follow Up Consult  Pharmacy Consult for heparin Indication: Atherosclerosis of coronary artery of native valve  Allergies  Allergen Reactions  . Ciprofloxacin Rash    REACTION: hives/rash    Patient Measurements: Height: '5\' 6"'$  (167.6 cm) (per chart review) Weight: 200 lb 6.4 oz (90.9 kg) IBW/kg (Calculated) : 59.3 Heparin Dosing Weight: 73 kg  Vital Signs: Temp: 100 F (37.8 C) (10/28 1206) Temp Source: Oral (10/28 1206) BP: 100/59 (10/28 1500) Pulse Rate: 92 (10/28 1500)  Labs:  Recent Labs  12/02/15 2116  12/03/15 0140 12/03/15 0454 12/03/15 2019 12/04/15 0520 12/04/15 1417  HGB 14.0  --   --  12.2  --  12.0  --   HCT 51.2*  --   --  44.0  --  41.5  --   PLT 190  --   --  165  --  148*  --   APTT  --   --  39*  --  61* 67*  --   HEPARINUNFRC  --   < > >2.20*  --  1.20* 0.24* 1.00*  CREATININE 0.73  --  1.04* 0.98  --  0.70  --   TROPONINI <0.03  --   --   --   --   --   --   < > = values in this interval not displayed.  Estimated Creatinine Clearance: 82.8 mL/min (by C-G formula based on SCr of 0.7 mg/dL).  Assessment: 74 YOF with history of atherosclerosis of the coronary artery/native valve to continue on IV heparin while PTA Xarelto is on hold. Was dosing with aPTT due to Xarelto falsely elevating heparin levels but aPTT/Heparin levels are now correlating. HL is now unexpectedly elevated after a rate increase this morning. RN not aware of any issues with the lab draw and no bleeding noted.    Goal of Therapy:  Heparin level 0.3-0.7 units/ml Monitor platelets by anticoagulation protocol: Yes   Plan:  Hold heparin x 30 minutes then resume at 1250 units/hr Check an 8 hr heparin level Daily heparin level and CBC  Salome Arnt, PharmD, BCPS Pager # [phone] 12/04/2015 3:07 PM

## 2015-12-04 NOTE — Progress Notes (Signed)
Pt taken off wean by RN per MD request at 1430.

## 2015-12-04 NOTE — Progress Notes (Signed)
PULMONARY / CRITICAL CARE MEDICINE   Name: [name] MRN: [id number] DOB: 1955/05/02    ADMISSION DATE:  12/02/2015 CONSULTATION DATE:  12/02/15  REFERRING MD:  Regenia Skeeter - EDP  CHIEF COMPLAINT:  SOB  HISTORY OF PRESENT ILLNESS:  Pt is encephelopathic; therefore, this HPI is obtained from chart review. [name] is a 60 y.o. female with PMH as outlined below including known COPD (on chronic 5 L O2 and 24/7) as well as history of noncompliance with outpatient follow-up. She had hospitalization here at [facility] in April where she required intubation. She was instructed to follow-up with Dr. Elsworth Soho however she never did so. She was brought to [facility] ED 12/02/15 with SOB. Husband reports that SOB is ongoing on a daily basis however over the past 2 weeks it has been worse. Symptoms have exacerbated over the past 2-3 days. Husband states that she got to the point where she could barely breathe at all with minimal activity and this is what prompted them to call EMS.  In ED, she was in significant respiratory distress for which she was immediately intubated. She apparently has severe claustrophobia and has made it clear the past that she would never ever want BiPAP or CPAP. Husband states that she is so claustrophobic that she fears being buried after she passes due to being stuck underground.  She has not had any recent fevers/chills/sweats, headaches, chest pain, increasing wheezing, N/V/D, abdominal pain, myalgias, exposures to known sick contacts.  She is a former smoker, quit roughly 5 years ago.  Husband states that she only sees her doctor once or twice a year and that she doesn't like going to the doctor because she feels that she can barely breathe well enough to get there.  When asked why she has not followed up with pulmonary, husband states that she is just very stubborn. Of note, patient's son states that "if she could just get pain pills and Xanax", she probably be much better  off.  SUBJECTIVE:  Failed wean this am with agitation and increased wob   VITAL SIGNS: BP (!) 98/54   Pulse 96   Temp 100.1 F (37.8 C) (Oral)   Resp (!) 24   Ht '5\' 6"'$  (1.676 m) Comment: per chart review  Wt 200 lb 6.4 oz (90.9 kg)   SpO2 96%   BMI 32.35 kg/m   HEMODYNAMICS:    VENTILATOR SETTINGS: Vent Mode: PRVC FiO2 (%):  [40 %] 40 % Set Rate:  [24 bmp] 24 bmp Vt Set:  [480 mL] 480 mL PEEP:  [5 cmH20] 5 cmH20 Plateau Pressure:  [10 ELF81-01 cmH20] 20 cmH20  INTAKE / OUTPUT: I/O last 3 completed shifts: In: 3931.7 [I.V.:2297.2; NG/GT:766.5; IV Piggyback:868] Out: 7510 [Urine:3275]  PHYSICAL EXAMINATION: General: Chronically ill-appearing female, agitated on vent Neuro: Agitated on vent, moving all extremities., follows some simple commands HEENT: [place]/AT. PERRL, sclerae anicteric. Cardiovascular: RRR, no M/R/G.  Lungs: Respirations even and unlabored.  Breath sounds distant Abdomen: Obese, BS x 4, soft, NT/ND.  Musculoskeletal: No gross deformities, no edema.  Skin: Intact, warm, no rashes.  LABS:  BMET  Recent Labs Lab 12/03/15 0140 12/03/15 0454 12/04/15 0520  NA 140 140 140  K 4.0 3.7 3.7  CL 91* 92* 88*  CO2 35* 38* 41*  BUN 23* 23* 26*  CREATININE 1.04* 0.98 0.70  GLUCOSE 314* 267* 231*   Electrolytes  Recent Labs Lab 12/03/15 0140  12/03/15 0454 12/03/15 1216 12/03/15 1838 12/04/15 0520  CALCIUM 9.0  --  8.7*  --   --  8.5*  MG  --   < > 1.7 2.1 2.0 2.1  PHOS  --   < > <1.0* 1.3* 3.1 2.9  < > = values in this interval not displayed. CBC  Recent Labs Lab 12/02/15 2116 12/03/15 0454 12/04/15 0520  WBC 9.9 8.6 12.8*  HGB 14.0 12.2 12.0  HCT 51.2* 44.0 41.5  PLT 190 165 148*   Coag's  Recent Labs Lab 12/03/15 0140 12/03/15 2019 12/04/15 0520  APTT 39* 61* 67*   Sepsis Markers  Recent Labs Lab 12/02/15 2127 12/03/15 0140 12/03/15 1216  LATICACIDVEN 2.40* 3.9* 1.3   ABG  Recent Labs Lab 12/02/15 2339  12/03/15 0348 12/04/15 0531  PHART 7.357 7.398 7.519*  PCO2ART 85.2* 64.8* 56.4*  PO2ART 43.0* 222.0* 73.4*   Liver Enzymes  Recent Labs Lab 12/02/15 2116  AST 34  ALT 17  ALKPHOS 75  BILITOT 1.8*  ALBUMIN 3.3*   Cardiac Enzymes  Recent Labs Lab 12/02/15 2116  TROPONINI <0.03   Glucose  Recent Labs Lab 12/03/15 0404 12/03/15 0742 12/03/15 1113 12/03/15 1544 12/03/15 1959 12/03/15 2330  GLUCAP 305* 208* 150* 220* 199* 151*   Imaging Dg Chest Port 1 View  Result Date: 12/04/2015 CLINICAL DATA:  Acute exacerbation of COPD.  Intubation. EXAM: PORTABLE CHEST 1 VIEW COMPARISON:  12/03/2015 and 12/02/2015 FINDINGS: Endotracheal tube in good position. NG tube tip is below the diaphragm. There is a 2.5 cm ill-defined density at the superior lateral aspect of the left hilum which may be mass. The infiltrates and effusions and pulmonary edema have resolved. Heart size and pulmonary vascularity are normal. IMPRESSION: Clearing of the infiltrates and pulmonary edema. Possible mass at the left hilum. CT scan with contrast recommended for further evaluation. Electronically Signed   By: Lorriane Shire [name]   On: 12/04/2015 09:01   STUDIES:  CXR 10/26 > no active process. CXR 10/28 Clearing of the infiltrates and pulmonary edema. Possible mass at the left hilum  CULTURES: Blood 10/26 >  Sputum 10/26 >   ANTIBIOTICS: Ceftriaxone 10/26 >   SIGNIFICANT EVENTS: 10/26 > admitted with presumed AECOPD requiring intubation.  LINES/TUBES: ETT 10/26 >   DISCUSSION: 60 y.o. female with history of COPD and noncompliance, admitted 10/26 with presumed AECOPD requiring intubation.  ASSESSMENT / PLAN:  PULMONARY A: Acute on chronic hypoxic and hypercarbic respiratory failure. COPD per report - no PFTs available. Hx intolerance to NIMV due to claustrophobia, noncompliance with outpatient follow-up. ? Left hilar mass vs asdz  P:   Begin PS trials but no extubation given mental  status. Aggressive diureses today. Wean as able. VAP prevention measures. Budesonide/Brovana, DuoNeb's. Solu-Medrol, Ceftriaxone. CXR in AM.  CARDIOVASCULAR A:  Hx CAD. P:  Continue preadmission atorvastatin. Hold preadmission Imdur. Tele monitoring  RENAL A:   Mild hyperkalemia. P:   KVO IVF Lasix 40 mg IV q6 x3 doses.  GASTROINTESTINAL A:   Obesity. GI prophylaxis. Nutrition. P:   SUP: Pantoprazole. NPO. TF as per nutrition  HEMATOLOGIC A:   On chronic Xarelto - reportedly due to TIAs (? Unclear why on Xarelto?). VTE Prophylaxis. P:  Hold preadmission Xarelto. SCD's / heparin drip. CBC in AM.  INFECTIOUS A:   AECOPD P:   Abx as above (ceftriaxone).  Follow cultures as above.  ENDOCRINE A:   DM.   P:   SSI. Hold preadmission NovoLog, tresiba. Lantus 5 BID  NEUROLOGIC A:   Acute encephalopathy. Hx TIA, arthritis. ? Concern  for substance abuse - patient's son stated that if patient could get pain pills and Xanax that she would be much better off. P:   Sedation:  Propofol drip/fentanyl drip. RASS goal: 0 to -1. Daily WUA. Hold preadmission gabapentin, morphine. Clonazepam 1 mg BID and Fentanyl patch 25 mcg  Family updated: Husband updated bedside  Interdisciplinary Family Meeting v Palliative Care Meeting:  Due by: 11/2.  Tammy Parrett NP-C  [facility]  [phone]  Attending Note:  Above note edited in full.  60 year old female with CHF and COPD history presenting with acute on chronic respiratory failure.  O2 dependent at home.  OSA history that refused CPAP or BiPAP (said would rather die).  On exam, patient is intermittently agitated even on sedation, lungs with distant lung sounds.  I reviewed CXR myself, hyperinflation and pulmonary edema noted.  Will being PS trials, aggressive diureses, titrate O2 down as able, no family bedside to address code status farther.  Continue as full code for now but that is  something that will need to be addressed.  The patient is critically ill with multiple organ systems failure and requires high complexity decision making for assessment and support, frequent evaluation and titration of therapies, application of advanced monitoring technologies and extensive interpretation of multiple databases.   Critical Care Time devoted to patient care services described in this note is  34  Minutes. This time reflects time of care of this signee Dr Jennet Maduro. This critical care time does not reflect procedure time, or teaching time or supervisory time of PA/NP/Med student/Med Resident etc but could involve care discussion time.  Rush Farmer, [name] [facility] Pulmonary/Critical Care Medicine. Pager: [phone]. After hours pager: [phone].   12/04/2015

## 2015-12-04 NOTE — Progress Notes (Addendum)
ANTICOAGULATION CONSULT NOTE - Follow Up Consult  Pharmacy Consult for heparin Indication: Atherosclerosis of coronary artery of native valve  Allergies  Allergen Reactions  . Ciprofloxacin Rash    REACTION: hives/rash    Patient Measurements: Height: '5\' 6"'$  (167.6 cm) (per chart review) Weight: 200 lb 6.4 oz (90.9 kg) IBW/kg (Calculated) : 59.3 Heparin Dosing Weight: 73 kg  Vital Signs: Temp: 95.9 F (35.5 C) (10/28 0426) Temp Source: Oral (10/28 0426) BP: 98/50 (10/28 0600) Pulse Rate: 79 (10/28 0600)  Labs:  Recent Labs  12/02/15 2116 12/03/15 0140 12/03/15 0454 12/03/15 2019 12/04/15 0520  HGB 14.0  --  12.2  --  12.0  HCT 51.2*  --  44.0  --  41.5  PLT 190  --  165  --  148*  APTT  --  39*  --  61* 67*  HEPARINUNFRC  --  >2.20*  --  1.20* 0.24*  CREATININE 0.73 1.04* 0.98  --  0.70  TROPONINI <0.03  --   --   --   --     Estimated Creatinine Clearance: 82.8 mL/min (by C-G formula based on SCr of 0.7 mg/dL).  Assessment:  21 YOF with history of atherosclerosis of the coronary artery/native valve to continue on IV heparin while PTA Xarelto is on hold. Was dosing with aPTT due to Xarelto falsely elevating heparin levels but aPTT/Heparin levels are now correlating. HL subtherapeutic this AM. CBC stable.   Goal of Therapy:  Heparin level 0.3-0.7 units/ml Monitor platelets by anticoagulation protocol: Yes   Plan:  Increase heparin gtt to 1350 units/hr 6 hr heparin level Daily heparin level, CBC Monitor for S&S of bleed  Angela Burke, PharmD Pharmacy Resident Pager: [phone] 12/04/2015,7:23 AM

## 2015-12-05 ENCOUNTER — Inpatient Hospital Stay (HOSPITAL_COMMUNITY): Payer: 59

## 2015-12-05 LAB — POCT I-STAT 3, ART BLOOD GAS (G3+)
ACID-BASE EXCESS: 22 mmol/L — AB (ref 0.0–2.0)
Bicarbonate: 47.4 mmol/L — ABNORMAL HIGH (ref 20.0–28.0)
O2 SAT: 97 %
PH ART: 7.572 — AB (ref 7.350–7.450)
PO2 ART: 82 mmHg — AB (ref 83.0–108.0)
Patient temperature: 99.5
TCO2: 49 mmol/L (ref 0–100)
pCO2 arterial: 51.6 mmHg — ABNORMAL HIGH (ref 32.0–48.0)

## 2015-12-05 LAB — GLUCOSE, CAPILLARY
GLUCOSE-CAPILLARY: 304 mg/dL — AB (ref 65–99)
GLUCOSE-CAPILLARY: 287 mg/dL — AB (ref 65–99)
GLUCOSE-CAPILLARY: 214 mg/dL — AB (ref 65–99)
Glucose-Capillary: 135 mg/dL — ABNORMAL HIGH (ref 65–99)
Glucose-Capillary: 186 mg/dL — ABNORMAL HIGH (ref 65–99)
Glucose-Capillary: 195 mg/dL — ABNORMAL HIGH (ref 65–99)

## 2015-12-05 LAB — CBC
HCT: 44.2 % (ref 36.0–46.0)
HEMOGLOBIN: 13.1 g/dL (ref 12.0–15.0)
MCH: 28.1 pg (ref 26.0–34.0)
MCHC: 29.6 g/dL — AB (ref 30.0–36.0)
MCV: 94.6 fL (ref 78.0–100.0)
PLATELETS: 149 10*3/uL — AB (ref 150–400)
RBC: 4.67 MIL/uL (ref 3.87–5.11)
RDW: 15.1 % (ref 11.5–15.5)
WBC: 13.3 10*3/uL — ABNORMAL HIGH (ref 4.0–10.5)

## 2015-12-05 LAB — BASIC METABOLIC PANEL
Anion gap: 13 (ref 5–15)
BUN: 40 mg/dL — ABNORMAL HIGH (ref 6–20)
CALCIUM: 9.3 mg/dL (ref 8.9–10.3)
CHLORIDE: 86 mmol/L — AB (ref 101–111)
CO2: 38 mmol/L — ABNORMAL HIGH (ref 22–32)
CREATININE: 0.95 mg/dL (ref 0.44–1.00)
Glucose, Bld: 276 mg/dL — ABNORMAL HIGH (ref 65–99)
Potassium: 4.2 mmol/L (ref 3.5–5.1)
SODIUM: 137 mmol/L (ref 135–145)

## 2015-12-05 LAB — CULTURE, BLOOD (ROUTINE X 2)

## 2015-12-05 LAB — TRIGLYCERIDES: Triglycerides: 164 mg/dL — ABNORMAL HIGH (ref <150)

## 2015-12-05 LAB — PHOSPHORUS: PHOSPHORUS: 3.8 mg/dL (ref 2.5–4.6)

## 2015-12-05 LAB — HEPARIN LEVEL (UNFRACTIONATED): HEPARIN UNFRACTIONATED: 0.64 [IU]/mL (ref 0.30–0.70)

## 2015-12-05 LAB — MAGNESIUM: MAGNESIUM: 2.2 mg/dL (ref 1.7–2.4)

## 2015-12-05 MED ORDER — ONDANSETRON HCL 4 MG/2ML IJ SOLN
4.0000 mg | Freq: Three times a day (TID) | INTRAMUSCULAR | Status: DC | PRN
  Administered 2015-12-05 – 2015-12-07 (×4): 4 mg via INTRAVENOUS
  Filled 2015-12-05 (×4): qty 2

## 2015-12-05 MED ORDER — FUROSEMIDE 10 MG/ML IJ SOLN
20.0000 mg | Freq: Four times a day (QID) | INTRAMUSCULAR | Status: AC
  Administered 2015-12-05 (×3): 20 mg via INTRAVENOUS
  Filled 2015-12-05 (×2): qty 2

## 2015-12-05 NOTE — Progress Notes (Signed)
Wasted 230 cc of fentanyl with second RN, Christian.

## 2015-12-05 NOTE — Progress Notes (Signed)
ANTICOAGULATION CONSULT NOTE - Follow Up Consult  Pharmacy Consult for Heparin (Xarelto on hold) Indication: Atherosclerosis of coronary artery  Allergies  Allergen Reactions  . Ciprofloxacin Rash    REACTION: hives/rash    Patient Measurements: Height: '5\' 6"'$  (167.6 cm) (per chart review) Weight: 200 lb 6.4 oz (90.9 kg) IBW/kg (Calculated) : 59.3  Vital Signs: Temp: 99.5 F (37.5 C) (10/28 2354) Temp Source: Oral (10/28 2354) BP: 100/64 (10/28 2300) Pulse Rate: 87 (10/28 2327)  Labs:  Recent Labs  12/02/15 2116  12/03/15 0140 12/03/15 0454 12/03/15 2019 12/04/15 0520 12/04/15 1417 12/04/15 2318  HGB 14.0  --   --  12.2  --  12.0  --   --   HCT 51.2*  --   --  44.0  --  41.5  --   --   PLT 190  --   --  165  --  148*  --   --   APTT  --   --  39*  --  61* 67*  --   --   HEPARINUNFRC  --   < > >2.20*  --  1.20* 0.24* 1.00* 0.61  CREATININE 0.73  --  1.04* 0.98  --  0.70  --   --   TROPONINI <0.03  --   --   --   --   --   --   --   < > = values in this interval not displayed.  Estimated Creatinine Clearance: 82.8 mL/min (by C-G formula based on SCr of 0.7 mg/dL).   Assessment: Therapeutic heparin level x 1 after rate decrease  Goal of Therapy:  Heparin level 0.3-0.7 units/ml Monitor platelets by anticoagulation protocol: Yes   Plan:  -Cont heparin 1250 units/hr -Confirmatory HL with AM labs  Narda Bonds 12/05/2015,12:12 AM

## 2015-12-05 NOTE — Progress Notes (Signed)
Patient extubated to 6L [place] at 0910. Bp 134/79, HR 133, RR 21,

## 2015-12-05 NOTE — Procedures (Signed)
Extubation Procedure Note  Patient Details:   Name: Nyaja Dubuque DOB: 1955-04-10 MRN: [id number]   Airway Documentation:  Airway 7.5 mm (Active)  Secured at (cm) 22 cm 12/05/2015  7:52 AM  Measured From Lips 12/05/2015  7:52 AM  Secured Location Right 12/05/2015  7:52 AM  Secured By [employer] 12/05/2015  7:52 AM  Tube Holder Repositioned Yes 12/05/2015  7:52 AM  Cuff Pressure (cm H2O) 26 cm H2O 12/05/2015  3:55 AM  Site Condition Dry 12/05/2015  7:52 AM    Evaluation  O2 sats: stable throughout Complications: No apparent complications Patient did tolerate procedure well. Bilateral Breath Sounds: Diminished, Clear   Yes   Pt had a positive cuff leak prior to extubation.  Pt placed on nasal cannula 5 L (wears at home). Pt able to get 500 on Incentive spirometer. No stridor noted.  Mingo Amber Dayanne Yiu 12/05/2015, 10:21 AM

## 2015-12-05 NOTE — Progress Notes (Signed)
Pt placed on HFNC 7 Lpm due to increased anxiety.

## 2015-12-05 NOTE — Progress Notes (Signed)
PULMONARY / CRITICAL CARE MEDICINE   Name: [name] MRN: [id number] DOB: [birth year]    ADMISSION DATE:  12/02/2015 CONSULTATION DATE:  12/02/15  REFERRING MD:  Regenia Skeeter - EDP  CHIEF COMPLAINT:  SOB  HISTORY OF PRESENT ILLNESS:  Pt is encephelopathic; therefore, this HPI is obtained from chart review. [name] is a 60 y.o. female with PMH as outlined below including known COPD (on chronic 5 L O2 and 24/7) as well as history of noncompliance with outpatient follow-up. She had hospitalization here at [facility] in April where she required intubation. She was instructed to follow-up with Dr. Elsworth Soho however she never did so. She was brought to [facility] ED 12/02/15 with SOB. Husband reports that SOB is ongoing on a daily basis however over the past 2 weeks it has been worse. Symptoms have exacerbated over the past 2-3 days. Husband states that she got to the point where she could barely breathe at all with minimal activity and this is what prompted them to call EMS.  In ED, she was in significant respiratory distress for which she was immediately intubated. She apparently has severe claustrophobia and has made it clear the past that she would never ever want BiPAP or CPAP. Husband states that she is so claustrophobic that she fears being buried after she passes due to being stuck underground.  She has not had any recent fevers/chills/sweats, headaches, chest pain, increasing wheezing, N/V/D, abdominal pain, myalgias, exposures to known sick contacts.  She is a former smoker, quit roughly 5 years ago.  Husband states that she only sees her doctor once or twice a year and that she doesn't like going to the doctor because she feels that she can barely breathe well enough to get there.  When asked why she has not followed up with pulmonary, husband states that she is just very stubborn. Of note, patient's son states that "if she could just get pain pills and Xanax", she probably be much better  off.  SUBJECTIVE:  Weaning well this AM  VITAL SIGNS: BP 116/64   Pulse 84   Temp 98.4 F (36.9 C) (Oral)   Resp 18   Ht '5\' 6"'$  (1.676 m) Comment: per chart review  Wt 87.5 kg (192 lb 14.4 oz)   SpO2 94%   BMI 31.14 kg/m   HEMODYNAMICS:    VENTILATOR SETTINGS: Vent Mode: CPAP;PSV FiO2 (%):  [40 %] 40 % Set Rate:  [18 bmp-24 bmp] 18 bmp Vt Set:  [400 mL-480 mL] 400 mL PEEP:  [5 cmH20] 5 cmH20 Pressure Support:  [5 cmH20-8 cmH20] 5 cmH20 Plateau Pressure:  [19 cmH20-21 cmH20] 20 cmH20  INTAKE / OUTPUT: I/O last 3 completed shifts: In: 2825.4 [I.V.:1755.4; NG/GT:970; IV Piggyback:100] Out: 5329 [Urine:6350]  PHYSICAL EXAMINATION: General: Chronically ill-appearing female, agitated on vent Neuro: Agitated on vent, moving all extremities, follows commands HEENT: [place]/AT. PERRL, sclerae anicteric. Cardiovascular: RRR, nl S1/S2, no M/R/G.  Lungs: Respirations even and unlabored.  Breath sounds distant Abdomen: Obese, BS x 4, soft, NT/ND.  Musculoskeletal: No gross deformities, no edema.  Skin: Intact, warm, no rashes.  LABS:  BMET  Recent Labs Lab 12/03/15 0454 12/04/15 0520 12/05/15 0531  NA 140 140 137  K 3.7 3.7 4.2  CL 92* 88* 86*  CO2 38* 41* 38*  BUN 23* 26* 40*  CREATININE 0.98 0.70 0.95  GLUCOSE 267* 231* 276*   Electrolytes  Recent Labs Lab 12/03/15 0454  12/04/15 0520 12/04/15 1657 12/05/15 0531  CALCIUM 8.7*  --  8.5*  --  9.3  MG 1.7  < > 2.1 2.1 2.2  PHOS <1.0*  < > 2.9 3.5 3.8  < > = values in this interval not displayed. CBC  Recent Labs Lab 12/03/15 0454 12/04/15 0520 12/05/15 0531  WBC 8.6 12.8* 13.3*  HGB 12.2 12.0 13.1  HCT 44.0 41.5 44.2  PLT 165 148* 149*   Coag's  Recent Labs Lab 12/03/15 0140 12/03/15 2019 12/04/15 0520  APTT 39* 61* 67*   Sepsis Markers  Recent Labs Lab 12/02/15 2127 12/03/15 0140 12/03/15 1216  LATICACIDVEN 2.40* 3.9* 1.3   ABG  Recent Labs Lab 12/03/15 0348 12/04/15 0531  12/05/15 0441  PHART 7.398 7.519* 7.572*  PCO2ART 64.8* 56.4* 51.6*  PO2ART 222.0* 73.4* 82.0*   Liver Enzymes  Recent Labs Lab 12/02/15 2116  AST 34  ALT 17  ALKPHOS 75  BILITOT 1.8*  ALBUMIN 3.3*   Cardiac Enzymes  Recent Labs Lab 12/02/15 2116  TROPONINI <0.03   Glucose  Recent Labs Lab 12/04/15 1204 12/04/15 1608 12/04/15 1949 12/04/15 2353 12/05/15 0446 12/05/15 0758  GLUCAP 198* 262* 204* 183* 304* 195*   Imaging No results found. STUDIES:  CXR 10/26 > no active process. CXR 10/28 Clearing of the infiltrates and pulmonary edema. Possible mass at the left hilum  CULTURES: Blood 10/26 >  Sputum 10/26 >   ANTIBIOTICS: Ceftriaxone 10/26 >   SIGNIFICANT EVENTS: 10/26 > admitted with presumed AECOPD requiring intubation.  LINES/TUBES: ETT 10/26 >   DISCUSSION: 60 y.o. female with history of COPD and noncompliance, admitted 10/26 with presumed AECOPD requiring intubation.  ASSESSMENT / PLAN:  PULMONARY A: Acute on chronic hypoxic and hypercarbic respiratory failure. COPD per report - no PFTs available. Hx intolerance to NIMV due to claustrophobia, noncompliance with outpatient follow-up. ? Left hilar mass vs asdz  P:   SBT to extubate today. Gentle diureses today. Wean as able. VAP prevention measures. Budesonide/Brovana, DuoNeb's. Solu-Medrol, Ceftriaxone. CXR in AM. PT/OT IS OOB to chair  CARDIOVASCULAR A:  Hx CAD. P:  Continue preadmission atorvastatin. Hold preadmission Imdur. Tele monitoring  RENAL A:   Mild hyperkalemia. P:   KVO IVF Lasix 20 mg IV q6 x3 doses. Replace electrolytes as indicated. BMET in AM.  GASTROINTESTINAL A:   Obesity. GI prophylaxis. Nutrition. P:   SUP: Pantoprazole. NPO. TF as per nutrition.  HEMATOLOGIC A:   On chronic Xarelto - reportedly due to TIAs (? Unclear why on Xarelto?). VTE Prophylaxis. P:  Hold preadmission Xarelto. SCD's / heparin drip. CBC in AM.  INFECTIOUS A:    AECOPD P:   Abx as above (ceftriaxone).  Follow cultures as above.  ENDOCRINE A:   DM.   P:   SSI. Hold preadmission NovoLog, tresiba. Lantus 5 BID  NEUROLOGIC A:   Acute encephalopathy. Hx TIA, arthritis. ? Concern for substance abuse - patient's son stated that if patient could get pain pills and Xanax that she would be much better off. P:   D/C sedation. RASS goal: 0 to -1. Daily WUA. Hold preadmission gabapentin, morphine. Clonazepam 1 mg BID and Fentanyl patch 25 mcg  Family updated: Husband updated bedside  Interdisciplinary Family Meeting v Palliative Care Meeting:  Due by: 11/2.  The patient is critically ill with multiple organ systems failure and requires high complexity decision making for assessment and support, frequent evaluation and titration of therapies, application of advanced monitoring technologies and extensive interpretation of multiple databases.   Critical Care Time devoted to patient care services  described in this note is  25  Minutes. This time reflects time of care of this signee Dr Jennet Maduro. This critical care time does not reflect procedure time, or teaching time or supervisory time of PA/NP/Med student/Med Resident etc but could involve care discussion time.  Rush Farmer, [name] [facility] Pulmonary/Critical Care Medicine. Pager: [phone]. After hours pager: [phone].   12/05/2015

## 2015-12-05 NOTE — Progress Notes (Signed)
ANTICOAGULATION CONSULT NOTE - Follow Up Consult  Pharmacy Consult for heparin Indication: Atherosclerosis of coronary artery of native valve  Allergies  Allergen Reactions  . Ciprofloxacin Rash    REACTION: hives/rash    Patient Measurements: Height: '5\' 6"'$  (167.6 cm) (per chart review) Weight: 192 lb 14.4 oz (87.5 kg) IBW/kg (Calculated) : 59.3 Heparin Dosing Weight: 73 kg  Vital Signs: Temp: 97.6 F (36.4 C) (10/29 1152) Temp Source: Oral (10/29 1152) BP: 134/79 (10/29 0910) Pulse Rate: 141 (10/29 0910)  Labs:  Recent Labs  12/02/15 2116  12/03/15 0140 12/03/15 0454 12/03/15 2019 12/04/15 0520 12/04/15 1417 12/04/15 2318 12/05/15 0531  HGB 14.0  --   --  12.2  --  12.0  --   --  13.1  HCT 51.2*  --   --  44.0  --  41.5  --   --  44.2  PLT 190  --   --  165  --  148*  --   --  149*  APTT  --   --  39*  --  61* 67*  --   --   --   HEPARINUNFRC  --   < > >2.20*  --  1.20* 0.24* 1.00* 0.61 0.64  CREATININE 0.73  --  1.04* 0.98  --  0.70  --   --  0.95  TROPONINI <0.03  --   --   --   --   --   --   --   --   < > = values in this interval not displayed.  Estimated Creatinine Clearance: 68.4 mL/min (by C-G formula based on SCr of 0.95 mg/dL).  Assessment: 57 YOF with history of atherosclerosis of the coronary artery/native valve to continue on IV heparin while PTA Xarelto is on hold. Heparin level remains at goal at 0.64 but is at the upper end of the goal range. No bleeding noted.   Goal of Therapy:  Heparin level 0.3-0.7 units/ml Monitor platelets by anticoagulation protocol: Yes   Plan:  Decrease heparin gtt slightly to 1200 units/hr Daily heparin level and CBC  Salome Arnt, PharmD, BCPS Pager # [phone] 12/05/2015 12:47 PM

## 2015-12-06 DIAGNOSIS — Z4659 Encounter for fitting and adjustment of other gastrointestinal appliance and device: Secondary | ICD-10-CM

## 2015-12-06 LAB — CBC
HEMATOCRIT: 48.9 % — AB (ref 36.0–46.0)
HEMOGLOBIN: 14.7 g/dL (ref 12.0–15.0)
MCH: 29.1 pg (ref 26.0–34.0)
MCHC: 30.1 g/dL (ref 30.0–36.0)
MCV: 96.6 fL (ref 78.0–100.0)
Platelets: 141 10*3/uL — ABNORMAL LOW (ref 150–400)
RBC: 5.06 MIL/uL (ref 3.87–5.11)
RDW: 14.8 % (ref 11.5–15.5)
WBC: 14.2 10*3/uL — AB (ref 4.0–10.5)

## 2015-12-06 LAB — GLUCOSE, CAPILLARY
GLUCOSE-CAPILLARY: 162 mg/dL — AB (ref 65–99)
GLUCOSE-CAPILLARY: 219 mg/dL — AB (ref 65–99)
GLUCOSE-CAPILLARY: 181 mg/dL — AB (ref 65–99)
Glucose-Capillary: 218 mg/dL — ABNORMAL HIGH (ref 65–99)
Glucose-Capillary: 259 mg/dL — ABNORMAL HIGH (ref 65–99)

## 2015-12-06 LAB — BLOOD GAS, ARTERIAL
ACID-BASE EXCESS: 20.6 mmol/L — AB (ref 0.0–2.0)
BICARBONATE: 45.6 mmol/L — AB (ref 20.0–28.0)
Drawn by: 236041
FIO2: 40
LHR: 24 {breaths}/min
MECHVT: 480 mL
O2 SAT: 95.5 %
PATIENT TEMPERATURE: 98.6
PCO2 ART: 56.4 mmHg — AB (ref 32.0–48.0)
PEEP/CPAP: 5 cmH2O
PH ART: 7.519 — AB (ref 7.350–7.450)
PO2 ART: 73.4 mmHg — AB (ref 83.0–108.0)

## 2015-12-06 LAB — BASIC METABOLIC PANEL
ANION GAP: 15 (ref 5–15)
BUN: 44 mg/dL — ABNORMAL HIGH (ref 6–20)
CHLORIDE: 82 mmol/L — AB (ref 101–111)
CO2: 44 mmol/L — AB (ref 22–32)
Calcium: 9.9 mg/dL (ref 8.9–10.3)
Creatinine, Ser: 0.89 mg/dL (ref 0.44–1.00)
GFR calc Af Amer: >60 mL/min (ref >60)
GFR calc non Af Amer: >60 mL/min (ref >60)
GLUCOSE: 191 mg/dL — AB (ref 65–99)
POTASSIUM: 4.7 mmol/L (ref 3.5–5.1)
Sodium: 141 mmol/L (ref 135–145)

## 2015-12-06 LAB — MAGNESIUM: Magnesium: 2.4 mg/dL (ref 1.7–2.4)

## 2015-12-06 LAB — PHOSPHORUS: PHOSPHORUS: 7.4 mg/dL — AB (ref 2.5–4.6)

## 2015-12-06 LAB — HEPARIN LEVEL (UNFRACTIONATED): Heparin Unfractionated: 0.57 IU/mL (ref 0.30–0.70)

## 2015-12-06 MED ORDER — METHYLPREDNISOLONE SODIUM SUCC 40 MG IJ SOLR: 40.0000 mg | INTRAMUSCULAR | Status: DC

## 2015-12-06 MED ORDER — TAB-A-VITE/IRON PO TABS
1.0000 | ORAL_TABLET | Freq: Every day | ORAL | Status: DC
  Administered 2015-12-06 – 2015-12-08 (×3): 1 via ORAL
  Filled 2015-12-06 (×3): qty 1

## 2015-12-06 MED ORDER — PREDNISONE 20 MG PO TABS
40.0000 mg | ORAL_TABLET | Freq: Every day | ORAL | Status: DC
  Administered 2015-12-07 – 2015-12-08 (×2): 40 mg via ORAL
  Filled 2015-12-06 (×2): qty 2

## 2015-12-06 MED ORDER — GABAPENTIN 100 MG PO CAPS
100.0000 mg | ORAL_CAPSULE | Freq: Three times a day (TID) | ORAL | Status: DC
  Administered 2015-12-06 – 2015-12-08 (×6): 100 mg via ORAL
  Filled 2015-12-06 (×7): qty 1

## 2015-12-06 MED ORDER — RIVAROXABAN 20 MG PO TABS
20.0000 mg | ORAL_TABLET | Freq: Every day | ORAL | Status: DC
  Administered 2015-12-06 – 2015-12-07 (×2): 20 mg via ORAL
  Filled 2015-12-06 (×2): qty 1

## 2015-12-06 NOTE — Care Management Note (Signed)
Case Management Note  Patient Details  Name: [name] MRN: [id number] Date of Birth: August 07, 1955  Subjective/Objective:    Admitted with COPD excerbation      Action/Plan:  PTA from home with husband  - on chronic 5 L O2 at home.  Pt is now ventilated.  CM will continue to follow for discharge needs   Expected Discharge Date:                  Expected Discharge Plan:     In-House Referral:     Discharge planning Services  CM Consult  Post Acute Care Choice:    Choice offered to:     DME Arranged:    DME Agency:     HH Arranged:    HH Agency:     Status of Service:  In process, will continue to follow  If discussed at Long Length of Stay Meetings, dates discussed:    Additional Comments: 12/06/2015 CM requested HHRN and CM consult via physician sticky notes Maryclare Labrador, RN 12/06/2015, 2:22 PM

## 2015-12-06 NOTE — Evaluation (Signed)
Clinical/Bedside Swallow Evaluation Patient Details  Name: [name] MRN: [id number] Date of Birth: 1955-08-08  Today's Date: 12/06/2015 Time: SLP Start Time (ACUTE ONLY): 1478 SLP Stop Time (ACUTE ONLY): 0953 SLP Time Calculation (min) (ACUTE ONLY): 24 min  Past Medical History:  Past Medical History:  Diagnosis Date  . Acute MI   . Arthritis   . COPD (chronic obstructive pulmonary disease) ([place])    home O2  . Coronary artery disease   . Diabetes mellitus without complication ([place])   . Shortness of breath   . Stroke [facility]) TIA    Past Surgical History:  Past Surgical History:  Procedure Laterality Date  . ABDOMINAL HYSTERECTOMY    . CARDIAC CATHETERIZATION    . CORONARY ARTERY BYPASS GRAFT     2003   HPI:  [name] is a 60 y.o. female with PMH as outlined below including known COPD (on chronic 5 L O2 and 24/7) as well as history of noncompliance with outpatient follow-up. Pt was intubated on 10/26 and extubated on 10/29.   Assessment / Plan / Recommendation Clinical Impression  Pt upright in chair upon arrival. She presented with normal vocal quality and a strong cough. Given thin liquids via tsp and cup, pt displayed no overt s/s of aspiration. Thin liquids via straw were administered and immediate throat clearing was observed, but pt's vocal quality remained normal. Pt consumed regular solid trial, and needed liquid wash to aid in oral clearance. Recommend Dys 2 diet and thin liquids. SLP educated pt to take breaks as needed during meals given decreased respiratory status. Will continue to follow for diet tolerance and advancement.    Aspiration Risk  Mild aspiration risk    Diet Recommendation Dysphagia 2 (Fine chop);Thin liquid   Liquid Administration via: Cup;No straw Medication Administration: Whole meds with puree Supervision: Full supervision/cueing for compensatory strategies;Patient able to self feed Compensations: Minimize environmental distractions;Slow  rate;Small sips/bites Postural Changes: Seated upright at 90 degrees;Remain upright for at least 30 minutes after po intake    Other  Recommendations Oral Care Recommendations: Oral care BID   Follow up Recommendations Other (comment) (tba)      Frequency and Duration min 2x/week  2 weeks       Prognosis Prognosis for Safe Diet Advancement: Good      Swallow Study   General HPI: [name] is a 60 y.o. female with PMH as outlined below including known COPD (on chronic 5 L O2 and 24/7) as well as history of noncompliance with outpatient follow-up. Pt was intubated on 10/26 and extubated on 10/29. Type of Study: Bedside Swallow Evaluation Previous Swallow Assessment: none in chart Diet Prior to this Study: NPO Temperature Spikes Noted: No Respiratory Status: Nasal cannula History of Recent Intubation: Yes Length of Intubations (days): 3 days Date extubated: 12/05/15 Behavior/Cognition: Alert;Cooperative;Confused Oral Cavity Assessment: Within Functional Limits Oral Care Completed by SLP: No Oral Cavity - Dentition: Missing dentition;Poor condition Vision: Functional for self-feeding Self-Feeding Abilities: Able to feed self;Needs assist Patient Positioning: Upright in chair Baseline Vocal Quality: Normal Volitional Cough: Strong Volitional Swallow: Able to elicit    Oral/Motor/Sensory Function Overall Oral Motor/Sensory Function: Within functional limits   Ice Chips Ice chips: Within functional limits Presentation: Spoon;Self Fed   Thin Liquid Thin Liquid: Impaired Presentation: Self Fed;Straw;Cup;Spoon Pharyngeal  Phase Impairments: Throat Clearing - Immediate    Nectar Thick Nectar Thick Liquid: Not tested   Honey Thick Honey Thick Liquid: Not tested   Puree Puree: Not tested  Solid   GO   Solid: Impaired Presentation: Self Fed Oral Phase Functional Implications: Other (comment) (prolonged mastication)       [name], Student SLP  [name] 12/06/2015,10:54 AM

## 2015-12-06 NOTE — Evaluation (Signed)
Physical Therapy Evaluation Patient Details Name: [name] MRN: [id number] DOB: Apr 18, 1955 Today's Date: 12/06/2015   History of Present Illness  [name] is a 60 y.o. female with PMH as outlined below including known COPD (on chronic 5 L O2 and 24/7) as well as history of noncompliance with outpatient follow-up.   Clinical Impression  Pt admitted with above diagnosis. Pt currently with functional limitations due to the deficits listed below (see PT Problem List). Pt sedentary at baseline and requires assist for ADLs at baseline. Pt only amb to/from bathroom with assist of spouse.  Pt will benefit from skilled PT to increase their independence and safety with mobility to allow discharge to the venue listed below.       Follow Up Recommendations Home health PT;Supervision/Assistance - 24 hour    Equipment Recommendations  Rolling walker with 5" wheels    Recommendations for Other Services       Precautions / Restrictions Precautions Precautions: Fall Precaution Comments: SOB with all movement, 6L [place] Restrictions Weight Bearing Restrictions: No      Mobility  Bed Mobility Overal bed mobility: Needs Assistance Bed Mobility: Supine to Sit     Supine to sit: Supervision     General bed mobility comments: pt with HOB all the way up, incresaed time due to SOB  Transfers Overall transfer level: Needs assistance Equipment used: 1 person hand held assist Transfers: Sit to/from [facility] Sit to Stand: Mod assist;Max assist Stand pivot transfers: Mod assist;Max assist       General transfer comment: pt with wide base of support, shaky, very unsteady, reaching for objects to hold onto, + SOB, SPO2 at 95%, HR 136  Ambulation/Gait             General Gait Details: unable to ambulate due to SOB and anxiety  Stairs            Wheelchair Mobility    Modified Rankin (Stroke Patients Only)       Balance Overall balance assessment:  Needs assistance Sitting-balance support: Feet supported;Single extremity supported Sitting balance-Leahy Scale: Fair     Standing balance support: Bilateral upper extremity supported Standing balance-Leahy Scale: Poor                               Pertinent Vitals/Pain Pain Assessment: No/denies pain    Home Living Family/patient expects to be discharged to:: Private residence Living Arrangements: Spouse/significant other Available Help at Discharge: Family;Available 24 hours/day Type of Home: House Home Access: Stairs to enter Entrance Stairs-Rails: Left Entrance Stairs-Number of Steps: 3 Home Layout: Multi-level;Able to live on main level with bedroom/bathroom   Additional Comments: Pt sleeps in her recliner chair and toilets in her BSC.     Prior Function Level of Independence: Needs assistance   Gait / Transfers Assistance Needed: min hand held assist to transfer from her recliner to [facility] at home from husband.  Assist to go into and out of house provided by husband.  Pt reports she would love to do more, but her breathing doesn't let her.   ADL's / Homemaking Assistance Needed: assist from husband.         Hand Dominance        Extremity/Trunk Assessment   Upper Extremity Assessment: Generalized weakness           Lower Extremity Assessment: Generalized weakness      Cervical / Trunk Assessment: Normal  Communication  Communication: No difficulties  Cognition Arousal/Alertness: Awake/alert Behavior During Therapy: WFL for tasks assessed/performed Overall Cognitive Status: Within Functional Limits for tasks assessed                      General Comments General comments (skin integrity, edema, etc.): +SOB, c/o nausea/dry heaving    Exercises     Assessment/Plan    PT Assessment Patient needs continued PT services  PT Problem List Decreased strength;Decreased range of motion;Decreased activity tolerance;Decreased  balance;Decreased mobility;Decreased coordination          PT Treatment Interventions DME instruction;Gait training;Stair training;Functional mobility training;Therapeutic activities;Therapeutic exercise;Balance training    PT Goals (Current goals can be found in the Care Plan section)  Acute Rehab PT Goals Patient Stated Goal: home PT Goal Formulation: With patient/family Potential to Achieve Goals: Good    Frequency Min 3X/week   Barriers to discharge        Co-evaluation               End of Session Equipment Utilized During Treatment: Gait belt Activity Tolerance: Other (comment) (limited by breathing) Patient left: in chair;with call bell/phone within reach;with family/visitor present Nurse Communication: Mobility status         Time: 1438-8875 PT Time Calculation (min) (ACUTE ONLY): 20 min   Charges:   PT Evaluation $PT Eval Moderate Complexity: 1 Procedure     PT G CodesKingsley Callander 12/06/2015, 9:33 AM   Kittie Plater, PT, DPT Pager #: [phone] Office #: [phone]

## 2015-12-06 NOTE — Progress Notes (Signed)
Pt transferred from 6M to 5W02. Pt is stable. On O2 5L.

## 2015-12-06 NOTE — Progress Notes (Signed)
ANTICOAGULATION CONSULT NOTE - Follow Up Consult  Pharmacy Consult for heparin Indication: Atherosclerosis of coronary artery of native valve  Allergies  Allergen Reactions  . Ciprofloxacin Rash    REACTION: hives/rash    Patient Measurements: Height: '5\' 6"'$  (167.6 cm) (per chart review) Weight: 186 lb 11.7 oz (84.7 kg) IBW/kg (Calculated) : 59.3 Heparin Dosing Weight: 73 kg  Vital Signs: Temp: 98.4 F (36.9 C) (10/30 0400) Temp Source: Oral (10/30 0400) BP: 148/74 (10/30 0600) Pulse Rate: 98 (10/30 0600)  Labs:  Recent Labs  12/03/15 2019  12/04/15 0520  12/04/15 2318 12/05/15 0531 12/06/15 0216  HGB  --   < > 12.0  --   --  13.1 14.7  HCT  --   --  41.5  --   --  44.2 48.9*  PLT  --   --  148*  --   --  149* 141*  APTT 61*  --  67*  --   --   --   --   HEPARINUNFRC 1.20*  --  0.24*  < > 0.61 0.64 0.57  CREATININE  --   --  0.70  --   --  0.95 0.89  < > = values in this interval not displayed.  Estimated Creatinine Clearance: 71.9 mL/min (by C-G formula based on SCr of 0.89 mg/dL).  Assessment: 79 yoF with history of atherosclerosis of the coronary artery/native valve to continue on IV heparin while PTA Xarelto is on hold. Heparin level remains therapeutic x2 at 0.57 after recent dose decrease. Hgb/plt stable, no S/Sx bleeding noted.   Goal of Therapy:  Heparin level 0.3-0.7 units/ml Monitor platelets by anticoagulation protocol: Yes   Plan:  -Continue heparin 1200 units/hr -Daily heparin level and CBC -Monitor for S/Sx bleeding  Arrie Senate, PharmD PGY-1 Pharmacy Resident Pager: [phone] 12/06/2015

## 2015-12-06 NOTE — Progress Notes (Signed)
PULMONARY / CRITICAL CARE MEDICINE   Name: Annaya Bangert MRN: [id number] DOB: [birth year]    ADMISSION DATE:  12/02/2015 CONSULTATION DATE:  12/02/15  REFERRING MD:  Regenia Skeeter - EDP  CHIEF COMPLAINT:  SOB  Brief History: [name] is a 60 y.o. female with known COPD (on chronic 5 L O2 and 24/7) as well as history of noncompliance with outpatient follow-up, brought to [facility] ED 12/02/15 with SOB. In ED, she was in significant respiratory distress for which she was immediately intubated.   SUBJECTIVE:  Sitting in chair this morning complaining of nausea. She was extubated yesterday and breathing has been at baseline. She denies chest pain and edema. She has not had a BM since admission. Swallow study pending for today.  VITAL SIGNS: BP 133/76 (BP Location: Left Arm)   Pulse 92   Temp 97.9 F (36.6 C) (Axillary)   Resp 15   Ht '5\' 6"'$  (1.676 m) Comment: per chart review  Wt 84.7 kg (186 lb 11.7 oz)   SpO2 94%   BMI 30.14 kg/m   HEMODYNAMICS:    VENTILATOR SETTINGS:    INTAKE / OUTPUT: I/O last 3 completed shifts: In: 1189 [I.V.:894; NG/GT:195; IV Piggyback:100] Out: 6295 [Urine:5340]  PHYSICAL EXAMINATION: General:  Awake, alert, sitting in chair Neuro:  CN II-VII grossly intact, AAOx3 HEENT:  PEERL, EOMI, oral cavity clear, no thyromegaly or masses appreciated Cardiovascular:  Tachycardic, regular rhythm, no mrg, no edema Lungs:  Clear, breath sounds distant, symmetric chest wall motion Abdomen:  Soft, nontender, bowel sounds present Musculoskeletal:  Moves all 4 extremities Skin:  Warm, dry, intact  LABS:  BMET  Recent Labs Lab 12/04/15 0520 12/05/15 0531 12/06/15 0216  NA 140 137 141  K 3.7 4.2 4.7  CL 88* 86* 82*  CO2 41* 38* 44*  BUN 26* 40* 44*  CREATININE 0.70 0.95 0.89  GLUCOSE 231* 276* 191*    Electrolytes  Recent Labs Lab 12/04/15 0520 12/04/15 1657 12/05/15 0531 12/06/15 0216  CALCIUM 8.5*  --  9.3 9.9  MG 2.1 2.1 2.2 2.4  PHOS 2.9  3.5 3.8 7.4*    CBC  Recent Labs Lab 12/04/15 0520 12/05/15 0531 12/06/15 0216  WBC 12.8* 13.3* 14.2*  HGB 12.0 13.1 14.7  HCT 41.5 44.2 48.9*  PLT 148* 149* 141*    Coag's  Recent Labs Lab 12/03/15 0140 12/03/15 2019 12/04/15 0520  APTT 39* 61* 67*    Sepsis Markers  Recent Labs Lab 12/02/15 2127 12/03/15 0140 12/03/15 1216  LATICACIDVEN 2.40* 3.9* 1.3    ABG  Recent Labs Lab 12/03/15 0348 12/04/15 0531 12/05/15 0441  PHART 7.398 7.519* 7.572*  PCO2ART 64.8* 56.4* 51.6*  PO2ART 222.0* 73.4* 82.0*    Liver Enzymes  Recent Labs Lab 12/02/15 2116  AST 34  ALT 17  ALKPHOS 75  BILITOT 1.8*  ALBUMIN 3.3*    Cardiac Enzymes  Recent Labs Lab 12/02/15 2116  TROPONINI <0.03    Glucose  Recent Labs Lab 12/05/15 1154 12/05/15 1653 12/05/15 2000 12/05/15 2342 12/06/15 0344 12/06/15 0728  GLUCAP 135* 287* 214* 186* 218* 162*    Imaging No results found.   STUDIES:  CXR 10/26 > no active process. CXR 10/28 Clearing of the infiltrates and pulmonary edema. Possible mass at the left hilum  CULTURES: Blood 10/26 > coag neg staph Repeat blood 10/27 > no growth Sputum 10/26 >  Urine 10/27 > no growth  ANTIBIOTICS: Ceftriaxone 10/26 >>>10/30  SIGNIFICANT EVENTS: 10/26 > admitted with presumed AECOPD  requiring intubation. 10/29 > extubated  LINES/TUBES:  DISCUSSION: 60 y.o. female with history of COPD and noncompliance, admitted 10/26 with presumed AECOPD requiring intubation. Extubated at this time  ASSESSMENT / PLAN:  PULMONARY A: Acute on chronic hypoxic and hypercarbic respiratory failure. COPD per report - no PFTs available. Hx intolerance to NIMV due to claustrophobia, noncompliance with outpatient follow-up. ? Left hilar mass vs asdz  P:   On 5L [place] Budesonide/Brovana, DuoNeb's. Solu-Medrol, reduce PT/OT IS OOB to chair Dc abx  CARDIOVASCULAR A:  Hx CAD. P:  Continue preadmission atorvastatin. Hold  preadmission Imdur. Tele monitoring - dc  RENAL A:   Mild hyperkalemia- resolved Hyperphosphatemia  P:   KVO IVF Hold lasix, Net -2100 L yesterday Calcium wnl, renal function intact Recheck phos Replace electrolytes as indicated. BMET in AM.  GASTROINTESTINAL A:   Obesity. GI prophylaxis. Nutrition. P:   SUP: Pantoprazole. NPO. Swallow eval today  HEMATOLOGIC A:   On chronic Xarelto - reportedly due to TIAs (? Unclear why on Xarelto?). VTE Prophylaxis. P:  Start home Xarelto, then dc heparin SCD's CBC in AM.  INFECTIOUS A:   AECOPD without infecton P:   Abx as above (ceftriaxone) - dc Follow cultures as above.  ENDOCRINE A:   DM.   P:   SSI. Hold preadmission NovoLog, tresiba. Lantus 5 BID  NEUROLOGIC A:   Acute encephalopathy. Hx TIA, arthritis. ? Concern for substance abuse - patient's son stated that if patient could get pain pills and Xanax that she would be much better off. P:  . Start home gabapentin Clonazepam 1 mg BID and Fentanyl patch 25 mcg  Family updated: Husband updated bedside  Interdisciplinary Family Meeting v Palliative Care Meeting:  Due by: 11/2.  Quin Hoop, MS4 Attending Dr Titus Mould   Pulmonary and [place] Pager: [phone]  STAFF NOTE: Linwood Dibbles, MD FACP have personally reviewed patient's available data, including medical history, events of note, physical examination and test results as part of my evaluation. I have discussed with resident/NP and other care providers such as pharmacist, RN and RRT. In addition, I personally evaluated patient and elicited key findings of: awake in chair, no distress, no infection present, dc ABX and observe, seems all copd exacerbation with concerns noncompliance, maintain BDers, steroid reduction, no pcxr needed, start home regimen pain, ambulate, slp done diet started, to [facility], med floor   Lavon Paganini. Titus Mould, MD, [place] Pgr:  [facility] 12/06/2015 11:09 AM    12/06/2015, 9:09 AM

## 2015-12-07 LAB — GLUCOSE, CAPILLARY
GLUCOSE-CAPILLARY: 153 mg/dL — AB (ref 65–99)
GLUCOSE-CAPILLARY: 264 mg/dL — AB (ref 65–99)
GLUCOSE-CAPILLARY: 270 mg/dL — AB (ref 65–99)
Glucose-Capillary: 105 mg/dL — ABNORMAL HIGH (ref 65–99)
Glucose-Capillary: 102 mg/dL — ABNORMAL HIGH (ref 65–99)
Glucose-Capillary: 352 mg/dL — ABNORMAL HIGH (ref 65–99)

## 2015-12-07 LAB — CULTURE, BLOOD (ROUTINE X 2): Culture: NO GROWTH

## 2015-12-07 LAB — BASIC METABOLIC PANEL
ANION GAP: 12 (ref 5–15)
BUN: 36 mg/dL — ABNORMAL HIGH (ref 6–20)
CALCIUM: 9.7 mg/dL (ref 8.9–10.3)
CO2: 40 mmol/L — ABNORMAL HIGH (ref 22–32)
Chloride: 84 mmol/L — ABNORMAL LOW (ref 101–111)
Creatinine, Ser: 0.76 mg/dL (ref 0.44–1.00)
Glucose, Bld: 84 mg/dL (ref 65–99)
Potassium: 4.4 mmol/L (ref 3.5–5.1)
SODIUM: 136 mmol/L (ref 135–145)

## 2015-12-07 LAB — CBC
HCT: 49.8 % — ABNORMAL HIGH (ref 36.0–46.0)
Hemoglobin: 14.5 g/dL (ref 12.0–15.0)
MCH: 28.4 pg (ref 26.0–34.0)
MCHC: 29.1 g/dL — ABNORMAL LOW (ref 30.0–36.0)
MCV: 97.6 fL (ref 78.0–100.0)
PLATELETS: 140 10*3/uL — AB (ref 150–400)
RBC: 5.1 MIL/uL (ref 3.87–5.11)
RDW: 14.9 % (ref 11.5–15.5)
WBC: 12 10*3/uL — AB (ref 4.0–10.5)

## 2015-12-07 LAB — PHOSPHORUS: Phosphorus: 4.3 mg/dL (ref 2.5–4.6)

## 2015-12-07 LAB — MAGNESIUM: MAGNESIUM: 2.3 mg/dL (ref 1.7–2.4)

## 2015-12-07 MED ORDER — ISOSORBIDE MONONITRATE ER 60 MG PO TB24
60.0000 mg | ORAL_TABLET | Freq: Every day | ORAL | Status: DC
  Administered 2015-12-07 – 2015-12-08 (×2): 60 mg via ORAL
  Filled 2015-12-07 (×2): qty 1

## 2015-12-07 MED ORDER — ARFORMOTEROL TARTRATE 15 MCG/2ML IN NEBU
2.0000 mL | INHALATION_SOLUTION | Freq: Two times a day (BID) | RESPIRATORY_TRACT | Status: DC
Start: 2015-12-07 — End: 2015-12-07

## 2015-12-07 MED ORDER — CLONAZEPAM 0.5 MG PO TBDP: 0.5000 mg | ORAL_TABLET | Freq: Two times a day (BID) | ORAL | Status: DC

## 2015-12-07 MED ORDER — METOPROLOL TARTRATE 50 MG PO TABS
50.0000 mg | ORAL_TABLET | Freq: Two times a day (BID) | ORAL | Status: DC
  Administered 2015-12-07 – 2015-12-08 (×3): 50 mg via ORAL
  Filled 2015-12-07: qty 2
  Filled 2015-12-07 (×2): qty 1

## 2015-12-07 MED ORDER — CLONAZEPAM 0.5 MG PO TABS
0.5000 mg | ORAL_TABLET | Freq: Two times a day (BID) | ORAL | Status: DC
  Administered 2015-12-07 – 2015-12-08 (×2): 0.5 mg via ORAL
  Filled 2015-12-07 (×2): qty 1

## 2015-12-07 MED ORDER — ATORVASTATIN CALCIUM 20 MG PO TABS: 20.0000 mg | ORAL_TABLET | Freq: Every day | ORAL | Status: DC

## 2015-12-07 MED ORDER — BUDESONIDE 0.5 MG/2ML IN SUSP: 0.5000 mg | Freq: Two times a day (BID) | RESPIRATORY_TRACT | Status: DC | PRN

## 2015-12-07 MED ORDER — GABAPENTIN 100 MG PO CAPS: 100.0000 mg | ORAL_CAPSULE | Freq: Three times a day (TID) | ORAL | Status: DC

## 2015-12-07 NOTE — Progress Notes (Signed)
Speech Language Pathology Treatment: Dysphagia  Patient Details Name: [name] MRN: [id number] DOB: 03-19-1955 Today's Date: 12/07/2015 Time: 0910-0920 SLP Time Calculation (min) (ACUTE ONLY): 10 min  Assessment / Plan / Recommendation Clinical Impression  Pt demonstrates tolerance of thin liquids consumed consecutively through straw over three oz. No signs of aspiration observed. The pt consumed 100% of breakfast without difficulty masticating, but still struggled with a peanut butter cracker, which she said was stale, though it appeared her poor dentition played a role. Recommend pt upgrade to dys 3 (mech soft) with thin liquids as acute reversible dysphagia appears resolved nd pt is back to baseline function. No SLP f/u needed will sign off.    HPI HPI: [name] is a 60 y.o. female with PMH as outlined below including known COPD (on chronic 5 L O2 and 24/7) as well as history of noncompliance with outpatient follow-up. Pt was intubated on 10/26 and extubated on 10/29.      SLP Plan  Continue with current plan of care     Recommendations  Diet recommendations: Dysphagia 3 (mechanical soft);Thin liquid Liquids provided via: Cup;Straw Medication Administration: Whole meds with puree Supervision: Patient able to self feed                Plan: Continue with current plan of care       GO               [facility], MA CCC-SLP [phone]   Lynann Beaver 12/07/2015, 9:48 AM

## 2015-12-07 NOTE — Progress Notes (Signed)
[facility]                                                                              Patient Demographics  [name], is a 60 y.o. female, DOB - Sep 23, 1955, KDP:[id number]  Admit date - 12/02/2015   Admitting Physician Collene Gobble, MD  Outpatient Primary MD for the patient is No PCP Per Patient  Outpatient specialists:   LOS - 5  days    Chief Complaint  Patient presents with  . Shortness of Breath       Brief summary   [name] a 60 y.o.femalewith known COPD (on chronic 5 L O2 and 24/7) as well as history of noncompliance with outpatient follow-up, brought to [facility] ED 12/02/15 with SOB. In ED, she was in significant respiratory distress for which she was immediately intubated. Patient was admitted to ICU by PCCM.  She was transferred to the floor on 10/30, TR[facility] assumed care on 10/31.   Assessment & Plan    Acute on chronic hypoxic and hypercarbic respiratory failure, COPD - At baseline on 5 L O2 at home - Patient was intubated on admission, extubated on 10/29, currently on 5 L O2, O2 sats 93% -Continue [place], budesonide, duo nebs, Solu-Medrol transitioned to oral prednisone today - Antibiotics discontinued on 10/30 by pulmonology    left hilar mass - Chest x-rays showed possible mass at the left hilum. Recommended CT chest with contrast (CT chest in 2008 had not shown any hilar lymphadenopathy or mass.) - Obtain CT chestwith contrast today   Coronary artery disease - Currently stable, continue Imdur, beta blocker   History of TIA - On xarelto (?) outpatient, will continue  Diabetes mellitus - CBGs currently controlled, continue current insulin regimen   Acute encephalopathy - Resolved, currently at baseline   Code Status: full CODE STATUS  DVT Prophylaxis:  xarelto Family Communication: Discussed in detail with the patient, all imaging results, lab results explained to the patient  Disposition Plan: Hopefully DC next  24-48 hours Time Spent in minutes  25 minutes  Procedures:  Chest x-ray   intubation, extubation  Consultants:   The patient was admitted by pulmonary critical care   Antimicrobials :  Ceftriaxone 10/26 >>>10/30  CULTURES: Blood 10/26 >coag neg staph Repeat blood 10/27 > no growth Sputum 10/26 > Urine 10/27 > no growth  Medications  Scheduled Meds: . arformoterol  15 mcg Nebulization BID  . atorvastatin  20 mg Per Tube q1800  . budesonide (PULMICORT) nebulizer solution  0.5 mg Nebulization BID  . chlorhexidine gluconate (MEDLINE KIT)  15 mL Mouth Rinse BID  . clonazePAM  0.5 mg Oral BID  . fentaNYL  25 mcg Transdermal Q72H  . gabapentin  100 mg Oral TID  . insulin aspart  0-20 Units Subcutaneous Q4H  . insulin glargine  5 Units Subcutaneous BID  . ipratropium-albuterol  3 mL Nebulization Q6H  . isosorbide mononitrate  60 mg Oral Daily  . mouth rinse  15 mL Mouth Rinse QID  . metoprolol  50 mg Oral BID  . multivitamins with iron  1 tablet Oral Daily  . predniSONE  40 mg Oral Q breakfast  . rivaroxaban  20 mg Oral Daily   Continuous Infusions:  PRN Meds:.sodium chloride, ondansetron (ZOFRAN) IV   Antibiotics   Anti-infectives    Start     Dose/Rate Route Frequency Ordered Stop   12/02/15 2200  azithromycin (ZITHROMAX) 500 mg in dextrose 5 % 250 mL IVPB     500 mg 250 mL/hr over 60 Minutes Intravenous  Once 12/02/15 2152 12/03/15 1243   12/02/15 2200  cefTRIAXone (ROCEPHIN) 2 g in dextrose 5 % 50 mL IVPB  Status:  Discontinued     2 g 100 mL/hr over 30 Minutes Intravenous Every 24 hours 12/02/15 2155 12/06/15 1107        Subjective:   [name] was seen and examined today. Feeling somewhat better today, shortness of breath is improving. No chest pain. On 5 L O2 via nasal cannula. No fevers or chills.  Patient denies dizziness, abdominal pain, N/V/D/C, new weakness, numbess, tingling. No acute events overnight.    Objective:   Vitals:   12/06/15  2208 12/07/15 0221 12/07/15 0432 12/07/15 0717  BP: (!) 135/58  132/89   Pulse: 92  (!) 104   Resp: 20  18   Temp: 98.8 F (37.1 C)  97.7 F (36.5 C)   TempSrc: Oral  Oral   SpO2: 93% 97% 94% 93%  Weight:   84.8 kg (187 lb)   Height:        Intake/Output Summary (Last 24 hours) at 12/07/15 1025 Last data filed at 12/07/15 0933  Gross per 24 hour  Intake              382 ml  Output              100 ml  Net              282 ml     Wt Readings from Last 3 Encounters:  12/07/15 84.8 kg (187 lb)  05/26/15 102.8 kg (226 lb 10.1 oz)  09/03/13 103 kg (227 lb)     Exam  General: Alert and oriented x 3, NAD  HEENT:  PERRLA, EOMI, Anicteric Sclera, mucous membranes moist.   Neck: Supple, no JVD, no masses  Cardiovascular: S1 S2 auscultated, sinus tachycardia   Respiratory: Bilaterally scattered expiratory wheezing  Gastrointestinal: Soft, nontender, nondistended, + bowel sounds  Ext: no cyanosis clubbing or edema  Neuro: AAOx3, Cr N's II- XII. Strength 5/5 upper and lower extremities bilaterally  Skin: No rashes  Psych: Normal affect and demeanor, alert and oriented x3    Data Reviewed:  I have personally reviewed following labs and imaging studies  Micro Results Recent Results (from the past 240 hour(s))  Culture, blood (routine x 2)     Status: Abnormal   Collection Time: 12/02/15  9:15 PM  Result Value Ref Range Status   Specimen Description BLOOD RIGHT ANTECUBITAL  Final   Special Requests BOTTLES DRAWN AEROBIC AND ANAEROBIC 5CC  Final   Culture  Setup Time   Final    IN BOTH AEROBIC AND ANAEROBIC BOTTLES GRAM POSITIVE COCCI IN CLUSTERS Gram Stain Report Called to,Read Back By and Verified With: T. DANG, PHARMD AT 2120 ON 12/03/15 BY C. JESSUP, MLT.    Culture (A)  Final    STAPHYLOCOCCUS SPECIES (COAGULASE NEGATIVE) THE SIGNIFICANCE OF ISOLATING THIS ORGANISM FROM A SINGLE SET OF BLOOD CULTURES WHEN MULTIPLE SETS ARE DRAWN IS UNCERTAIN. PLEASE NOTIFY THE  MICROBIOLOGY DEPARTMENT WITHIN ONE WEEK IF SPECIATION  AND SENSITIVITIES ARE REQUIRED.    Report Status 12/05/2015 FINAL  Final  Blood Culture ID Panel (Reflexed)     Status: None   Collection Time: 12/02/15  9:15 PM  Result Value Ref Range Status   Enterococcus species NOT DETECTED NOT DETECTED Final   Listeria monocytogenes NOT DETECTED NOT DETECTED Final   Staphylococcus species NOT DETECTED NOT DETECTED Final   Staphylococcus aureus NOT DETECTED NOT DETECTED Final   Streptococcus species NOT DETECTED NOT DETECTED Final   Streptococcus agalactiae NOT DETECTED NOT DETECTED Final   Streptococcus pneumoniae NOT DETECTED NOT DETECTED Final   Streptococcus pyogenes NOT DETECTED NOT DETECTED Final   Acinetobacter baumannii NOT DETECTED NOT DETECTED Final   Enterobacteriaceae species NOT DETECTED NOT DETECTED Final   Enterobacter cloacae complex NOT DETECTED NOT DETECTED Final   Escherichia coli NOT DETECTED NOT DETECTED Final   Klebsiella oxytoca NOT DETECTED NOT DETECTED Final   Klebsiella pneumoniae NOT DETECTED NOT DETECTED Final   Proteus species NOT DETECTED NOT DETECTED Final   Serratia marcescens NOT DETECTED NOT DETECTED Final   Haemophilus influenzae NOT DETECTED NOT DETECTED Final   Neisseria meningitidis NOT DETECTED NOT DETECTED Final   Pseudomonas aeruginosa NOT DETECTED NOT DETECTED Final   Candida albicans NOT DETECTED NOT DETECTED Final   Candida glabrata NOT DETECTED NOT DETECTED Final   Candida krusei NOT DETECTED NOT DETECTED Final   Candida parapsilosis NOT DETECTED NOT DETECTED Final   Candida tropicalis NOT DETECTED NOT DETECTED Final  Culture, blood (routine x 2)     Status: None (Preliminary result)   Collection Time: 12/02/15 10:21 PM  Result Value Ref Range Status   Specimen Description BLOOD LEFT HAND  Final   Special Requests IN PEDIATRIC BOTTLE 1CC  Final   Culture NO GROWTH 4 DAYS  Final   Report Status PENDING  Incomplete  MRSA PCR Screening     Status:  None   Collection Time: 12/03/15 12:47 AM  Result Value Ref Range Status   MRSA by PCR NEGATIVE NEGATIVE Final    Comment:        The GeneXpert MRSA Assay (FDA approved for NASAL specimens only), is one component of a comprehensive MRSA colonization surveillance program. It is not intended to diagnose MRSA infection nor to guide or monitor treatment for MRSA infections.   Culture, blood (routine x 2)     Status: None (Preliminary result)   Collection Time: 12/03/15  1:39 AM  Result Value Ref Range Status   Specimen Description BLOOD LEFT ARM  Final   Special Requests BOTTLES DRAWN AEROBIC AND ANAEROBIC 4ML  Final   Culture NO GROWTH 3 DAYS  Final   Report Status PENDING  Incomplete  Culture, blood (routine x 2)     Status: None (Preliminary result)   Collection Time: 12/03/15  1:54 AM  Result Value Ref Range Status   Specimen Description BLOOD RIGHT HAND  Final   Special Requests IN PEDIATRIC BOTTLE 3ML  Final   Culture NO GROWTH 3 DAYS  Final   Report Status PENDING  Incomplete  Culture, Urine     Status: None   Collection Time: 12/03/15  9:28 AM  Result Value Ref Range Status   Specimen Description URINE, CATHETERIZED  Final   Special Requests NONE  Final   Culture NO GROWTH  Final   Report Status 12/04/2015 FINAL  Final    Radiology Reports Dg Chest Port 1 View  Result Date: 12/05/2015 CLINICAL DATA:  Respiratory difficulty EXAM:  PORTABLE CHEST 1 VIEW COMPARISON:  Yesterday FINDINGS: Tubular device is stable. Left suprahilar density stable. Chronic interstitial changes stable. Mild cardiomegaly. IMPRESSION: Stable left suprahilar density. Electronically Signed   By: Marybelle Killings [name]   On: 12/05/2015 09:01   Dg Chest Port 1 View  Result Date: 12/04/2015 CLINICAL DATA:  Acute exacerbation of COPD.  Intubation. EXAM: PORTABLE CHEST 1 VIEW COMPARISON:  12/03/2015 and 12/02/2015 FINDINGS: Endotracheal tube in good position. NG tube tip is below the diaphragm. There is a  2.5 cm ill-defined density at the superior lateral aspect of the left hilum which may be mass. The infiltrates and effusions and pulmonary edema have resolved. Heart size and pulmonary vascularity are normal. IMPRESSION: Clearing of the infiltrates and pulmonary edema. Possible mass at the left hilum. CT scan with contrast recommended for further evaluation. Electronically Signed   By: Lorriane Shire [name]   On: 12/04/2015 09:01   Dg Chest Port 1 View  Result Date: 12/03/2015 CLINICAL DATA:  60 year old female with oral gastric tube placement. EXAM: PORTABLE ABDOMEN - 1 VIEW; PORTABLE CHEST - 1 VIEW COMPARISON:  Chest radiograph dated 12/02/2015 FINDINGS: Endotracheal tube with tip approximately 4 cm above the carina in stable positioning. There has been interval placement of an enteric tube with sideport in the distal third of the esophagus and the tip at the level of the diaphragm and gastroesophageal junction. Recommend advancing of the tube into the stomach. There is emphysematous changes of the lungs with interstitial coarsening. No focal consolidation, pleural effusion, or pneumothorax. Mild cardiomegaly. Median sternotomy wires and CABG vascular clips noted. No acute osseous pathology. There is moderate stool in the visualized portions of the colon. Air-filled loops of bowel in the mid abdomen are not well evaluated. IMPRESSION: Interval placement of an endotracheal tube with tip at the gastroesophageal junction. Recommend advancing the tube into the stomach. Electronically Signed   By: Anner Crete [name]   On: 12/03/2015 05:44   Dg Chest Portable 1 View  Result Date: 12/02/2015 CLINICAL DATA:  60 y/o  F; endotracheal tube. EXAM: PORTABLE CHEST 1 VIEW COMPARISON:  05/25/2015 chest radiograph FINDINGS: Normal cardiac silhouette given projection and technique. Post median sternotomy with wires and alignment. Endotracheal tube is 3.4 cm from the carina. Clear lungs. No pneumothorax. No pleural  effusion. Post CABG. Bones are unremarkable. IMPRESSION: No active disease. Endotracheal tube approximately 3.4 cm from the carina. Electronically Signed   By: Kristine Garbe [name]   On: 12/02/2015 22:23   Dg Abd Portable 1v  Result Date: 12/03/2015 CLINICAL DATA:  60 year old female with oral gastric tube placement. EXAM: PORTABLE ABDOMEN - 1 VIEW; PORTABLE CHEST - 1 VIEW COMPARISON:  Chest radiograph dated 12/02/2015 FINDINGS: Endotracheal tube with tip approximately 4 cm above the carina in stable positioning. There has been interval placement of an enteric tube with sideport in the distal third of the esophagus and the tip at the level of the diaphragm and gastroesophageal junction. Recommend advancing of the tube into the stomach. There is emphysematous changes of the lungs with interstitial coarsening. No focal consolidation, pleural effusion, or pneumothorax. Mild cardiomegaly. Median sternotomy wires and CABG vascular clips noted. No acute osseous pathology. There is moderate stool in the visualized portions of the colon. Air-filled loops of bowel in the mid abdomen are not well evaluated. IMPRESSION: Interval placement of an endotracheal tube with tip at the gastroesophageal junction. Recommend advancing the tube into the stomach. Electronically Signed   By: Laren Everts.D.  On: 12/03/2015 05:44    Lab Data:  CBC:  Recent Labs Lab 12/02/15 2116 12/03/15 0454 12/04/15 0520 12/05/15 0531 12/06/15 0216 12/07/15 0402  WBC 9.9 8.6 12.8* 13.3* 14.2* 12.0*  NEUTROABS 8.1*  --   --   --   --   --   HGB 14.0 12.2 12.0 13.1 14.7 14.5  HCT 51.2* 44.0 41.5 44.2 48.9* 49.8*  MCV 104.9* 102.3* 96.3 94.6 96.6 97.6  PLT 190 165 148* 149* 141* 009*   Basic Metabolic Panel:  Recent Labs Lab 12/03/15 0454  12/04/15 0520 12/04/15 1657 12/05/15 0531 12/06/15 0216 12/07/15 0402  NA 140  --  140  --  137 141 136  K 3.7  --  3.7  --  4.2 4.7 4.4  CL 92*  --  88*  --  86* 82*  84*  CO2 38*  --  41*  --  38* 44* 40*  GLUCOSE 267*  --  231*  --  276* 191* 84  BUN 23*  --  26*  --  40* 44* 36*  CREATININE 0.98  --  0.70  --  0.95 0.89 0.76  CALCIUM 8.7*  --  8.5*  --  9.3 9.9 9.7  MG 1.7  < > 2.1 2.1 2.2 2.4 2.3  PHOS <1.0*  < > 2.9 3.5 3.8 7.4* 4.3  < > = values in this interval not displayed. GFR: Estimated Creatinine Clearance: 80 mL/min (by C-G formula based on SCr of 0.76 mg/dL). Liver Function Tests:  Recent Labs Lab 12/02/15 2116  AST 34  ALT 17  ALKPHOS 75  BILITOT 1.8*  PROT 7.1  ALBUMIN 3.3*   No results for input(s): LIPASE, AMYLASE in the last 168 hours. No results for input(s): AMMONIA in the last 168 hours. Coagulation Profile: No results for input(s): INR, PROTIME in the last 168 hours. Cardiac Enzymes:  Recent Labs Lab 12/02/15 2116  TROPONINI <0.03   BNP (last 3 results) No results for input(s): PROBNP in the last 8760 hours. HbA1C: No results for input(s): HGBA1C in the last 72 hours. CBG:  Recent Labs Lab 12/06/15 1638 12/06/15 2013 12/07/15 0012 12/07/15 0427 12/07/15 0748  GLUCAP 181* 259* 153* 105* 102*   Lipid Profile:  Recent Labs  12/05/15 0531  TRIG 164*   Thyroid Function Tests: No results for input(s): TSH, T4TOTAL, FREET4, T3FREE, THYROIDAB in the last 72 hours. Anemia Panel: No results for input(s): VITAMINB12, FOLATE, FERRITIN, TIBC, IRON, RETICCTPCT in the last 72 hours. Urine analysis:    Component Value Date/Time   COLORURINE AMBER (A) 12/03/2015 0247   APPEARANCEUR TURBID (A) 12/03/2015 0247   LABSPEC 1.021 12/03/2015 0247   PHURINE 6.0 12/03/2015 0247   GLUCOSEU NEGATIVE 12/03/2015 0247   HGBUR NEGATIVE 12/03/2015 0247   BILIRUBINUR MODERATE (A) 12/03/2015 0247   KETONESUR 15 (A) 12/03/2015 0247   PROTEINUR 30 (A) 12/03/2015 0247   NITRITE NEGATIVE 12/03/2015 0247   LEUKOCYTESUR LARGE (A) 12/03/2015 0247     Terris Germano [name] [facility] 12/07/2015, 10:25 AM  Pager:  [phone] Between 7am to 7pm - call Pager - [phone]  After 7pm go to [email or web address] - password [email or web address]  Call night coverage person covering after 7pm

## 2015-12-07 NOTE — Progress Notes (Signed)
Patient and her husband refused CT due to claustrophobia. MD made aware.

## 2015-12-07 NOTE — Progress Notes (Signed)
Nutrition Follow-up  DOCUMENTATION CODES:   Obesity unspecified  INTERVENTION:   -Continue with dysphagia 3 diet  NUTRITION DIAGNOSIS:   Increased nutrient needs related to  (stage III wound) as evidenced by estimated needs.  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 Pt also with stage III wound from MASD per [facility] RN which was present on admission. Sleeps in recliner at home and is incontinet.    Pt extubated on 12/05/15. Pt transferred from ICU to medical floor on 12/06/15.   Pt underwent BSE and was advanced to a dysphagia 2 diet with thin liquids. Pt upgraded to dysphagia 3 diet after RD visit.   Pt reports her appetite is coming back and her swallow function is improving. Per pt husband, pt did not eat well for 4 days PTA due to illness. Both deny that pt has experienced wt loss. Per pt, "this was the first good meal I had to day". PO 50-100% per doc flowsheet records.   Reviewed CWOCN note from 12/03/15; pt with moisture associated skin damage on buttocks.   Discussed importance of good meal intake to promote healing. Pt declined offer of snacks and supplements. Pt is very eager to return home.   Labs reviewed: CBGS: [id number].   Diet Order:  DIET DYS 3 Room service appropriate? Yes; Fluid consistency: Thin  Skin:  Reviewed, no issues (MASD on buttocks)  Last BM:  12/03/15  Height:   Ht Readings from Last 1 Encounters:  12/03/15 '5\' 6"'$  (1.676 m)    Weight:   Wt Readings from Last 1 Encounters:  12/07/15 187 lb (84.8 kg)    Ideal Body Weight:  59 kg  BMI:  Body mass index is 30.18 kg/m.  Estimated Nutritional  Needs:   Kcal:  1700-1900  Protein:  85-100 grams  Fluid:  1.7-1.9 L  EDUCATION NEEDS:   Education needs addressed  Mahreen Schewe A. Jimmye Norman, RD, LDN, CDE Pager: [phone] After hours Pager: [phone]

## 2015-12-07 NOTE — Progress Notes (Signed)
Pharmacy - COPD medications  I spoke with patient and husband. Patient reports not going to follow-up visits if not feeling well. She reports using albuterol and Combivent on a regular basis at home. Her medication list also includes arformoterol neb bid although husband reports she does not get enough of this to last an entire month; also budesonide neb prn.  Discussed with them what rescue medications vs maintenance medications are for COPD. Explained albuterol and Combivent should be used for acute SOB and that she needs to take maintenance neb or inhaler to avoid COPD exacerbations - these are the arformoterol and budesonide nebs.   She prefers to use an inhaler vs neb treatments though. She could not give me names of any inhalers that worked in the past. I requested Case Management do a benefits check for inhaled ICS/LABA such as Symbicort, Advair, Dulera, and Breo.   No PFTs available. Unable to stage COPD without PFTs but suspect she needs a LAMA + ICS/LABA for maintenance therapy.  Plan: - Consider discharge on LAMA like Spiriva or Incruse - Follow-up with Case Management to see which ICS/LABA is covered by insurance and prescribe for discharge - Consider pulmonary rehab consult although has hx noncompliance   Renold Genta, PharmD, BCPS Clinical Pharmacist Phone for today - [place] - [phone] 12/07/2015 11:27 AM

## 2015-12-07 NOTE — Care Management Note (Addendum)
Case Management Note  Patient Details  Name: [name] MRN: [id number] Date of Birth: 09-04-1955  Subjective/Objective:            Spoke with patient and husband at the bedside. They state they have had [place] in the past and it has been too expensive. CM running check on cost. Patient has home O2 through [place]. Has home WC and BSC, deny need for additional DME. 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         Action/Plan:  Copay of $0 confirmed with [facility]. Referral made to clinical liaison Butch Penny, patient updated.  Expected Discharge Date:                  Expected Discharge Plan:  Home/Self Care  In-House Referral:  NA  Discharge planning Services  CM Consult  Post Acute Care Choice:  NA Choice offered to:     DME Arranged:    DME Agency:     HH Arranged:    HH Agency:     Status of Service:  In process, will continue to follow  If discussed at Long Length of Stay Meetings, dates discussed:    Additional Comments:  Carles Collet, RN 12/07/2015, 12:02 PM

## 2015-12-08 DIAGNOSIS — I10 Essential (primary) hypertension: Secondary | ICD-10-CM

## 2015-12-08 LAB — CULTURE, BLOOD (ROUTINE X 2)
Culture: NO GROWTH
Culture: NO GROWTH

## 2015-12-08 LAB — CBC
HCT: 48.7 % — ABNORMAL HIGH (ref 36.0–46.0)
Hemoglobin: 14.6 g/dL (ref 12.0–15.0)
MCH: 28.5 pg (ref 26.0–34.0)
MCHC: 30 g/dL (ref 30.0–36.0)
MCV: 94.9 fL (ref 78.0–100.0)
PLATELETS: 157 10*3/uL (ref 150–400)
RBC: 5.13 MIL/uL — ABNORMAL HIGH (ref 3.87–5.11)
RDW: 14.3 % (ref 11.5–15.5)
WBC: 13.7 10*3/uL — AB (ref 4.0–10.5)

## 2015-12-08 LAB — GLUCOSE, CAPILLARY
GLUCOSE-CAPILLARY: 247 mg/dL — AB (ref 65–99)
Glucose-Capillary: 134 mg/dL — ABNORMAL HIGH (ref 65–99)
Glucose-Capillary: 91 mg/dL (ref 65–99)

## 2015-12-08 LAB — BASIC METABOLIC PANEL
ANION GAP: 11 (ref 5–15)
BUN: 30 mg/dL — ABNORMAL HIGH (ref 6–20)
CALCIUM: 9.3 mg/dL (ref 8.9–10.3)
CO2: 38 mmol/L — ABNORMAL HIGH (ref 22–32)
CREATININE: 0.74 mg/dL (ref 0.44–1.00)
Chloride: 86 mmol/L — ABNORMAL LOW (ref 101–111)
GLUCOSE: 96 mg/dL (ref 65–99)
Potassium: 4.5 mmol/L (ref 3.5–5.1)
Sodium: 135 mmol/L (ref 135–145)

## 2015-12-08 MED ORDER — BISACODYL 5 MG PO TBEC: 5.0000 mg | DELAYED_RELEASE_TABLET | Freq: Every day | ORAL | Status: DC | PRN

## 2015-12-08 MED ORDER — PREDNISONE 20 MG PO TABS: 20.0000 mg | ORAL_TABLET | Freq: Every day | ORAL | 0 refills | Status: DC

## 2015-12-08 MED ORDER — FLUTICASONE-SALMETEROL 250-50 MCG/DOSE IN AEPB: 1.0000 | INHALATION_SPRAY | Freq: Two times a day (BID) | RESPIRATORY_TRACT | 0 refills | Status: DC

## 2015-12-08 MED ORDER — ACETAMINOPHEN 325 MG PO TABS
650.0000 mg | ORAL_TABLET | Freq: Four times a day (QID) | ORAL | Status: DC | PRN
  Administered 2015-12-08: 650 mg via ORAL
  Filled 2015-12-08: qty 2

## 2015-12-08 NOTE — Progress Notes (Signed)
Pharmacy - COPD medications  Spoke with Case Management and insurance will cover Advair, Dulera or Breo at $0 copay.  - Consider discharge on one of these products + Spiriva + albuterol inhaler prn - Recommend update discharge med list and discontinue budesonide, arformoterol to avoid confusion for the patient if the above done  Renold Genta, PharmD, BCPS Clinical Pharmacist Phone for today - [place] - [phone] 12/08/2015 11:16 AM

## 2015-12-08 NOTE — Progress Notes (Signed)
Per CMA  benefit check yielded Advair, Dulera, and Breo with $0 copay.

## 2015-12-08 NOTE — Discharge Summary (Signed)
Physician Discharge Summary  [name] ACZ:[id number] DOB: 27-Feb-1955 DOA: 12/02/2015  PCP: No PCP Per Patient  Admit date: 12/02/2015 Discharge date: 12/08/2015  Admitted From:  Home Disposition:   Home   Recommendations for Outpatient Follow-up:  1. Follow up with PCP in 1- week 2. Patient will need out patient CT chest with contrast for a possible left hilar mass (left suprahilar density)  Home Health: Yes  Equipment/Devices: home oxygent, walker.    Discharge Condition: Stable   CODE STATUS: Full  Diet recommendation: Heart Healthy   Brief/Interim Summary: This is a 60 year old female who presented to hospital with chief complaint of dyspnea. Patient was encephalopathic at the time of initial evaluation. History obtained from chart review and from her husband who was at bedside. Apparently from last 2 weeks prior to hospitalization patient had been experiencing worsening dyspnea, being more severe over last 2 to 3 days to the point where she was dyspneic with minimal activity. On initial physical examination she was noted to be on significant respiratory distress, she was immediately intubated. Blood pressure was 121/89, heart rate 93, temperature 99.3, oxygen saturation 98% on mechanical ventilation. She was placed on PRVC with 60% FiO2, respiratory rate 24, tidal volume 480 and PEEP of 5, rate 24 bpm. patient was agitated on the ventilator, heart S1-S2 present regular rhythmic tachycardic, her respirations were unlabored, breath sounds were distant with bilateral wheezing, abdomen soft, extremities, with no edema. Sitting 142, potassium 5.6, BNP 20, creatinine 0.73, glucose 159, troponin 0.03, white count 9.9, hemoglobin 14, hematocrit 51, platelets 190, lactic acid 2.4, BNP 184. Arterial blood gas with a pH 7.35, PCO2 85, PaO2 43, oxygen saturation 72%. Chest x-ray showed hyperinflation and no defined infiltrate. Her EKG was sinus tachycardia.  Patient was admitted to the intensive  care unit due to severe hypoxic respiratory failure due to COPD exacerbation, complicated by metabolic encephalopathy.  1. Hypoxic respiratory failure. Patient was placed on invasive mechanical ventilation. Apparently patient is very claustrophobic and she had been very specific about not wanting to use any mask to support her breathing. Patient was placed on systemic steroids, antibiotic therapy, bronchodilator therapy. Patient was successfully liberated from mechanical ventilation on October 30th. Transfered to medical floor. Initially she was on high flow nasal cannula then wean to 5 L of supplemental oxygen per nasal cannula, with an oxygen saturation of 97% at discahrge. She'll continue prednisone taper. Antibiotics were discontinued. Pneumonia was ruled out. Patient on 5 L on supplemental oxygen at home. Patient will be placed on inhaled corticosteroid and beta-2 agonist long-acting, albuterol, and Spiriva. Home PT for pulmonary rehabilitation.  2. Metabolic encephalopathy. Due to hypoxemia. Patient mentation improved. By the time of discharge she is back to her baseline. No confusion or agitation.  3. Left hilar mass. Imaging of the chest showed a possible mass in the left hilar region, patient is not willing to proceed with CT scan due to claustrophobia. She has been advised about the need of the study and she will reattempt getting imaging as an outpatient.   4. Type 2 diabetes mellitus. Patient was placed on insulin sliding scale, glargine, Glucose 220, 181, 203. Tolerating po diet.   5. HTN. Patient will continue on isosorbide, metoprolol for blood pressure control.  6. History of TIA, patient has been on Xarelto that will be continued.   Discharge Diagnoses:  Active Problems:   COPD exacerbation ([place])   Pressure injury of skin   Encounter for orogastric (OG) tube placement  Discharge Instructions     Medication List    STOP taking these medications   arformoterol 15 MCG/2ML  [place] Commonly known as:  BROVANA   budesonide 0.5 MG/2ML nebulizer solution Commonly known as:  PULMICORT   pantoprazole 40 MG tablet Commonly known as:  PROTONIX     TAKE these medications   albuterol (2.5 MG/3ML) 0.083% nebulizer solution Commonly known as:  PROVENTIL Take 3 mLs (2.5 mg total) by nebulization every 3 (three) hours as needed for wheezing.   atorvastatin 20 MG tablet Commonly known as:  LIPITOR Take 1 tablet (20 mg total) by mouth daily.   Fluticasone-Salmeterol 250-50 MCG/DOSE Aepb Commonly known as:  ADVAIR DISKUS Inhale 1 puff into the lungs 2 (two) times daily.   gabapentin 100 MG capsule Commonly known as:  NEURONTIN Take 100 mg by mouth 3 (three) times daily.   insulin aspart 100 UNIT/ML FlexPen Commonly known as:  NOVOLOG FLEXPEN Inject 6 Units into the skin 3 (three) times daily with meals. What changed:  how much to take   Ipratropium-Albuterol 20-100 MCG/ACT Aers respimat Commonly known as:  COMBIVENT RESPIMAT Inhale 1-2 puffs into the lungs every 4 (four) hours as needed for wheezing.   isosorbide mononitrate 60 MG 24 hr tablet Commonly known as:  IMDUR Take 1 tablet (60 mg total) by mouth daily.   metoprolol 50 MG tablet Commonly known as:  LOPRESSOR Take 1 tablet (50 mg total) by mouth 2 (two) times daily.   morphine CONCENTRATE 10 MG/0.5ML Soln concentrated solution Take 0.25 mLs (5 mg total) by mouth every 3 (three) hours as needed for shortness of breath (dyspnea).   predniSONE 20 MG tablet Commonly known as:  DELTASONE Take 1 tablet (20 mg total) by mouth daily with breakfast. Take twice daily for 3 days, then take daily for 3 days. Start taking on:  12/09/2015 What changed:  medication strength  how much to take  how to take this  when to take this  additional instructions   rivaroxaban 20 MG Tabs tablet Commonly known as:  XARELTO Take 1 tablet (20 mg total) by mouth daily.   TRESIBA FLEXTOUCH 100 UNIT/ML Sopn  FlexTouch Pen Generic drug:  insulin degludec Inject 20 Units into the skin daily.      Follow-up Information    [facility] .   Why:  for home health PT Contact information: [address] [place] [address] [phone]        Primary care Follow up in 1 week(s).          Allergies  Allergen Reactions  . Ciprofloxacin Rash    REACTION: hives/rash    Consultations:  Pulmonary    Procedures/Studies: Dg Chest Port 1 View  Result Date: 12/05/2015 CLINICAL DATA:  Respiratory difficulty EXAM: PORTABLE CHEST 1 VIEW COMPARISON:  Yesterday FINDINGS: Tubular device is stable. Left suprahilar density stable. Chronic interstitial changes stable. Mild cardiomegaly. IMPRESSION: Stable left suprahilar density. Electronically Signed   By: Marybelle Killings [name]   On: 12/05/2015 09:01   Dg Chest Port 1 View  Result Date: 12/04/2015 CLINICAL DATA:  Acute exacerbation of COPD.  Intubation. EXAM: PORTABLE CHEST 1 VIEW COMPARISON:  12/03/2015 and 12/02/2015 FINDINGS: Endotracheal tube in good position. NG tube tip is below the diaphragm. There is a 2.5 cm ill-defined density at the superior lateral aspect of the left hilum which may be mass. The infiltrates and effusions and pulmonary edema have resolved. Heart size and pulmonary vascularity are normal.  IMPRESSION: Clearing of the infiltrates and pulmonary edema. Possible mass at the left hilum. CT scan with contrast recommended for further evaluation. Electronically Signed   By: Lorriane Shire [name]   On: 12/04/2015 09:01   Dg Chest Port 1 View  Result Date: 12/03/2015 CLINICAL DATA:  60 year old female with oral gastric tube placement. EXAM: PORTABLE ABDOMEN - 1 VIEW; PORTABLE CHEST - 1 VIEW COMPARISON:  Chest radiograph dated 12/02/2015 FINDINGS: Endotracheal tube with tip approximately 4 cm above the carina in stable positioning. There has been interval placement of an enteric tube with sideport in the distal  third of the esophagus and the tip at the level of the diaphragm and gastroesophageal junction. Recommend advancing of the tube into the stomach. There is emphysematous changes of the lungs with interstitial coarsening. No focal consolidation, pleural effusion, or pneumothorax. Mild cardiomegaly. Median sternotomy wires and CABG vascular clips noted. No acute osseous pathology. There is moderate stool in the visualized portions of the colon. Air-filled loops of bowel in the mid abdomen are not well evaluated. IMPRESSION: Interval placement of an endotracheal tube with tip at the gastroesophageal junction. Recommend advancing the tube into the stomach. Electronically Signed   By: Anner Crete [name]   On: 12/03/2015 05:44   Dg Chest Portable 1 View  Result Date: 12/02/2015 CLINICAL DATA:  60 y/o  F; endotracheal tube. EXAM: PORTABLE CHEST 1 VIEW COMPARISON:  05/25/2015 chest radiograph FINDINGS: Normal cardiac silhouette given projection and technique. Post median sternotomy with wires and alignment. Endotracheal tube is 3.4 cm from the carina. Clear lungs. No pneumothorax. No pleural effusion. Post CABG. Bones are unremarkable. IMPRESSION: No active disease. Endotracheal tube approximately 3.4 cm from the carina. Electronically Signed   By: Kristine Garbe [name]   On: 12/02/2015 22:23   Dg Abd Portable 1v  Result Date: 12/03/2015 CLINICAL DATA:  60 year old female with oral gastric tube placement. EXAM: PORTABLE ABDOMEN - 1 VIEW; PORTABLE CHEST - 1 VIEW COMPARISON:  Chest radiograph dated 12/02/2015 FINDINGS: Endotracheal tube with tip approximately 4 cm above the carina in stable positioning. There has been interval placement of an enteric tube with sideport in the distal third of the esophagus and the tip at the level of the diaphragm and gastroesophageal junction. Recommend advancing of the tube into the stomach. There is emphysematous changes of the lungs with interstitial coarsening. No  focal consolidation, pleural effusion, or pneumothorax. Mild cardiomegaly. Median sternotomy wires and CABG vascular clips noted. No acute osseous pathology. There is moderate stool in the visualized portions of the colon. Air-filled loops of bowel in the mid abdomen are not well evaluated. IMPRESSION: Interval placement of an endotracheal tube with tip at the gastroesophageal junction. Recommend advancing the tube into the stomach. Electronically Signed   By: Anner Crete [name]   On: 12/03/2015 05:44        Subjective: Patient feeling back to her baseline, dyspnea has improved. Positive constipation, no nausea or vomiting.   Discharge Exam: Vitals:   12/08/15 1121 12/08/15 1127  BP: 114/65 (!) 109/59  Pulse: 83 81  Resp:  18  Temp:  97.9 F (36.6 C)   Vitals:   12/08/15 0742 12/08/15 0756 12/08/15 1121 12/08/15 1127  BP:  (!) 126/104 114/65 (!) 109/59  Pulse:  75 83 81  Resp:  18  18  Temp:  98.1 F (36.7 C)  97.9 F (36.6 C)  TempSrc:  Oral  Oral  SpO2: 97% 97%  96%  Weight:  Height:        General: Pt is alert, awake, not in acute distress Cardiovascular: RRR, S1/S2 +, no rubs, no gallops Respiratory: CTA bilaterally, no wheezing, no rhonchi, decrease ventilation, poor air movement, scattered rales bilaterally.  Abdominal: Soft, NT, ND, bowel sounds + Extremities: no edema, no cyanosis    The results of significant diagnostics from this hospitalization (including imaging, microbiology, ancillary and laboratory) are listed below for reference.     Microbiology: Recent Results (from the past 240 hour(s))  Culture, blood (routine x 2)     Status: Abnormal   Collection Time: 12/02/15  9:15 PM  Result Value Ref Range Status   Specimen Description BLOOD RIGHT ANTECUBITAL  Final   Special Requests BOTTLES DRAWN AEROBIC AND ANAEROBIC 5CC  Final   Culture  Setup Time   Final    IN BOTH AEROBIC AND ANAEROBIC BOTTLES GRAM POSITIVE COCCI IN CLUSTERS Gram Stain  Report Called to,Read Back By and Verified With: T. DANG, PHARMD AT 2120 ON 12/03/15 BY C. JESSUP, MLT.    Culture (A)  Final    STAPHYLOCOCCUS SPECIES (COAGULASE NEGATIVE) THE SIGNIFICANCE OF ISOLATING THIS ORGANISM FROM A SINGLE SET OF BLOOD CULTURES WHEN MULTIPLE SETS ARE DRAWN IS UNCERTAIN. PLEASE NOTIFY THE MICROBIOLOGY DEPARTMENT WITHIN ONE WEEK IF SPECIATION AND SENSITIVITIES ARE REQUIRED.    Report Status 12/05/2015 FINAL  Final  Blood Culture ID Panel (Reflexed)     Status: None   Collection Time: 12/02/15  9:15 PM  Result Value Ref Range Status   Enterococcus species NOT DETECTED NOT DETECTED Final   Listeria monocytogenes NOT DETECTED NOT DETECTED Final   Staphylococcus species NOT DETECTED NOT DETECTED Final   Staphylococcus aureus NOT DETECTED NOT DETECTED Final   Streptococcus species NOT DETECTED NOT DETECTED Final   Streptococcus agalactiae NOT DETECTED NOT DETECTED Final   Streptococcus pneumoniae NOT DETECTED NOT DETECTED Final   Streptococcus pyogenes NOT DETECTED NOT DETECTED Final   Acinetobacter baumannii NOT DETECTED NOT DETECTED Final   Enterobacteriaceae species NOT DETECTED NOT DETECTED Final   Enterobacter cloacae complex NOT DETECTED NOT DETECTED Final   Escherichia coli NOT DETECTED NOT DETECTED Final   Klebsiella oxytoca NOT DETECTED NOT DETECTED Final   Klebsiella pneumoniae NOT DETECTED NOT DETECTED Final   Proteus species NOT DETECTED NOT DETECTED Final   Serratia marcescens NOT DETECTED NOT DETECTED Final   Haemophilus influenzae NOT DETECTED NOT DETECTED Final   Neisseria meningitidis NOT DETECTED NOT DETECTED Final   Pseudomonas aeruginosa NOT DETECTED NOT DETECTED Final   Candida albicans NOT DETECTED NOT DETECTED Final   Candida glabrata NOT DETECTED NOT DETECTED Final   Candida krusei NOT DETECTED NOT DETECTED Final   Candida parapsilosis NOT DETECTED NOT DETECTED Final   Candida tropicalis NOT DETECTED NOT DETECTED Final  Culture, blood  (routine x 2)     Status: None   Collection Time: 12/02/15 10:21 PM  Result Value Ref Range Status   Specimen Description BLOOD LEFT HAND  Final   Special Requests IN PEDIATRIC BOTTLE 1CC  Final   Culture NO GROWTH 5 DAYS  Final   Report Status 12/07/2015 FINAL  Final  MRSA PCR Screening     Status: None   Collection Time: 12/03/15 12:47 AM  Result Value Ref Range Status   MRSA by PCR NEGATIVE NEGATIVE Final    Comment:        The GeneXpert MRSA Assay (FDA approved for NASAL specimens only), is one component of a comprehensive MRSA  colonization surveillance program. It is not intended to diagnose MRSA infection nor to guide or monitor treatment for MRSA infections.   Culture, blood (routine x 2)     Status: None (Preliminary result)   Collection Time: 12/03/15  1:39 AM  Result Value Ref Range Status   Specimen Description BLOOD LEFT ARM  Final   Special Requests BOTTLES DRAWN AEROBIC AND ANAEROBIC 4ML  Final   Culture NO GROWTH 4 DAYS  Final   Report Status PENDING  Incomplete  Culture, blood (routine x 2)     Status: None (Preliminary result)   Collection Time: 12/03/15  1:54 AM  Result Value Ref Range Status   Specimen Description BLOOD RIGHT HAND  Final   Special Requests IN PEDIATRIC BOTTLE 3ML  Final   Culture NO GROWTH 4 DAYS  Final   Report Status PENDING  Incomplete  Culture, Urine     Status: None   Collection Time: 12/03/15  9:28 AM  Result Value Ref Range Status   Specimen Description URINE, CATHETERIZED  Final   Special Requests NONE  Final   Culture NO GROWTH  Final   Report Status 12/04/2015 FINAL  Final     Labs: BNP (last 3 results)  Recent Labs  05/21/15 2151 12/02/15 2118  BNP 193.2* 254.2*   Basic Metabolic Panel:  Recent Labs Lab 12/04/15 0520 12/04/15 1657 12/05/15 0531 12/06/15 0216 12/07/15 0402 12/08/15 0600  NA 140  --  137 141 136 135  K 3.7  --  4.2 4.7 4.4 4.5  CL 88*  --  86* 82* 84* 86*  CO2 41*  --  38* 44* 40* 38*   GLUCOSE 231*  --  276* 191* 84 96  BUN 26*  --  40* 44* 36* 30*  CREATININE 0.70  --  0.95 0.89 0.76 0.74  CALCIUM 8.5*  --  9.3 9.9 9.7 9.3  MG 2.1 2.1 2.2 2.4 2.3  --   PHOS 2.9 3.5 3.8 7.4* 4.3  --    Liver Function Tests:  Recent Labs Lab 12/02/15 2116  AST 34  ALT 17  ALKPHOS 75  BILITOT 1.8*  PROT 7.1  ALBUMIN 3.3*   No results for input(s): LIPASE, AMYLASE in the last 168 hours. No results for input(s): AMMONIA in the last 168 hours. CBC:  Recent Labs Lab 12/02/15 2116  12/04/15 0520 12/05/15 0531 12/06/15 0216 12/07/15 0402 12/08/15 0600  WBC 9.9  < > 12.8* 13.3* 14.2* 12.0* 13.7*  NEUTROABS 8.1*  --   --   --   --   --   --   HGB 14.0  < > 12.0 13.1 14.7 14.5 14.6  HCT 51.2*  < > 41.5 44.2 48.9* 49.8* 48.7*  MCV 104.9*  < > 96.3 94.6 96.6 97.6 94.9  PLT 190  < > 148* 149* 141* 140* 157  < > = values in this interval not displayed. Cardiac Enzymes:  Recent Labs Lab 12/02/15 2116  TROPONINI <0.03   BNP: Invalid input(s): POCBNP CBG:  Recent Labs Lab 12/07/15 1706 12/07/15 2252 12/08/15 0315 12/08/15 0838 12/08/15 1149  GLUCAP 352* 264* 134* 91 247*   D-Dimer No results for input(s): DDIMER in the last 72 hours. Hgb A1c No results for input(s): HGBA1C in the last 72 hours. Lipid Profile No results for input(s): CHOL, HDL, LDLCALC, TRIG, CHOLHDL, LDLDIRECT in the last 72 hours. Thyroid function studies No results for input(s): TSH, T4TOTAL, T3FREE, THYROIDAB in the last 72 hours.  Invalid  input(s): FREET3 Anemia work up No results for input(s): VITAMINB12, FOLATE, FERRITIN, TIBC, IRON, RETICCTPCT in the last 72 hours. Urinalysis    Component Value Date/Time   COLORURINE AMBER (A) 12/03/2015 0247   APPEARANCEUR TURBID (A) 12/03/2015 0247   LABSPEC 1.021 12/03/2015 0247   PHURINE 6.0 12/03/2015 0247   GLUCOSEU NEGATIVE 12/03/2015 0247   HGBUR NEGATIVE 12/03/2015 0247   BILIRUBINUR MODERATE (A) 12/03/2015 0247   KETONESUR 15 (A)  12/03/2015 0247   PROTEINUR 30 (A) 12/03/2015 0247   NITRITE NEGATIVE 12/03/2015 0247   LEUKOCYTESUR LARGE (A) 12/03/2015 0247   Sepsis Labs Invalid input(s): PROCALCITONIN,  WBC,  LACTICIDVEN Microbiology Recent Results (from the past 240 hour(s))  Culture, blood (routine x 2)     Status: Abnormal   Collection Time: 12/02/15  9:15 PM  Result Value Ref Range Status   Specimen Description BLOOD RIGHT ANTECUBITAL  Final   Special Requests BOTTLES DRAWN AEROBIC AND ANAEROBIC 5CC  Final   Culture  Setup Time   Final    IN BOTH AEROBIC AND ANAEROBIC BOTTLES GRAM POSITIVE COCCI IN CLUSTERS Gram Stain Report Called to,Read Back By and Verified With: T. DANG, PHARMD AT 2120 ON 12/03/15 BY C. JESSUP, MLT.    Culture (A)  Final    STAPHYLOCOCCUS SPECIES (COAGULASE NEGATIVE) THE SIGNIFICANCE OF ISOLATING THIS ORGANISM FROM A SINGLE SET OF BLOOD CULTURES WHEN MULTIPLE SETS ARE DRAWN IS UNCERTAIN. PLEASE NOTIFY THE MICROBIOLOGY DEPARTMENT WITHIN ONE WEEK IF SPECIATION AND SENSITIVITIES ARE REQUIRED.    Report Status 12/05/2015 FINAL  Final  Blood Culture ID Panel (Reflexed)     Status: None   Collection Time: 12/02/15  9:15 PM  Result Value Ref Range Status   Enterococcus species NOT DETECTED NOT DETECTED Final   Listeria monocytogenes NOT DETECTED NOT DETECTED Final   Staphylococcus species NOT DETECTED NOT DETECTED Final   Staphylococcus aureus NOT DETECTED NOT DETECTED Final   Streptococcus species NOT DETECTED NOT DETECTED Final   Streptococcus agalactiae NOT DETECTED NOT DETECTED Final   Streptococcus pneumoniae NOT DETECTED NOT DETECTED Final   Streptococcus pyogenes NOT DETECTED NOT DETECTED Final   Acinetobacter baumannii NOT DETECTED NOT DETECTED Final   Enterobacteriaceae species NOT DETECTED NOT DETECTED Final   Enterobacter cloacae complex NOT DETECTED NOT DETECTED Final   Escherichia coli NOT DETECTED NOT DETECTED Final   Klebsiella oxytoca NOT DETECTED NOT DETECTED Final    Klebsiella pneumoniae NOT DETECTED NOT DETECTED Final   Proteus species NOT DETECTED NOT DETECTED Final   Serratia marcescens NOT DETECTED NOT DETECTED Final   Haemophilus influenzae NOT DETECTED NOT DETECTED Final   Neisseria meningitidis NOT DETECTED NOT DETECTED Final   Pseudomonas aeruginosa NOT DETECTED NOT DETECTED Final   Candida albicans NOT DETECTED NOT DETECTED Final   Candida glabrata NOT DETECTED NOT DETECTED Final   Candida krusei NOT DETECTED NOT DETECTED Final   Candida parapsilosis NOT DETECTED NOT DETECTED Final   Candida tropicalis NOT DETECTED NOT DETECTED Final  Culture, blood (routine x 2)     Status: None   Collection Time: 12/02/15 10:21 PM  Result Value Ref Range Status   Specimen Description BLOOD LEFT HAND  Final   Special Requests IN PEDIATRIC BOTTLE 1CC  Final   Culture NO GROWTH 5 DAYS  Final   Report Status 12/07/2015 FINAL  Final  MRSA PCR Screening     Status: None   Collection Time: 12/03/15 12:47 AM  Result Value Ref Range Status   MRSA by PCR  NEGATIVE NEGATIVE Final    Comment:        The GeneXpert MRSA Assay (FDA approved for NASAL specimens only), is one component of a comprehensive MRSA colonization surveillance program. It is not intended to diagnose MRSA infection nor to guide or monitor treatment for MRSA infections.   Culture, blood (routine x 2)     Status: None (Preliminary result)   Collection Time: 12/03/15  1:39 AM  Result Value Ref Range Status   Specimen Description BLOOD LEFT ARM  Final   Special Requests BOTTLES DRAWN AEROBIC AND ANAEROBIC 4ML  Final   Culture NO GROWTH 4 DAYS  Final   Report Status PENDING  Incomplete  Culture, blood (routine x 2)     Status: None (Preliminary result)   Collection Time: 12/03/15  1:54 AM  Result Value Ref Range Status   Specimen Description BLOOD RIGHT HAND  Final   Special Requests IN PEDIATRIC BOTTLE 3ML  Final   Culture NO GROWTH 4 DAYS  Final   Report Status PENDING  Incomplete   Culture, Urine     Status: None   Collection Time: 12/03/15  9:28 AM  Result Value Ref Range Status   Specimen Description URINE, CATHETERIZED  Final   Special Requests NONE  Final   Culture NO GROWTH  Final   Report Status 12/04/2015 FINAL  Final     Time coordinating discharge: Over 45 minutes  SIGNED:   Tawni Millers, MD  [facility] 12/08/2015, 1:33 PM Pager   If 7PM-7AM, please contact night-coverage [email or web address] Password [email or web address]

## 2015-12-08 NOTE — Progress Notes (Signed)
DC instructions given to patient. VSS. Pt escorted by volunteer via wheelchair to private vehicle driven by family. PIV DC, hemostasis achieved. Pt educated on follow up appts, [place] PT, and medication regimen changes.

## 2015-12-08 NOTE — Progress Notes (Addendum)
Inpatient Diabetes Program Recommendations  AACE/ADA: New Consensus Statement on Inpatient Glycemic Control (2015)  Target Ranges:  Prepandial:   less than 140 mg/dL      Peak postprandial:   less than 180 mg/dL (1-2 hours)      Critically ill patients:  140 - 180 mg/dL   Lab Results  Component Value Date   GLUCAP 134 (H) 12/08/2015   HGBA1C 9.2 (H) 12/16/2012   Review of Glycemic Control  Diabetes history: DM 2 Outpatient Diabetes medications: Tresiba 20 units, Novolog 20 units TID meal coverage Current orders for Inpatient glycemic control: Lantus 5 BID, Novolog Resistant Q4hrs  Inpatient Diabetes Program Recommendations:   Patient's glucose increased to 352 mg/dl at meal times yesterday. Patient takes meal coverage at home. Please consider Novolog 5 units TID meal coverage if patient is consuming at least 50% of meals.   Thanks,  Tama Headings RN, MSN, [facility] Inpatient Diabetes Coordinator Team Pager [phone] (8a-5p)

## 2016-04-13 ENCOUNTER — Encounter: Payer: Self-pay | Admitting: Internal Medicine

## 2016-04-13 ENCOUNTER — Ambulatory Visit (INDEPENDENT_AMBULATORY_CARE_PROVIDER_SITE_OTHER): Payer: BLUE CROSS/BLUE SHIELD | Admitting: Internal Medicine

## 2016-04-13 DIAGNOSIS — J9612 Chronic respiratory failure with hypercapnia: Secondary | ICD-10-CM | POA: Diagnosis not present

## 2016-04-13 DIAGNOSIS — J9611 Chronic respiratory failure with hypoxia: Secondary | ICD-10-CM | POA: Diagnosis not present

## 2016-04-13 DIAGNOSIS — J9859 Other diseases of mediastinum, not elsewhere classified: Secondary | ICD-10-CM | POA: Diagnosis not present

## 2016-04-13 NOTE — Assessment & Plan Note (Signed)
This is suggested on cxr in 2017  Plan Do bmet and if normal do ct chest with contrast  Followup  after PFT (as above) and CT with an APP to review results

## 2016-04-13 NOTE — Assessment & Plan Note (Signed)
This is due to advanced copd and cor pulmonale Currently stable without flare up  Plan - continue 5L [place] via mouth like you always do  -continue combivent respimat 4 times daily scheduled + pulmicort neb twice daily as scheduled - no morphine refill; at some point we can discuss outpatient palliative referral for shortness of breat - respect desire not to use cpap/bipap at home  - next few to several weeks do PFT test  As follows - Pre-bd spiro and dlco only. No lung volume or bd response.

## 2016-04-13 NOTE — Progress Notes (Signed)
Subjective:    Patient ID: [name], female    DOB: [birth year], 61 y.o.   MRN: [id number]  HPI   IOV 04/13/2016  Chief Complaint  Patient presents with  . Pulm Consult    chronic respiratory failure, was referred by PCP.     61 year old female with chronic hypoxemic and hypercapnic respiratory failure presumed due to COPD not otherwise specified. I personally had evaluated in April 2017 in the intensive care unit when she was intubated and then extubated. She class IV dyspnea. She has been on 2 L oxygen for many years and is slowly crept up to 5 L oxygen. She class IV dyspnea and I had palliative care see her and she was deemed to be a full code but she declined BiPAP due to claustrophobia. She prefers to use her oxygen through her mouth because apparently at night it produces delirium and she prefers it through the mall. She was given some form of morphine which she says she have dyspnea. She is asking for refill off it for chronic pain and for dyspnea. The bottle itself is from April 2017. She is on Combivent but she only takes it when necessary. She is on oxygen. She is on albuterol when necessary and she is on Pulmicort scheduled. Husband and she said that currently she is at baseline health although it is a poor miserable baseline according to her this because of a COPD, fatigue, dyspnea  Chest x-ray 2017 the hospital suggested left suprahilar mass. Previous CT chest that I personally visualize in 2008 does not show this.   has a past medical history of Acute MI; Arthritis; COPD (chronic obstructive pulmonary disease) ([place]); Coronary artery disease; Diabetes mellitus without complication ([place]); Shortness of breath; and Stroke ([place]) (TIA ).   reports that she quit smoking about 7 years ago. Her smoking use included Cigarettes. She has a 120.00 pack-year smoking history. She has never used smokeless tobacco.  Past Surgical History:  Procedure Laterality Date  . ABDOMINAL HYSTERECTOMY     . CARDIAC CATHETERIZATION    . CORONARY ARTERY BYPASS GRAFT     2003    Allergies  Allergen Reactions  . Ciprofloxacin Rash    REACTION: hives/rash    Immunization History  Administered Date(s) Administered  . Pneumococcal-Unspecified 11/06/2012    Family History  Problem Relation Age of Onset  . Lung cancer Brother   . Emphysema Brother   . COPD Sister   . Heart attack Sister   . Diabetes Brother   . Lung cancer Brother   . Emphysema Sister      Current Outpatient Prescriptions:  .  albuterol (PROVENTIL) (2.5 MG/3ML) 0.083% nebulizer solution, Take 3 mLs (2.5 mg total) by nebulization every 3 (three) hours as needed for wheezing., Disp: 75 mL, Rfl: 12 .  atorvastatin (LIPITOR) 20 MG tablet, Take 1 tablet (20 mg total) by mouth daily., Disp: 30 tablet, Rfl: 1 .  insulin aspart (NOVOLOG FLEXPEN) 100 UNIT/ML SOPN FlexPen, Inject 6 Units into the skin 3 (three) times daily with meals. (Patient taking differently: Inject 20 Units into the skin 3 (three) times daily with meals. ), Disp: 2 pen, Rfl: 5 .  Insulin Degludec (TRESIBA FLEXTOUCH) 100 UNIT/ML SOPN, Inject 20 Units into the skin daily., Disp: , Rfl:  .  Ipratropium-Albuterol (COMBIVENT RESPIMAT) 20-100 MCG/ACT AERS respimat, Inhale 1-2 puffs into the lungs every 4 (four) hours as needed for wheezing., Disp: 1 Inhaler, Rfl: 1 .  isosorbide mononitrate (IMDUR) 60 MG  24 hr tablet, Take 1 tablet (60 mg total) by mouth daily., Disp: 30 tablet, Rfl: 1 .  metoprolol tartrate (LOPRESSOR) 50 MG tablet, Take 1 tablet (50 mg total) by mouth 2 (two) times daily., Disp: 60 tablet, Rfl: 0 .  Morphine Sulfate (MORPHINE CONCENTRATE) 10 MG/0.5ML SOLN concentrated solution, Take 0.25 mLs (5 mg total) by mouth every 3 (three) hours as needed for shortness of breath (dyspnea)., Disp: 120 mL, Rfl: 0 .  predniSONE (DELTASONE) 20 MG tablet, Take 1 tablet (20 mg total) by mouth daily with breakfast. Take twice daily for 3 days, then take daily for  3 days., Disp: 9 tablet, Rfl: 0 .  Rivaroxaban (XARELTO) 20 MG TABS tablet, Take 1 tablet (20 mg total) by mouth daily., Disp: 30 tablet, Rfl: 1 .  gabapentin (NEURONTIN) 100 MG capsule, Take 100 mg by mouth 3 (three) times daily., Disp: , Rfl:      Review of Systems  Constitutional: Negative for fever and unexpected weight change.  HENT: Negative for congestion, dental problem, ear pain, nosebleeds, postnasal drip, rhinorrhea, sinus pressure, sneezing, sore throat and trouble swallowing.   Eyes: Negative for redness and itching.  Respiratory: Positive for shortness of breath. Negative for cough, chest tightness and wheezing.   Cardiovascular: Negative for palpitations and leg swelling.  Gastrointestinal: Negative for nausea and vomiting.  Genitourinary: Negative for dysuria.  Musculoskeletal: Negative for joint swelling.  Skin: Negative for rash.  Neurological: Negative for headaches.  Hematological: Does not bruise/bleed easily.  Psychiatric/Behavioral: Negative for dysphoric mood. The patient is not nervous/anxious.        Objective:   Physical Exam Vitals:   04/13/16 1118  BP: 124/80  Pulse: 90  SpO2: 95%  Height: '5\' 6"'$  (1.676 m)    Estimated body mass index is 30.46 kg/m as calculated from the following:   Height as of 12/03/15: '5\' 6"'$  (1.676 m).   Weight as of 12/08/15: 188 lb 11.4 oz (85.6 kg).  General Appearance:    Looks Chronically ill and sitting in wheel chair OBESE - yes  Head:    Normocephalic, without obvious abnormality, atraumatic  Eyes:    PERRL - Equal and reactive to light yes, conjunctiva/corneas - clear      Ears:    Normal external ear canals, both ears  Nose:   NG tube - No   Throat:  ETT TUBE - No. 5 L oxygen cannula but by mouth   Neck:   Supple,  No enlargement/tenderness/nodules     Lungs:     Clear to auscultation bilaterally, No wheeze. Overall diminished air entry with prolonged expiration   Chest wall:    No deformity  Heart:    S1 and S2  normal, no murmur, CVP - no.  Pressors - no  Abdomen:     Soft, no masses, no organomegaly  Genitalia:    Not done  Rectal:   not done  Extremities:   Extremities- Chronic edema      Skin:   Intact in exposed areas .      Neurologic:   Sedation - none -> RASS - +1 . Moves all 4s - yes. CAM-ICU - negative for delirium . Orientation - fully oriented            Assessment & Plan:  Chronic respiratory failure This is due to advanced copd and cor pulmonale Currently stable without flare up  Plan - continue 5L [place] via mouth like you always do  -continue combivent respimat  4 times daily scheduled + pulmicort neb twice daily as scheduled - no morphine refill; at some point we can discuss outpatient palliative referral for shortness of breat - respect desire not to use cpap/bipap at home  - next few to several weeks do PFT test  As follows - Pre-bd spiro and dlco only. No lung volume or bd response.      Mediastinal mass This is suggested on cxr in 2017  Plan Do bmet and if normal do ct chest with contrast  Followup  after PFT (as above) and CT with an APP to review results    Dr. Brand Males, [name], [facility].C.P Pulmonary and Critical Care Medicine Staff Physician [place] [facility] Pager: [phone], If no answer or between  15:00h - 7:00h: call [phone]  04/13/2016 12:06 PM

## 2016-04-13 NOTE — Patient Instructions (Signed)
Chronic respiratory failure This is due to advanced copd and cor pulmonale Currently stable without flare up  Plan - continue 5L  via mouth like you always do  -continue combivent respimat 4 times daily scheduled + pulmicort neb twice daily as scheduled - no morphine refill; at some point we can discuss outpatient palliative referral for shortness of breat - respect desire not to use cpap/bipap at home  - next few to several weeks do PFT test  As follows - Pre-bd spiro and dlco only. No lung volume or bd response.      Mediastinal mass This is suggested on cxr in 2017  Plan Do bmet and if normal do ct chest with contrast  Followup  after PFT (as above) and CT with an APP to review results

## 2016-06-08 ENCOUNTER — Ambulatory Visit: Payer: BLUE CROSS/BLUE SHIELD | Admitting: Internal Medicine

## 2016-06-14 ENCOUNTER — Emergency Department (HOSPITAL_COMMUNITY): Payer: BLUE CROSS/BLUE SHIELD

## 2016-06-14 ENCOUNTER — Inpatient Hospital Stay (HOSPITAL_COMMUNITY)
Admission: EM | Admit: 2016-06-14 | Discharge: 2016-06-23 | DRG: 207 | Disposition: A | Discharge status: Home / Self Care | Payer: BLUE CROSS/BLUE SHIELD | Attending: Family Medicine | Admitting: Family Medicine

## 2016-06-14 ENCOUNTER — Encounter (HOSPITAL_COMMUNITY): Payer: Self-pay | Admitting: *Deleted

## 2016-06-14 DIAGNOSIS — I2781 Cor pulmonale (chronic): Secondary | ICD-10-CM | POA: Diagnosis present

## 2016-06-14 DIAGNOSIS — G9341 Metabolic encephalopathy: Secondary | ICD-10-CM | POA: Diagnosis present

## 2016-06-14 DIAGNOSIS — J9602 Acute respiratory failure with hypercapnia: Secondary | ICD-10-CM | POA: Diagnosis not present

## 2016-06-14 DIAGNOSIS — J9601 Acute respiratory failure with hypoxia: Secondary | ICD-10-CM

## 2016-06-14 DIAGNOSIS — D689 Coagulation defect, unspecified: Secondary | ICD-10-CM | POA: Diagnosis present

## 2016-06-14 DIAGNOSIS — Z881 Allergy status to other antibiotic agents status: Secondary | ICD-10-CM

## 2016-06-14 DIAGNOSIS — D696 Thrombocytopenia, unspecified: Secondary | ICD-10-CM | POA: Diagnosis present

## 2016-06-14 DIAGNOSIS — I2510 Atherosclerotic heart disease of native coronary artery without angina pectoris: Secondary | ICD-10-CM | POA: Diagnosis present

## 2016-06-14 DIAGNOSIS — Z9911 Dependence on respirator [ventilator] status: Secondary | ICD-10-CM

## 2016-06-14 DIAGNOSIS — I5032 Chronic diastolic (congestive) heart failure: Secondary | ICD-10-CM | POA: Diagnosis present

## 2016-06-14 DIAGNOSIS — J9621 Acute and chronic respiratory failure with hypoxia: Secondary | ICD-10-CM | POA: Diagnosis present

## 2016-06-14 DIAGNOSIS — I252 Old myocardial infarction: Secondary | ICD-10-CM | POA: Diagnosis not present

## 2016-06-14 DIAGNOSIS — Z8673 Personal history of transient ischemic attack (TIA), and cerebral infarction without residual deficits: Secondary | ICD-10-CM

## 2016-06-14 DIAGNOSIS — I10 Essential (primary) hypertension: Secondary | ICD-10-CM | POA: Diagnosis not present

## 2016-06-14 DIAGNOSIS — E785 Hyperlipidemia, unspecified: Secondary | ICD-10-CM | POA: Diagnosis present

## 2016-06-14 DIAGNOSIS — Z7189 Other specified counseling: Secondary | ICD-10-CM | POA: Diagnosis not present

## 2016-06-14 DIAGNOSIS — Z9981 Dependence on supplemental oxygen: Secondary | ICD-10-CM

## 2016-06-14 DIAGNOSIS — K5900 Constipation, unspecified: Secondary | ICD-10-CM | POA: Diagnosis present

## 2016-06-14 DIAGNOSIS — R140 Abdominal distension (gaseous): Secondary | ICD-10-CM

## 2016-06-14 DIAGNOSIS — F40240 Claustrophobia: Secondary | ICD-10-CM | POA: Diagnosis present

## 2016-06-14 DIAGNOSIS — R451 Restlessness and agitation: Secondary | ICD-10-CM | POA: Diagnosis not present

## 2016-06-14 DIAGNOSIS — R1313 Dysphagia, pharyngeal phase: Secondary | ICD-10-CM | POA: Diagnosis present

## 2016-06-14 DIAGNOSIS — A4101 Sepsis due to Methicillin susceptible Staphylococcus aureus: Secondary | ICD-10-CM | POA: Diagnosis not present

## 2016-06-14 DIAGNOSIS — Z9119 Patient's noncompliance with other medical treatment and regimen: Secondary | ICD-10-CM

## 2016-06-14 DIAGNOSIS — Z7901 Long term (current) use of anticoagulants: Secondary | ICD-10-CM

## 2016-06-14 DIAGNOSIS — R918 Other nonspecific abnormal finding of lung field: Secondary | ICD-10-CM | POA: Diagnosis not present

## 2016-06-14 DIAGNOSIS — J440 Chronic obstructive pulmonary disease with acute lower respiratory infection: Secondary | ICD-10-CM | POA: Diagnosis present

## 2016-06-14 DIAGNOSIS — J189 Pneumonia, unspecified organism: Secondary | ICD-10-CM

## 2016-06-14 DIAGNOSIS — I517 Cardiomegaly: Secondary | ICD-10-CM | POA: Diagnosis not present

## 2016-06-14 DIAGNOSIS — Z794 Long term (current) use of insulin: Secondary | ICD-10-CM

## 2016-06-14 DIAGNOSIS — D649 Anemia, unspecified: Secondary | ICD-10-CM | POA: Diagnosis present

## 2016-06-14 DIAGNOSIS — Z79899 Other long term (current) drug therapy: Secondary | ICD-10-CM

## 2016-06-14 DIAGNOSIS — I110 Hypertensive heart disease with heart failure: Secondary | ICD-10-CM | POA: Diagnosis present

## 2016-06-14 DIAGNOSIS — E872 Acidosis: Secondary | ICD-10-CM | POA: Diagnosis present

## 2016-06-14 DIAGNOSIS — E669 Obesity, unspecified: Secondary | ICD-10-CM | POA: Diagnosis present

## 2016-06-14 DIAGNOSIS — Z4659 Encounter for fitting and adjustment of other gastrointestinal appliance and device: Secondary | ICD-10-CM

## 2016-06-14 DIAGNOSIS — Z8249 Family history of ischemic heart disease and other diseases of the circulatory system: Secondary | ICD-10-CM

## 2016-06-14 DIAGNOSIS — J9622 Acute and chronic respiratory failure with hypercapnia: Secondary | ICD-10-CM | POA: Diagnosis present

## 2016-06-14 DIAGNOSIS — Z90710 Acquired absence of both cervix and uterus: Secondary | ICD-10-CM

## 2016-06-14 DIAGNOSIS — E119 Type 2 diabetes mellitus without complications: Secondary | ICD-10-CM | POA: Diagnosis present

## 2016-06-14 DIAGNOSIS — Z951 Presence of aortocoronary bypass graft: Secondary | ICD-10-CM

## 2016-06-14 DIAGNOSIS — J15211 Pneumonia due to Methicillin susceptible Staphylococcus aureus: Secondary | ICD-10-CM | POA: Diagnosis not present

## 2016-06-14 DIAGNOSIS — J441 Chronic obstructive pulmonary disease with (acute) exacerbation: Secondary | ICD-10-CM | POA: Diagnosis present

## 2016-06-14 DIAGNOSIS — I509 Heart failure, unspecified: Secondary | ICD-10-CM | POA: Diagnosis not present

## 2016-06-14 DIAGNOSIS — J209 Acute bronchitis, unspecified: Secondary | ICD-10-CM | POA: Diagnosis present

## 2016-06-14 DIAGNOSIS — Z833 Family history of diabetes mellitus: Secondary | ICD-10-CM

## 2016-06-14 DIAGNOSIS — J9690 Respiratory failure, unspecified, unspecified whether with hypoxia or hypercapnia: Secondary | ICD-10-CM

## 2016-06-14 DIAGNOSIS — T45515A Adverse effect of anticoagulants, initial encounter: Secondary | ICD-10-CM | POA: Diagnosis present

## 2016-06-14 DIAGNOSIS — Z801 Family history of malignant neoplasm of trachea, bronchus and lung: Secondary | ICD-10-CM

## 2016-06-14 DIAGNOSIS — Z825 Family history of asthma and other chronic lower respiratory diseases: Secondary | ICD-10-CM

## 2016-06-14 DIAGNOSIS — Z87891 Personal history of nicotine dependence: Secondary | ICD-10-CM

## 2016-06-14 DIAGNOSIS — R222 Localized swelling, mass and lump, trunk: Secondary | ICD-10-CM | POA: Diagnosis present

## 2016-06-14 LAB — URINALYSIS, ROUTINE W REFLEX MICROSCOPIC
BACTERIA UA: NONE SEEN
BILIRUBIN URINE: NEGATIVE
Glucose, UA: NEGATIVE mg/dL
HGB URINE DIPSTICK: NEGATIVE
KETONES UR: NEGATIVE mg/dL
LEUKOCYTES UA: NEGATIVE
NITRITE: NEGATIVE
PH: 6 (ref 5.0–8.0)
Protein, ur: 30 mg/dL — AB
SPECIFIC GRAVITY, URINE: 1.016 (ref 1.005–1.030)
Squamous Epithelial / LPF: NONE SEEN

## 2016-06-14 LAB — PROTIME-INR
INR: 1.17
Prothrombin Time: 15 seconds (ref 11.4–15.2)

## 2016-06-14 LAB — COMPREHENSIVE METABOLIC PANEL
ALBUMIN: 2.8 g/dL — AB (ref 3.5–5.0)
ALK PHOS: 85 U/L (ref 38–126)
ALT: 19 U/L (ref 14–54)
AST: 17 U/L (ref 15–41)
BUN: 15 mg/dL (ref 6–20)
CALCIUM: 9 mg/dL (ref 8.9–10.3)
CO2: >50 mmol/L — ABNORMAL HIGH (ref 22–32)
Chloride: 85 mmol/L — ABNORMAL LOW (ref 101–111)
Creatinine, Ser: 0.55 mg/dL (ref 0.44–1.00)
GLUCOSE: 159 mg/dL — AB (ref 65–99)
Potassium: 5.1 mmol/L (ref 3.5–5.1)
Sodium: 141 mmol/L (ref 135–145)
TOTAL PROTEIN: 7.2 g/dL (ref 6.5–8.1)
Total Bilirubin: 0.7 mg/dL (ref 0.3–1.2)

## 2016-06-14 LAB — GLUCOSE, CAPILLARY
GLUCOSE-CAPILLARY: 197 mg/dL — AB (ref 65–99)
Glucose-Capillary: 159 mg/dL — ABNORMAL HIGH (ref 65–99)
Glucose-Capillary: 229 mg/dL — ABNORMAL HIGH (ref 65–99)

## 2016-06-14 LAB — I-STAT TROPONIN, ED: Troponin i, poc: 0 ng/mL (ref 0.00–0.08)

## 2016-06-14 LAB — HEPARIN LEVEL (UNFRACTIONATED)
HEPARIN UNFRACTIONATED: 1.5 [IU]/mL — AB (ref 0.30–0.70)
Heparin Unfractionated: 0.9 IU/mL — ABNORMAL HIGH (ref 0.30–0.70)

## 2016-06-14 LAB — CBC WITH DIFFERENTIAL/PLATELET
BASOS ABS: 0 10*3/uL (ref 0.0–0.1)
BASOS PCT: 0 %
Eosinophils Absolute: 0 10*3/uL (ref 0.0–0.7)
Eosinophils Relative: 0 %
HEMATOCRIT: 46.4 % — AB (ref 36.0–46.0)
Hemoglobin: 12.3 g/dL (ref 12.0–15.0)
LYMPHS PCT: 14 %
Lymphs Abs: 1.3 10*3/uL (ref 0.7–4.0)
MCH: 27.5 pg (ref 26.0–34.0)
MCHC: 26.5 g/dL — ABNORMAL LOW (ref 30.0–36.0)
MCV: 103.8 fL — ABNORMAL HIGH (ref 78.0–100.0)
MONO ABS: 0.6 10*3/uL (ref 0.1–1.0)
Monocytes Relative: 7 %
NEUTROS ABS: 7.5 10*3/uL (ref 1.7–7.7)
Neutrophils Relative %: 79 %
PLATELETS: 217 10*3/uL (ref 150–400)
RBC: 4.47 MIL/uL (ref 3.87–5.11)
RDW: 14.8 % (ref 11.5–15.5)
WBC: 9.4 10*3/uL (ref 4.0–10.5)

## 2016-06-14 LAB — CBC
HEMATOCRIT: 42.3 % (ref 36.0–46.0)
HEMOGLOBIN: 11.3 g/dL — AB (ref 12.0–15.0)
MCH: 27 pg (ref 26.0–34.0)
MCHC: 26.7 g/dL — ABNORMAL LOW (ref 30.0–36.0)
MCV: 101.2 fL — AB (ref 78.0–100.0)
Platelets: 201 10*3/uL (ref 150–400)
RBC: 4.18 MIL/uL (ref 3.87–5.11)
RDW: 14.4 % (ref 11.5–15.5)
WBC: 11.5 10*3/uL — AB (ref 4.0–10.5)

## 2016-06-14 LAB — BRAIN NATRIURETIC PEPTIDE: B Natriuretic Peptide: 407.7 pg/mL — ABNORMAL HIGH (ref 0.0–100.0)

## 2016-06-14 LAB — APTT
APTT: 30 s (ref 24–36)
aPTT: 55 seconds — ABNORMAL HIGH (ref 24–36)

## 2016-06-14 LAB — PHOSPHORUS
PHOSPHORUS: 2.2 mg/dL — AB (ref 2.5–4.6)
Phosphorus: 1.8 mg/dL — ABNORMAL LOW (ref 2.5–4.6)

## 2016-06-14 LAB — PROCALCITONIN

## 2016-06-14 LAB — CREATININE, SERUM
Creatinine, Ser: 0.63 mg/dL (ref 0.44–1.00)
GFR calc non Af Amer: >60 mL/min (ref >60)

## 2016-06-14 LAB — I-STAT CG4 LACTIC ACID, ED
Lactic Acid, Venous: 1.01 mmol/L (ref 0.5–1.9)
Lactic Acid, Venous: 2.23 mmol/L (ref 0.5–1.9)

## 2016-06-14 LAB — TRIGLYCERIDES: TRIGLYCERIDES: 140 mg/dL (ref <150)

## 2016-06-14 LAB — TROPONIN I

## 2016-06-14 LAB — LIPASE, BLOOD: Lipase: 20 U/L (ref 11–51)

## 2016-06-14 LAB — MAGNESIUM
Magnesium: 2 mg/dL (ref 1.7–2.4)
Magnesium: 2 mg/dL (ref 1.7–2.4)

## 2016-06-14 LAB — LACTIC ACID, PLASMA: LACTIC ACID, VENOUS: 1.8 mmol/L (ref 0.5–1.9)

## 2016-06-14 LAB — MRSA PCR SCREENING: MRSA BY PCR: NEGATIVE

## 2016-06-14 MED ORDER — ALBUTEROL (5 MG/ML) CONTINUOUS INHALATION SOLN
INHALATION_SOLUTION | RESPIRATORY_TRACT | Status: AC
  Filled 2016-06-14: qty 20

## 2016-06-14 MED ORDER — HEPARIN SODIUM (PORCINE) 5000 UNIT/ML IJ SOLN: 5000.0000 [IU] | Freq: Three times a day (TID) | INTRAMUSCULAR | Status: DC

## 2016-06-14 MED ORDER — PROPOFOL 1000 MG/100ML IV EMUL
INTRAVENOUS | Status: AC
  Administered 2016-06-14: 50 ug/kg/min via INTRAVENOUS
  Filled 2016-06-14: qty 100

## 2016-06-14 MED ORDER — DOXYCYCLINE HYCLATE 100 MG PO TABS
100.0000 mg | ORAL_TABLET | Freq: Two times a day (BID) | ORAL | Status: DC
  Filled 2016-06-14: qty 1

## 2016-06-14 MED ORDER — MIDAZOLAM HCL 2 MG/2ML IJ SOLN: 2.0000 mg | INTRAMUSCULAR | Status: DC | PRN

## 2016-06-14 MED ORDER — PRO-STAT SUGAR FREE PO LIQD
30.0000 mL | Freq: Three times a day (TID) | ORAL | Status: DC
  Administered 2016-06-14 – 2016-06-19 (×14): 30 mL
  Filled 2016-06-14 (×14): qty 30

## 2016-06-14 MED ORDER — FENTANYL CITRATE (PF) 100 MCG/2ML IJ SOLN: 100.0000 ug | INTRAMUSCULAR | Status: DC | PRN

## 2016-06-14 MED ORDER — FENTANYL CITRATE (PF) 100 MCG/2ML IJ SOLN
50.0000 ug | Freq: Once | INTRAMUSCULAR | Status: AC
  Administered 2016-06-14: 50 ug via INTRAVENOUS

## 2016-06-14 MED ORDER — METHYLPREDNISOLONE SODIUM SUCC 125 MG IJ SOLR
60.0000 mg | Freq: Four times a day (QID) | INTRAMUSCULAR | Status: DC
  Filled 2016-06-14: qty 2

## 2016-06-14 MED ORDER — MIDAZOLAM HCL 2 MG/2ML IJ SOLN
2.0000 mg | INTRAMUSCULAR | Status: DC | PRN
  Administered 2016-06-14 (×2): 2 mg via INTRAVENOUS
  Filled 2016-06-14 (×3): qty 2

## 2016-06-14 MED ORDER — FENTANYL BOLUS VIA INFUSION: 50.0000 ug | INTRAVENOUS | Status: DC | PRN

## 2016-06-14 MED ORDER — BUDESONIDE 0.25 MG/2ML IN SUSP
0.2500 mg | Freq: Two times a day (BID) | RESPIRATORY_TRACT | Status: DC
  Administered 2016-06-14 – 2016-06-23 (×18): 0.25 mg via RESPIRATORY_TRACT
  Filled 2016-06-14 (×21): qty 2

## 2016-06-14 MED ORDER — FENTANYL CITRATE (PF) 100 MCG/2ML IJ SOLN
100.0000 ug | INTRAMUSCULAR | Status: DC | PRN
  Administered 2016-06-14: 100 ug via INTRAVENOUS
  Filled 2016-06-14 (×3): qty 2

## 2016-06-14 MED ORDER — METHYLPREDNISOLONE SODIUM SUCC 125 MG IJ SOLR
60.0000 mg | Freq: Four times a day (QID) | INTRAMUSCULAR | Status: DC
  Administered 2016-06-14 – 2016-06-16 (×7): 60 mg via INTRAVENOUS
  Filled 2016-06-14 (×3): qty 0.96
  Filled 2016-06-14: qty 2
  Filled 2016-06-14 (×4): qty 0.96
  Filled 2016-06-14: qty 2
  Filled 2016-06-14: qty 0.96

## 2016-06-14 MED ORDER — FENTANYL CITRATE (PF) 100 MCG/2ML IJ SOLN
100.0000 ug | INTRAMUSCULAR | Status: DC | PRN
Start: 2016-06-14 — End: 2016-06-14

## 2016-06-14 MED ORDER — FENTANYL CITRATE (PF) 100 MCG/2ML IJ SOLN
100.0000 ug | INTRAMUSCULAR | Status: DC | PRN
Start: 2016-06-14 — End: 2016-06-14
  Filled 2016-06-14: qty 2

## 2016-06-14 MED ORDER — VITAL HIGH PROTEIN PO LIQD
1000.0000 mL | ORAL | Status: DC
  Administered 2016-06-14 – 2016-06-18 (×5): 1000 mL
  Filled 2016-06-14: qty 1000

## 2016-06-14 MED ORDER — IOPAMIDOL (ISOVUE-300) INJECTION 61%
INTRAVENOUS | Status: AC
  Administered 2016-06-14: 75 mL
  Filled 2016-06-14: qty 75

## 2016-06-14 MED ORDER — IPRATROPIUM-ALBUTEROL 0.5-2.5 (3) MG/3ML IN SOLN
3.0000 mL | Freq: Four times a day (QID) | RESPIRATORY_TRACT | Status: DC
  Administered 2016-06-14 – 2016-06-20 (×24): 3 mL via RESPIRATORY_TRACT
  Filled 2016-06-14 (×25): qty 3

## 2016-06-14 MED ORDER — PROPOFOL 1000 MG/100ML IV EMUL
0.0000 ug/kg/min | INTRAVENOUS | Status: DC
  Administered 2016-06-14: 40 ug/kg/min via INTRAVENOUS
  Administered 2016-06-14: 50 ug/kg/min via INTRAVENOUS
  Administered 2016-06-14: 20 ug/kg/min via INTRAVENOUS
  Administered 2016-06-15: 40 ug/kg/min via INTRAVENOUS
  Administered 2016-06-15 – 2016-06-16 (×5): 35 ug/kg/min via INTRAVENOUS
  Filled 2016-06-14 (×8): qty 100

## 2016-06-14 MED ORDER — FENTANYL 2500MCG IN NS 250ML (10MCG/ML) PREMIX INFUSION
25.0000 ug/h | INTRAVENOUS | Status: DC
  Administered 2016-06-14: 50 ug/h via INTRAVENOUS
  Administered 2016-06-15: 250 ug/h via INTRAVENOUS
  Administered 2016-06-15: 150 ug/h via INTRAVENOUS
  Administered 2016-06-16: 300 ug/h via INTRAVENOUS
  Administered 2016-06-16: 400 ug/h via INTRAVENOUS
  Administered 2016-06-16: 250 ug/h via INTRAVENOUS
  Administered 2016-06-17: 400 ug/h via INTRAVENOUS
  Administered 2016-06-17 (×2): 300 ug/h via INTRAVENOUS
  Administered 2016-06-17: 400 ug/h via INTRAVENOUS
  Administered 2016-06-18: 300 ug/h via INTRAVENOUS
  Administered 2016-06-18: 150 ug/h via INTRAVENOUS
  Administered 2016-06-19: 75 ug/h via INTRAVENOUS
  Filled 2016-06-14 (×13): qty 250

## 2016-06-14 MED ORDER — PANTOPRAZOLE SODIUM 40 MG IV SOLR
40.0000 mg | Freq: Every day | INTRAVENOUS | Status: DC
  Administered 2016-06-14: 40 mg via INTRAVENOUS
  Filled 2016-06-14: qty 40

## 2016-06-14 MED ORDER — ETOMIDATE 2 MG/ML IV SOLN
INTRAVENOUS | Status: DC | PRN
  Administered 2016-06-14: 20 mg via INTRAVENOUS

## 2016-06-14 MED ORDER — DOXYCYCLINE CALCIUM 50 MG/5ML PO SYRP
100.0000 mg | ORAL_SOLUTION | Freq: Two times a day (BID) | ORAL | Status: DC
  Administered 2016-06-14 – 2016-06-20 (×12): 100 mg
  Filled 2016-06-14 (×13): qty 10

## 2016-06-14 MED ORDER — ALBUTEROL SULFATE (2.5 MG/3ML) 0.083% IN NEBU: 2.5000 mg | INHALATION_SOLUTION | RESPIRATORY_TRACT | Status: DC | PRN

## 2016-06-14 MED ORDER — ORAL CARE MOUTH RINSE
15.0000 mL | Freq: Four times a day (QID) | OROMUCOSAL | Status: DC
  Administered 2016-06-15 – 2016-06-19 (×18): 15 mL via OROMUCOSAL

## 2016-06-14 MED ORDER — SODIUM CHLORIDE 0.9 % IV SOLN
250.0000 mL | INTRAVENOUS | Status: DC | PRN
  Administered 2016-06-19: 250 mL via INTRAVENOUS

## 2016-06-14 MED ORDER — HEPARIN (PORCINE) IN NACL 100-0.45 UNIT/ML-% IJ SOLN
1600.0000 [IU]/h | INTRAMUSCULAR | Status: DC
  Administered 2016-06-14: 1200 [IU]/h via INTRAVENOUS
  Administered 2016-06-15 – 2016-06-16 (×2): 1350 [IU]/h via INTRAVENOUS
  Administered 2016-06-17: 1450 [IU]/h via INTRAVENOUS
  Administered 2016-06-17: 1350 [IU]/h via INTRAVENOUS
  Filled 2016-06-14 (×9): qty 250

## 2016-06-14 MED ORDER — INSULIN ASPART 100 UNIT/ML ~~LOC~~ SOLN
2.0000 [IU] | SUBCUTANEOUS | Status: DC
  Administered 2016-06-14: 6 [IU] via SUBCUTANEOUS
  Administered 2016-06-14 (×2): 4 [IU] via SUBCUTANEOUS
  Administered 2016-06-15 (×2): 6 [IU] via SUBCUTANEOUS

## 2016-06-14 MED ORDER — ATORVASTATIN CALCIUM 20 MG PO TABS
20.0000 mg | ORAL_TABLET | Freq: Every day | ORAL | Status: DC
  Administered 2016-06-15 – 2016-06-18 (×4): 20 mg via ORAL
  Filled 2016-06-14 (×4): qty 1

## 2016-06-14 MED ORDER — CHLORHEXIDINE GLUCONATE 0.12% ORAL RINSE (MEDLINE KIT)
15.0000 mL | Freq: Two times a day (BID) | OROMUCOSAL | Status: DC
  Administered 2016-06-14 – 2016-06-19 (×10): 15 mL via OROMUCOSAL

## 2016-06-14 MED ORDER — ALBUTEROL (5 MG/ML) CONTINUOUS INHALATION SOLN
10.0000 mg/h | INHALATION_SOLUTION | RESPIRATORY_TRACT | Status: DC
  Administered 2016-06-14: 10 mg/h via RESPIRATORY_TRACT

## 2016-06-14 MED ORDER — FENTANYL CITRATE (PF) 100 MCG/2ML IJ SOLN
100.0000 ug | INTRAMUSCULAR | Status: DC | PRN
  Administered 2016-06-14 (×2): 100 ug via INTRAVENOUS
  Filled 2016-06-14 (×2): qty 2

## 2016-06-14 MED ORDER — VITAL HIGH PROTEIN PO LIQD: 1000.0000 mL | ORAL | Status: DC

## 2016-06-14 MED ORDER — FENTANYL CITRATE (PF) 100 MCG/2ML IJ SOLN
100.0000 ug | INTRAMUSCULAR | Status: AC | PRN
  Administered 2016-06-14 (×3): 100 ug via INTRAVENOUS

## 2016-06-14 MED ORDER — MIDAZOLAM HCL 2 MG/2ML IJ SOLN
2.0000 mg | INTRAMUSCULAR | Status: DC | PRN
  Administered 2016-06-14: 2 mg via INTRAVENOUS

## 2016-06-14 MED ORDER — FENTANYL BOLUS VIA INFUSION
50.0000 ug | INTRAVENOUS | Status: DC | PRN
  Administered 2016-06-14 – 2016-06-15 (×3): 50 ug via INTRAVENOUS
  Filled 2016-06-14: qty 50

## 2016-06-14 MED ORDER — PRO-STAT SUGAR FREE PO LIQD
30.0000 mL | Freq: Two times a day (BID) | ORAL | Status: DC
  Filled 2016-06-14 (×2): qty 30

## 2016-06-14 MED ORDER — ROCURONIUM BROMIDE 50 MG/5ML IV SOLN
INTRAVENOUS | Status: DC | PRN
  Administered 2016-06-14: 80 mg via INTRAVENOUS

## 2016-06-14 NOTE — Progress Notes (Signed)
Initial Nutrition Assessment  DOCUMENTATION CODES:   Obesity unspecified  INTERVENTION:    Vital High Protein at 30 ml/h (720 ml per day)  Pro-stat 30 ml TID  Provides 1020 kcal, 108 gm protein, 602 ml free water daily  Total intake with Propofol and TF will be 1680 kcal per day  NUTRITION DIAGNOSIS:   Inadequate oral intake related to inability to eat as evidenced by NPO status.  GOAL:   Provide needs based on ASPEN/SCCM guidelines  MONITOR:   Vent status, TF tolerance, I & O's  REASON FOR ASSESSMENT:   Consult Enteral/tube feeding initiation and management  ASSESSMENT:   61 year old female with advanced COPD presents to ED hypoxic and hypercarbic requiring intubation.  Discussed patient in ICU rounds and with RN today. Received MD Consult for TF initiation and management. Nutrition-Focused physical exam completed. Findings are no fat depletion, no muscle depletion, and no edema.  Patient is currently intubated on ventilator support MV: 9.3 L/min Tmax: unavailable Propofol at 25 ml/h providing 660 kcal per day from lipids  Labs reviewed: phosphorus 2.2 (L) Medications reviewed.  Diet Order:  Diet NPO time specified  Skin:  Reviewed, no issues  Last BM:  unknown  Height:   Ht Readings from Last 1 Encounters:  06/14/16 '5\' 6"'$  (1.676 m)    Weight:   Wt Readings from Last 1 Encounters:  06/14/16 188 lb (85.3 kg)    Ideal Body Weight:  59.1 kg  BMI:  Body mass index is 30.34 kg/m.  Estimated Nutritional Needs:   Kcal:  1600  Protein:  115 gm  Fluid:  1.5 L  EDUCATION NEEDS:   No education needs identified at this time  Molli Barrows, [place], [place], [place] Pager [phone] After Hours Pager [phone]

## 2016-06-14 NOTE — H&P (Signed)
PULMONARY / CRITICAL CARE MEDICINE   Name: [name] MRN: [id number] DOB: [birth year]    ADMISSION DATE:  06/14/2016 CONSULTATION DATE:  06/14/2016  REFERRING MD:  Dr. Sherry Ruffing, EDP  CHIEF COMPLAINT:  Dyspnea   HISTORY OF PRESENT ILLNESS:  Information obtained from medical record as patient is intubated and sedated.  61 year old female with PMH of COPD (4-5L [place] at baseline), DM, CAD and medical noncompliance. Recent hospitalization 10/26 for exacerbation requiring intubation. 5/9 presented to ED with 2 day reported dyspnea and increased confusion. Today husband states that he was unable to arouse patient so EMS was called. Per EMS oxygen saturation 73% on 5L [place]. Upon arrival to ED patient was placed on a nonrebreather at 15L and given Duoneb and solu-medrol. Initially had short term improvement however required intubation.  ABG 7.35/>100/233. PCCM asked to admit.   Imagining revealed worsening mediastinal mass, husband states that wife knew of this however, kept putting off following up. On 3/8 patient presented to outpatient pulmonary office (Dr. Chase Caller) - noted to have advanced COPD and cor pulmonale, patient stated she does not desire to use CPAP/BiPAP at home.   PAST MEDICAL HISTORY :  She  has a past medical history of Acute MI ([place]); Arthritis; COPD (chronic obstructive pulmonary disease) ([place]); Coronary artery disease; Diabetes mellitus without complication ([place]); Shortness of breath; and Stroke ([place]) (TIA ).  PAST SURGICAL HISTORY: She  has a past surgical history that includes Abdominal hysterectomy; Cardiac catheterization; and Coronary artery bypass graft.  Allergies  Allergen Reactions  . Ciprofloxacin Rash    REACTION: hives/rash    No current facility-administered medications on file prior to encounter.    Current Outpatient Prescriptions on File Prior to Encounter  Medication Sig  . albuterol (PROVENTIL) (2.5 MG/3ML) 0.083% nebulizer solution Take 3 mLs (2.5 mg total)  by nebulization every 3 (three) hours as needed for wheezing.  Marland Kitchen atorvastatin (LIPITOR) 20 MG tablet Take 1 tablet (20 mg total) by mouth daily.  Marland Kitchen gabapentin (NEURONTIN) 100 MG capsule Take 100 mg by mouth 3 (three) times daily.  . insulin aspart (NOVOLOG FLEXPEN) 100 UNIT/ML SOPN FlexPen Inject 6 Units into the skin 3 (three) times daily with meals. (Patient taking differently: Inject 20 Units into the skin 3 (three) times daily with meals. )  . Insulin Degludec (TRESIBA FLEXTOUCH) 100 UNIT/ML SOPN Inject 20 Units into the skin daily.  . Ipratropium-Albuterol (COMBIVENT RESPIMAT) 20-100 MCG/ACT AERS respimat Inhale 1-2 puffs into the lungs every 4 (four) hours as needed for wheezing.  . isosorbide mononitrate (IMDUR) 60 MG 24 hr tablet Take 1 tablet (60 mg total) by mouth daily.  . metoprolol tartrate (LOPRESSOR) 50 MG tablet Take 1 tablet (50 mg total) by mouth 2 (two) times daily. (Patient taking differently: Take 25 mg by mouth 2 (two) times daily. )  . predniSONE (DELTASONE) 20 MG tablet Take 1 tablet (20 mg total) by mouth daily with breakfast. Take twice daily for 3 days, then take daily for 3 days.  . Rivaroxaban (XARELTO) 20 MG TABS tablet Take 1 tablet (20 mg total) by mouth daily.  . Morphine Sulfate (MORPHINE CONCENTRATE) 10 MG/0.5ML SOLN concentrated solution Take 0.25 mLs (5 mg total) by mouth every 3 (three) hours as needed for shortness of breath (dyspnea). (Patient not taking: Reported on 06/14/2016)    FAMILY HISTORY:  Her    SOCIAL HISTORY: She  reports that she quit smoking about 7 years ago. Her smoking use included Cigarettes. She has a  120.00 pack-year smoking history. She has never used smokeless tobacco. She reports that she does not drink alcohol.  REVIEW OF SYSTEMS:   Unable to review as patient is intubated and sedated   SUBJECTIVE:  Mechanically intubated and on propofol gtt.   VITAL SIGNS: BP (!) 172/140   Pulse 98   Resp (!) 23   Ht '5\' 6"'$  (1.676 m)   Wt  85.3 kg (188 lb)   SpO2 95%   BMI 30.34 kg/m   HEMODYNAMICS:    VENTILATOR SETTINGS: Vent Mode: PRVC FiO2 (%):  [50 %-100 %] 50 % Set Rate:  [16 bmp-20 bmp] 20 bmp Vt Set:  [460 mL] 460 mL PEEP:  [5 cmH20] 5 cmH20 Plateau Pressure:  [16 cmH20] 16 cmH20  INTAKE / OUTPUT: No intake/output data recorded.  PHYSICAL EXAMINATION: General:  Adult female, no distress  Neuro:  Sedated, moves extremities, pupils intact  HEENT:  ETT in place  Cardiovascular:  Irregular, no MRG, NI S1/S2 Lungs:  Diminished breath sounds to bases, exp wheeze, non-labored  Abdomen:  Obese, active bowel sounds  Musculoskeletal:  No acute  Skin:  Warm, dry, intact   LABS:  BMET  Recent Labs Lab 06/14/16 0855  NA 141  K 5.1  CL 85*  CO2 >50*  BUN 15  CREATININE 0.55  GLUCOSE 159*    Electrolytes  Recent Labs Lab 06/14/16 0855  CALCIUM 9.0    CBC  Recent Labs Lab 06/14/16 0855  WBC 9.4  HGB 12.3  HCT 46.4*  PLT 217    Coag's  Recent Labs Lab 06/14/16 0855  INR 1.17    Sepsis Markers  Recent Labs Lab 06/14/16 0905 06/14/16 1136  LATICACIDVEN 1.01 2.23*    ABG No results for input(s): PHART, PCO2ART, PO2ART in the last 168 hours.  Liver Enzymes  Recent Labs Lab 06/14/16 0855  AST 17  ALT 19  ALKPHOS 85  BILITOT 0.7  ALBUMIN 2.8*    Cardiac Enzymes No results for input(s): TROPONINI, PROBNP in the last 168 hours.  Glucose No results for input(s): GLUCAP in the last 168 hours.  Imaging Ct Chest W Contrast  Result Date: 06/14/2016 CLINICAL DATA:  Respiratory failure.  Hypoxia.  Dyspnea.  Cough. EXAM: CT CHEST WITH CONTRAST TECHNIQUE: Multidetector CT imaging of the chest was performed during intravenous contrast administration. CONTRAST:  59m ISOVUE-300 IOPAMIDOL (ISOVUE-300) INJECTION 61% COMPARISON:  Chest radiograph from earlier today. 07/01/2006 chest CT angiogram FINDINGS: Cardiovascular: Top-normal heart size. No significant pericardial  fluid/thickening. Left main, left anterior descending, left circumflex and right coronary atherosclerosis status post CABG. Atherosclerotic nonaneurysmal thoracic aorta. Top-normal caliber pulmonary arteries (main pulmonary artery diameter 3.2 cm). No central pulmonary emboli. Mediastinum/Nodes: No discrete thyroid nodules. Enteric tube enters the distal stomach with the tip overlying the descending duodenum on the scout topogram. Otherwise unremarkable esophagus. No axillary adenopathy. Mildly enlarged 1.0 cm right lower paratracheal node (series 3/ image 57). Enlarged 1.7 cm AP window node (series 3/ image 56). No additional pathologically enlarged mediastinal or hilar nodes. Lungs/Pleura: No pneumothorax. Small to moderate bilateral pleural effusions. Endotracheal tube tip is 3.0 cm above the carina. There is a suggestion of bronchomalacia involving the left and right bronchi. Moderate compressive atelectasis in the dependent right lower lobe. Marked compressive atelectasis in the left lower lobe. Moderate to severe centrilobular emphysema with diffuse bronchial wall thickening. Central left upper lobe 4.7 x 4.5 cm lung mass (series 7/ image 58), which abuts the mediastinal pleura and left upper hilum.  Patchy consolidation in the left upper lobe peripheral to the lung mass. Upper abdomen: Left adrenal 1.2 cm nodule (series 3/ image 151) with density 24 HU. Musculoskeletal: No aggressive appearing focal osseous lesions. Intact sternotomy wires. Mild thoracic spondylosis. IMPRESSION: 1. Well-positioned support structures as detailed . 2. Central left upper lobe 4.7 cm lung mass abutting the mediastinal pleura and left upper hilum, increased in size back to the October 2017 radiographs, compatible with primary bronchogenic carcinoma. 3. AP window and right paratracheal mediastinal lymphadenopathy, suspicious for nodal metastases. 4. Indeterminate left adrenal nodule, metastasis not excluded. 5. Patchy consolidation  in the peripheral left upper lobe, compatible with postobstructive pneumonia . 6. Small dependent bilateral pleural effusions with associated lower lobe atelectasis. 7. Moderate to severe emphysema with diffuse bronchial wall thickening, suggesting COPD . 8. 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 9. Aortic atherosclerosis. 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 Electronically Signed   By: Ilona Sorrel [name]   On: 06/14/2016 12:17   Dg Chest Portable 1 View  Result Date: 06/14/2016 CLINICAL DATA:  Ventilator dependence. EXAM: PORTABLE CHEST 1 VIEW COMPARISON:  06/14/2016 FINDINGS: 1102 hours. Endotracheal tube tip is 5.5 cm above the base the carina. The NG tube passes into the stomach although the distal tip position is not included on the film. The cardio pericardial silhouette is enlarged. Interstitial markings are diffusely coarsened with chronic features. Left parahilar mass lesion again noted. Telemetry leads overlie the chest. IMPRESSION: Endotracheal tube tip about 5.5 cm above the base the carina. Cardiomegaly with underlying interstitial changes and stable appearance left parahilar mass. Electronically Signed   By: Misty Stanley [name]   On: 06/14/2016 11:14   Dg Chest Portable 1 View  Result Date: 06/14/2016 CLINICAL DATA:  Respiratory distress EXAM: PORTABLE CHEST 1 VIEW COMPARISON:  12/05/2015 FINDINGS: Median sternotomy. Heart size within normal limits. Negative for heart failure. COPD. Left upper lobe and left hilar mass lesion has progressed since the prior study and is consistent with carcinoma. Chest CT recommended. Mild bibasilar atelectasis. IMPRESSION: Left hilar and left upper lobe mass lesion has progressed since the prior study and is consistent with carcinoma of the lung. CT chest with contrast recommended for further evaluation. Electronically Signed   By: Franchot Gallo [name]   On: 06/14/2016 09:01     STUDIES:  CXR 5/9 > Left hilar and  left upper lobe mass lesion has progressed since the prior study > consistent with carcinoma of the lung  CT Chest 5/9 > Central left upper lobe 4.7 cm lung mass abutting the mediastinal pleura and left upper hilum, patchy consolidation in the peripheral left upper lobe, mod to severe emphysema.   CULTURES: Blood 5/9 >> Sputum 5/9 >>  ANTIBIOTICS: Doxycyline 5/9 >>  SIGNIFICANT EVENTS: 5/9 > Presents to ED   LINES/TUBES: ETT 5/9 >>   DISCUSSION: 61 year old female with advanced COPD presents to ED hypoxic and hypercarbic requiring intubation.   ASSESSMENT / PLAN:  PULMONARY A: Acute on Chronic Hypoxic/Hypercarbic Respiratory Failure in setting of AECOPD H/O COPD, intolerance with NIMV due to claustrophobia, noncompliance  P:   Vent Support Trend CXR and ABG  Wean as able  VAP protocol  Scheduled Duoneb Scheduled solu-medrol   CARDIOVASCULAR A:  Diastolic HF (EF 93-26) H/O CAD P:  Cardiac Monitoring  Continue Atorvastatin  Hold Imdur Trend Troponin  ECHO pending   RENAL A:   Lactic Acidosis  P:   Trend BMP Replace  Electrolytes as needed  Trend Lactic Acid   GASTROINTESTINAL A:   Obesity  GI prophylaxis  Nutrition  P:   NPO PPI TF   HEMATOLOGIC A:   Chronic Xarelto > ?unknown reason ?h/o TIA vs a.fib  Mediastinal Mass > Primary Lung CA? Patient has been putting off having this evaluated  P:  Trend CBC Hold Xarelto  Heparin Gtt  SCDs  INFECTIOUS A:   AECOPD +/- Viral source  P:   Follow culture data Trend WBC and Fever curve  Doxycycline  RVP pending   ENDOCRINE A:   DM   P:   SSI Q4H glucose checks   NEUROLOGIC A:   Acute Encephalopathy in setting of hypercarbia  H/O TIA, arthritis  P:   RASS goal: -1/-2 Wean Propofol gtt PRN fentanyl  Hold home gabapentin, morphine    FAMILY  - Updates: Husband updated at beside   - Inter-disciplinary family meet or Palliative Care meeting due by:  5/16 > has had multiple  conversations and patient continues to state that she wants to remind a Full Code.    CC Time : 65 minutes   Hayden Pedro, AGAC-NP [place] [facility]  Pgr: [phone]  PCCM Pgr: [phone]  Attending Note:  61 year old female with severe COPD and a lung mass who presents with respiratory failure due to COPD exacerbation requiring intubation.  On exam, decreased BS diffusely.  I reviewed chest CT myself, lung mass, severe emphysema and pleural effusions noted.  Patient has been very non-compliant and has not been willing to get a biopsy or use BiPAP that was offered to her but continued to ask to be full code and husband is upholding that wish but states that that makes no sense to him.  Patient has persistently refused treatment.  Will admit to the ICU, treat as a COPD exacerbation, doxycycline for airway sterilization, check ABG and adjust vent for gas.  Will not address lung mass for now until hopefully improved.  Will need involvement of palliative care as placing a trach in this patient is a mistake.  The patient is critically ill with multiple organ systems failure and requires high complexity decision making for assessment and support, frequent evaluation and titration of therapies, application of advanced monitoring technologies and extensive interpretation of multiple databases.   Critical Care Time devoted to patient care services described in this note is  45  Minutes. This time reflects time of care of this signee Dr Jennet Maduro. This critical care time does not reflect procedure time, or teaching time or supervisory time of PA/NP/Med student/Med Resident etc but could involve care discussion time.  Rush Farmer, [name] [facility] Pulmonary/Critical Care Medicine. Pager: [phone]. After hours pager: [phone].

## 2016-06-14 NOTE — Procedures (Signed)
Intubation Procedure Note Jametta Moorehead [id number] 12/31/1955  Procedure: Intubation Indications: Respiratory insufficiency  Procedure Details Consent: Unable to obtain consent because of emergent medical necessity. Time Out: Verified patient identification, verified procedure, site/side was marked, verified correct patient position, special equipment/implants available, medications/allergies/relevent history reviewed, required imaging and test results available.  Performed  Maximum sterile technique was used including gloves, hand hygiene and mask.  MAC and 3  #3 LoPro used for intubation with glidoscope.   Evaluation Hemodynamic Status: Transient hypotension resolved spontaneously; O2 sats: stable throughout Patient's Current Condition: stable Complications: No apparent complications Patient did tolerate procedure well. Chest X-ray ordered to verify placement.  CXR: pending.   Mali M Maurice Fotheringham 06/14/2016

## 2016-06-14 NOTE — Progress Notes (Signed)
ANTICOAGULATION CONSULT NOTE - Initial Consult  Pharmacy Consult for heparin Indication: atrial fibrillation  Allergies  Allergen Reactions  . Ciprofloxacin Rash    REACTION: hives/rash    Patient Measurements: Height: '5\' 6"'$  (167.6 cm) Weight: 188 lb (85.3 kg) IBW/kg (Calculated) : 59.3 Heparin Dosing Weight: 79.5kg  Vital Signs: BP: 172/140 (05/09 0930) Pulse Rate: 98 (05/09 1051)  Labs:  Recent Labs  06/14/16 0855  HGB 12.3  HCT 46.4*  PLT 217  LABPROT 15.0  INR 1.17  CREATININE 0.55    Estimated Creatinine Clearance: 80.2 mL/min (by C-G formula based on SCr of 0.55 mg/dL).   Medical History: Past Medical History:  Diagnosis Date  . Acute MI ([place])   . Arthritis   . COPD (chronic obstructive pulmonary disease) ([place])    home O2  . Coronary artery disease   . Diabetes mellitus without complication ([place])   . Shortness of breath   . Stroke [facility]) TIA     Medications:  Infusions:  . sodium chloride    . heparin    . propofol (DIPRIVAN) infusion 50 mcg/kg/min (06/14/16 1246)    Assessment: 35 yof presented to the ED with respiratory distress requiring intubation. She was on xarelto PTA for history of afib. Now transitioning to IV heparin while patient is on the vent. Baseline CBC is WNL. Her last dose of xarelto was 5/8 at 2pm. No bleeding noted.   Goal of Therapy:  Heparin level 0.3-0.7 units/ml aPTT 66-102 seconds Monitor platelets by anticoagulation protocol: Yes   Plan:  Check baseline heparin level and aPTT Heparin gtt 1200 units/hr starting at 2pm  Check an 8 hr heparin level and aPTT Daily heparin level, aPTT and CBC  [name], Rande Lawman 06/14/2016,1:27 PM

## 2016-06-14 NOTE — ED Notes (Signed)
Pt waking up in ct.  Meds given to complete ct.

## 2016-06-14 NOTE — ED Notes (Signed)
Called CT.  CT table unavailable at this time.

## 2016-06-14 NOTE — ED Provider Notes (Signed)
[facility] DEPT Provider Note   CSN: [id number] Arrival date & time: 06/14/16  6440     History   Chief Complaint Chief Complaint  Patient presents with  . Respiratory Distress    HPI [name] is a 61 y.o. female.  The history is provided by the patient, the spouse, the EMS personnel and medical records.  Shortness of Breath  This is a recurrent problem. The average episode lasts 3 days. The problem occurs continuously.The current episode started more than 2 days ago. The problem has been rapidly worsening. Associated symptoms include cough and wheezing. Pertinent negatives include no fever, no headaches, no rhinorrhea, no sputum production, no hemoptysis, no chest pain, no syncope, no vomiting, no abdominal pain, no rash and no leg swelling. She has tried beta-agonist inhalers for the symptoms. The treatment provided no relief. She has had prior hospitalizations. She has had prior ED visits. She has had prior ICU admissions. Associated medical issues include COPD, chronic lung disease and CAD.    Past Medical History:  Diagnosis Date  . Acute MI   . Arthritis   . COPD (chronic obstructive pulmonary disease) ([place])    home O2  . Coronary artery disease   . Diabetes mellitus without complication ([place])   . Shortness of breath   . Stroke [facility]) TIA     Patient Active Problem List   Diagnosis Date Noted  . 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 Pressure injury of skin 12/04/2015  . Coronary artery disease without angina pectoris   . Morbid obesity ([place])   . Type 2 diabetes mellitus with complication, with long-term current use of insulin ([place]) 12/20/2012  . Atrial fibrillation ([place]) 12/20/2012  . Chronic respiratory failure ([place]) 12/20/2012  . HYPERLIPIDEMIA 03/11/2008  . Essential hypertension 03/11/2008  . CORONARY HEART DISEASE 03/11/2008    Past Surgical History:  Procedure Laterality Date  . ABDOMINAL HYSTERECTOMY    . CARDIAC CATHETERIZATION    . CORONARY ARTERY  BYPASS GRAFT     2003    OB History    No data available       Home Medications    Prior to Admission medications   Medication Sig Start Date End Date Taking? Authorizing Provider  albuterol (PROVENTIL) (2.5 MG/3ML) 0.083% nebulizer solution Take 3 mLs (2.5 mg total) by nebulization every 3 (three) hours as needed for wheezing. 05/27/15   Geradine Girt, DO  atorvastatin (LIPITOR) 20 MG tablet Take 1 tablet (20 mg total) by mouth daily. 02/25/13   Elsie Stain, MD  gabapentin (NEURONTIN) 100 MG capsule Take 100 mg by mouth 3 (three) times daily. 03/18/15 03/17/16  [provider]  insulin aspart (NOVOLOG FLEXPEN) 100 UNIT/ML SOPN FlexPen Inject 6 Units into the skin 3 (three) times daily with meals. Patient taking differently: Inject 20 Units into the skin 3 (three) times daily with meals.  12/18/12   Caren Griffins, MD  Insulin Degludec (TRESIBA FLEXTOUCH) 100 UNIT/ML SOPN Inject 20 Units into the skin daily.    [provider]  Ipratropium-Albuterol (COMBIVENT RESPIMAT) 20-100 MCG/ACT AERS respimat Inhale 1-2 puffs into the lungs every 4 (four) hours as needed for wheezing. 02/25/13   Elsie Stain, MD  isosorbide mononitrate (IMDUR) 60 MG 24 hr tablet Take 1 tablet (60 mg total) by mouth daily. 02/25/13   Elsie Stain, MD  metoprolol tartrate (LOPRESSOR) 50 MG tablet Take 1 tablet (50 mg total) by mouth 2 (two) times daily. 05/27/15   Geradine Girt,  DO  Morphine Sulfate (MORPHINE CONCENTRATE) 10 MG/0.5ML SOLN concentrated solution Take 0.25 mLs (5 mg total) by mouth every 3 (three) hours as needed for shortness of breath (dyspnea). 05/27/15   Geradine Girt, DO  predniSONE (DELTASONE) 20 MG tablet Take 1 tablet (20 mg total) by mouth daily with breakfast. Take twice daily for 3 days, then take daily for 3 days. 12/09/15   Arrien, Jimmy Picket, MD  Rivaroxaban (XARELTO) 20 MG TABS tablet Take 1 tablet (20 mg total) by mouth daily. 02/25/13   Elsie Stain,  MD    Family History Family History  Problem Relation Age of Onset  . Lung cancer Brother   . Emphysema Brother   . COPD Sister   . Heart attack Sister   . Diabetes Brother   . Lung cancer Brother   . Emphysema Sister     Social History Social History  Substance Use Topics  . Smoking status: Former Smoker    Packs/day: 3.00    Years: 40.00    Types: Cigarettes    Quit date: 02/06/2009  . Smokeless tobacco: Never Used  . Alcohol use No     Allergies   Ciprofloxacin   Review of Systems Review of Systems  Unable to perform ROS: Severe respiratory distress  Constitutional: Negative for chills, diaphoresis, fatigue and fever.  HENT: Positive for congestion. Negative for rhinorrhea.   Respiratory: Positive for cough, shortness of breath and wheezing. Negative for hemoptysis, sputum production and chest tightness.   Cardiovascular: Negative for chest pain, leg swelling and syncope.  Gastrointestinal: Negative for abdominal pain, diarrhea, nausea and vomiting.  Genitourinary: Negative for dysuria.  Skin: Negative for rash and wound.  Neurological: Negative for headaches.  Psychiatric/Behavioral: Positive for agitation.     Physical Exam Updated Vital Signs BP (!) 101/53   Pulse 76   Temp 99.3 F (37.4 C) (Oral)   Resp 20   Ht 5' 6"  (1.676 m)   Wt 212 lb 4.9 oz (96.3 kg)   SpO2 90%   BMI 34.27 kg/m   Physical Exam  Constitutional: She appears well-developed and well-nourished. She appears distressed.  HENT:  Head: Normocephalic and atraumatic.  Mouth/Throat: Oropharynx is clear and moist. No oropharyngeal exudate.  Eyes: Conjunctivae and EOM are normal. Pupils are equal, round, and reactive to light.  Neck: Normal range of motion. Neck supple.  Cardiovascular: Normal rate, regular rhythm and intact distal pulses.   No murmur heard. Pulmonary/Chest: Accessory muscle usage present. No stridor. Tachypnea noted. She is in respiratory distress. She has decreased  breath sounds. She has wheezes. She has no rhonchi. She has rales. She exhibits no tenderness.  Abdominal: Soft. There is no tenderness.  Musculoskeletal: She exhibits no edema.  Neurological: She is alert. No sensory deficit. She exhibits normal muscle tone.  Skin: Skin is warm. Capillary refill takes less than 2 seconds. No rash noted. She is not diaphoretic. No erythema.  Psychiatric: She has a normal mood and affect.  Nursing note and vitals reviewed.    ED Treatments / Results  Labs (all labs ordered are listed, but only abnormal results are displayed) Labs Reviewed  COMPREHENSIVE METABOLIC PANEL - Abnormal; Notable for the following:       Result Value   Chloride 85 (*)    CO2 >50 (*)    Glucose, Bld 159 (*)    Albumin 2.8 (*)    All other components within normal limits  CBC WITH DIFFERENTIAL/PLATELET - Abnormal; Notable for the  following:    HCT 46.4 (*)    MCV 103.8 (*)    MCHC 26.5 (*)    All other components within normal limits  URINALYSIS, ROUTINE W REFLEX MICROSCOPIC - Abnormal; Notable for the following:    Protein, ur 30 (*)    All other components within normal limits  BRAIN NATRIURETIC PEPTIDE - Abnormal; Notable for the following:    B Natriuretic Peptide 407.7 (*)    All other components within normal limits  CBC - Abnormal; Notable for the following:    WBC 11.5 (*)    Hemoglobin 11.3 (*)    MCV 101.2 (*)    MCHC 26.7 (*)    All other components within normal limits  PHOSPHORUS - Abnormal; Notable for the following:    Phosphorus 1.8 (*)    All other components within normal limits  PHOSPHORUS - Abnormal; Notable for the following:    Phosphorus 2.2 (*)    All other components within normal limits  HEPARIN LEVEL (UNFRACTIONATED) - Abnormal; Notable for the following:    Heparin Unfractionated 0.90 (*)    All other components within normal limits  APTT - Abnormal; Notable for the following:    aPTT 55 (*)    All other components within normal  limits  HEPARIN LEVEL (UNFRACTIONATED) - Abnormal; Notable for the following:    Heparin Unfractionated 1.50 (*)    All other components within normal limits  GLUCOSE, CAPILLARY - Abnormal; Notable for the following:    Glucose-Capillary 197 (*)    All other components within normal limits  BASIC METABOLIC PANEL - Abnormal; Notable for the following:    Chloride 87 (*)    CO2 44 (*)    Glucose, Bld 260 (*)    BUN 29 (*)    Calcium 8.8 (*)    All other components within normal limits  CBC - Abnormal; Notable for the following:    WBC 11.7 (*)    Hemoglobin 11.1 (*)    MCHC 27.5 (*)    All other components within normal limits  PHOSPHORUS - Abnormal; Notable for the following:    Phosphorus <1.0 (*)    All other components within normal limits  APTT - Abnormal; Notable for the following:    aPTT 71 (*)    All other components within normal limits  GLUCOSE, CAPILLARY - Abnormal; Notable for the following:    Glucose-Capillary 159 (*)    All other components within normal limits  GLUCOSE, CAPILLARY - Abnormal; Notable for the following:    Glucose-Capillary 229 (*)    All other components within normal limits  GLUCOSE, CAPILLARY - Abnormal; Notable for the following:    Glucose-Capillary 219 (*)    All other components within normal limits  GLUCOSE, CAPILLARY - Abnormal; Notable for the following:    Glucose-Capillary 223 (*)    All other components within normal limits  I-STAT CG4 LACTIC ACID, ED - Abnormal; Notable for the following:    Lactic Acid, Venous 2.23 (*)    All other components within normal limits  CULTURE, RESPIRATORY (NON-EXPECTORATED)  MRSA PCR SCREENING  CULTURE, BLOOD (ROUTINE X 2)  CULTURE, BLOOD (ROUTINE X 2)  RESPIRATORY PANEL BY PCR  LIPASE, BLOOD  PROTIME-INR  HIV ANTIBODY (ROUTINE TESTING)  CREATININE, SERUM  PROCALCITONIN  LACTIC ACID, PLASMA  TROPONIN I  TROPONIN I  TROPONIN I  APTT  MAGNESIUM  MAGNESIUM  TRIGLYCERIDES  PROCALCITONIN    MAGNESIUM  HEPARIN LEVEL (UNFRACTIONATED)  BLOOD GAS,  VENOUS  MAGNESIUM  PHOSPHORUS  APTT  I-STAT CG4 LACTIC ACID, ED  I-STAT TROPOININ, ED    EKG  EKG Interpretation None       Radiology Ct Chest W Contrast  Result Date: 06/14/2016 CLINICAL DATA:  Respiratory failure.  Hypoxia.  Dyspnea.  Cough. EXAM: CT CHEST WITH CONTRAST TECHNIQUE: Multidetector CT imaging of the chest was performed during intravenous contrast administration. CONTRAST:  58m ISOVUE-300 IOPAMIDOL (ISOVUE-300) INJECTION 61% COMPARISON:  Chest radiograph from earlier today. 07/01/2006 chest CT angiogram FINDINGS: Cardiovascular: Top-normal heart size. No significant pericardial fluid/thickening. Left main, left anterior descending, left circumflex and right coronary atherosclerosis status post CABG. Atherosclerotic nonaneurysmal thoracic aorta. Top-normal caliber pulmonary arteries (main pulmonary artery diameter 3.2 cm). No central pulmonary emboli. Mediastinum/Nodes: No discrete thyroid nodules. Enteric tube enters the distal stomach with the tip overlying the descending duodenum on the scout topogram. Otherwise unremarkable esophagus. No axillary adenopathy. Mildly enlarged 1.0 cm right lower paratracheal node (series 3/ image 57). Enlarged 1.7 cm AP window node (series 3/ image 56). No additional pathologically enlarged mediastinal or hilar nodes. Lungs/Pleura: No pneumothorax. Small to moderate bilateral pleural effusions. Endotracheal tube tip is 3.0 cm above the carina. There is a suggestion of bronchomalacia involving the left and right bronchi. Moderate compressive atelectasis in the dependent right lower lobe. Marked compressive atelectasis in the left lower lobe. Moderate to severe centrilobular emphysema with diffuse bronchial wall thickening. Central left upper lobe 4.7 x 4.5 cm lung mass (series 7/ image 58), which abuts the mediastinal pleura and left upper hilum. Patchy consolidation in the left upper lobe  peripheral to the lung mass. Upper abdomen: Left adrenal 1.2 cm nodule (series 3/ image 151) with density 24 HU. Musculoskeletal: No aggressive appearing focal osseous lesions. Intact sternotomy wires. Mild thoracic spondylosis. IMPRESSION: 1. Well-positioned support structures as detailed . 2. Central left upper lobe 4.7 cm lung mass abutting the mediastinal pleura and left upper hilum, increased in size back to the October 2017 radiographs, compatible with primary bronchogenic carcinoma. 3. AP window and right paratracheal mediastinal lymphadenopathy, suspicious for nodal metastases. 4. Indeterminate left adrenal nodule, metastasis not excluded. 5. Patchy consolidation in the peripheral left upper lobe, compatible with postobstructive pneumonia . 6. Small dependent bilateral pleural effusions with associated lower lobe atelectasis. 7. Moderate to severe emphysema with diffuse bronchial wall thickening, suggesting COPD . 8. 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 9. Aortic atherosclerosis. 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 Electronically Signed   By: JIlona SorrelM.D.   On: 06/14/2016 12:17   Dg Chest Port 1 View  Result Date: 06/15/2016 CLINICAL DATA:  Acute on chronic respiratory failure, intubated patient, left perihilar mass, coronary artery disease, former smoker, morbid obesity. EXAM: PORTABLE CHEST 1 VIEW COMPARISON:  CT scan chest of Jun 14, 2016 FINDINGS: The lungs are reasonably well expanded. A dominant mass is present in the left perihilar region. It measures approximately 4 cm in diameter. The interstitial markings of both lungs are coarse. Small bibasilar pleural effusions are present. The heart is top-normal in size. The pulmonary vascularity is not engorged. There is calcification in the wall of the aortic arch. The patient has undergone previous median sternotomy. The endotracheal tube tip lies 3.3 cm above the carina. The esophagogastric tube  tip projects below the inferior margin of the image. IMPRESSION: Chronic bronchitic changes. Left perihilar soft tissue mass. No acute pneumonia. Small bilateral pleural effusions. The support tubes are in reasonable position  where visualized. Thoracic aortic atherosclerosis. Electronically Signed   By: David  Martinique [name]   On: 06/15/2016 07:23   Dg Chest Portable 1 View  Result Date: 06/14/2016 CLINICAL DATA:  Ventilator dependence. EXAM: PORTABLE CHEST 1 VIEW COMPARISON:  06/14/2016 FINDINGS: 1102 hours. Endotracheal tube tip is 5.5 cm above the base the carina. The NG tube passes into the stomach although the distal tip position is not included on the film. The cardio pericardial silhouette is enlarged. Interstitial markings are diffusely coarsened with chronic features. Left parahilar mass lesion again noted. Telemetry leads overlie the chest. IMPRESSION: Endotracheal tube tip about 5.5 cm above the base the carina. Cardiomegaly with underlying interstitial changes and stable appearance left parahilar mass. Electronically Signed   By: Misty Stanley [name]   On: 06/14/2016 11:14   Dg Chest Portable 1 View  Result Date: 06/14/2016 CLINICAL DATA:  Respiratory distress EXAM: PORTABLE CHEST 1 VIEW COMPARISON:  12/05/2015 FINDINGS: Median sternotomy. Heart size within normal limits. Negative for heart failure. COPD. Left upper lobe and left hilar mass lesion has progressed since the prior study and is consistent with carcinoma. Chest CT recommended. Mild bibasilar atelectasis. IMPRESSION: Left hilar and left upper lobe mass lesion has progressed since the prior study and is consistent with carcinoma of the lung. CT chest with contrast recommended for further evaluation. Electronically Signed   By: Franchot Gallo [name]   On: 06/14/2016 09:01    Procedures Procedures (including critical care time)  CRITICAL CARE Performed by: Gwenyth Allegra Doral Digangi Total critical care time: 60 minutes Critical care time was  exclusive of separately billable procedures and treating other patients. Critical care was necessary to treat or prevent imminent or life-threatening deterioration. Critical care was time spent personally by me on the following activities: development of treatment plan with patient and/or surrogate as well as nursing, discussions with consultants, evaluation of patient's response to treatment, examination of patient, obtaining history from patient or surrogate, ordering and performing treatments and interventions, ordering and review of laboratory studies, ordering and review of radiographic studies, pulse oximetry and re-evaluation of patient's condition.   Medications Ordered in ED Medications  etomidate (AMIDATE) injection (20 mg Intravenous Given 06/14/16 1050)  propofol (DIPRIVAN) 1000 MG/100ML infusion (35 mcg/kg/min  85.3 kg Intravenous New Bag/Given 06/15/16 1005)  0.9 %  sodium chloride infusion (not administered)  ipratropium-albuterol (DUONEB) 0.5-2.5 (3) MG/3ML nebulizer solution 3 mL (3 mLs Nebulization Given 06/15/16 0748)  atorvastatin (LIPITOR) tablet 20 mg (not administered)  budesonide (PULMICORT) nebulizer solution 0.25 mg (0.25 mg Nebulization Given 06/15/16 0800)  midazolam (VERSED) injection 2 mg (2 mg Intravenous Given 06/14/16 1150)  albuterol (PROVENTIL) (2.5 MG/3ML) 0.083% nebulizer solution 2.5 mg (not administered)  insulin aspart (novoLOG) injection 2-6 Units (6 Units Subcutaneous Given 06/15/16 0752)  methylPREDNISolone sodium succinate (SOLU-MEDROL) 125 mg/2 mL injection 60 mg (60 mg Intravenous Given 06/15/16 0637)  heparin ADULT infusion 100 units/mL (25000 units/24m sodium chloride 0.45%) (1,350 Units/hr Intravenous New Bag/Given 06/15/16 0932)  fentaNYL 25098m in NS 25014m73m25ml) infusion-PREMIX (250 mcg/hr Intravenous Rate/Dose Change 06/15/16 0847)  fentaNYL (SUBLIMAZE) bolus via infusion 50 mcg (50 mcg Intravenous Bolus from Bag 06/15/16 0904)  doxycycline  (VIBRAMYCIN) 50 MG/5ML syrup 100 mg (100 mg Per Tube Given 06/15/16 0933)  feeding supplement (PRO-STAT SUGAR FREE 64) liquid 30 mL (30 mLs Per Tube Given 06/15/16 0933)  feeding supplement (VITAL HIGH PROTEIN) liquid 1,000 mL (1,000 mLs Per Tube Given 06/14/16 1623)  chlorhexidine gluconate (MEDLINE KIT) (PERIDEX) 0.12 % solution  15 mL (15 mLs Mouth Rinse Given 06/15/16 0753)  MEDLINE mouth rinse (15 mLs Mouth Rinse Given 06/15/16 0325)  sodium phosphate 20 mmol in dextrose 5 % 250 mL infusion (20 mmol Intravenous New Bag/Given 06/15/16 0758)  pantoprazole sodium (PROTONIX) 40 mg/20 mL oral suspension 40 mg (not administered)  iopamidol (ISOVUE-300) 61 % injection (75 mLs  Contrast Given 06/14/16 1150)  fentaNYL (SUBLIMAZE) injection 100 mcg (100 mcg Intravenous Given 06/14/16 1417)  fentaNYL (SUBLIMAZE) injection 50 mcg (50 mcg Intravenous Given 06/14/16 1445)     Initial Impression / Assessment and Plan / ED Course  I have reviewed the triage vital signs and the nursing notes.  Pertinent labs & imaging results that were available during my care of the patient were reviewed by me and considered in my medical decision making (see chart for details).     [name] is a 61 y.o. female with a past medical history significant for hypertension, coronary artery disease with MI status post CABG, stroke, atrial fibrillation on Xarelto, and COPD with a home oxygen requirement of 5 L who presents with hypoxia and respiratory Distress. Patient is brought in by EMS who report that patient has had some cough and shortness of breath worsening for the last few days. Patient found to have an option saturation of 73% on 5 L at home. Patient was taken neck on their arrival. Patient placed on to a nonrebreather at 15 L, given 2 DuoNeb treatments, Solu-Medrol, and magnesium with improvement in breathing.  On arrival, patient is satting 100% on 15 L. Patient's initial lung auscultation revealed coarse breath sounds and  wheezing. Patient denied any chest pain, palpitations, or abdominal pain. Patient denies any new swelling in her legs or arms. Patient denies other complaints including no fevers or chills.  Patient's family arrived. They described the patient has also not had any fevers or chills recently but has had worsening cough and shortness of breath.  Patient continues to deny any chest pain. Patient placed on continuous albuterol nebulizer. Patient continues to have some mild improvement and wheezing.  Portal chest x-ray shows concern for worsening mediastinal mass. CT scan with contrast was recommended by radiology. This was ordered however, patient has history of claustrophobia and refuses imaging. Patient agrees to try to obtain imaging at this time.  EKG showed lots of artifact but did not show evidence STEMI.    Will attempt to obtain CT scan however patient will need admission for torn distress in the setting of likely COPD exacerbation.   While awaiting for CT scan, patient had worsening of mental status. She began acting more confused and then kept ripping offer oxygen. Patient then began to desaturate. Blood gas was obtained and patient had a CO2 of greater than 95. Suspect Hypercapneais causing confusion. Conversation held with husband and plan for intubation was agreed-upon.  Patient intubated without difficulty. I supervised respiratory therapist intubation. Patient had mild bradycardia shortly after which resolved without medications.  Critical-care team called and they will admit patient for further management of hypoxic and hyper karmic respiratory failure likely secondary to COPD exacerbation.  Patient admitted for further management. Patient admitted to ICU before results of CT were posted.     Final Clinical Impressions(s) / ED Diagnoses   Final diagnoses:  Acute respiratory failure with hypoxia and hypercapnia (HCC)    Clinical Impression: 1. Acute respiratory failure with  hypoxia and hypercapnia (HCC)   2. Ventilator dependent ([place])     Disposition: Admit to Critical  care service    Marycarmen Hagey, Gwenyth Allegra, MD 06/15/16 [phone]

## 2016-06-14 NOTE — ED Notes (Signed)
Pt becoming increasingly altered.  Continuously pulling off oxygen.  MD electing to intubate.

## 2016-06-14 NOTE — ED Triage Notes (Signed)
[facility] called by pt's son for respiratory distress.  Pt was in tripod upon their arrival, sats of 75 on 5L (her norm).  Pt initially refused all care, but was convinced to take breathing tx.  Refuses C-pap d/t clausterphobia and has been previously intubated.  Given 2 G mag, 125 solumedrol, 10 albuterol and 4 atrovent en-route.  Sats of 100% on breathing tx when arriving.

## 2016-06-14 NOTE — ED Notes (Signed)
Hr dropped to 36 for 5 sec.  Rapidly increased to 60's.

## 2016-06-15 ENCOUNTER — Inpatient Hospital Stay (HOSPITAL_COMMUNITY): Payer: BLUE CROSS/BLUE SHIELD

## 2016-06-15 ENCOUNTER — Other Ambulatory Visit (HOSPITAL_COMMUNITY): Payer: BLUE CROSS/BLUE SHIELD

## 2016-06-15 DIAGNOSIS — R918 Other nonspecific abnormal finding of lung field: Secondary | ICD-10-CM

## 2016-06-15 DIAGNOSIS — J9622 Acute and chronic respiratory failure with hypercapnia: Secondary | ICD-10-CM

## 2016-06-15 LAB — BASIC METABOLIC PANEL
Anion gap: 7 (ref 5–15)
BUN: 29 mg/dL — AB (ref 6–20)
CHLORIDE: 87 mmol/L — AB (ref 101–111)
CO2: 44 mmol/L — ABNORMAL HIGH (ref 22–32)
CREATININE: 0.98 mg/dL (ref 0.44–1.00)
Calcium: 8.8 mg/dL — ABNORMAL LOW (ref 8.9–10.3)
GFR calc Af Amer: >60 mL/min (ref >60)
GLUCOSE: 260 mg/dL — AB (ref 65–99)
Potassium: 4.7 mmol/L (ref 3.5–5.1)
SODIUM: 138 mmol/L (ref 135–145)

## 2016-06-15 LAB — POCT I-STAT 3, ART BLOOD GAS (G3+)
ACID-BASE EXCESS: 22 mmol/L — AB (ref 0.0–2.0)
BICARBONATE: 49.1 mmol/L — AB (ref 20.0–28.0)
O2 Saturation: 86 %
TCO2: >50 mmol/L (ref 0–100)
pCO2 arterial: 64.4 mmHg — ABNORMAL HIGH (ref 32.0–48.0)
pH, Arterial: 7.492 — ABNORMAL HIGH (ref 7.350–7.450)
pO2, Arterial: 52 mmHg — ABNORMAL LOW (ref 83.0–108.0)

## 2016-06-15 LAB — GLUCOSE, CAPILLARY
GLUCOSE-CAPILLARY: 219 mg/dL — AB (ref 65–99)
GLUCOSE-CAPILLARY: 328 mg/dL — AB (ref 65–99)
GLUCOSE-CAPILLARY: 271 mg/dL — AB (ref 65–99)
Glucose-Capillary: 223 mg/dL — ABNORMAL HIGH (ref 65–99)
Glucose-Capillary: 313 mg/dL — ABNORMAL HIGH (ref 65–99)
Glucose-Capillary: 233 mg/dL — ABNORMAL HIGH (ref 65–99)

## 2016-06-15 LAB — PHOSPHORUS: PHOSPHORUS: 4 mg/dL (ref 2.5–4.6)

## 2016-06-15 LAB — CBC
HCT: 40.4 % (ref 36.0–46.0)
Hemoglobin: 11.1 g/dL — ABNORMAL LOW (ref 12.0–15.0)
MCH: 27.3 pg (ref 26.0–34.0)
MCHC: 27.5 g/dL — AB (ref 30.0–36.0)
MCV: 99.5 fL (ref 78.0–100.0)
PLATELETS: 203 10*3/uL (ref 150–400)
RBC: 4.06 MIL/uL (ref 3.87–5.11)
RDW: 14.6 % (ref 11.5–15.5)
WBC: 11.7 10*3/uL — AB (ref 4.0–10.5)

## 2016-06-15 LAB — RESPIRATORY PANEL BY PCR
Adenovirus: NOT DETECTED
BORDETELLA PERTUSSIS-RVPCR: NOT DETECTED
CORONAVIRUS HKU1-RVPPCR: NOT DETECTED
CORONAVIRUS NL63-RVPPCR: NOT DETECTED
CORONAVIRUS OC43-RVPPCR: NOT DETECTED
Chlamydophila pneumoniae: NOT DETECTED
Coronavirus 229E: NOT DETECTED
Influenza A: NOT DETECTED
Influenza B: NOT DETECTED
METAPNEUMOVIRUS-RVPPCR: NOT DETECTED
Mycoplasma pneumoniae: NOT DETECTED
PARAINFLUENZA VIRUS 1-RVPPCR: NOT DETECTED
PARAINFLUENZA VIRUS 2-RVPPCR: NOT DETECTED
PARAINFLUENZA VIRUS 3-RVPPCR: NOT DETECTED
Parainfluenza Virus 4: NOT DETECTED
RHINOVIRUS / ENTEROVIRUS - RVPPCR: NOT DETECTED
Respiratory Syncytial Virus: NOT DETECTED

## 2016-06-15 LAB — MAGNESIUM
MAGNESIUM: 2.2 mg/dL (ref 1.7–2.4)
Magnesium: 2.2 mg/dL (ref 1.7–2.4)

## 2016-06-15 LAB — TROPONIN I

## 2016-06-15 LAB — PROCALCITONIN: Procalcitonin: <0.1 ng/mL

## 2016-06-15 LAB — HEPARIN LEVEL (UNFRACTIONATED): Heparin Unfractionated: 0.68 IU/mL (ref 0.30–0.70)

## 2016-06-15 LAB — HIV ANTIBODY (ROUTINE TESTING W REFLEX): HIV Screen 4th Generation wRfx: NONREACTIVE

## 2016-06-15 LAB — APTT
APTT: 72 s — AB (ref 24–36)
aPTT: 71 seconds — ABNORMAL HIGH (ref 24–36)

## 2016-06-15 MED ORDER — INSULIN GLARGINE 100 UNIT/ML ~~LOC~~ SOLN
30.0000 [IU] | Freq: Every day | SUBCUTANEOUS | Status: DC
  Administered 2016-06-15 – 2016-06-21 (×7): 30 [IU] via SUBCUTANEOUS
  Filled 2016-06-15 (×8): qty 0.3

## 2016-06-15 MED ORDER — INSULIN ASPART 100 UNIT/ML ~~LOC~~ SOLN: 0.0000 [IU] | SUBCUTANEOUS | Status: DC

## 2016-06-15 MED ORDER — PANTOPRAZOLE SODIUM 40 MG PO PACK
40.0000 mg | PACK | Freq: Every day | ORAL | Status: DC
  Administered 2016-06-15 – 2016-06-20 (×5): 40 mg
  Filled 2016-06-15 (×5): qty 20

## 2016-06-15 MED ORDER — INSULIN ASPART 100 UNIT/ML ~~LOC~~ SOLN
0.0000 [IU] | SUBCUTANEOUS | Status: DC
  Administered 2016-06-15: 11 [IU] via SUBCUTANEOUS
  Administered 2016-06-15: 8 [IU] via SUBCUTANEOUS
  Administered 2016-06-15: 11 [IU] via SUBCUTANEOUS
  Administered 2016-06-15: 5 [IU] via SUBCUTANEOUS
  Administered 2016-06-16: 8 [IU] via SUBCUTANEOUS
  Administered 2016-06-16: 5 [IU] via SUBCUTANEOUS
  Administered 2016-06-16: 8 [IU] via SUBCUTANEOUS
  Administered 2016-06-16 (×2): 5 [IU] via SUBCUTANEOUS
  Administered 2016-06-17: 11 [IU] via SUBCUTANEOUS
  Administered 2016-06-17: 3 [IU] via SUBCUTANEOUS
  Administered 2016-06-17 (×2): 2 [IU] via SUBCUTANEOUS
  Administered 2016-06-17: 3 [IU] via SUBCUTANEOUS
  Administered 2016-06-17: 5 [IU] via SUBCUTANEOUS
  Administered 2016-06-18: 3 [IU] via SUBCUTANEOUS
  Administered 2016-06-18 (×2): 2 [IU] via SUBCUTANEOUS
  Administered 2016-06-18: 8 [IU] via SUBCUTANEOUS
  Administered 2016-06-18: 2 [IU] via SUBCUTANEOUS
  Administered 2016-06-18 – 2016-06-19 (×2): 3 [IU] via SUBCUTANEOUS
  Administered 2016-06-19: 2 [IU] via SUBCUTANEOUS
  Administered 2016-06-19 (×2): 5 [IU] via SUBCUTANEOUS
  Administered 2016-06-19 – 2016-06-21 (×5): 2 [IU] via SUBCUTANEOUS
  Administered 2016-06-22 (×2): 3 [IU] via SUBCUTANEOUS
  Administered 2016-06-22: 5 [IU] via SUBCUTANEOUS
  Administered 2016-06-22: 2 [IU] via SUBCUTANEOUS
  Administered 2016-06-23: 15 [IU] via SUBCUTANEOUS
  Administered 2016-06-23: 5 [IU] via SUBCUTANEOUS

## 2016-06-15 MED ORDER — FENTANYL BOLUS VIA INFUSION
25.0000 ug | INTRAVENOUS | Status: DC | PRN
Start: 2016-06-15 — End: 2016-06-19
  Administered 2016-06-18: 50 ug via INTRAVENOUS
  Filled 2016-06-15: qty 50

## 2016-06-15 MED ORDER — SODIUM PHOSPHATES 45 MMOLE/15ML IV SOLN
20.0000 mmol | Freq: Once | INTRAVENOUS | Status: AC
  Administered 2016-06-15: 20 mmol via INTRAVENOUS
  Filled 2016-06-15: qty 6.67

## 2016-06-15 MED ORDER — MIDAZOLAM HCL 2 MG/2ML IJ SOLN
1.0000 mg | INTRAMUSCULAR | Status: DC | PRN
  Administered 2016-06-16 (×2): 2 mg via INTRAVENOUS
  Filled 2016-06-15 (×2): qty 2

## 2016-06-15 NOTE — Progress Notes (Signed)
[place] for Heparin Indication: atrial fibrillation  Allergies  Allergen Reactions  . Ciprofloxacin Rash    REACTION: hives/rash    Patient Measurements: Height: '5\' 6"'$  (167.6 cm) Weight: 212 lb 4.9 oz (96.3 kg) IBW/kg (Calculated) : 59.3  Vital Signs: Temp: 99.6 F (37.6 C) (05/10 1146) Temp Source: Oral (05/10 1146) BP: 105/51 (05/10 1125) Pulse Rate: 75 (05/10 1125)  Labs:  Recent Labs  06/14/16 0855  06/14/16 1353 06/14/16 1923 06/14/16 2206 06/15/16 0039 06/15/16 0516 06/15/16 1128  HGB 12.3  --  11.3*  --   --   --  11.1*  --   HCT 46.4*  --  42.3  --   --   --  40.4  --   PLT 217  --  201  --   --   --  203  --   APTT  --   < > 30  --  55*  --  71* 72*  LABPROT 15.0  --   --   --   --   --   --   --   INR 1.17  --   --   --   --   --   --   --   HEPARINUNFRC  --   --  1.50*  --  0.90*  --  0.68  --   CREATININE 0.55  --  0.63  --   --   --  0.98  --   TROPONINI  --   --  <0.03 <0.03  --  <0.03  --   --   < > = values in this interval not displayed.  Estimated Creatinine Clearance: 69.6 mL/min (by C-G formula based on SCr of 0.98 mg/dL).  Assessment: Heparin for afib while xarelto on hold. APTT now therapeutic x2 levels.  hgb 11.1, plts wnl   Goal of Therapy:  Heparin level 0.3-0.7 units/ml aPTT 66-102 seconds Monitor platelets by anticoagulation protocol: Yes    Plan:  -Continue heparin at 1350 units/hr -Daily HL, CBC, aPTT -dc aPTTs when levels correlate    Malayla Granberry, Jake Church 06/15/2016,12:22 PM

## 2016-06-15 NOTE — Progress Notes (Signed)
Results for [name], [name] (MRN [id number]) as of 06/15/2016 08:49  Ref. Range 06/14/2016 15:42 06/14/2016 19:45 06/14/2016 23:52 06/15/2016 03:12 06/15/2016 07:25  Glucose-Capillary Latest Ref Range: 65 - 99 mg/dL 197 (H) 159 (H) 229 (H) 219 (H) 223 (H)  Noted that CBG's have started to be greater than 180 mg/dl. Recommend adding Novolog 3-4 units every 4 hours for tube feed coverage if blood sugars continue to be elevated. Will continue to monitor blood sugars while in the hospital.  Harvel Ricks RN BSN CDE Diabetes Coordinator Pager: [phone]  8am-5pm

## 2016-06-15 NOTE — Progress Notes (Signed)
ANTICOAGULATION CONSULT NOTE - Follow Up Consult  Pharmacy Consult for Heparin (Xarelto on hold) Indication: atrial fibrillation  Allergies  Allergen Reactions  . Ciprofloxacin Rash    REACTION: hives/rash    Patient Measurements: Height: '5\' 6"'$  (167.6 cm) Weight: 188 lb (85.3 kg) IBW/kg (Calculated) : 59.3  Vital Signs: Temp: 99.3 F (37.4 C) (05/09 2354) Temp Source: Oral (05/09 2354) BP: 98/49 (05/10 0300) Pulse Rate: 88 (05/10 0300)  Labs:  Recent Labs  06/14/16 0855 06/14/16 1353 06/14/16 1923 06/14/16 2206 06/15/16 0039  HGB 12.3 11.3*  --   --   --   HCT 46.4* 42.3  --   --   --   PLT 217 201  --   --   --   APTT  --  30  --  55*  --   LABPROT 15.0  --   --   --   --   INR 1.17  --   --   --   --   HEPARINUNFRC  --  1.50*  --  0.90*  --   CREATININE 0.55 0.63  --   --   --   TROPONINI  --  <0.03 <0.03  --  <0.03    Estimated Creatinine Clearance: 80.2 mL/min (by C-G formula based on SCr of 0.63 mg/dL).  Assessment: Heparin for afib while xarelto on hold, aPTT is low this AM, using aPTT to dose for now given xarelto influence on anti-Xa levels.   Goal of Therapy:  Heparin level 0.3-0.7 units/ml aPTT 66-102 seconds Monitor platelets by anticoagulation protocol: Yes   Plan:  -Inc heparin to 1350 units/hr -1200 aPTT  Narda Bonds 06/15/2016,3:27 AM

## 2016-06-15 NOTE — Progress Notes (Signed)
CRITICAL VALUE ALERT  Critical value received:  Phosphorus <1.0  Date of notification:  5/10//18  Time of notification:  0610  Critical value read back: yes  Nurse who received alert:  Deboraha Sprang, RN  MD notified (1st page):  Dr. Halford Chessman  Time of first page:  [phone]

## 2016-06-15 NOTE — Progress Notes (Signed)
[facility] Attending Note  Presenting HPI:  61 y.o. female with PMH of COPD (4-5L [place] at baseline), DM, CAD and medical noncompliance. Recent hospitalization 10/26 for exacerbation requiring intubation. 5/9 presented to ED with 2 day reported dyspnea and increased confusion. Today husband states that he was unable to arouse patient so EMS was called. Per EMS oxygen saturation 73% on 5L [place]. Upon arrival to ED patient was placed on a nonrebreather at 15L and given Duoneb and solu-medrol. Initially had short term improvement however required intubation.  ABG 7.35/>100/233. PCCM asked to admit.   Imagining revealed worsening mediastinal mass, husband states that wife knew of this however, kept putting off following up. On 3/8 patient presented to outpatient pulmonary office (Dr. Chase Caller) - noted to have advanced COPD and cor pulmonale, patient stated she does not desire to use CPAP/BiPAP at home.   Subjective:  No acute events overnight. Patient remains on FiO2 0.5.  Review of Systems:  Unable to obtain given intubation & sedation.   Vent Mode: PRVC FiO2 (%):  [50 %] 50 % Set Rate:  [20 bmp] 20 bmp Vt Set:  [460 mL] 460 mL PEEP:  [5 cmH20] 5 cmH20 Plateau Pressure:  [15 cmH20-21 cmH20] 15 cmH20  Temp:  [98.1 F (36.7 C)-99.6 F (37.6 C)] 99.6 F (37.6 C) (05/10 1146) Pulse Rate:  [72-104] 75 (05/10 1125) Resp:  [17-28] 20 (05/10 1125) BP: (81-152)/(38-93) 105/51 (05/10 1125) SpO2:  [89 %-100 %] 90 % (05/10 1125) FiO2 (%):  [50 %] 50 % (05/10 1125) Weight:  [212 lb 4.9 oz (96.3 kg)] 212 lb 4.9 oz (96.3 kg) (05/10 0327)   General:  Husband at bedside. Intubated. No distress. Integument:  Warm & dry. No rash on exposed skin. HEENT:  Moist mucus memebranes. No scleral icterus. Endotracheal tube in place. Neurological:  Pupils symmetric and pinpoint. Sedated. No spontaneous movements. Musculoskeletal:  No joint effusion or erythema appreciated. Symmetric muscle  bulk. Pulmonary:  Symmetric chest wall rise on ventilator. Coarse and distant breath sounds bilaterally. Cardiovascular:  Regular rate & rhythm. No appreciable JVD. Normal S1 & S2. Telemetry:  Sinus rhythm. Abdomen:  Soft. Nondistended. Normoactive bowel sounds.  LINES/TUBES: OETT 7.5 5/9 >>> Foley 5/9 >>> OGT 5/9 >>> PIV  CBC Latest Ref Rng & Units 06/15/2016 06/14/2016 06/14/2016  WBC 4.0 - 10.5 K/uL 11.7(H) 11.5(H) 9.4  Hemoglobin 12.0 - 15.0 g/dL 11.1(L) 11.3(L) 12.3  Hematocrit 36.0 - 46.0 % 40.4 42.3 46.4(H)  Platelets 150 - 400 K/uL 203 201 217   BMP Latest Ref Rng & Units 06/15/2016 06/14/2016 06/14/2016  Glucose 65 - 99 mg/dL 260(H) - 159(H)  BUN 6 - 20 mg/dL 29(H) - 15  Creatinine 0.44 - 1.00 mg/dL 0.98 0.63 0.55  Sodium 135 - 145 mmol/L 138 - 141  Potassium 3.5 - 5.1 mmol/L 4.7 - 5.1  Chloride 101 - 111 mmol/L 87(L) - 85(L)  CO2 22 - 32 mmol/L 44(H) - >50(H)  Calcium 8.9 - 10.3 mg/dL 8.8(L) - 9.0    Hepatic Function Latest Ref Rng & Units 06/14/2016 12/02/2015 05/21/2015  Total Protein 6.5 - 8.1 g/dL 7.2 7.1 6.9  Albumin 3.5 - 5.0 g/dL 2.8(L) 3.3(L) 2.7(L)  AST 15 - 41 U/L 17 34 12(L)  ALT 14 - 54 U/L 19 17 13(L)  Alk Phosphatase 38 - 126 U/L 85 75 65  Total Bilirubin 0.3 - 1.2 mg/dL 0.7 1.8(H) 0.9    IMAGING/STUDIES: CT CHEST W/ 5/9: IMPRESSION: 1. Well-positioned support structures  as detailed . 2. Central left upper lobe 4.7 cm lung mass abutting the mediastinal pleura and left upper hilum, increased in size back to the October 2017 radiographs, compatible with primary bronchogenic carcinoma. 3. AP window and right paratracheal mediastinal lymphadenopathy, suspicious for nodal metastases. 4. Indeterminate left adrenal nodule, metastasis not excluded. 5. Patchy consolidation in the peripheral left upper lobe, compatible with postobstructive pneumonia . 6. Small dependent bilateral pleural effusions with associated lower lobe atelectasis. 7. Moderate to severe emphysema  with diffuse bronchial wall thickening, suggesting COPD . 8. 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 9. Aortic atherosclerosis. 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 PORT CXR 5/10:  Personally reviewed by me. Left perihilar mass appreciated. Endotracheal tube in good position. Enteric feeding tube coursing below diaphragm. Sternotomy wires noted. Bilateral lower lobe opacification.  MICROBIOLOGY: MRSA PCR 5/9:  Negative  HIV 5/9:  Nonreactive  Blood Cultures x2 5/9 >>> Tracheal Aspirate Culture 5/9 >>> Respiratory Viral Panel PCR 5/10 >>>  ANTIBIOTICS: Doxycycline 5/9 >>>  SIGNIFICANT EVENTS: 05/09 - Admit  ASSESSMENT/PLAN:  61 y.o. female advanced COPD and emphysema as well as chronic hypoxic respiratory failure. Patient has underlying COPD of unclear severity but certainly significant emphysema upon review of her CT imaging. She has chronic oxygen requirements of 4-5 L/m and failed noninvasive positive pressure ventilation due to claustrophobia.  1. Acute on chronic hypoxic respiratory failure: Continuing to wean FiO2 and ventilator support. Respiratory viral panel and further infectious workup pending. Targeting FiO2 88-94% to prevent V/Q mismatching and worsening of ventilation. Empiric treatment with doxycycline while awaiting taken last per culture. 2. Acute on chronic hypercarbic respiratory failure/COPD exacerbation: Continuing patient on Duonebs every 6 hours scheduled & Solu-Medrol IV every 6 hours. Repeat ABG in the morning. Decreasing respiratory rate to prevent overventilation. Empiric treatment with doxycycline while awaiting taken last per culture. 3. Diabetes mellitus type 2: Starting Lantus 30 units subcutaneous daily & switching to moderate sliding scale algorithm with Accu-Cheks every 4 hours. 4. Left upper lobe mass: Plan for further workup as an outpatient with PET/CT imaging. 5. Chronic cor pulmonale & diastolic congestive  heart failure: No signs of acute decompensation. Holding on diuresis. Checking transthoracic echocardiogram. 6. Coronary artery disease: No sign of acute infarction. 7. Lactic acidosis: Mild. Resolved. 8. Acute encephalopathy: Likely multifactorial with hypercarbia and hypoxia. Continuing to limit sedation. 9. Coagulopathy: Secondary to chronic Xarelto for unclear reason (TIA/stroke versus atrial fibrillation). Continuing heparin drip per pharmacy protocol. 10. Anemia: Mild. Trending cell counts daily with CBC.  Prophylaxis:  Protonix via tube daily. Heparin drip per pharmacy protocol.  Diet:  NPO. Continuing tube feedings per dietary recommendations.  Code Status:  Full Code per previous physician discussions.  Disposition:  Remaining critically ill in the ICU.  Family Update:  Husband updated at bedside by me during rounds.  I have personally spent a total of 33 minutes of critical care time today caring for the patient, updating her husband, & reviewing the patient's electronic medical record.  Sonia Baller Ashok Cordia, [name] [facility] Pager:  [phone] After 3pm or if no response, call [phone] 12:20 PM 06/15/16

## 2016-06-16 ENCOUNTER — Inpatient Hospital Stay (HOSPITAL_COMMUNITY): Payer: BLUE CROSS/BLUE SHIELD

## 2016-06-16 DIAGNOSIS — J9601 Acute respiratory failure with hypoxia: Secondary | ICD-10-CM

## 2016-06-16 DIAGNOSIS — J9602 Acute respiratory failure with hypercapnia: Secondary | ICD-10-CM

## 2016-06-16 DIAGNOSIS — I517 Cardiomegaly: Secondary | ICD-10-CM

## 2016-06-16 LAB — GLUCOSE, CAPILLARY
GLUCOSE-CAPILLARY: 205 mg/dL — AB (ref 65–99)
GLUCOSE-CAPILLARY: 302 mg/dL — AB (ref 65–99)
Glucose-Capillary: 260 mg/dL — ABNORMAL HIGH (ref 65–99)
Glucose-Capillary: 244 mg/dL — ABNORMAL HIGH (ref 65–99)
Glucose-Capillary: 270 mg/dL — ABNORMAL HIGH (ref 65–99)
Glucose-Capillary: 238 mg/dL — ABNORMAL HIGH (ref 65–99)

## 2016-06-16 LAB — BASIC METABOLIC PANEL
ANION GAP: 4 — AB (ref 5–15)
BUN: 26 mg/dL — ABNORMAL HIGH (ref 6–20)
CHLORIDE: 86 mmol/L — AB (ref 101–111)
CO2: 43 mmol/L — AB (ref 22–32)
Calcium: 8.4 mg/dL — ABNORMAL LOW (ref 8.9–10.3)
Creatinine, Ser: 0.67 mg/dL (ref 0.44–1.00)
GFR calc Af Amer: >60 mL/min (ref >60)
Glucose, Bld: 279 mg/dL — ABNORMAL HIGH (ref 65–99)
POTASSIUM: 4.6 mmol/L (ref 3.5–5.1)
Sodium: 133 mmol/L — ABNORMAL LOW (ref 135–145)

## 2016-06-16 LAB — BLOOD GAS, ARTERIAL
ACID-BASE EXCESS: 17.7 mmol/L — AB (ref 0.0–2.0)
BICARBONATE: 44.2 mmol/L — AB (ref 20.0–28.0)
Drawn by: 244871
FIO2: 70
LHR: 14 {breaths}/min
O2 Saturation: 98.9 %
PEEP: 8 cmH2O
PO2 ART: 142 mmHg — AB (ref 83.0–108.0)
Patient temperature: 99.6
VT: 460 mL
pCO2 arterial: 85 mmHg (ref 32.0–48.0)
pH, Arterial: 7.339 — ABNORMAL LOW (ref 7.350–7.450)

## 2016-06-16 LAB — CBC WITH DIFFERENTIAL/PLATELET
BASOS ABS: 0 10*3/uL (ref 0.0–0.1)
Basophils Relative: 0 %
Eosinophils Absolute: 0 10*3/uL (ref 0.0–0.7)
Eosinophils Relative: 0 %
HEMATOCRIT: 37.7 % (ref 36.0–46.0)
Hemoglobin: 10.2 g/dL — ABNORMAL LOW (ref 12.0–15.0)
LYMPHS ABS: 0.6 10*3/uL — AB (ref 0.7–4.0)
LYMPHS PCT: 3 %
MCH: 26.7 pg (ref 26.0–34.0)
MCHC: 27.1 g/dL — ABNORMAL LOW (ref 30.0–36.0)
MCV: 98.7 fL (ref 78.0–100.0)
MONO ABS: 0.6 10*3/uL (ref 0.1–1.0)
Monocytes Relative: 4 %
NEUTROS ABS: 15.1 10*3/uL — AB (ref 1.7–7.7)
Neutrophils Relative %: 93 %
Platelets: 170 10*3/uL (ref 150–400)
RBC: 3.82 MIL/uL — ABNORMAL LOW (ref 3.87–5.11)
RDW: 15 % (ref 11.5–15.5)
WBC: 16.3 10*3/uL — ABNORMAL HIGH (ref 4.0–10.5)

## 2016-06-16 LAB — ECHOCARDIOGRAM COMPLETE
Height: 66 in
WEIGHTICAEL: 3442.7 [oz_av]

## 2016-06-16 LAB — PROCALCITONIN: Procalcitonin: <0.1 ng/mL

## 2016-06-16 LAB — HEMOGLOBIN A1C
Hgb A1c MFr Bld: 6.3 % — ABNORMAL HIGH (ref 4.8–5.6)
Mean Plasma Glucose: 134 mg/dL

## 2016-06-16 LAB — HEPARIN LEVEL (UNFRACTIONATED): HEPARIN UNFRACTIONATED: 0.64 [IU]/mL (ref 0.30–0.70)

## 2016-06-16 LAB — APTT: aPTT: 77 seconds — ABNORMAL HIGH (ref 24–36)

## 2016-06-16 MED ORDER — SODIUM CHLORIDE 0.9 % IV SOLN
0.4000 ug/kg/h | INTRAVENOUS | Status: DC
  Administered 2016-06-16: 1 ug/kg/h via INTRAVENOUS
  Administered 2016-06-16: 0.2 ug/kg/h via INTRAVENOUS
  Filled 2016-06-16 (×3): qty 2

## 2016-06-16 MED ORDER — ACETAZOLAMIDE SODIUM 500 MG IJ SOLR
250.0000 mg | Freq: Two times a day (BID) | INTRAMUSCULAR | Status: AC
  Administered 2016-06-16 – 2016-06-17 (×3): 250 mg via INTRAVENOUS
  Filled 2016-06-16 (×3): qty 250

## 2016-06-16 MED ORDER — METHYLPREDNISOLONE SODIUM SUCC 40 MG IJ SOLR
40.0000 mg | Freq: Two times a day (BID) | INTRAMUSCULAR | Status: DC
  Administered 2016-06-16 – 2016-06-17 (×2): 40 mg via INTRAVENOUS
  Filled 2016-06-16 (×2): qty 1

## 2016-06-16 MED ORDER — PERFLUTREN LIPID MICROSPHERE
1.0000 mL | INTRAVENOUS | Status: AC | PRN
  Administered 2016-06-16: 2 mL via INTRAVENOUS
  Filled 2016-06-16: qty 10

## 2016-06-16 MED ORDER — PROPOFOL 1000 MG/100ML IV EMUL
5.0000 ug/kg/min | INTRAVENOUS | Status: DC
  Administered 2016-06-16: 20 ug/kg/min via INTRAVENOUS
  Administered 2016-06-16 – 2016-06-17 (×2): 30 ug/kg/min via INTRAVENOUS
  Administered 2016-06-17: 25 ug/kg/min via INTRAVENOUS
  Administered 2016-06-17 (×2): 30 ug/kg/min via INTRAVENOUS
  Administered 2016-06-18: 25 ug/kg/min via INTRAVENOUS
  Administered 2016-06-18: 15 ug/kg/min via INTRAVENOUS
  Administered 2016-06-18 – 2016-06-19 (×2): 30 ug/kg/min via INTRAVENOUS
  Filled 2016-06-16 (×11): qty 100

## 2016-06-16 MED ORDER — PROPOFOL 1000 MG/100ML IV EMUL
INTRAVENOUS | Status: AC
  Filled 2016-06-16: qty 100

## 2016-06-16 NOTE — Progress Notes (Signed)
[facility] Progress Note Patient Name: Lititia Sen DOB: Mar 08, 1955 MRN: [id number]   Date of Service  06/16/2016  HPI/Events of Note  Notified that pCO2 85 with normal pH. Likely contribution metabolic alkalosis, would consider acetazolamide, increase Ve.   eICU Interventions       Intervention Category Major Interventions: Acid-Base disturbance - evaluation and management  BYRUM,ROBERT S. 06/16/2016, 4:48 AM

## 2016-06-16 NOTE — Progress Notes (Signed)
[place] for Heparin Indication: atrial fibrillation  Allergies  Allergen Reactions  . Ciprofloxacin Rash    REACTION: hives/rash    Patient Measurements: Height: '5\' 6"'$  (167.6 cm) Weight: 215 lb 2.7 oz (97.6 kg) IBW/kg (Calculated) : 59.3  Vital Signs: Temp: 98.9 F (37.2 C) (05/11 0726) Temp Source: Oral (05/11 0726) BP: 105/58 (05/11 0700) Pulse Rate: 62 (05/11 0700)  Labs:  Recent Labs  06/14/16 0855  06/14/16 1353 06/14/16 1923 06/14/16 2206 06/15/16 0039 06/15/16 0516 06/15/16 1128 06/16/16 0304 06/16/16 0536  HGB 12.3  --  11.3*  --   --   --  11.1*  --  10.2*  --   HCT 46.4*  --  42.3  --   --   --  40.4  --  37.7  --   PLT 217  --  201  --   --   --  203  --  170  --   APTT  --   < > 30  --  55*  --  71* 72*  --  77*  LABPROT 15.0  --   --   --   --   --   --   --   --   --   INR 1.17  --   --   --   --   --   --   --   --   --   HEPARINUNFRC  --   < > 1.50*  --  0.90*  --  0.68  --   --  0.64  CREATININE 0.55  --  0.63  --   --   --  0.98  --  0.67  --   TROPONINI  --   --  <0.03 <0.03  --  <0.03  --   --   --   --   < > = values in this interval not displayed.  Estimated Creatinine Clearance: 85.9 mL/min (by C-G formula based on SCr of 0.67 mg/dL).  Assessment: 43 yof continuing on heparin for afib while xarelto on hold. APTT remains therapeutic. Heparin level stable 0.64 - still not correlating.  Hgb 11.1, plts wnl, no bleed documented   Goal of Therapy:  Heparin level 0.3-0.7 units/ml aPTT 66-102 seconds Monitor platelets by anticoagulation protocol: Yes    Plan:  -Continue heparin at 1350 units/hr -Daily heparin level, CBC, aPTT -D/c aPTT when levels correlate -Monitor for s/sx bleeding   Elicia Lamp, PharmD, BCPS Clinical Pharmacist 06/16/2016 7:38 AM

## 2016-06-16 NOTE — Progress Notes (Signed)
[facility] Note  Presenting HPI:  61 y.o. female with PMH of COPD (4-5L [place] at baseline), DM, CAD and medical noncompliance. Recent hospitalization 10/26 for exacerbation requiring intubation. 5/9 presented to ED with 2 day reported dyspnea and increased confusion. Today husband states that he was unable to arouse patient so EMS was called. Per EMS oxygen saturation 73% on 5L . Upon arrival to ED patient was placed on a nonrebreather at 15L and given Duoneb and solu-medrol. Initially had short term improvement however required intubation.  ABG 7.35/>100/233. PCCM asked to admit.   Imagining revealed worsening mediastinal mass, husband states that wife knew of this however, kept putting off following up. On 3/8 patient presented to outpatient pulmonary office (Dr. Chase Caller) - noted to have advanced COPD and cor pulmonale, patient stated she does not desire to use CPAP/BiPAP at home.   Subjective:   RN reports pt did not tolerate WUA, noted tremor / shaking at times (husband reports this is her baseline), no acute events overnight.  ABG 7.33 / 85 / 142 / 44   Vent Mode: PRVC FiO2 (%):  [40 %-70 %] (P) 40 % Set Rate:  [14 bmp-20 bmp] 20 bmp Vt Set:  [460 mL] 460 mL PEEP:  [5 cmH20-8 cmH20] 8 cmH20 Plateau Pressure:  [15 cmH20-17 cmH20] 17 cmH20  Temp:  [97.8 F (36.6 C)-99.8 F (37.7 C)] 98.9 F (37.2 C) (05/11 0726) Pulse Rate:  [62-88] 62 (05/11 0700) Resp:  [14-40] 16 (05/11 0700) BP: (97-113)/(47-65) 105/58 (05/11 0700) SpO2:  [89 %-99 %] 95 % (05/11 0759) FiO2 (%):  [40 %-70 %] (P) 40 % (05/11 0759) Weight:  [215 lb 2.7 oz (97.6 kg)] 215 lb 2.7 oz (97.6 kg) (05/11 0500)   General: obese female on vent in NAD HEENT: MM pink/moist, ETT Neuro: sedate, opens eyes to voice, moves all ext spontaneously  CV: s1s2 rrr, no m/r/g PULM: even/non-labored, lungs bilaterally coarse with soft wheeze YJ:EHUD, non-tender, bsx4 active  Extremities: warm/dry, trace BLE edema   Skin: no rashes.  Large round lesion under right eye (?basal cell)   LINES/TUBES: OETT 7.5 5/9 >>> Foley 5/9 >>> OGT 5/9 >>> PIV  CBC Latest Ref Rng & Units 06/16/2016 06/15/2016 06/14/2016  WBC 4.0 - 10.5 K/uL 16.3(H) 11.7(H) 11.5(H)  Hemoglobin 12.0 - 15.0 g/dL 10.2(L) 11.1(L) 11.3(L)  Hematocrit 36.0 - 46.0 % 37.7 40.4 42.3  Platelets 150 - 400 K/uL 170 203 201   BMP Latest Ref Rng & Units 06/16/2016 06/15/2016 06/14/2016  Glucose 65 - 99 mg/dL 279(H) 260(H) -  BUN 6 - 20 mg/dL 26(H) 29(H) -  Creatinine 0.44 - 1.00 mg/dL 0.67 0.98 0.63  Sodium 135 - 145 mmol/L 133(L) 138 -  Potassium 3.5 - 5.1 mmol/L 4.6 4.7 -  Chloride 101 - 111 mmol/L 86(L) 87(L) -  CO2 22 - 32 mmol/L 43(H) 44(H) -  Calcium 8.9 - 10.3 mg/dL 8.4(L) 8.8(L) -    Hepatic Function Latest Ref Rng & Units 06/14/2016 12/02/2015 05/21/2015  Total Protein 6.5 - 8.1 g/dL 7.2 7.1 6.9  Albumin 3.5 - 5.0 g/dL 2.8(L) 3.3(L) 2.7(L)  AST 15 - 41 U/L 17 34 12(L)  ALT 14 - 54 U/L 19 17 13(L)  Alk Phosphatase 38 - 126 U/L 85 75 65  Total Bilirubin 0.3 - 1.2 mg/dL 0.7 1.8(H) 0.9    IMAGING/STUDIES: CT CHEST W/ 5/9: IMPRESSION: 1. Well-positioned support structures as detailed . 2. Central left upper lobe 4.7 cm lung mass abutting  the mediastinal pleura and left upper hilum, increased in size back to the October 2017 radiographs, compatible with primary bronchogenic carcinoma. 3. AP window and right paratracheal mediastinal lymphadenopathy, suspicious for nodal metastases. 4. Indeterminate left adrenal nodule, metastasis not excluded. 5. Patchy consolidation in the peripheral left upper lobe, compatible with postobstructive pneumonia . 6. Small dependent bilateral pleural effusions with associated lower lobe atelectasis. 7. Moderate to severe emphysema with diffuse bronchial wall thickening, suggesting COPD . 8. 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 9. Aortic atherosclerosis. Left main and 3 vessel coronary  atherosclerosis status post CABG. PORT CXR 5/10:  Left perihilar mass appreciated. Endotracheal tube in good position. Enteric feeding tube coursing below diaphragm. Sternotomy wires noted. Bilateral lower lobe opacification. TTE 5/11 >>   MICROBIOLOGY: MRSA PCR 5/9:  Negative  HIV 5/9:  Nonreactive  Blood Cultures x2 5/9 >> Tracheal Aspirate Culture 5/9 >> moderate staph aureus >>  Respiratory Viral Panel PCR 5/10 >> negative   ANTIBIOTICS: Doxycycline 5/9 >>  SIGNIFICANT EVENTS: 05/09 - Admit  ASSESSMENT/PLAN:  61 y.o. female advanced COPD and emphysema as well as chronic hypoxic respiratory failure. Patient has underlying COPD of unclear severity but certainly significant emphysema upon review of her CT imaging. She has chronic oxygen requirements of 4-5 L/m and failed noninvasive positive pressure ventilation due to claustrophobia.    Acute on chronic hypoxic respiratory failure  P: PRVC 8 cc/kg  Wean O2 for sats 88-94% Increase rate to 22 for now > goal to change sedation and retry wean ABX as above    Acute on chronic hypercarbic respiratory failure/COPD exacerbation - suspect sedation and increased O2 are contributing to hypercarbia  P: Adjust rate as above  Continue duonebs Q6 Reduce Solumedrol to 40 mg IV Q12 Follow up ABG in am   Staph Aureus Tracheobronchitis vs PNA - noted on sputum culture, sensitivities pending  P:  Follow up CXR in am  Continue doxycycline for now, may need to escalate if decompensates   Diabetes mellitus type 2 Lantus 30 units QD CBG with SSI  Reduction in steroids should improve glucose  Left upper lobe mass - previously lost to follow up  P: Plan for outpatient PET CT once discharged   Chronic cor pulmonale & diastolic congestive heart failure - without acute decompensation  P: Lasix 40 mg IV x1  Await TTE    Coronary artery disease - EKG stable, NSR without ST changes  P: Monitor telemetry   Lactic acidosis -  resolved    Acute encephalopathy - likely in setting of hypercarbic/hypoxic respiratory failure  P: Correct acute hypercarbia Minimize sedation as able    Coagulopathy - on Xarelto, ? Indication (TIA/CVA vs AF) P: Continue heparin gtt per pharmacy    Anemia  P: Monitor CBC / evidence of bleeding while on anticoagulation  Questionable Basal Cell Carcinoma - on right face under eye  P: Follow up with Derm post discharge    Prophylaxis:  Protonix via tube daily. Heparin drip per pharmacy protocol.  Diet:  NPO. TF per Nutrition  Code Status:  Full Code    Disposition:  ICU Family Update:  Husband updated at bedside am 5/11 on NP rounds.   CC Time: 16 minutes   Noe Gens, NP-C [place] Pgr: [phone] or if no answer [phone] 06/16/2016, 8:04 AM

## 2016-06-16 NOTE — Progress Notes (Signed)
CRITICAL VALUE ALERT  Critical value received:  PCO2 85  Date of notification: 06/16/16  Time of notification: 3832  Critical value read back: Yes   Nurse who received alert: [place] [id number]  MD notified (1st page): [place]  Time of first page:  [phone]

## 2016-06-16 NOTE — Progress Notes (Signed)
eLink Physician-Brief Progress Note Patient Name: [name] DOB: July 10, 1955 MRN: [id number]   Date of Service  06/16/2016  HPI/Events of Note  Failing precedex  Agitated, hypertensive  eICU Interventions  Back to propofol gtt Goal RASS-1     Intervention Category Major Interventions: Delirium, psychosis, severe agitation - evaluation and management  ALVA,RAKESH V. 06/16/2016, 3:27 PM

## 2016-06-16 NOTE — Progress Notes (Signed)
SBT trial done cpap/psv 10/5, 40%. Pt failed SBT trial due to agitation and desat. RT placed back on full support with same settings. RT was able to decrease FIO2 40% and Peep 8

## 2016-06-16 NOTE — Progress Notes (Signed)
  Echocardiogram 2D Echocardiogram with Definity has been performed.  Tresa Res 06/16/2016, 10:38 AM

## 2016-06-16 NOTE — Progress Notes (Signed)
Inpatient Diabetes Program Recommendations  AACE/ADA: New Consensus Statement on Inpatient Glycemic Control (2015)  Target Ranges:  Prepandial:   less than 140 mg/dL      Peak postprandial:   less than 180 mg/dL (1-2 hours)      Critically ill patients:  140 - 180 mg/dL   Lab Results  Component Value Date   GLUCAP 260 (H) 06/16/2016   HGBA1C 6.3 (H) 06/15/2016    Review of Glycemic Control:  Results for [name], [name] (MRN [id number]) as of 06/16/2016 08:57  Ref. Range 06/15/2016 11:34 06/15/2016 16:38 06/15/2016 19:37 06/15/2016 23:07 06/16/2016 03:49  Glucose-Capillary Latest Ref Range: 65 - 99 mg/dL 328 (H) 313 (H) 233 (H) 271 (H) 260 (H)   Diabetes history: Type 2 diabetes Outpatient Diabetes medications: Tresiba 20 units daily, Novolog 20 units tid with meals Current orders for Inpatient glycemic control:  Lantus 30 units daily, Novolog moderate q 4 hours, Solumedrol 60 mg IV q 6 hours  Inpatient Diabetes Program Recommendations:    Note that CBG's > goal.  Consider IV insulin per Glycemic control order set.  If IV insulin not appropriate, please consider adding Novolog tube feed coverage 5 units q 4 hours.  Thanks, Adah Perl, RN, BC-ADM Inpatient Diabetes Coordinator Pager [phone] (8a-5p)

## 2016-06-17 ENCOUNTER — Inpatient Hospital Stay (HOSPITAL_COMMUNITY): Payer: BLUE CROSS/BLUE SHIELD

## 2016-06-17 DIAGNOSIS — A4101 Sepsis due to Methicillin susceptible Staphylococcus aureus: Secondary | ICD-10-CM

## 2016-06-17 DIAGNOSIS — J15211 Pneumonia due to Methicillin susceptible Staphylococcus aureus: Secondary | ICD-10-CM

## 2016-06-17 LAB — CULTURE, RESPIRATORY W GRAM STAIN

## 2016-06-17 LAB — CBC WITH DIFFERENTIAL/PLATELET
BASOS PCT: 0 %
Basophils Absolute: 0 10*3/uL (ref 0.0–0.1)
Eosinophils Absolute: 0 10*3/uL (ref 0.0–0.7)
Eosinophils Relative: 0 %
HEMATOCRIT: 36.4 % (ref 36.0–46.0)
HEMOGLOBIN: 10.3 g/dL — AB (ref 12.0–15.0)
LYMPHS PCT: 7 %
Lymphs Abs: 1 10*3/uL (ref 0.7–4.0)
MCH: 26.8 pg (ref 26.0–34.0)
MCHC: 28.3 g/dL — AB (ref 30.0–36.0)
MCV: 94.8 fL (ref 78.0–100.0)
MONOS PCT: 6 %
Monocytes Absolute: 0.8 10*3/uL (ref 0.1–1.0)
NEUTROS ABS: 12 10*3/uL — AB (ref 1.7–7.7)
NEUTROS PCT: 87 %
Platelets: 171 10*3/uL (ref 150–400)
RBC: 3.84 MIL/uL — ABNORMAL LOW (ref 3.87–5.11)
RDW: 15.1 % (ref 11.5–15.5)
WBC: 13.7 10*3/uL — ABNORMAL HIGH (ref 4.0–10.5)

## 2016-06-17 LAB — BASIC METABOLIC PANEL
ANION GAP: 7 (ref 5–15)
BUN: 24 mg/dL — ABNORMAL HIGH (ref 6–20)
CHLORIDE: 95 mmol/L — AB (ref 101–111)
CO2: 33 mmol/L — AB (ref 22–32)
Calcium: 8.9 mg/dL (ref 8.9–10.3)
Creatinine, Ser: 0.68 mg/dL (ref 0.44–1.00)
GFR calc non Af Amer: >60 mL/min (ref >60)
Glucose, Bld: 239 mg/dL — ABNORMAL HIGH (ref 65–99)
Potassium: 4.5 mmol/L (ref 3.5–5.1)
Sodium: 135 mmol/L (ref 135–145)

## 2016-06-17 LAB — GLUCOSE, CAPILLARY
GLUCOSE-CAPILLARY: 230 mg/dL — AB (ref 65–99)
GLUCOSE-CAPILLARY: 124 mg/dL — AB (ref 65–99)
Glucose-Capillary: 186 mg/dL — ABNORMAL HIGH (ref 65–99)
Glucose-Capillary: 173 mg/dL — ABNORMAL HIGH (ref 65–99)
Glucose-Capillary: 220 mg/dL — ABNORMAL HIGH (ref 65–99)

## 2016-06-17 LAB — CULTURE, RESPIRATORY

## 2016-06-17 LAB — APTT: aPTT: 56 seconds — ABNORMAL HIGH (ref 24–36)

## 2016-06-17 LAB — HEPARIN LEVEL (UNFRACTIONATED): HEPARIN UNFRACTIONATED: 0.35 [IU]/mL (ref 0.30–0.70)

## 2016-06-17 MED ORDER — PREDNISONE 5 MG/ML PO CONC
60.0000 mg | Freq: Every day | ORAL | Status: DC
  Administered 2016-06-18 – 2016-06-19 (×2): 60 mg
  Filled 2016-06-17 (×2): qty 12

## 2016-06-17 MED ORDER — SENNOSIDES 8.8 MG/5ML PO SYRP
5.0000 mL | ORAL_SOLUTION | Freq: Two times a day (BID) | ORAL | Status: DC
  Administered 2016-06-17 – 2016-06-21 (×9): 5 mL
  Filled 2016-06-17 (×14): qty 5

## 2016-06-17 MED ORDER — DOCUSATE SODIUM 50 MG/5ML PO LIQD
100.0000 mg | Freq: Two times a day (BID) | ORAL | Status: DC
  Administered 2016-06-17 – 2016-06-21 (×9): 100 mg
  Filled 2016-06-17 (×12): qty 10

## 2016-06-17 MED ORDER — QUETIAPINE FUMARATE 25 MG PO TABS
25.0000 mg | ORAL_TABLET | Freq: Every day | ORAL | Status: DC
  Administered 2016-06-17 – 2016-06-22 (×5): 25 mg
  Filled 2016-06-17 (×7): qty 1

## 2016-06-17 NOTE — Progress Notes (Signed)
Decreased Peep 5 per MD order

## 2016-06-17 NOTE — Progress Notes (Signed)
[facility] for Heparin Indication: atrial fibrillation  Allergies  Allergen Reactions  . Ciprofloxacin Rash    REACTION: hives/rash    Patient Measurements: Height: '5\' 6"'$  (167.6 cm) Weight: 211 lb 6.7 oz (95.9 kg) IBW/kg (Calculated) : 59.3  Vital Signs: Temp: 99 F (37.2 C) (05/12 0747) Temp Source: Oral (05/12 0747) BP: 107/62 (05/12 0743) Pulse Rate: 64 (05/12 0743)  Labs:  Recent Labs  06/14/16 1353 06/14/16 1923  06/15/16 0039 06/15/16 0516 06/15/16 1128 06/16/16 0304 06/16/16 0536 06/17/16 0219  HGB 11.3*  --   --   --  11.1*  --  10.2*  --  10.3*  HCT 42.3  --   --   --  40.4  --  37.7  --  36.4  PLT 201  --   --   --  203  --  170  --  171  APTT 30  --   < >  --  71* 72*  --  77* 56*  HEPARINUNFRC 1.50*  --   < >  --  0.68  --   --  0.64 0.35  CREATININE 0.63  --   --   --  0.98  --  0.67  --  0.68  TROPONINI <0.03 <0.03  --  <0.03  --   --   --   --   --   < > = values in this interval not displayed.  Estimated Creatinine Clearance: 85.1 mL/min (by C-G formula based on SCr of 0.68 mg/dL).   Assessment: 35 yof continues on heparin for AFib while Xarelto on hold. Last dose of Xarelto was on 5/8 - should be out of her system by now. Heparin level and aPTT are just about correlating. Heparin level is on low end of therapeutic range, aPTT is barely subtherapeutic. Will make small adjustment in heparin infusion and will stop checking aPTTs.   Goal of Therapy:  Heparin level 0.3-0.7 units/ml aPTT 66-102 seconds Monitor platelets by anticoagulation protocol: Yes    Plan:  -Increase heparin to 1450 units/hr -Daily heparin level, CBC -Monitor for s/sx bleeding    Hughes Better, PharmD, BCPS Clinical Pharmacist 06/17/2016 10:13 AM

## 2016-06-17 NOTE — Progress Notes (Signed)
[facility] Attending Note  Presenting HPI:  61 y.o. female with PMH of COPD (4-5L [place] at baseline), DM, CAD and medical noncompliance. Recent hospitalization 10/26 for exacerbation requiring intubation. 5/9 presented to ED with 2 day reported dyspnea and increased confusion. Today husband states that he was unable to arouse patient so EMS was called. Per EMS oxygen saturation 73% on 5L [place]. Upon arrival to ED patient was placed on a nonrebreather at 15L and given Duoneb and solu-medrol. Initially had short term improvement however required intubation.  ABG 7.35/>100/233. PCCM asked to admit.   Imagining revealed worsening mediastinal mass, husband states that wife knew of this however, kept putting off following up. On 3/8 patient presented to outpatient pulmonary office (Dr. Chase Caller) - noted to have advanced COPD and cor pulmonale, patient stated she does not desire to use CPAP/BiPAP at home.   Subjective:  Failed precedex yesterday. Intolerant of SBT/PS wean with agitation.  Review of Systems:  Unable to obtain given intubation & sedation.   Vent Mode: PRVC FiO2 (%):  [40 %] 40 % Set Rate:  [20 bmp-22 bmp] 22 bmp Vt Set:  [460 mL] 460 mL PEEP:  [8 cmH20] 8 cmH20 Plateau Pressure:  [18 cmH20-22 cmH20] 18 cmH20  Temp:  [98.3 F (36.8 C)-101.2 F (38.4 C)] 99.4 F (37.4 C) (05/12 0357) Pulse Rate:  [56-105] 61 (05/12 0730) Resp:  [14-34] 22 (05/12 0730) BP: (93-181)/(54-136) 102/57 (05/12 0700) SpO2:  [92 %-100 %] 100 % (05/12 0730) FiO2 (%):  [40 %] 40 % (05/12 0600) Weight:  [211 lb 6.7 oz (95.9 kg)] 211 lb 6.7 oz (95.9 kg) (05/12 0500)   General:  No family at bedside. Intubated. No distress. Integument:  Warm & dry. No rash on exposed skin. Right sub-orbital facial lesion.  HEENT:  Moist mucus memebranes. No scleral icterus. Endotracheal tube in place. Neurological:  Pupils symmetric and pinpoint. Sedated. No spontaneous movements. Musculoskeletal:  No  joint effusion or erythema appreciated. Symmetric muscle bulk. Pulmonary:  Symmetric chest wall rise on ventilator. Clear breath sounds bilaterally. Cardiovascular:  Regular rate & rhythm. No appreciable JVD. Normal S1 & S2. Telemetry:  Sinus rhythm. Abdomen:  Soft. Nondistended. Hypoactive bowel sounds.  LINES/TUBES: OETT 7.5 5/9 >>> Foley 5/9 >>> OGT 5/9 >>> PIV  CBC Latest Ref Rng & Units 06/17/2016 06/16/2016 06/15/2016  WBC 4.0 - 10.5 K/uL 13.7(H) 16.3(H) 11.7(H)  Hemoglobin 12.0 - 15.0 g/dL 10.3(L) 10.2(L) 11.1(L)  Hematocrit 36.0 - 46.0 % 36.4 37.7 40.4  Platelets 150 - 400 K/uL 171 170 203    BMP Latest Ref Rng & Units 06/17/2016 06/16/2016 06/15/2016  Glucose 65 - 99 mg/dL 239(H) 279(H) 260(H)  BUN 6 - 20 mg/dL 24(H) 26(H) 29(H)  Creatinine 0.44 - 1.00 mg/dL 0.68 0.67 0.98  Sodium 135 - 145 mmol/L 135 133(L) 138  Potassium 3.5 - 5.1 mmol/L 4.5 4.6 4.7  Chloride 101 - 111 mmol/L 95(L) 86(L) 87(L)  CO2 22 - 32 mmol/L 33(H) 43(H) 44(H)  Calcium 8.9 - 10.3 mg/dL 8.9 8.4(L) 8.8(L)    Hepatic Function Latest Ref Rng & Units 06/14/2016 12/02/2015 05/21/2015  Total Protein 6.5 - 8.1 g/dL 7.2 7.1 6.9  Albumin 3.5 - 5.0 g/dL 2.8(L) 3.3(L) 2.7(L)  AST 15 - 41 U/L 17 34 12(L)  ALT 14 - 54 U/L 19 17 13(L)  Alk Phosphatase 38 - 126 U/L 85 75 65  Total Bilirubin 0.3 - 1.2 mg/dL 0.7 1.8(H) 0.9    IMAGING/STUDIES: CT CHEST W/  5/9: IMPRESSION: 1. Well-positioned support structures as detailed . 2. Central left upper lobe 4.7 cm lung mass abutting the mediastinal pleura and left upper hilum, increased in size back to the October 2017 radiographs, compatible with primary bronchogenic carcinoma. 3. AP window and right paratracheal mediastinal lymphadenopathy, suspicious for nodal metastases. 4. Indeterminate left adrenal nodule, metastasis not excluded. 5. Patchy consolidation in the peripheral left upper lobe, compatible with postobstructive pneumonia . 6. Small dependent bilateral pleural  effusions with associated lower lobe atelectasis. 7. Moderate to severe emphysema with diffuse bronchial wall thickening, suggesting COPD . 8. 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 9. Aortic atherosclerosis. 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 PORT CXR 5/10: Left perihilar mass appreciated. Endotracheal tube in good position. Enteric feeding tube coursing below diaphragm. Sternotomy wires noted. Bilateral lower lobe opacification. TTE 5/11: LV EF 60-65% with basal inferior hypokinesis and normal cavity size. Mild LVH. Grade 1 diastolic dysfunction. LA & RA normal in size. RV normal in size and function. No aortic regurgitation. No significant mitral regurgitation. No pulmonic regurgitation. Trivial tricuspid regurgitation. No pericardial effusion. PORT CXR 5/12:  Personally reviewed by me. Enteric feeding tube coursing below diaphragm. Endotracheal tube in good position. Bilateral lower lung opacities unchanged.  MICROBIOLOGY: MRSA PCR 5/9: Negative  HIV 5/9: Nonreactive  Blood Cultures x2 5/9 >> Tracheal Aspirate Culture 5/9 >>moderate staph aureus  Respiratory Viral Panel PCR 5/10:  Negative   ANTIBIOTICS: Doxycycline 5/9 >>  SIGNIFICANT EVENTS: 05/09 - Admit 05/11 - Failed Precedex  05/12 - Started bowel regimen & Seroquel qhs  ASSESSMENT/PLAN:  61 y.o. female with advanced COPD and emphysema and chronic hypoxic respiratory failure. Presented with acute on chronic hypoxic respiratory failure and hypercarbic respiratory failure. Secondary to pneumonia given findings on x-ray imaging. Slowly improving with regards to respiratory status.  1. Acute on chronic hypoxic and hypercarbic respiratory failure: Multifactorial. Minimizing hyperoxygenation to prevent V/Q mismatching. Continuing Duonebs scheduled & transitioning to prednisone 60 mg daily starting tomorrow. 2. Staphylococcus aureus pneumonia: Awaiting sensitivities. Continuing  doxycycline day #4. 3. Sepsis: Secondary to Staphylococcus aureus pneumonia. Awaiting culture results. 4. Diabetes most type II: Glucose is trending downward. Continuing steroid weaning should help. Continuing Lantus & Accu-Cheks every 4 hours with sliding scale insulin coverage. 5. Chronic cor pulmonale & diastolic congestive heart failure: No evidence of acute decompensation. Status post Lasix and Diamox. Holding on further diuresis. 6. Coronary artery disease: No evidence of ischemia. Monitoring patient on telemetry. 7. Coagulopathy: Secondary to Xarelto. No clear reason for systemic anticoagulation prior to admission. Continuing heparin drip per pharmacy protocol. 8. Acute encephalopathy: Multifactorial but suspect some element of delirium. Failed Precedex. Starting Seroquel 25 mg daily at bedtime. Repeat EKG in the morning.  9. Lactic acidosis: Resolved. 10. Anemia: No active bleeding. Trending cell counts daily.  Prophylaxis: Protonix via tube daily. Heparin drip per pharmacy protocol.  Diet: NPO. Continuing tube feedings per dietary recommendations.  Code Status: Full Code per previous physician discussions.  Disposition: Remaining critically ill in the ICU.  Family Update:  Husband updated on 5/11 by NP & 5/10 by Dr. Ashok Cordia.   I have personally spent a total of 33 minutes of critical care time today caring for the patient & reviewing the patient's electronic medical record.  Sonia Baller Ashok Cordia, [name] [facility] Pulmonary & Critical Care Pager:  [phone] After 3pm or if no response, call [phone] 7:46 AM 06/17/16

## 2016-06-17 NOTE — Progress Notes (Signed)
Increased Peep 8 and FIO2 40 due to low sats

## 2016-06-17 NOTE — Progress Notes (Signed)
Decreased FIO2 30% due to stable sats

## 2016-06-18 ENCOUNTER — Inpatient Hospital Stay (HOSPITAL_COMMUNITY): Payer: BLUE CROSS/BLUE SHIELD

## 2016-06-18 LAB — BASIC METABOLIC PANEL
Anion gap: 8 (ref 5–15)
BUN: 25 mg/dL — ABNORMAL HIGH (ref 6–20)
CALCIUM: 8.7 mg/dL — AB (ref 8.9–10.3)
CO2: 32 mmol/L (ref 22–32)
Chloride: 97 mmol/L — ABNORMAL LOW (ref 101–111)
Creatinine, Ser: 0.6 mg/dL (ref 0.44–1.00)
Glucose, Bld: 125 mg/dL — ABNORMAL HIGH (ref 65–99)
Potassium: 4.2 mmol/L (ref 3.5–5.1)
SODIUM: 137 mmol/L (ref 135–145)

## 2016-06-18 LAB — CBC WITH DIFFERENTIAL/PLATELET
BASOS ABS: 0 10*3/uL (ref 0.0–0.1)
BASOS PCT: 0 %
EOS ABS: 0.1 10*3/uL (ref 0.0–0.7)
EOS PCT: 1 %
HCT: 36.3 % (ref 36.0–46.0)
Hemoglobin: 10.5 g/dL — ABNORMAL LOW (ref 12.0–15.0)
Lymphocytes Relative: 13 %
Lymphs Abs: 1.6 10*3/uL (ref 0.7–4.0)
MCH: 27.3 pg (ref 26.0–34.0)
MCHC: 28.9 g/dL — AB (ref 30.0–36.0)
MCV: 94.5 fL (ref 78.0–100.0)
MONO ABS: 0.9 10*3/uL (ref 0.1–1.0)
MONOS PCT: 7 %
Neutro Abs: 9.9 10*3/uL — ABNORMAL HIGH (ref 1.7–7.7)
Neutrophils Relative %: 79 %
PLATELETS: 139 10*3/uL — AB (ref 150–400)
RBC: 3.84 MIL/uL — ABNORMAL LOW (ref 3.87–5.11)
RDW: 15.8 % — ABNORMAL HIGH (ref 11.5–15.5)
WBC: 12.4 10*3/uL — ABNORMAL HIGH (ref 4.0–10.5)

## 2016-06-18 LAB — GLUCOSE, CAPILLARY
GLUCOSE-CAPILLARY: 138 mg/dL — AB (ref 65–99)
GLUCOSE-CAPILLARY: 175 mg/dL — AB (ref 65–99)
Glucose-Capillary: 123 mg/dL — ABNORMAL HIGH (ref 65–99)
Glucose-Capillary: 142 mg/dL — ABNORMAL HIGH (ref 65–99)
Glucose-Capillary: 169 mg/dL — ABNORMAL HIGH (ref 65–99)
Glucose-Capillary: 199 mg/dL — ABNORMAL HIGH (ref 65–99)
Glucose-Capillary: 275 mg/dL — ABNORMAL HIGH (ref 65–99)

## 2016-06-18 LAB — HEPARIN LEVEL (UNFRACTIONATED): HEPARIN UNFRACTIONATED: 0.27 [IU]/mL — AB (ref 0.30–0.70)

## 2016-06-18 MED ORDER — BISACODYL 10 MG RE SUPP
10.0000 mg | Freq: Once | RECTAL | Status: AC
  Administered 2016-06-18: 10 mg via RECTAL
  Filled 2016-06-18: qty 1

## 2016-06-18 MED ORDER — HEPARIN SODIUM (PORCINE) 5000 UNIT/ML IJ SOLN
5000.0000 [IU] | Freq: Three times a day (TID) | INTRAMUSCULAR | Status: DC
  Administered 2016-06-18 – 2016-06-21 (×10): 5000 [IU] via SUBCUTANEOUS
  Filled 2016-06-18 (×12): qty 1

## 2016-06-18 NOTE — Progress Notes (Signed)
[place] [facility] Attending Note  Presenting HPI:  61 y.o. female with PMH of COPD (4-5L [place] at baseline), DM, CAD and medical noncompliance. Recent hospitalization 10/26 for exacerbation requiring intubation. 5/9 presented to ED with 2 day reported dyspnea and increased confusion. Today husband states that he was unable to arouse patient so EMS was called. Per EMS oxygen saturation 73% on 5L [place]. Upon arrival to ED patient was placed on a nonrebreather at 15L and given Duoneb and solu-medrol. Initially had short term improvement however required intubation.  ABG 7.35/>100/233. PCCM asked to admit.   Imagining revealed worsening mediastinal mass, husband states that wife knew of this however, kept putting off following up. On 3/8 patient presented to outpatient pulmonary office (Dr. Chase Caller) - noted to have advanced COPD and cor pulmonale, patient stated she does not desire to use CPAP/BiPAP at home.   Subjective:  No acute events overnight. PEEP was increased due to "low sats" overnight.   Review of Systems:  Unable to obtain given intubation & sedation.   Vent Mode: PRVC FiO2 (%):  [30 %-40 %] 40 % Set Rate:  [22 bmp] 22 bmp Vt Set:  [460 mL] 460 mL PEEP:  [5 cmH20-8 cmH20] 8 cmH20 Plateau Pressure:  [18 cmH20-28 cmH20] 20 cmH20  Temp:  [98.3 F (36.8 C)-100.3 F (37.9 C)] 100.3 F (37.9 C) (05/13 0732) Pulse Rate:  [59-104] 81 (05/13 0900) Resp:  [17-22] 22 (05/13 0900) BP: (91-128)/(48-102) 128/92 (05/13 0900) SpO2:  [84 %-99 %] 93 % (05/13 0900) FiO2 (%):  [30 %-40 %] 40 % (05/13 0806) Weight:  [212 lb 8.4 oz (96.4 kg)] 212 lb 8.4 oz (96.4 kg) (05/13 0500)   General: Husband at bedside. No distress. Endotracheal intubation. Integument: No rash on exposed skin. Warm. Dry. Abdomen: Soft. Improving bowel sounds. Protuberant. Neurological: Attends to voice. Doesn't follow commands. Currently on sedation. Pulmonary: Good aeration bilaterally. Clear to auscultation.  Symmetric chest wall rise on ventilator. Cardiovascular: Regular rate. Regular rhythm. No edema.  LINES/TUBES: OETT 7.5 5/9 >>> Foley 5/9 >>> OGT 5/9 >>> PIV  CBC Latest Ref Rng & Units 06/18/2016 06/17/2016 06/16/2016  WBC 4.0 - 10.5 K/uL 12.4(H) 13.7(H) 16.3(H)  Hemoglobin 12.0 - 15.0 g/dL 10.5(L) 10.3(L) 10.2(L)  Hematocrit 36.0 - 46.0 % 36.3 36.4 37.7  Platelets 150 - 400 K/uL 139(L) 171 170    BMP Latest Ref Rng & Units 06/18/2016 06/17/2016 06/16/2016  Glucose 65 - 99 mg/dL 125(H) 239(H) 279(H)  BUN 6 - 20 mg/dL 25(H) 24(H) 26(H)  Creatinine 0.44 - 1.00 mg/dL 0.60 0.68 0.67  Sodium 135 - 145 mmol/L 137 135 133(L)  Potassium 3.5 - 5.1 mmol/L 4.2 4.5 4.6  Chloride 101 - 111 mmol/L 97(L) 95(L) 86(L)  CO2 22 - 32 mmol/L 32 33(H) 43(H)  Calcium 8.9 - 10.3 mg/dL 8.7(L) 8.9 8.4(L)    Hepatic Function Latest Ref Rng & Units 06/14/2016 12/02/2015 05/21/2015  Total Protein 6.5 - 8.1 g/dL 7.2 7.1 6.9  Albumin 3.5 - 5.0 g/dL 2.8(L) 3.3(L) 2.7(L)  AST 15 - 41 U/L 17 34 12(L)  ALT 14 - 54 U/L 19 17 13(L)  Alk Phosphatase 38 - 126 U/L 85 75 65  Total Bilirubin 0.3 - 1.2 mg/dL 0.7 1.8(H) 0.9    IMAGING/STUDIES: CT CHEST W/ 5/9: IMPRESSION: 1. Well-positioned support structures as detailed . 2. Central left upper lobe 4.7 cm lung mass abutting the mediastinal pleura and left upper hilum, increased in size back to the October 2017 radiographs,  compatible with primary bronchogenic carcinoma. 3. AP window and right paratracheal mediastinal lymphadenopathy, suspicious for nodal metastases. 4. Indeterminate left adrenal nodule, metastasis not excluded. 5. Patchy consolidation in the peripheral left upper lobe, compatible with postobstructive pneumonia . 6. Small dependent bilateral pleural effusions with associated lower lobe atelectasis. 7. Moderate to severe emphysema with diffuse bronchial wall thickening, suggesting COPD . 8. 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 9.  Aortic atherosclerosis. 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 PORT CXR 5/10: Left perihilar mass appreciated. Endotracheal tube in good position. Enteric feeding tube coursing below diaphragm. Sternotomy wires noted. Bilateral lower lobe opacification. TTE 5/11: LV EF 60-65% with basal inferior hypokinesis and normal cavity size. Mild LVH. Grade 1 diastolic dysfunction. LA & RA normal in size. RV normal in size and function. No aortic regurgitation. No significant mitral regurgitation. No pulmonic regurgitation. Trivial tricuspid regurgitation. No pericardial effusion. PORT CXR 5/12:  Previously reviewed by me. Enteric feeding tube coursing below diaphragm. Endotracheal tube in good position. Bilateral lower lung opacities unchanged.  MICROBIOLOGY: MRSA PCR 5/9: Negative  HIV 5/9: Nonreactive  Blood Cultures x2 5/9 >> Tracheal Aspirate Culture 5/9:  Moderate Staphylococcus aureus (pan-sensitive) Respiratory Viral Panel PCR 5/10:  Negative   ANTIBIOTICS: Doxycycline 5/9 >>  SIGNIFICANT EVENTS: 05/09 - Admit 05/11 - Failed Precedex  05/12 - Started bowel regimen & Seroquel qhs  ASSESSMENT/PLAN:  61 y.o. female with advanced COPD and acute on chronic hypoxic respiratory failure on admission. Patient more calm today on Seroquel. Still has not yet had a bowel movement.   1. Acute on chronic hypoxic and hypercarbic respiratory failure: Continuing daily spontaneous breathing trial and pressure support wean. Continuing scheduled Duonebs. Continuing prednisone daily starting today. Minimizing V/Q mismatching. 2. MSSA pneumonia: Continuing doxycycline day #5/10. Plan for repeat chest x-ray/imaging in 4-6 weeks. 3. Sepsis: Secondary to MSSA pneumonia. Awaiting finalization of blood culture. 4. Constipation: Dulcolax suppository. Continuing bowel regimen. 5. Diabetes mellitus type 2: Significant improvement in glucose control. Continuing Lantus & sliding scale insulin  with Accu-Cheks every 4 hours. 6. Chronic cor pulmonale & diastolic congestive heart failure: No evidence of acute decompensation. Holding on further diuresis. 7. History of TIA: Patient placed on anticoagulation years ago. Discontinuing heparin drip. 8. Thrombocytopenia: Unclear etiology. Trending cell counts daily. 9. Coronary artery disease: No evidence of ischemia. 10. Acute encephalopathy: Failed Precedex. Continuing Seroquel 25 mg daily. 11. Lactic acidosis: Resolved. 12. Anemia: No active bleeding. Hemoglobin stable. Trending cell counts daily with CBC.  Prophylaxis: Protonix via tube daily. Discontinuing heparin drip. Continuing SCDs & starting heparin subcutaneous every 8 hours. Diet: NPO. Continuing tube feedings per dietary recommendations.  Code Status: Full Code per previous physician discussions.  Disposition: Remaining critically ill in the ICU.  Family Update:  Husband updated at bedside 5/13 by Dr. Ashok Cordia.   I have spent a total of 32 minutes of critical care time today caring for the patient, updating her husband at bedside, and reviewing the patient's electronic medical record.   Sonia Baller Ashok Cordia, [name] [facility] Pulmonary & Critical Care Pager:  [phone] After 3pm or if no response, call [phone] 9:57 AM 06/18/16

## 2016-06-18 NOTE — Progress Notes (Signed)
[place] for Heparin Indication: atrial fibrillation  Allergies  Allergen Reactions  . Ciprofloxacin Rash    REACTION: hives/rash    Patient Measurements: Height: '5\' 6"'$  (167.6 cm) Weight: 212 lb 8.4 oz (96.4 kg) IBW/kg (Calculated) : 59.3  Vital Signs: Temp: 98.3 F (36.8 C) (05/13 0428) Temp Source: Oral (05/13 0428) BP: 110/53 (05/13 0600) Pulse Rate: 88 (05/13 0600)  Labs:  Recent Labs  06/15/16 1128  06/16/16 0304 06/16/16 0536 06/17/16 0219 06/18/16 0303  HGB  --   < > 10.2*  --  10.3* 10.5*  HCT  --   --  37.7  --  36.4 36.3  PLT  --   --  170  --  171 139*  APTT 72*  --   --  77* 56*  --   HEPARINUNFRC  --   --   --  0.64 0.35 0.27*  CREATININE  --   --  0.67  --  0.68 0.60  < > = values in this interval not displayed.  Estimated Creatinine Clearance: 85.3 mL/min (by C-G formula based on SCr of 0.6 mg/dL).  Assessment: 45 yof continues on heparin for AFib while Xarelto on hold. Last dose of Xarelto was on 5/8 - should be out of her system by now. Heparin level and aPTT have correlated so following anti-xa levels alone now.   Heparin level is just below therapeutic range this morning. Will make small adjustment in heparin infusion. Hgb has remained stable, no bleeding issues have been noted. Pltc has dropped overnight from 170s to 139 will continue to follow.   Goal of Therapy:  Heparin level 0.3-0.7 units/ml Monitor platelets by anticoagulation protocol: Yes    Plan:  -Increase heparin to 1600 units/hr -Daily heparin level, CBC -Monitor for s/sx bleeding  Erin Hearing PharmD., BCPS Clinical Pharmacist Pager [phone] 06/18/2016 7:33 AM

## 2016-06-19 ENCOUNTER — Inpatient Hospital Stay (HOSPITAL_COMMUNITY): Payer: BLUE CROSS/BLUE SHIELD

## 2016-06-19 LAB — BLOOD GAS, ARTERIAL
Acid-Base Excess: 6.8 mmol/L — ABNORMAL HIGH (ref 0.0–2.0)
BICARBONATE: 32 mmol/L — AB (ref 20.0–28.0)
Drawn by: 437071
FIO2: 40
O2 Saturation: 92.6 %
PEEP/CPAP: 8 cmH2O
Patient temperature: 99.4
RATE: 22 resp/min
VT: 460 mL
pCO2 arterial: 57.9 mmHg — ABNORMAL HIGH (ref 32.0–48.0)
pH, Arterial: 7.364 (ref 7.350–7.450)
pO2, Arterial: 70.4 mmHg — ABNORMAL LOW (ref 83.0–108.0)

## 2016-06-19 LAB — CBC WITH DIFFERENTIAL/PLATELET
Basophils Absolute: 0 10*3/uL (ref 0.0–0.1)
Basophils Relative: 0 %
EOS PCT: 1 %
Eosinophils Absolute: 0.1 10*3/uL (ref 0.0–0.7)
HCT: 38.6 % (ref 36.0–46.0)
HEMOGLOBIN: 11.1 g/dL — AB (ref 12.0–15.0)
LYMPHS ABS: 1.7 10*3/uL (ref 0.7–4.0)
Lymphocytes Relative: 16 %
MCH: 27.3 pg (ref 26.0–34.0)
MCHC: 28.8 g/dL — ABNORMAL LOW (ref 30.0–36.0)
MCV: 94.8 fL (ref 78.0–100.0)
MONOS PCT: 6 %
Monocytes Absolute: 0.6 10*3/uL (ref 0.1–1.0)
NEUTROS PCT: 77 %
Neutro Abs: 8.2 10*3/uL — ABNORMAL HIGH (ref 1.7–7.7)
Platelets: 139 10*3/uL — ABNORMAL LOW (ref 150–400)
RBC: 4.07 MIL/uL (ref 3.87–5.11)
RDW: 15.6 % — ABNORMAL HIGH (ref 11.5–15.5)
WBC: 10.6 10*3/uL — ABNORMAL HIGH (ref 4.0–10.5)

## 2016-06-19 LAB — RENAL FUNCTION PANEL
ALBUMIN: 2.4 g/dL — AB (ref 3.5–5.0)
ANION GAP: 8 (ref 5–15)
BUN: 22 mg/dL — ABNORMAL HIGH (ref 6–20)
CALCIUM: 8.9 mg/dL (ref 8.9–10.3)
CO2: 35 mmol/L — AB (ref 22–32)
Chloride: 98 mmol/L — ABNORMAL LOW (ref 101–111)
Creatinine, Ser: 0.57 mg/dL (ref 0.44–1.00)
GFR calc non Af Amer: >60 mL/min (ref >60)
Glucose, Bld: 145 mg/dL — ABNORMAL HIGH (ref 65–99)
PHOSPHORUS: 3 mg/dL (ref 2.5–4.6)
POTASSIUM: 3.9 mmol/L (ref 3.5–5.1)
SODIUM: 141 mmol/L (ref 135–145)

## 2016-06-19 LAB — GLUCOSE, CAPILLARY
Glucose-Capillary: 148 mg/dL — ABNORMAL HIGH (ref 65–99)
Glucose-Capillary: 147 mg/dL — ABNORMAL HIGH (ref 65–99)
Glucose-Capillary: 215 mg/dL — ABNORMAL HIGH (ref 65–99)
Glucose-Capillary: 226 mg/dL — ABNORMAL HIGH (ref 65–99)
Glucose-Capillary: 141 mg/dL — ABNORMAL HIGH (ref 65–99)

## 2016-06-19 LAB — CULTURE, BLOOD (ROUTINE X 2)
Culture: NO GROWTH
Culture: NO GROWTH
SPECIAL REQUESTS: ADEQUATE
Special Requests: ADEQUATE

## 2016-06-19 LAB — MAGNESIUM: MAGNESIUM: 2 mg/dL (ref 1.7–2.4)

## 2016-06-19 MED ORDER — ATORVASTATIN CALCIUM 10 MG PO TABS
20.0000 mg | ORAL_TABLET | Freq: Every day | ORAL | Status: DC
  Administered 2016-06-20 – 2016-06-22 (×3): 20 mg
  Filled 2016-06-19: qty 2
  Filled 2016-06-19: qty 1
  Filled 2016-06-19 (×2): qty 2
  Filled 2016-06-19: qty 1

## 2016-06-19 MED ORDER — PREDNISONE 5 MG/ML PO CONC
40.0000 mg | Freq: Every day | ORAL | Status: DC
  Administered 2016-06-20: 40 mg
  Filled 2016-06-19: qty 8

## 2016-06-19 MED ORDER — ORAL CARE MOUTH RINSE
15.0000 mL | Freq: Two times a day (BID) | OROMUCOSAL | Status: DC
  Administered 2016-06-20 – 2016-06-21 (×2): 15 mL via OROMUCOSAL

## 2016-06-19 MED ORDER — METOPROLOL TARTRATE 25 MG PO TABS
25.0000 mg | ORAL_TABLET | Freq: Two times a day (BID) | ORAL | Status: DC
  Administered 2016-06-19: 25 mg via ORAL
  Filled 2016-06-19: qty 1
  Filled 2016-06-19: qty 2
  Filled 2016-06-19: qty 1

## 2016-06-19 MED ORDER — FENTANYL CITRATE (PF) 100 MCG/2ML IJ SOLN
50.0000 ug | INTRAMUSCULAR | Status: DC | PRN
  Administered 2016-06-19 – 2016-06-21 (×5): 50 ug via INTRAVENOUS
  Filled 2016-06-19 (×5): qty 2

## 2016-06-19 MED ORDER — GABAPENTIN 100 MG PO CAPS
100.0000 mg | ORAL_CAPSULE | Freq: Three times a day (TID) | ORAL | Status: DC
  Administered 2016-06-19: 100 mg via ORAL
  Filled 2016-06-19 (×3): qty 1

## 2016-06-19 MED ORDER — GABAPENTIN 250 MG/5ML PO SOLN
100.0000 mg | Freq: Three times a day (TID) | ORAL | Status: DC
  Administered 2016-06-20 – 2016-06-22 (×9): 100 mg
  Filled 2016-06-19 (×15): qty 2

## 2016-06-19 MED ORDER — METOPROLOL TARTRATE 25 MG/10 ML ORAL SUSPENSION
25.0000 mg | Freq: Two times a day (BID) | ORAL | Status: DC
  Administered 2016-06-20 – 2016-06-23 (×6): 25 mg
  Filled 2016-06-19 (×9): qty 10

## 2016-06-19 MED ORDER — CHLORHEXIDINE GLUCONATE 0.12 % MT SOLN
15.0000 mL | Freq: Two times a day (BID) | OROMUCOSAL | Status: DC
  Administered 2016-06-20 – 2016-06-23 (×6): 15 mL via OROMUCOSAL
  Filled 2016-06-19 (×6): qty 15

## 2016-06-19 NOTE — Progress Notes (Signed)
Patient with very weak congested cough. Attempted sip of water. Patient sounding garbled with hoarse voice. Coughing after sip of water. Will hold P.O. meds as not confident patient is ready to start swallowing.Also patient with tea colored urine, more obvious in urometer bag once collected than tubing. Called to [facility] MD, aware of concerns. Will continue to monitor.

## 2016-06-19 NOTE — Progress Notes (Signed)
PCCM Progress Note  Admission date: 06/14/2016 Referring provider: Dr. Sherry Ruffing, ER  CC: Short of breath  HPI: 61 yo female former with dyspnea and confusion, hypoxia from COPD exacerbation.  She required intubation.  Noted to have enlarging Lt perihilar mass.  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  Subjective: Tolerating pressure support.  Vital signs: BP (!) 123/54   Pulse (!) 105   Temp (!) 100.7 F (38.2 C) (Oral) Comment: RN Linda aware   Resp (!) 22   Ht '5\' 6"'$  (1.676 m)   Wt 209 lb 10.5 oz (95.1 kg)   SpO2 (!) 88%   BMI 33.84 kg/m   Intake/output: I/O last 3 completed shifts: In: 2463.6 [I.V.:1413.6; NG/GT:1050] Out: 3668 [Urine:3668]  General: alert Neuro: RASS 0, follows simple commands HEENT: ETT in place Cardiac: regular, tachycardic Chest: decreased BS, no wheeze Abd: soft, non tender Ext: no edema Skin: no rashes   CMP Latest Ref Rng & Units 06/19/2016 06/18/2016 06/17/2016  Glucose 65 - 99 mg/dL 145(H) 125(H) 239(H)  BUN 6 - 20 mg/dL 22(H) 25(H) 24(H)  Creatinine 0.44 - 1.00 mg/dL 0.57 0.60 0.68  Sodium 135 - 145 mmol/L 141 137 135  Potassium 3.5 - 5.1 mmol/L 3.9 4.2 4.5  Chloride 101 - 111 mmol/L 98(L) 97(L) 95(L)  CO2 22 - 32 mmol/L 35(H) 32 33(H)  Calcium 8.9 - 10.3 mg/dL 8.9 8.7(L) 8.9  Total Protein 6.5 - 8.1 g/dL - - -  Total Bilirubin 0.3 - 1.2 mg/dL - - -  Alkaline Phos 38 - 126 U/L - - -  AST 15 - 41 U/L - - -  ALT 14 - 54 U/L - - -     CBC Latest Ref Rng & Units 06/19/2016 06/18/2016 06/17/2016  WBC 4.0 - 10.5 K/uL 10.6(H) 12.4(H) 13.7(H)  Hemoglobin 12.0 - 15.0 g/dL 11.1(L) 10.5(L) 10.3(L)  Hematocrit 36.0 - 46.0 % 38.6 36.3 36.4  Platelets 150 - 400 K/uL 139(L) 139(L) 171     ABG    Component Value Date/Time   PHART 7.364 06/18/2016 0410   PCO2ART 57.9 (H) 06/18/2016 0410   PO2ART 70.4 (L) 06/18/2016 0410   HCO3 32.0 (H) 06/18/2016 0410   TCO2 >50 06/15/2016 1303   O2SAT 92.6 06/18/2016 0410     CBG  (last 3)   Recent Labs  06/18/16 2346 06/19/16 0443 06/19/16 0727  GLUCAP 169* 148* 147*     Imaging: Dg Abd Portable 1v  Result Date: 06/18/2016 CLINICAL DATA:  Abdominal distention.  NG tube placement. EXAM: PORTABLE ABDOMEN - 1 VIEW COMPARISON:  12/03/2015 FINDINGS: An NG tube is identified with tip overlying the distal stomach. Distended gas-filled bowel loops are noted, mouth wrist with represent colon. IMPRESSION: NG tube with tip overlying the distal stomach. Electronically Signed   By: Margarette Canada [name]   On: 06/18/2016 19:55     Studies: CT chest 5/09 >> CAD, borderline LAN, small b/l effusions, bronchomalacia, mod/severe centrilobular emphysema, 4.7 cm mass along mediastinal pleura and Lt hilum Echo 5/11 >> EF 60 to 65%, grade 1 DD  Antibiotics: Doxycycline 5/09 >>   Cultures: RSV 5/09 >> negative Sputum 5/09 >> MSSA Blood 5/09 >>  Lines/tubes: ETT 5/09 >>   Events: 5/09 Admit 5/11 Failed precedex 5/12 Add seroquel, bowel regimen  Assessment/plan:  Acute on chronic hypoxic/hypercapnic respiratory failure 2nd to AECOPD. 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 - continue seroquel - restart neurontin - RASS goal 0  DM type II. - SSI with lantus  Chronic diastolic CHF.  Hx of CAD, CVA, HLD. - lipitor  DVT prophylaxis - SQ heparin SUP - protonix Nutrition - tube feeds Goals of care - full code  CC time 32 minutes  Updated pt's husband at bedside.  Chesley Mires, MD [facility] Pulmonary/Critical Care 06/19/2016, 10:14 AM Pager:  [phone] After 3pm call: [phone]

## 2016-06-19 NOTE — Progress Notes (Signed)
Pt extubated per MD order. Pt tol well. Pt able to vocalize after extubation, few words like name and DOB. Pt was initially placed on 6L [place] and sats dropped to 85%. RT placed patient on 55% VM, pt seems to be mouth breather. Will cont to monitor. No stridor or resp distress noted at this time.

## 2016-06-20 ENCOUNTER — Inpatient Hospital Stay (HOSPITAL_COMMUNITY): Payer: BLUE CROSS/BLUE SHIELD

## 2016-06-20 DIAGNOSIS — Z9911 Dependence on respirator [ventilator] status: Secondary | ICD-10-CM

## 2016-06-20 LAB — CBC
HCT: 38.4 % (ref 36.0–46.0)
Hemoglobin: 11 g/dL — ABNORMAL LOW (ref 12.0–15.0)
MCH: 27.2 pg (ref 26.0–34.0)
MCHC: 28.6 g/dL — ABNORMAL LOW (ref 30.0–36.0)
MCV: 95 fL (ref 78.0–100.0)
Platelets: 177 10*3/uL (ref 150–400)
RBC: 4.04 MIL/uL (ref 3.87–5.11)
RDW: 15.2 % (ref 11.5–15.5)
WBC: 14.2 10*3/uL — AB (ref 4.0–10.5)

## 2016-06-20 LAB — GLUCOSE, CAPILLARY
GLUCOSE-CAPILLARY: 142 mg/dL — AB (ref 65–99)
GLUCOSE-CAPILLARY: 149 mg/dL — AB (ref 65–99)
GLUCOSE-CAPILLARY: 82 mg/dL (ref 65–99)
Glucose-Capillary: 110 mg/dL — ABNORMAL HIGH (ref 65–99)
Glucose-Capillary: 77 mg/dL (ref 65–99)
Glucose-Capillary: 91 mg/dL (ref 65–99)
Glucose-Capillary: 98 mg/dL (ref 65–99)

## 2016-06-20 LAB — BASIC METABOLIC PANEL
ANION GAP: 5 (ref 5–15)
BUN: 15 mg/dL (ref 6–20)
CALCIUM: 9 mg/dL (ref 8.9–10.3)
CO2: 37 mmol/L — ABNORMAL HIGH (ref 22–32)
CREATININE: 0.5 mg/dL (ref 0.44–1.00)
Chloride: 97 mmol/L — ABNORMAL LOW (ref 101–111)
Glucose, Bld: 94 mg/dL (ref 65–99)
Potassium: 3.6 mmol/L (ref 3.5–5.1)
SODIUM: 139 mmol/L (ref 135–145)

## 2016-06-20 LAB — PHOSPHORUS: PHOSPHORUS: 3.1 mg/dL (ref 2.5–4.6)

## 2016-06-20 LAB — MAGNESIUM: MAGNESIUM: 1.7 mg/dL (ref 1.7–2.4)

## 2016-06-20 MED ORDER — POLYETHYLENE GLYCOL 3350 17 G PO PACK
17.0000 g | PACK | Freq: Every day | ORAL | Status: DC
  Administered 2016-06-20 – 2016-06-21 (×2): 17 g via ORAL
  Filled 2016-06-20 (×3): qty 1

## 2016-06-20 MED ORDER — ONDANSETRON HCL 4 MG/2ML IJ SOLN
4.0000 mg | Freq: Four times a day (QID) | INTRAMUSCULAR | Status: DC | PRN
  Administered 2016-06-20 – 2016-06-22 (×4): 4 mg via INTRAVENOUS
  Filled 2016-06-20 (×5): qty 2

## 2016-06-20 MED ORDER — PREDNISONE 5 MG/ML PO CONC
20.0000 mg | Freq: Every day | ORAL | Status: DC
  Administered 2016-06-21 – 2016-06-23 (×3): 20 mg
  Filled 2016-06-20 (×3): qty 4

## 2016-06-20 MED ORDER — IPRATROPIUM-ALBUTEROL 0.5-2.5 (3) MG/3ML IN SOLN
3.0000 mL | Freq: Three times a day (TID) | RESPIRATORY_TRACT | Status: DC
  Administered 2016-06-21 – 2016-06-23 (×7): 3 mL via RESPIRATORY_TRACT
  Filled 2016-06-20 (×8): qty 3

## 2016-06-20 MED ORDER — CEFAZOLIN SODIUM-DEXTROSE 1-4 GM/50ML-% IV SOLN
1.0000 g | Freq: Three times a day (TID) | INTRAVENOUS | Status: DC
Start: 2016-06-20 — End: 2016-06-22
  Administered 2016-06-20 – 2016-06-22 (×6): 1 g via INTRAVENOUS
  Filled 2016-06-20 (×8): qty 50

## 2016-06-20 NOTE — Progress Notes (Signed)
PCCM Progress Note  Admission date: 06/14/2016 Referring provider: Dr. Sherry Ruffing, ER  CC: Short of breath  HPI: 61 yo female former with dyspnea and confusion, hypoxia from COPD exacerbation.  She required intubation.  Noted to have enlarging Lt perihilar mass.  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  Subjective: Extubated No distress  6 liters.  Vital signs: BP (!) 124/58   Pulse 85   Temp 98.5 F (36.9 C) (Oral)   Resp 15   Ht '5\' 6"'$  (1.676 m)   Wt 92.9 kg (204 lb 12.9 oz)   SpO2 97%   BMI 33.06 kg/m   Intake/output: I/O last 3 completed shifts: In: 1228.3 [I.V.:628.3; NG/GT:600] Out: 2955 [Urine:2955]  General:awake, alert, Neuro: HEENT: jvd wnl, no stridor PULM: distant CV: s1 s2 RRR no r GI: soft,. BS wnl, no r.g, failed swallow has NGT Extremities: edema none    CMP Latest Ref Rng & Units 06/20/2016 06/19/2016 06/18/2016  Glucose 65 - 99 mg/dL 94 145(H) 125(H)  BUN 6 - 20 mg/dL 15 22(H) 25(H)  Creatinine 0.44 - 1.00 mg/dL 0.50 0.57 0.60  Sodium 135 - 145 mmol/L 139 141 137  Potassium 3.5 - 5.1 mmol/L 3.6 3.9 4.2  Chloride 101 - 111 mmol/L 97(L) 98(L) 97(L)  CO2 22 - 32 mmol/L 37(H) 35(H) 32  Calcium 8.9 - 10.3 mg/dL 9.0 8.9 8.7(L)  Total Protein 6.5 - 8.1 g/dL - - -  Total Bilirubin 0.3 - 1.2 mg/dL - - -  Alkaline Phos 38 - 126 U/L - - -  AST 15 - 41 U/L - - -  ALT 14 - 54 U/L - - -     CBC Latest Ref Rng & Units 06/20/2016 06/19/2016 06/18/2016  WBC 4.0 - 10.5 K/uL 14.2(H) 10.6(H) 12.4(H)  Hemoglobin 12.0 - 15.0 g/dL 11.0(L) 11.1(L) 10.5(L)  Hematocrit 36.0 - 46.0 % 38.4 38.6 36.3  Platelets 150 - 400 K/uL 177 139(L) 139(L)     ABG    Component Value Date/Time   PHART 7.364 06/18/2016 0410   PCO2ART 57.9 (H) 06/18/2016 0410   PO2ART 70.4 (L) 06/18/2016 0410   HCO3 32.0 (H) 06/18/2016 0410   TCO2 >50 06/15/2016 1303   O2SAT 92.6 06/18/2016 0410     CBG (last 3)   Recent Labs  06/20/16 0358 06/20/16 0740  06/20/16 1105  GLUCAP 98 91 142*     Imaging: Dg Chest Port 1 View  Result Date: 06/20/2016 CLINICAL DATA:  Respiratory failure. EXAM: PORTABLE CHEST 1 VIEW COMPARISON:  06/17/2016. FINDINGS: Interim extubation. NG tube noted with its tip below left hemidiaphragm. Prior CABG. Stable cardiomegaly. Left upper lobe mass lesion again noted. Bibasilar pulmonary infiltrates and or edema and small bilateral pleural effusions again noted. Slight worsening from prior exam. No pneumothorax. IMPRESSION: 1.  Interim extubation.  NG tube in stable position. 2.  Left upper lobe mass lesion unchanged. 3. Prior CABG. Stable cardiomegaly. Progression of bibasilar infiltrates/edema and bilateral pleural effusions. Findings suggest a component of congestive heart failure. Electronically Signed   By: Marcello Moores  Register   On: 06/20/2016 06:41   Dg Abd Portable 1v  Result Date: 06/19/2016 CLINICAL DATA:  NG tube placement EXAM: PORTABLE ABDOMEN - 1 VIEW COMPARISON:  06/18/2016 FINDINGS: Post sternotomy changes with linear atelectasis at the left lung base. Esophageal tube tip and side port overlie the proximal stomach. Persistent gaseous dilatation of bowel in the upper abdomen. IMPRESSION: Esophageal tube tip and side-port overlies the proximal stomach.  Continued gaseous dilatation of the bowel. Electronically Signed   By: Donavan Foil [name]   On: 06/19/2016 23:37   Dg Abd Portable 1v  Result Date: 06/18/2016 CLINICAL DATA:  Abdominal distention.  NG tube placement. EXAM: PORTABLE ABDOMEN - 1 VIEW COMPARISON:  12/03/2015 FINDINGS: An NG tube is identified with tip overlying the distal stomach. Distended gas-filled bowel loops are noted, mouth wrist with represent colon. IMPRESSION: NG tube with tip overlying the distal stomach. Electronically Signed   By: Margarette Canada [name]   On: 06/18/2016 19:55     Studies: CT chest 5/09 >> CAD, borderline LAN, small b/l effusions, bronchomalacia, mod/severe centrilobular emphysema,  4.7 cm mass along mediastinal pleura and Lt hilum Echo 5/11 >> EF 60 to 65%, grade 1 DD  Antibiotics: Doxycycline 5/09 >>5/15 Ancef 5/15>>>   Cultures: RSV 5/09 >> negative Sputum 5/09 >> MSSA Blood 5/09 >>  Lines/tubes: ETT 5/09 >> 5/14  Events: 5/09 Admit 5/11 Failed precedex 5/12 Add seroquel, bowel regimen  Assessment/plan:  Acute on chronic hypoxic/hypercapnic respiratory failure 2nd to AECOPD. - IS ensure as pcxr atx worse? 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 - continue seroquel - restart neurontin -mobilize  DM type II. controlled - SSI with lantus  Chronic diastolic CHF.  Hx of CAD, CVA, HLD. - lipitor  DVT prophylaxis - SQ heparin until mobilize SUP - protonix, dc Nutrition - tube feeds Goals of care - full code  Lavon Paganini. Titus Mould, MD, [place] Pgr: [facility]

## 2016-06-20 NOTE — Care Management Note (Signed)
Case Management Note  Patient Details  Name: [name] MRN: [id number] Date of Birth: Jun 10, 1955  Subjective/Objective:      Admitted with COPD excerbation               Action/Plan:  PTA from home with husband    Expected Discharge Date:                  Expected Discharge Plan:  Home/Self Care (from home with husband)  In-House Referral:     Discharge planning Services  CM Consult  Post Acute Care Choice:    Choice offered to:     DME Arranged:    DME Agency:     HH Arranged:    [place] Agency:     Status of Service:     If discussed at [employer], dates discussed:    Additional Comments: Pt is now extubated on HF Bier Maryclare Labrador, RN 06/20/2016, 11:46 AM

## 2016-06-21 ENCOUNTER — Inpatient Hospital Stay (HOSPITAL_COMMUNITY): Payer: BLUE CROSS/BLUE SHIELD

## 2016-06-21 LAB — GLUCOSE, CAPILLARY
GLUCOSE-CAPILLARY: 67 mg/dL (ref 65–99)
GLUCOSE-CAPILLARY: 127 mg/dL — AB (ref 65–99)
GLUCOSE-CAPILLARY: 65 mg/dL (ref 65–99)
Glucose-Capillary: 100 mg/dL — ABNORMAL HIGH (ref 65–99)
Glucose-Capillary: 113 mg/dL — ABNORMAL HIGH (ref 65–99)
Glucose-Capillary: 143 mg/dL — ABNORMAL HIGH (ref 65–99)
Glucose-Capillary: 82 mg/dL (ref 65–99)
Glucose-Capillary: 87 mg/dL (ref 65–99)

## 2016-06-21 MED ORDER — DEXTROSE 50 % IV SOLN
INTRAVENOUS | Status: AC
  Administered 2016-06-21: 25 mL
  Filled 2016-06-21: qty 50

## 2016-06-21 MED ORDER — CALCIUM CARBONATE ANTACID 500 MG PO CHEW
1.0000 | CHEWABLE_TABLET | Freq: Two times a day (BID) | ORAL | Status: DC
  Administered 2016-06-21 – 2016-06-23 (×5): 200 mg via ORAL
  Filled 2016-06-21 (×5): qty 1

## 2016-06-21 MED ORDER — RIVAROXABAN 20 MG PO TABS
20.0000 mg | ORAL_TABLET | Freq: Every day | ORAL | Status: DC
  Administered 2016-06-21 – 2016-06-23 (×3): 20 mg via ORAL
  Filled 2016-06-21 (×3): qty 1

## 2016-06-21 NOTE — Evaluation (Signed)
Clinical/Bedside Swallow Evaluation Patient Details  Name: [name] MRN: [id number] Date of Birth: Jun 27, 1955  Today's Date: 06/21/2016 Time: SLP Start Time (ACUTE ONLY): 1540 SLP Stop Time (ACUTE ONLY): 1519 SLP Time Calculation (min) (ACUTE ONLY): 20 min  Past Medical History:  Past Medical History:  Diagnosis Date  . Acute MI ([place])   . Arthritis   . COPD (chronic obstructive pulmonary disease) ([place])    home O2  . Coronary artery disease   . Diabetes mellitus without complication ([place])   . Shortness of breath   . Stroke [facility]) TIA    Past Surgical History:  Past Surgical History:  Procedure Laterality Date  . ABDOMINAL HYSTERECTOMY    . CARDIAC CATHETERIZATION    . CORONARY ARTERY BYPASS GRAFT     [id number]   HPI:  61 year old female with PMH of COPD (4-5L [place] at baseline), MI, stroke, DM, CAD and medical noncompliance. On 5/9 presented to ED with 2 day reported dyspnea and increased confusion. Found to have acute on chronic hypoxic/hypercapnic respiratory failure due to AECOPD. Per chart imagining revealed worsening mediastinal mass, husband states that wife knew of this however, kept putting off following up. Intubated 5/9-5/14. CXR Low lung volumes with basilar atelectasis. Mild bibasilar   Assessment / Plan / Recommendation Clinical Impression  Pt is at higher risk for pharyngeal dysphagia given 6 day intubation, history of COPD and deconditioning. Immediate and delayed coughing and with mild emesis following majority of thin and solid trials. Pt and husband reported "coughing and vomiting most of day" although no cough noted prior to po's and none in 3-4 minutes after food/liquids stopped.    SLP Visit Diagnosis: Dysphagia, unspecified (R13.10)    Aspiration Risk   (mild-mod)    Diet Recommendation NPO;Ice chips PRN after oral care   Medication Administration: Via alternative means    Other  Recommendations Oral Care Recommendations: Oral care QID   Follow up  Recommendations  (TBD)      Frequency and Duration            Prognosis        Swallow Study   General HPI: 61 year old female with PMH of COPD (4-5L [place] at baseline), MI, stroke, DM, CAD and medical noncompliance. On 5/9 presented to ED with 2 day reported dyspnea and increased confusion. Found to have acute on chronic hypoxic/hypercapnic respiratory failure due to AECOPD. Per chart imagining revealed worsening mediastinal mass, husband states that wife knew of this however, kept putting off following up. Intubated 5/9-5/14. CXR Low lung volumes with basilar atelectasis. Mild bibasilar Type of Study: Bedside Swallow Evaluation Previous Swallow Assessment: see HPI Diet Prior to this Study: NPO Temperature Spikes Noted: No Respiratory Status: Nasal cannula History of Recent Intubation: Yes Length of Intubations (days): 6 days Date extubated: 06/19/16 Behavior/Cognition: Alert;Cooperative Oral Cavity Assessment: Within Functional Limits Oral Care Completed by SLP: No Oral Cavity - Dentition: Poor condition;Missing dentition (2-3 teeth total) Vision: Functional for self-feeding Self-Feeding Abilities: Able to feed self Patient Positioning: Upright in bed Baseline Vocal Quality: Hoarse Volitional Cough: Strong Volitional Swallow: Able to elicit    Oral/Motor/Sensory Function Overall Oral Motor/Sensory Function: Within functional limits   Ice Chips Ice chips: Not tested   Thin Liquid Thin Liquid: Impaired Presentation: Cup Oral Phase Impairments:  (none) Oral Phase Functional Implications:  (none) Pharyngeal  Phase Impairments: Cough - Delayed    Nectar Thick Nectar Thick Liquid: Not tested   Honey Thick Honey Thick Liquid: Not  tested   Puree Puree:  (politely refused)   Solid   GO   Solid: Impaired Presentation: Self Fed Oral Phase Impairments:  (none) Oral Phase Functional Implications:  (none) Pharyngeal Phase Impairments: Cough - Delayed;Cough - Immediate         [name] 06/21/2016,3:31 PM   [name] [name].Ed [employer] [phone]

## 2016-06-21 NOTE — Progress Notes (Signed)
PROGRESS NOTE    [name]  OHY:[id number] DOB: 01-18-1956 DOA: 06/14/2016 PCP: Patient, No Pcp Per   Brief Narrative: [name] is a 61 y.o. a history of COPD with chronic respiratory failure. She presented with AMS secondary to acute on chronic respiratory failure, requiring intubation and sedation secondary to COPD exacerbation. She was started on tube feeds and eventually extubated with subsequent transfer to [facility] services on 5/16.    Assessment & Plan:   Active Problems:   Acute respiratory failure with hypercapnia (HCC)   Ventilator dependent (HCC)   Acute on chronic respiratory failure with hypoxia and hypercapnia Patient with baseline advanced COPD on 5L oxygen as an outpatient. Patient required intubation on 5/9 and was extubated on 5/14. She developed MSSA pneumonia. Started on NG tube feeds secondary to endotracheal intubation. -SLP consult -continue prednisone taper -continue Ancef and transition to amoxicillin once taking by mouth  Sepsis Secondary to pneumonia. Initial lactic acidosis of 1.01, up to 2.33 which improved to 1.8 with IV fluid resuscitation. Physiology resolved.  Diabetes mellitus, type 2 -continue SSI  Chronic cor pulmonale Diastolic CHF Euvolemic  CAD No chest pain  Coagulopathy Secondary to Xarelto.   Anemia Unknown etiology. Normochromic. -iron panel  Metabolic encephalopathy Secondary to hypoxia.  -monitor today. If continues, will obtain repeat ABG  Lung mass This is not new and per chart review, patient has put off evaluation  History of TIA Patient is on Xarelto as a chronic medication -restart Xarelto and discontinue heparin subq   DVT prophylaxis: Xarelto Code Status: Full code Family Communication: Husband at bedside Disposition Plan: Discharge pending clinical improvement   Consultants:   None  Procedures:   ETT (5/9-5/14)  Antimicrobials:  Doxycycline (5/9>>5/15)  Ancef (5/15>>     Subjective: Patient reports wanting her NG tube out. She has had some nausea with spitting up some mucous. Otherwise, no abdominal pain.  Objective: Vitals:   06/21/16 0500 06/21/16 0534 06/21/16 0727 06/21/16 0900  BP:  119/62  (!) 122/58  Pulse:  88  91  Resp:  20  20  Temp:  98 F (36.7 C)  98.4 F (36.9 C)  TempSrc:  Oral  Oral  SpO2:  94% 94% 96%  Weight: 96.3 kg (212 lb 3.2 oz)     Height:        Intake/Output Summary (Last 24 hours) at 06/21/16 1447 Last data filed at 06/21/16 0411  Gross per 24 hour  Intake               50 ml  Output                0 ml  Net               50 ml   Filed Weights   06/19/16 0141 06/20/16 0410 06/21/16 0500  Weight: 95.1 kg (209 lb 10.5 oz) 92.9 kg (204 lb 12.9 oz) 96.3 kg (212 lb 3.2 oz)    Examination:  General exam: Appears calm and comfortable Respiratory system: Clear to auscultation but very diminished bilaterally. Respiratory effort normal. Cardiovascular system: S1 & S2 heard, RRR. No murmurs, rubs, gallops or clicks. Gastrointestinal system: Abdomen is nondistended, soft and nontender. Normal bowel sounds heard. Central nervous system: Alert and oriented to person only. No focal neurological deficits. Extremities: No edema. No calf tenderness Skin: No cyanosis. No rashes Psychiatry: Judgement and insight appear normal. Mood & affect appropriate.     Data Reviewed: I have personally reviewed following  labs and imaging studies  CBC:  Recent Labs Lab 06/16/16 0304 06/17/16 0219 06/18/16 0303 06/19/16 0318 06/20/16 0346  WBC 16.3* 13.7* 12.4* 10.6* 14.2*  NEUTROABS 15.1* 12.0* 9.9* 8.2*  --   HGB 10.2* 10.3* 10.5* 11.1* 11.0*  HCT 37.7 36.4 36.3 38.6 38.4  MCV 98.7 94.8 94.5 94.8 95.0  PLT 170 171 139* 139* 037   Basic Metabolic Panel:  Recent Labs Lab 06/15/16 0516 06/15/16 1649 06/16/16 0304 06/17/16 0219 06/18/16 0303 06/19/16 0318 06/20/16 0346  NA 138  --  133* 135 137 141 139  K 4.7  --   4.6 4.5 4.2 3.9 3.6  CL 87*  --  86* 95* 97* 98* 97*  CO2 44*  --  43* 33* 32 35* 37*  GLUCOSE 260*  --  279* 239* 125* 145* 94  BUN 29*  --  26* 24* 25* 22* 15  CREATININE 0.98  --  0.67 0.68 0.60 0.57 0.50  CALCIUM 8.8*  --  8.4* 8.9 8.7* 8.9 9.0  MG 2.2 2.2  --   --   --  2.0 1.7  PHOS <1.0* 4.0  --   --   --  3.0 3.1   GFR: Estimated Creatinine Clearance: 85.3 mL/min (by C-G formula based on SCr of 0.5 mg/dL). Liver Function Tests:  Recent Labs Lab 06/19/16 0318  ALBUMIN 2.4*   No results for input(s): LIPASE, AMYLASE in the last 168 hours. No results for input(s): AMMONIA in the last 168 hours. Coagulation Profile: No results for input(s): INR, PROTIME in the last 168 hours. Cardiac Enzymes:  Recent Labs Lab 06/14/16 1923 06/15/16 0039  TROPONINI <0.03 <0.03   BNP (last 3 results) No results for input(s): PROBNP in the last 8760 hours. HbA1C: No results for input(s): HGBA1C in the last 72 hours. CBG:  Recent Labs Lab 06/20/16 2340 06/21/16 0323 06/21/16 0406 06/21/16 0803 06/21/16 1153  GLUCAP 77 67 127* 82 143*   Lipid Profile: No results for input(s): CHOL, HDL, LDLCALC, TRIG, CHOLHDL, LDLDIRECT in the last 72 hours. Thyroid Function Tests: No results for input(s): TSH, T4TOTAL, FREET4, T3FREE, THYROIDAB in the last 72 hours. Anemia Panel: No results for input(s): VITAMINB12, FOLATE, FERRITIN, TIBC, IRON, RETICCTPCT in the last 72 hours. Sepsis Labs:  Recent Labs Lab 06/15/16 0516 06/16/16 0304  PROCALCITON <0.10 <0.10    Recent Results (from the past 240 hour(s))  Blood Culture (routine x 2)     Status: None   Collection Time: 06/14/16  8:55 AM  Result Value Ref Range Status   Specimen Description BLOOD LEFT ANTECUBITAL  Final   Special Requests   Final    BOTTLES DRAWN AEROBIC AND ANAEROBIC Blood Culture adequate volume   Culture NO GROWTH 5 DAYS  Final   Report Status 06/19/2016 FINAL  Final  Blood Culture (routine x 2)     Status: None    Collection Time: 06/14/16  8:58 AM  Result Value Ref Range Status   Specimen Description BLOOD RIGHT HAND  Final   Special Requests   Final    BOTTLES DRAWN AEROBIC ONLY Blood Culture adequate volume   Culture NO GROWTH 5 DAYS  Final   Report Status 06/19/2016 FINAL  Final  Respiratory Panel by PCR     Status: None   Collection Time: 06/14/16  1:03 PM  Result Value Ref Range Status   Adenovirus NOT DETECTED NOT DETECTED Final   Coronavirus 229E NOT DETECTED NOT DETECTED Final   Coronavirus HKU1 NOT  DETECTED NOT DETECTED Final   Coronavirus NL63 NOT DETECTED NOT DETECTED Final   Coronavirus OC43 NOT DETECTED NOT DETECTED Final   Metapneumovirus NOT DETECTED NOT DETECTED Final   Rhinovirus / Enterovirus NOT DETECTED NOT DETECTED Final   Influenza A NOT DETECTED NOT DETECTED Final   Influenza B NOT DETECTED NOT DETECTED Final   Parainfluenza Virus 1 NOT DETECTED NOT DETECTED Final   Parainfluenza Virus 2 NOT DETECTED NOT DETECTED Final   Parainfluenza Virus 3 NOT DETECTED NOT DETECTED Final   Parainfluenza Virus 4 NOT DETECTED NOT DETECTED Final   Respiratory Syncytial Virus NOT DETECTED NOT DETECTED Final   Bordetella pertussis NOT DETECTED NOT DETECTED Final   Chlamydophila pneumoniae NOT DETECTED NOT DETECTED Final   Mycoplasma pneumoniae NOT DETECTED NOT DETECTED Final  MRSA PCR Screening     Status: None   Collection Time: 06/14/16  2:59 PM  Result Value Ref Range Status   MRSA by PCR NEGATIVE NEGATIVE Final    Comment:        The GeneXpert MRSA Assay (FDA approved for NASAL specimens only), is one component of a comprehensive MRSA colonization surveillance program. It is not intended to diagnose MRSA infection nor to guide or monitor treatment for MRSA infections.   Culture, respiratory (NON-Expectorated)     Status: None   Collection Time: 06/14/16  4:30 PM  Result Value Ref Range Status   Specimen Description TRACHEAL ASPIRATE  Final   Special Requests NONE   Final   Gram Stain   Final    MODERATE WBC PRESENT,BOTH PMN AND MONONUCLEAR ABUNDANT GRAM POSITIVE COCCI IN PAIRS IN CLUSTERS MODERATE GRAM VARIABLE ROD FEW GRAM POSITIVE COCCI IN CHAINS    Culture MODERATE STAPHYLOCOCCUS AUREUS  Final   Report Status 06/17/2016 FINAL  Final   Organism ID, Bacteria STAPHYLOCOCCUS AUREUS  Final      Susceptibility   Staphylococcus aureus - MIC*    CIPROFLOXACIN <=0.5 SENSITIVE Sensitive     ERYTHROMYCIN <=0.25 SENSITIVE Sensitive     GENTAMICIN <=0.5 SENSITIVE Sensitive     OXACILLIN <=0.25 SENSITIVE Sensitive     TETRACYCLINE <=1 SENSITIVE Sensitive     VANCOMYCIN <=0.5 SENSITIVE Sensitive     TRIMETH/SULFA <=10 SENSITIVE Sensitive     CLINDAMYCIN <=0.25 SENSITIVE Sensitive     RIFAMPIN <=0.5 SENSITIVE Sensitive     Inducible Clindamycin NEGATIVE Sensitive     * MODERATE STAPHYLOCOCCUS AUREUS         Radiology Studies: Dg Chest Port 1 View  Result Date: 06/21/2016 CLINICAL DATA:  Cough and congestion. EXAM: PORTABLE CHEST 1 VIEW COMPARISON:  06/20/2016 .  CT 06/14/2016. FINDINGS: NG tube with tip below left hemidiaphragm. Side-hole just below the gastroesophageal junction. Prior CABG. Stable cardiomegaly. Persistent left upper lung mass. Persistent bibasilar atelectasis. Bibasilar infiltrates cannot be excluded. Tiny bilateral pleural effusions again noted. No pneumothorax. IMPRESSION: 1. NG tube noted with tip below left hemidiaphragm. Side hole is just below the GE junction. 2.  Prior CABG.  Stable cardiomegaly. 3.  Left upper lung mass again noted without interim change. 4. Low lung volumes with basilar atelectasis. Mild bibasilar infiltrates cannot be excluded. Small bilateral pleural effusions again noted . Chest is unchanged from prior exam. Electronically Signed   By: Marcello Moores  Register   On: 06/21/2016 07:38   Dg Chest Port 1 View  Result Date: 06/20/2016 CLINICAL DATA:  Respiratory failure. EXAM: PORTABLE CHEST 1 VIEW COMPARISON:   06/17/2016. FINDINGS: Interim extubation. NG tube noted with its tip  below left hemidiaphragm. Prior CABG. Stable cardiomegaly. Left upper lobe mass lesion again noted. Bibasilar pulmonary infiltrates and or edema and small bilateral pleural effusions again noted. Slight worsening from prior exam. No pneumothorax. IMPRESSION: 1.  Interim extubation.  NG tube in stable position. 2.  Left upper lobe mass lesion unchanged. 3. Prior CABG. Stable cardiomegaly. Progression of bibasilar infiltrates/edema and bilateral pleural effusions. Findings suggest a component of congestive heart failure. Electronically Signed   By: Marcello Moores  Register   On: 06/20/2016 06:41   Dg Abd Portable 1v  Result Date: 06/19/2016 CLINICAL DATA:  NG tube placement EXAM: PORTABLE ABDOMEN - 1 VIEW COMPARISON:  06/18/2016 FINDINGS: Post sternotomy changes with linear atelectasis at the left lung base. Esophageal tube tip and side port overlie the proximal stomach. Persistent gaseous dilatation of bowel in the upper abdomen. IMPRESSION: Esophageal tube tip and side-port overlies the proximal stomach. Continued gaseous dilatation of the bowel. Electronically Signed   By: Donavan Foil [name]   On: 06/19/2016 23:37        Scheduled Meds: . atorvastatin  20 mg Per Tube q1800  . budesonide (PULMICORT) nebulizer solution  0.25 mg Nebulization BID  . calcium carbonate  1 tablet Oral BID WC  . chlorhexidine  15 mL Mouth Rinse BID  . docusate  100 mg Per Tube BID  . gabapentin  100 mg Per Tube TID  . heparin subcutaneous  5,000 Units Subcutaneous Q8H  . insulin aspart  0-15 Units Subcutaneous Q4H  . insulin glargine  30 Units Subcutaneous Daily  . ipratropium-albuterol  3 mL Nebulization TID  . mouth rinse  15 mL Mouth Rinse BID  . mouth rinse  15 mL Mouth Rinse q12n4p  . metoprolol tartrate  25 mg Per Tube BID  . polyethylene glycol  17 g Oral Daily  . predniSONE  20 mg Per Tube Q breakfast  . QUEtiapine  25 mg Per Tube QHS  .  sennosides  5 mL Per Tube BID   Continuous Infusions: . sodium chloride 250 mL (06/20/16 0700)  .  ceFAZolin (ANCEF) IV Stopped (06/21/16 1432)     LOS: 7 days     Cordelia Poche, MD [facility] 06/21/2016, 2:47 PM Pager: [phone]  If 7PM-7AM, please contact night-coverage [email or web address] Password [facility] 06/21/2016, 2:47 PM

## 2016-06-21 NOTE — Progress Notes (Signed)
Hypoglycemic Event  CBG: 67  Treatment: D50 IV 25 mL  Symptoms: None  Follow-up CBG: Time: 0406 CBG Result: 127  Possible Reasons for Event: Inadequate meal intake  Comments/MD notified: Patient talking & states feels ok    Lennox Laity  RN

## 2016-06-21 NOTE — Progress Notes (Signed)
Nutrition Follow Up  DOCUMENTATION CODES:   Obesity unspecified  INTERVENTION:    PO diet advancement vs TF re-initiation.  RD to add or start interventions accordingly.  NUTRITION DIAGNOSIS:   Inadequate oral intake related to inability to eat as evidenced by NPO status, ongoing  GOAL:   Patient will meet greater than or equal to 90% of their needs, currently unmet  MONITOR:   Diet advancement, PO intake, Labs, Weight trends, I & O's  ASSESSMENT:   61 year old female with advanced COPD presents to ED hypoxic and hypercarbic requiring intubation.  Pt extubated 5/14.  TF (Vital HP) discontinued via OGT. Transferred from [facility]Medical ICU to [facility] 5/15. Pt remains NPO.  Speech Path consulted for swallow evaluation. Medications and labs reviewed. CBG's [phone].  Diet Order:  Diet NPO time specified  Skin:  Reviewed, no issues  Last BM:  5/15  Height:   Ht Readings from Last 1 Encounters:  06/14/16 '5\' 6"'$  (1.676 m)   Weight:   Wt Readings from Last 1 Encounters:  06/21/16 212 lb 3.2 oz (96.3 kg)    Ideal Body Weight:  59.1 kg  BMI:  Body mass index is 34.25 kg/m.  Estimated Nutritional Needs:   Kcal:  1800-2000  Protein:  100-110 gm  Fluid:  1.8-2.0 L  EDUCATION NEEDS:   No education needs identified at this time  Arthur Holms, RD, LDN Pager #: [phone] After-Hours Pager #: [phone]

## 2016-06-21 NOTE — Progress Notes (Signed)
Patient pulled out of her NG.    RN removed the NG tube the remaining way.  Patient tolerated well.  RN will continue to monitor the patient

## 2016-06-21 NOTE — Progress Notes (Signed)
Patient is on 6L [place] and tolerating well with no respiratory distress noted. BIPAP is not needed at this time. RT will monitor as needed.

## 2016-06-22 LAB — IRON AND TIBC
Iron: 38 ug/dL (ref 28–170)
Saturation Ratios: 17 % (ref 10.4–31.8)
TIBC: 225 ug/dL — ABNORMAL LOW (ref 250–450)
UIBC: 187 ug/dL

## 2016-06-22 LAB — GLUCOSE, CAPILLARY
GLUCOSE-CAPILLARY: 87 mg/dL (ref 65–99)
GLUCOSE-CAPILLARY: 225 mg/dL — AB (ref 65–99)
GLUCOSE-CAPILLARY: 151 mg/dL — AB (ref 65–99)
Glucose-Capillary: 105 mg/dL — ABNORMAL HIGH (ref 65–99)
Glucose-Capillary: 60 mg/dL — ABNORMAL LOW (ref 65–99)
Glucose-Capillary: 144 mg/dL — ABNORMAL HIGH (ref 65–99)
Glucose-Capillary: 167 mg/dL — ABNORMAL HIGH (ref 65–99)

## 2016-06-22 LAB — FOLATE: FOLATE: 14.4 ng/mL (ref >5.9)

## 2016-06-22 LAB — RETICULOCYTES
RBC.: 4.31 MIL/uL (ref 3.87–5.11)
RETIC COUNT ABSOLUTE: 90.5 10*3/uL (ref 19.0–186.0)
RETIC CT PCT: 2.1 % (ref 0.4–3.1)

## 2016-06-22 LAB — CBC
HCT: 40.9 % (ref 36.0–46.0)
HEMOGLOBIN: 11.5 g/dL — AB (ref 12.0–15.0)
MCH: 26.7 pg (ref 26.0–34.0)
MCHC: 28.1 g/dL — AB (ref 30.0–36.0)
MCV: 94.9 fL (ref 78.0–100.0)
PLATELETS: 223 10*3/uL (ref 150–400)
RBC: 4.31 MIL/uL (ref 3.87–5.11)
RDW: 14.7 % (ref 11.5–15.5)
WBC: 10.1 10*3/uL (ref 4.0–10.5)

## 2016-06-22 LAB — FERRITIN: Ferritin: 267 ng/mL (ref 11–307)

## 2016-06-22 LAB — VITAMIN B12: VITAMIN B 12: 784 pg/mL (ref 180–914)

## 2016-06-22 MED ORDER — PROMETHAZINE HCL 25 MG/ML IJ SOLN: 6.2500 mg | Freq: Four times a day (QID) | INTRAMUSCULAR | Status: DC | PRN

## 2016-06-22 MED ORDER — HYDROCODONE-ACETAMINOPHEN 5-325 MG PO TABS
1.0000 | ORAL_TABLET | Freq: Four times a day (QID) | ORAL | Status: DC | PRN
  Administered 2016-06-22: 1 via ORAL
  Administered 2016-06-22 – 2016-06-23 (×3): 2 via ORAL
  Filled 2016-06-22: qty 1
  Filled 2016-06-22 (×3): qty 2

## 2016-06-22 MED ORDER — INSULIN GLARGINE 100 UNIT/ML ~~LOC~~ SOLN
20.0000 [IU] | Freq: Every day | SUBCUTANEOUS | Status: DC
  Administered 2016-06-22: 20 [IU] via SUBCUTANEOUS
  Filled 2016-06-22 (×2): qty 0.2

## 2016-06-22 MED ORDER — DEXTROSE 5 % IV SOLN
INTRAVENOUS | Status: DC
  Administered 2016-06-22: 05:00:00 via INTRAVENOUS

## 2016-06-22 MED ORDER — DEXTROSE 50 % IV SOLN
INTRAVENOUS | Status: AC
  Administered 2016-06-22: 25 mL
  Filled 2016-06-22: qty 50

## 2016-06-22 NOTE — Progress Notes (Signed)
PROGRESS NOTE    [name]  QBH:[id number] DOB: Jan 28, 1956 DOA: 06/14/2016 PCP: Patient, No Pcp Per   Brief Narrative: [name] is a 61 y.o. a history of COPD with chronic respiratory failure. She presented with AMS secondary to acute on chronic respiratory failure, requiring intubation and sedation secondary to COPD exacerbation. She was started on tube feeds and eventually extubated with subsequent transfer to [facility] services on 5/16.    Assessment & Plan:   Active Problems:   Acute respiratory failure with hypercapnia (HCC)   Ventilator dependent (HCC)   Acute on chronic respiratory failure with hypoxia and hypercapnia Patient with baseline advanced COPD on 5L oxygen as an outpatient. Patient required intubation on 5/9 and was extubated on 5/14. She developed MSSA pneumonia sensitive to tetracyclines. Started on NG tube feeds secondary to endotracheal intubation which has now been discontinued. -SLP recommendations: dysphagia 3 diet -continue prednisone taper -discontinue Ancef  Sepsis Secondary to pneumonia. Initial lactic acidosis of 1.01, up to 2.33 which improved to 1.8 with IV fluid resuscitation. Physiology resolved.  Diabetes mellitus, type 2 -continue SSI  Chronic cor pulmonale Diastolic CHF Euvolemic  CAD No chest pain  Coagulopathy Secondary to Xarelto.   Anemia Unknown etiology. Normochromic. -iron panel  Metabolic encephalopathy Secondary to hypoxia.  -monitor today. If continues, will obtain repeat ABG  Lung mass This is not new and per chart review, patient has put off evaluation  History of TIA Patient is on Xarelto as a chronic medication -restart Xarelto and discontinue heparin subq   DVT prophylaxis: Xarelto Code Status: Full code Family Communication: Husband at bedside Disposition Plan: Discharge pending clinical improvement   Consultants:   None  Procedures:   ETT (5/9-5/14)  Antimicrobials:  Doxycycline  (5/9>>5/15)  Ancef (5/15>>5/17)   Subjective: Patient pulled NG tube last night. No chest pain or dyspnea. She is having some mild productive cough. No emesis.  Objective: Vitals:   06/22/16 0513 06/22/16 0835 06/22/16 0836 06/22/16 1016  BP: 128/65   (!) 113/55  Pulse: 89   83  Resp: 20   18  Temp: 97.9 F (36.6 C)   99.3 F (37.4 C)  TempSrc: Oral   Oral  SpO2: 98% 90% 90% 91%  Weight:      Height:        Intake/Output Summary (Last 24 hours) at 06/22/16 1310 Last data filed at 06/22/16 1014  Gross per 24 hour  Intake           421.67 ml  Output                0 ml  Net           421.67 ml   Filed Weights   06/20/16 0410 06/21/16 0500 06/22/16 0449  Weight: 92.9 kg (204 lb 12.9 oz) 96.3 kg (212 lb 3.2 oz) 94.8 kg (209 lb)    Examination:  General exam: Appears calm and comfortable Respiratory system: Clear to auscultation but very diminished bilaterally. Respiratory effort normal. Cardiovascular system: S1 & S2 heard, RRR. No murmurs, rubs, gallops or clicks. Gastrointestinal system: Abdomen is nondistended, soft and nontender. Normal bowel sounds heard. Central nervous system: Alert and oriented. No focal neurological deficits. Extremities: No edema. No calf tenderness Skin: No cyanosis. No rashes Psychiatry: Judgement and insight appear normal. Mood & affect appropriate.     Data Reviewed: I have personally reviewed following labs and imaging studies  CBC:  Recent Labs Lab 06/16/16 0304 06/17/16 0219 06/18/16 0303 06/19/16  0973 06/20/16 0346 06/22/16 0539  WBC 16.3* 13.7* 12.4* 10.6* 14.2* 10.1  NEUTROABS 15.1* 12.0* 9.9* 8.2*  --   --   HGB 10.2* 10.3* 10.5* 11.1* 11.0* 11.5*  HCT 37.7 36.4 36.3 38.6 38.4 40.9  MCV 98.7 94.8 94.5 94.8 95.0 94.9  PLT 170 171 139* 139* 177 532   Basic Metabolic Panel:  Recent Labs Lab 06/15/16 1649 06/16/16 0304 06/17/16 0219 06/18/16 0303 06/19/16 0318 06/20/16 0346  NA  --  133* 135 137 141 139  K  --   4.6 4.5 4.2 3.9 3.6  CL  --  86* 95* 97* 98* 97*  CO2  --  43* 33* 32 35* 37*  GLUCOSE  --  279* 239* 125* 145* 94  BUN  --  26* 24* 25* 22* 15  CREATININE  --  0.67 0.68 0.60 0.57 0.50  CALCIUM  --  8.4* 8.9 8.7* 8.9 9.0  MG 2.2  --   --   --  2.0 1.7  PHOS 4.0  --   --   --  3.0 3.1   GFR: Estimated Creatinine Clearance: 84.6 mL/min (by C-G formula based on SCr of 0.5 mg/dL). Liver Function Tests:  Recent Labs Lab 06/19/16 0318  ALBUMIN 2.4*   No results for input(s): LIPASE, AMYLASE in the last 168 hours. No results for input(s): AMMONIA in the last 168 hours. Coagulation Profile: No results for input(s): INR, PROTIME in the last 168 hours. Cardiac Enzymes: No results for input(s): CKTOTAL, CKMB, CKMBINDEX, TROPONINI in the last 168 hours. BNP (last 3 results) No results for input(s): PROBNP in the last 8760 hours. HbA1C: No results for input(s): HGBA1C in the last 72 hours. CBG:  Recent Labs Lab 06/22/16 0003 06/22/16 0344 06/22/16 0428 06/22/16 0751 06/22/16 1131  GLUCAP 113* 60* 105* 87 144*   Lipid Profile: No results for input(s): CHOL, HDL, LDLCALC, TRIG, CHOLHDL, LDLDIRECT in the last 72 hours. Thyroid Function Tests: No results for input(s): TSH, T4TOTAL, FREET4, T3FREE, THYROIDAB in the last 72 hours. Anemia Panel:  Recent Labs  06/22/16 0539  VITAMINB12 784  FOLATE 14.4  FERRITIN 267  TIBC 225*  IRON 38  RETICCTPCT 2.1   Sepsis Labs:  Recent Labs Lab 06/16/16 0304  PROCALCITON <0.10    Recent Results (from the past 240 hour(s))  Blood Culture (routine x 2)     Status: None   Collection Time: 06/14/16  8:55 AM  Result Value Ref Range Status   Specimen Description BLOOD LEFT ANTECUBITAL  Final   Special Requests   Final    BOTTLES DRAWN AEROBIC AND ANAEROBIC Blood Culture adequate volume   Culture NO GROWTH 5 DAYS  Final   Report Status 06/19/2016 FINAL  Final  Blood Culture (routine x 2)     Status: None   Collection Time:  06/14/16  8:58 AM  Result Value Ref Range Status   Specimen Description BLOOD RIGHT HAND  Final   Special Requests   Final    BOTTLES DRAWN AEROBIC ONLY Blood Culture adequate volume   Culture NO GROWTH 5 DAYS  Final   Report Status 06/19/2016 FINAL  Final  Respiratory Panel by PCR     Status: None   Collection Time: 06/14/16  1:03 PM  Result Value Ref Range Status   Adenovirus NOT DETECTED NOT DETECTED Final   Coronavirus 229E NOT DETECTED NOT DETECTED Final   Coronavirus HKU1 NOT DETECTED NOT DETECTED Final   Coronavirus NL63 NOT DETECTED NOT DETECTED  Final   Coronavirus OC43 NOT DETECTED NOT DETECTED Final   Metapneumovirus NOT DETECTED NOT DETECTED Final   Rhinovirus / Enterovirus NOT DETECTED NOT DETECTED Final   Influenza A NOT DETECTED NOT DETECTED Final   Influenza B NOT DETECTED NOT DETECTED Final   Parainfluenza Virus 1 NOT DETECTED NOT DETECTED Final   Parainfluenza Virus 2 NOT DETECTED NOT DETECTED Final   Parainfluenza Virus 3 NOT DETECTED NOT DETECTED Final   Parainfluenza Virus 4 NOT DETECTED NOT DETECTED Final   Respiratory Syncytial Virus NOT DETECTED NOT DETECTED Final   Bordetella pertussis NOT DETECTED NOT DETECTED Final   Chlamydophila pneumoniae NOT DETECTED NOT DETECTED Final   Mycoplasma pneumoniae NOT DETECTED NOT DETECTED Final  MRSA PCR Screening     Status: None   Collection Time: 06/14/16  2:59 PM  Result Value Ref Range Status   MRSA by PCR NEGATIVE NEGATIVE Final    Comment:        The GeneXpert MRSA Assay (FDA approved for NASAL specimens only), is one component of a comprehensive MRSA colonization surveillance program. It is not intended to diagnose MRSA infection nor to guide or monitor treatment for MRSA infections.   Culture, respiratory (NON-Expectorated)     Status: None   Collection Time: 06/14/16  4:30 PM  Result Value Ref Range Status   Specimen Description TRACHEAL ASPIRATE  Final   Special Requests NONE  Final   Gram Stain    Final    MODERATE WBC PRESENT,BOTH PMN AND MONONUCLEAR ABUNDANT GRAM POSITIVE COCCI IN PAIRS IN CLUSTERS MODERATE GRAM VARIABLE ROD FEW GRAM POSITIVE COCCI IN CHAINS    Culture MODERATE STAPHYLOCOCCUS AUREUS  Final   Report Status 06/17/2016 FINAL  Final   Organism ID, Bacteria STAPHYLOCOCCUS AUREUS  Final      Susceptibility   Staphylococcus aureus - MIC*    CIPROFLOXACIN <=0.5 SENSITIVE Sensitive     ERYTHROMYCIN <=0.25 SENSITIVE Sensitive     GENTAMICIN <=0.5 SENSITIVE Sensitive     OXACILLIN <=0.25 SENSITIVE Sensitive     TETRACYCLINE <=1 SENSITIVE Sensitive     VANCOMYCIN <=0.5 SENSITIVE Sensitive     TRIMETH/SULFA <=10 SENSITIVE Sensitive     CLINDAMYCIN <=0.25 SENSITIVE Sensitive     RIFAMPIN <=0.5 SENSITIVE Sensitive     Inducible Clindamycin NEGATIVE Sensitive     * MODERATE STAPHYLOCOCCUS AUREUS         Radiology Studies: Dg Chest Port 1 View  Result Date: 06/21/2016 CLINICAL DATA:  Cough and congestion. EXAM: PORTABLE CHEST 1 VIEW COMPARISON:  06/20/2016 .  CT 06/14/2016. FINDINGS: NG tube with tip below left hemidiaphragm. Side-hole just below the gastroesophageal junction. Prior CABG. Stable cardiomegaly. Persistent left upper lung mass. Persistent bibasilar atelectasis. Bibasilar infiltrates cannot be excluded. Tiny bilateral pleural effusions again noted. No pneumothorax. IMPRESSION: 1. NG tube noted with tip below left hemidiaphragm. Side hole is just below the GE junction. 2.  Prior CABG.  Stable cardiomegaly. 3.  Left upper lung mass again noted without interim change. 4. Low lung volumes with basilar atelectasis. Mild bibasilar infiltrates cannot be excluded. Small bilateral pleural effusions again noted . Chest is unchanged from prior exam. Electronically Signed   By: Marcello Moores  Register   On: 06/21/2016 07:38        Scheduled Meds: . atorvastatin  20 mg Per Tube q1800  . budesonide (PULMICORT) nebulizer solution  0.25 mg Nebulization BID  . calcium carbonate   1 tablet Oral BID WC  . chlorhexidine  15 mL Mouth  Rinse BID  . docusate  100 mg Per Tube BID  . gabapentin  100 mg Per Tube TID  . insulin aspart  0-15 Units Subcutaneous Q4H  . insulin glargine  20 Units Subcutaneous Daily  . ipratropium-albuterol  3 mL Nebulization TID  . mouth rinse  15 mL Mouth Rinse q12n4p  . metoprolol tartrate  25 mg Per Tube BID  . polyethylene glycol  17 g Oral Daily  . predniSONE  20 mg Per Tube Q breakfast  . QUEtiapine  25 mg Per Tube QHS  . rivaroxaban  20 mg Oral Q supper  . sennosides  5 mL Per Tube BID   Continuous Infusions: . sodium chloride Stopped (06/21/16 1828)     LOS: 8 days     Cordelia Poche, MD [facility] 06/22/2016, 1:10 PM Pager: [phone]  If 7PM-7AM, please contact night-coverage [email or web address] Password [email or web address] 06/22/2016, 1:10 PM

## 2016-06-22 NOTE — Evaluation (Signed)
Physical Therapy Evaluation Patient Details Name: [name] MRN: [id number] DOB: May 26, 1955 Today's Date: 06/22/2016   History of Present Illness  61 y.o. female admitted with VDRF, extubated 06/19/16, sepsis, PNA. PMH of COPD on 5L O2 @ home , DM, CHF, TIA.    Clinical Impression  Pt admitted with above diagnosis. Pt currently with functional limitations due to the deficits listed below (see PT Problem List). MOd assist for recliner to bed transfer. Pt unable to stand nor walk due to dizziness in standing. BP supine 120/63, sitting 126/59, not able to stand long enough to get standing BP. SNF recommended.  Pt will benefit from skilled PT to increase their independence and safety with mobility to allow discharge to the venue listed below.       Follow Up Recommendations SNF;Supervision/Assistance - 24 hour    Equipment Recommendations  None recommended by PT    Recommendations for Other Services       Precautions / Restrictions Precautions Precautions: Fall Precaution Comments: reports 1 fall in past 1 year Restrictions Weight Bearing Restrictions: No      Mobility  Bed Mobility Overal bed mobility: Needs Assistance Bed Mobility: Sit to Supine;Rolling Rolling: Mod assist     Sit to supine: Min assist   General bed mobility comments: min A BLEs into bed, +2 max A to scoot up in bed using bedpad; mod A to roll L and R to place bedpad  Transfers Overall transfer level: Needs assistance Equipment used: 1 person hand held assist Transfers: Sit to/from [facility] Sit to Stand: Mod assist Stand pivot transfers: Mod assist       General transfer comment: Mod A to rise and for SPT, pt fatigues quickly, used 5L O2 with mobility, unable to obtain SaO2 as pulse ox didn't get a reading, pt reports dizziness with sit to stand, unable to stand long enough to get BP. Supine BP 120/63, sitting 126/59  Ambulation/Gait                Stairs             Wheelchair Mobility    Modified Rankin (Stroke Patients Only)       Balance                                             Pertinent Vitals/Pain Pain Assessment: No/denies pain    Home Living Family/patient expects to be discharged to:: Private residence Living Arrangements: Spouse/significant other Available Help at Discharge: Family;Available 24 hours/day Type of Home: House Home Access: Stairs to enter   [employer] of Steps: 1 Home Layout: Multi-level Home Equipment: Wheelchair - manual;Bedside commode Additional Comments: Pt sleeps in her recliner chair and toilets in her BSC. Sponge bathes    Prior Function Level of Independence: Needs assistance   Gait / Transfers Assistance Needed: min hand held assist to transfer from her recliner to [facility] at home from husband.  Assist to go into and out of house provided by husband.  Pt reports she would love to do more, but her breathing doesn't let her.   ADL's / Homemaking Assistance Needed: assist from husband.         Hand Dominance        Extremity/Trunk Assessment   Upper Extremity Assessment Upper Extremity Assessment: Overall WFL for tasks assessed    Lower Extremity Assessment Lower  Extremity Assessment: Overall WFL for tasks assessed (knee ext 4/5 B)    Cervical / Trunk Assessment Cervical / Trunk Assessment: Normal  Communication      Cognition Arousal/Alertness: Awake/alert Behavior During Therapy: WFL for tasks assessed/performed Overall Cognitive Status: Within Functional Limits for tasks assessed                                        General Comments      Exercises     Assessment/Plan    PT Assessment Patient needs continued PT services  PT Problem List Decreased activity tolerance;Decreased mobility;Cardiopulmonary status limiting activity       PT Treatment Interventions Gait training;Functional mobility training;Therapeutic  exercise;Patient/family education;Therapeutic activities    PT Goals (Current goals can be found in the Care Plan section)  Acute Rehab PT Goals Patient Stated Goal: to get stronger PT Goal Formulation: With patient/family Time For Goal Achievement: 07/06/16 Potential to Achieve Goals: Fair    Frequency Min 3X/week   Barriers to discharge        Co-evaluation               AM-PAC PT "6 Clicks" Daily Activity  Outcome Measure Difficulty turning over in bed (including adjusting bedclothes, sheets and blankets)?: A Lot Difficulty moving from lying on back to sitting on the side of the bed? : A Lot Difficulty sitting down on and standing up from a chair with arms (e.g., wheelchair, bedside commode, etc,.)?: A Lot Help needed moving to and from a bed to chair (including a wheelchair)?: A Lot Help needed walking in hospital room?: Total Help needed climbing 3-5 steps with a railing? : Total 6 Click Score: 10    End of Session Equipment Utilized During Treatment: Gait belt;Oxygen Activity Tolerance: Patient limited by fatigue Patient left: in bed;with call bell/phone within reach;with bed alarm set;with family/visitor present Nurse Communication: Mobility status PT Visit Diagnosis: Unsteadiness on feet (R26.81);Difficulty in walking, not elsewhere classified (R26.2)    Time: 1201-1227 PT Time Calculation (min) (ACUTE ONLY): 26 min   Charges:   PT Evaluation $PT Eval Moderate Complexity: 1 Procedure PT Treatments $Therapeutic Activity: 8-22 mins   PT G Codes:          Philomena Doheny 06/22/2016, 12:43 PM  [phone]

## 2016-06-22 NOTE — Progress Notes (Signed)
Hypoglycemic Event  CBG: 60  Treatment: D50 IV 25 mL  Symptoms: None  Follow-up CBG: Time: 0429 CBG Result: 105  Possible Reasons for Event: Inadequate meal intake  Comments/MD notified:  Patient A&O, stated that feels fine. RN notified provider since this is the second hypoglycemic event in a row and the patient is NPO  RN will continue to monitor patient    Lennox Laity RN

## 2016-06-22 NOTE — Care Management Note (Signed)
Case Management Note  Patient Details  Name: [name] MRN: [id number] Date of Birth: [birth year]  Subjective/Objective:                    Action/Plan: PT recommending SNF. Awaiting OT recs. CM following for d/c disposition.   Expected Discharge Date:                  Expected Discharge Plan:  [place] (from home with husband)  In-House Referral:     Discharge planning Services  CM Consult  Post Acute Care Choice:    Choice offered to:     DME Arranged:    DME Agency:     HH Arranged:    HH Agency:     Status of Service:  In process, will continue to follow  If discussed at Long Length of Stay Meetings, dates discussed:    Additional Comments:  Pollie Friar, RN 06/22/2016, 12:47 PM

## 2016-06-22 NOTE — Progress Notes (Signed)
CSW consulted for SNF placement. CSW explained SNF recommendation to pt and pt's spouse. Pt is refusing SNF placement; wants to go home with home health services. CSW alerted RNCM.   CSW signing off.  Laveda Abbe LCSW [phone]

## 2016-06-22 NOTE — Procedures (Signed)
Objective Swallowing Evaluation: Type of Study: FEES-Fiberoptic Endoscopic Evaluation of Swallow  Patient Details  Name: [name] MRN: [id number] Date of Birth: [birth year]  Today's Date: 06/22/2016 Time: SLP Start Time (ACUTE ONLY): 0857-SLP Stop Time (ACUTE ONLY): 0935 SLP Time Calculation (min) (ACUTE ONLY): 38 min  Past Medical History:  Past Medical History:  Diagnosis Date  . Acute MI ([place])   . Arthritis   . COPD (chronic obstructive pulmonary disease) ([place])    home O2  . Coronary artery disease   . Diabetes mellitus without complication ([place])   . Shortness of breath   . Stroke [facility]) TIA    Past Surgical History:  Past Surgical History:  Procedure Laterality Date  . ABDOMINAL HYSTERECTOMY    . CARDIAC CATHETERIZATION    . CORONARY ARTERY BYPASS GRAFT     [id number]   HPI: 61 year old female with PMH of COPD (4-5L [place] at baseline), MI, stroke, DM, CAD and medical noncompliance. On 5/9 presented to ED with 2 day reported dyspnea and increased confusion. Found to have acute on chronic hypoxic/hypercapnic respiratory failure due to AECOPD. Per chart imagining revealed worsening mediastinal mass, husband states that wife knew of this however, kept putting off following up. Intubated 5/9-5/14. CXR Low lung volumes with basilar atelectasis. Mild bibasilar  No Data Recorded   Assessment / Plan / Recommendation  CHL IP CLINICAL IMPRESSIONS 06/22/2016  Clinical Impression FEES completed; edematous arytenoid cartiliges observed. Pt with intermittent coughing and nausea/expectoration of mucous prior to assessment. She exhibited mild pharyngeal dysphagia marked by mild pyriform sinsus residue. Mild interarytenoid space residual however no laryngeal penetration or aspiration observed despite intermittent coughing (could have compromised airway then coughed out?). Recommend Dys 3 texture (due to lack of adequate dentition) and thin liquids, pills whole with thin, sit upright, small bites/sips,  rest breaks if needed. ST will continue to follow.       SLP Visit Diagnosis Dysphagia, pharyngeal phase (R13.13)  Attention and concentration deficit following --  Frontal lobe and executive function deficit following --  Impact on safety and function Mild aspiration risk      CHL IP TREATMENT RECOMMENDATION 06/22/2016  Treatment Recommendations Therapy as outlined in treatment plan below     Prognosis 06/22/2016  Prognosis for Safe Diet Advancement Good  Barriers to Reach Goals --  Barriers/Prognosis Comment --    CHL IP DIET RECOMMENDATION 06/22/2016  SLP Diet Recommendations Dysphagia 3 ([employer] soft) solids;Thin liquid  Liquid Administration via Cup;Straw  Medication Administration Whole meds with liquid  Compensations Slow rate;Small sips/bites  Postural Changes Seated upright at 90 degrees      CHL IP OTHER RECOMMENDATIONS 06/22/2016  Recommended Consults --  Oral Care Recommendations Oral care BID  Other Recommendations --      CHL IP FOLLOW UP RECOMMENDATIONS 06/22/2016  Follow up Recommendations None      CHL IP FREQUENCY AND DURATION 06/22/2016  Speech Therapy Frequency (ACUTE ONLY) min 2x/week  Treatment Duration 2 weeks           CHL IP ORAL PHASE 06/22/2016  Oral Phase WFL  Oral - Pudding Teaspoon --  Oral - Pudding Cup --  Oral - Honey Teaspoon --  Oral - Honey Cup --  Oral - Nectar Teaspoon --  Oral - Nectar Cup --  Oral - Nectar Straw --  Oral - Thin Teaspoon --  Oral - Thin Cup --  Oral - Thin Straw --  Oral - Puree --  Oral - Mech Soft --  Oral - Regular --  Oral - Multi-Consistency --  Oral - Pill --  Oral Phase - Comment --    CHL IP PHARYNGEAL PHASE 06/22/2016  Pharyngeal Phase Impaired  Pharyngeal- Pudding Teaspoon --  Pharyngeal --  Pharyngeal- Pudding Cup --  Pharyngeal --  Pharyngeal- Honey Teaspoon --  Pharyngeal --  Pharyngeal- Honey Cup --  Pharyngeal --  Pharyngeal- Nectar Teaspoon --  Pharyngeal --  Pharyngeal- Nectar Cup --   Pharyngeal --  Pharyngeal- Nectar Straw --  Pharyngeal --  Pharyngeal- Thin Teaspoon --  Pharyngeal --  Pharyngeal- Thin Cup Pharyngeal residue - pyriform  Pharyngeal --  Pharyngeal- Thin Straw Pharyngeal residue - pyriform  Pharyngeal --  Pharyngeal- Puree --  Pharyngeal --  Pharyngeal- Mechanical Soft --  Pharyngeal --  Pharyngeal- Regular WFL  Pharyngeal --  Pharyngeal- Multi-consistency --  Pharyngeal --  Pharyngeal- Pill --  Pharyngeal --  Pharyngeal Comment --     CHL IP CERVICAL ESOPHAGEAL PHASE 06/22/2016  Cervical Esophageal Phase WFL  Pudding Teaspoon --  Pudding Cup --  Honey Teaspoon --  Honey Cup --  Nectar Teaspoon --  Nectar Cup --  Nectar Straw --  Thin Teaspoon --  Thin Cup --  Thin Straw --  Puree --  Mechanical Soft --  Regular --  Multi-consistency --  Pill --  Cervical Esophageal Comment --    No flowsheet data found.  Houston Siren 06/22/2016, 1:44 PM  Orbie Pyo Colvin Caroli.[employer] [phone]

## 2016-06-22 NOTE — Progress Notes (Signed)
Hypoglycemic Event  CBG: 65  Treatment: D50 IV 25 mL  Symptoms: None  Follow-up CBG: Time: 0003 CBG Result: 113  Possible Reasons for Event: Inadequate meal intake  Comments/MD notified:  Patient advised that she felt fine.  RN will continue to monitor patient    Lennox Laity

## 2016-06-23 DIAGNOSIS — I5032 Chronic diastolic (congestive) heart failure: Secondary | ICD-10-CM

## 2016-06-23 DIAGNOSIS — I10 Essential (primary) hypertension: Secondary | ICD-10-CM

## 2016-06-23 LAB — GLUCOSE, CAPILLARY
GLUCOSE-CAPILLARY: 69 mg/dL (ref 65–99)
Glucose-Capillary: 52 mg/dL — ABNORMAL LOW (ref 65–99)
Glucose-Capillary: 82 mg/dL (ref 65–99)
Glucose-Capillary: 82 mg/dL (ref 65–99)
Glucose-Capillary: 207 mg/dL — ABNORMAL HIGH (ref 65–99)
Glucose-Capillary: 352 mg/dL — ABNORMAL HIGH (ref 65–99)

## 2016-06-23 LAB — BASIC METABOLIC PANEL
ANION GAP: 7 (ref 5–15)
BUN: 8 mg/dL (ref 6–20)
CHLORIDE: 95 mmol/L — AB (ref 101–111)
CO2: 37 mmol/L — AB (ref 22–32)
Calcium: 8.9 mg/dL (ref 8.9–10.3)
Creatinine, Ser: 0.46 mg/dL (ref 0.44–1.00)
GFR calc Af Amer: >60 mL/min (ref >60)
GLUCOSE: 82 mg/dL (ref 65–99)
POTASSIUM: 3.6 mmol/L (ref 3.5–5.1)
Sodium: 139 mmol/L (ref 135–145)

## 2016-06-23 MED ORDER — METOPROLOL TARTRATE 25 MG PO TABS: 25.0000 mg | ORAL_TABLET | Freq: Two times a day (BID) | ORAL | Status: DC

## 2016-06-23 MED ORDER — GLUCOSE 40 % PO GEL
ORAL | Status: AC
  Administered 2016-06-23: 37.5 g
  Filled 2016-06-23: qty 1

## 2016-06-23 MED ORDER — PREDNISONE 20 MG PO TABS: 20.0000 mg | ORAL_TABLET | Freq: Every day | ORAL | Status: DC

## 2016-06-23 MED ORDER — PREDNISONE 20 MG PO TABS: ORAL_TABLET | ORAL | 0 refills | Status: DC

## 2016-06-23 MED ORDER — QUETIAPINE FUMARATE 25 MG PO TABS: 25.0000 mg | ORAL_TABLET | Freq: Every day | ORAL | Status: DC

## 2016-06-23 MED ORDER — HYDROCODONE-ACETAMINOPHEN 5-325 MG PO TABS: 1.0000 | ORAL_TABLET | Freq: Four times a day (QID) | ORAL | 0 refills | Status: AC | PRN

## 2016-06-23 MED ORDER — LABETALOL HCL 5 MG/ML IV SOLN: 10.0000 mg | INTRAVENOUS | Status: DC | PRN

## 2016-06-23 MED ORDER — ATORVASTATIN CALCIUM 10 MG PO TABS
20.0000 mg | ORAL_TABLET | Freq: Every day | ORAL | Status: DC
  Administered 2016-06-23: 20 mg via ORAL
  Filled 2016-06-23: qty 2

## 2016-06-23 MED ORDER — INSULIN GLARGINE 100 UNIT/ML ~~LOC~~ SOLN
15.0000 [IU] | Freq: Every day | SUBCUTANEOUS | Status: DC
  Administered 2016-06-23: 15 [IU] via SUBCUTANEOUS
  Filled 2016-06-23: qty 0.15

## 2016-06-23 MED ORDER — SENNOSIDES-DOCUSATE SODIUM 8.6-50 MG PO TABS: 1.0000 | ORAL_TABLET | Freq: Two times a day (BID) | ORAL | Status: DC

## 2016-06-23 MED ORDER — GABAPENTIN 100 MG PO CAPS
100.0000 mg | ORAL_CAPSULE | Freq: Three times a day (TID) | ORAL | Status: DC
  Administered 2016-06-23 (×2): 100 mg via ORAL
  Filled 2016-06-23 (×2): qty 1

## 2016-06-23 NOTE — Care Management Note (Signed)
Case Management Note  Patient Details  Name: [name] MRN: [id number] Date of Birth: [birth year]  Subjective/Objective:    sepsis, PNA. PMH of COPD on 5L O2 @ home , DM, CHF, TIA.  per CSW note patient is refusing SNF and wants [facility] services.  NCM spoke with patient and spouse at the bedside, gave her the [facility] agency list, offered choic, spouse states if all they are going to do is stay 30 to 40 mins then we may not need HH .  Patient states her son is a occupational therapist.  NCM left the list with patient and spouse and informed them to let RN know when they are ready to choose agency.                 Action/Plan: NCM will follow for dc needs.  Expected Discharge Date:                  Expected Discharge Plan:  [place] (from home with husband)  In-House Referral:     Discharge planning Services  CM Consult  Post Acute Care Choice:    Choice offered to:     DME Arranged:    DME Agency:     HH Arranged:    HH Agency:     Status of Service:  In process, will continue to follow  If discussed at Long Length of Stay Meetings, dates discussed:    Additional Comments:  Zenon Mayo, RN 06/23/2016, 11:44 AM

## 2016-06-23 NOTE — Discharge Summary (Signed)
Physician Discharge Summary  [name] NFA:[id number] DOB: 1955/10/21 DOA: 06/14/2016  PCP: Patient, No Pcp Per  Admit date: 06/14/2016 Discharge date: 06/23/2016  Admitted From: Home Disposition: Home  Recommendations for Outpatient Follow-up:  1. Follow up with pulmonology 2. Prednisone taper 3. Left upper lobe lung mass needs further workup  Discharge Condition: Stable CODE STATUS: Full code Diet recommendation: Dysphagia 3 diet   Brief/Interim Summary:  Admission HPI written by Rush Farmer, MD   CHIEF COMPLAINT:  Dyspnea   HISTORY OF PRESENT ILLNESS:  Information obtained from medical record as patient is intubated and sedated.  60 year old female with PMH of COPD (4-5L [place] at baseline), DM, CAD and medical noncompliance. Recent hospitalization 10/26 for exacerbation requiring intubation. 5/9 presented to ED with 2 day reported dyspnea and increased confusion. Today husband states that he was unable to arouse patient so EMS was called. Per EMS oxygen saturation 73% on 5L [place]. Upon arrival to ED patient was placed on a nonrebreather at 15L and given Duoneb and solu-medrol. Initially had short term improvement however required intubation.  ABG 7.35/>100/233. PCCM asked to admit.   Imagining revealed worsening mediastinal mass, husband states that wife knew of this however, kept putting off following up. On 3/8 patient presented to outpatient pulmonary office (Dr. Chase Caller) - noted to have advanced COPD and cor pulmonale, patient stated she does not desire to use CPAP/BiPAP at home.     Hospital course:  Acute on chronic respiratory failure with hypoxia and hypercapnia Patient with baseline advanced COPD on 5L oxygen as an outpatient. Patient required intubation on 5/9 and was extubated on 5/14. She developed MSSA pneumonia sensitive to tetracyclines. Started on NG tube feeds secondary to endotracheal intubation which was discontinued shortly after extubation. Patient treated  with Doxycycline and transitioned to Cefazolin when NPO to treat pneumonia.  Sepsis Does not appear to be present on admission per chart review. Secondary to pneumonia. Initial lactic acidosis of 1.01, up to 2.33 which improved to 1.8 with IV fluid resuscitation. Physiology resolved.  Diabetes mellitus, type 2 Sliding scale insulin while inpatient. Continue Tresiba on discharge.  Chronic cor pulmonale Chronic diastolic CHF Euvolemic. Continue metoprolol.  CAD No chest pain. Continue statin.  Coagulopathy Secondary to Xarelto.   Anemia Unknown etiology. Normochromic. Iron panel significant only for a slightly low TIBC. Stable. Outpatient follow-up.  Metabolic encephalopathy Secondary to hypoxia. Resolved with resolution of respiratory failure.  Lung mass This is not new and per chart review, patient has put off evaluation. Discussed with patient and husband and they will follow-up with pulmonology as an outpatient.  History of TIA Patient is on Xarelto as a chronic medication   Discharge Diagnoses:  Principal Problem:   Acute respiratory failure with hypercapnia ([place]) Active Problems:   Essential hypertension   Coronary artery disease without angina pectoris   Ventilator dependent (HCC)   Chronic diastolic CHF (congestive heart failure) (HCC)   Mass of upper lobe of left lung    Discharge Instructions  Discharge Instructions    Call MD for:  difficulty breathing, headache or visual disturbances    Complete by:  As directed    Call MD for:  persistant nausea and vomiting    Complete by:  As directed    Call MD for:  temperature >100.4    Complete by:  As directed    Diet - low sodium heart healthy    Complete by:  As directed    Increase activity slowly  Complete by:  As directed      Allergies as of 06/23/2016      Reactions   Ciprofloxacin Rash   REACTION: hives/rash      Medication List    STOP taking these medications   albuterol (2.5  MG/3ML) 0.083% nebulizer solution Commonly known as:  PROVENTIL   isosorbide mononitrate 60 MG 24 hr tablet Commonly known as:  IMDUR   PROAIR HFA 108 (90 Base) MCG/ACT inhaler Generic drug:  albuterol     TAKE these medications   atorvastatin 20 MG tablet Commonly known as:  LIPITOR Take 1 tablet (20 mg total) by mouth daily.   gabapentin 100 MG capsule Commonly known as:  NEURONTIN Take 100 mg by mouth 3 (three) times daily.   HYDROcodone-acetaminophen 5-325 MG tablet Commonly known as:  NORCO/VICODIN Take 1-2 tablets by mouth every 6 (six) hours as needed for moderate pain.   insulin aspart 100 UNIT/ML FlexPen Commonly known as:  NOVOLOG FLEXPEN Inject 6 Units into the skin 3 (three) times daily with meals. What changed:  how much to take   Ipratropium-Albuterol 20-100 MCG/ACT Aers respimat Commonly known as:  COMBIVENT RESPIMAT Inhale 1-2 puffs into the lungs every 4 (four) hours as needed for wheezing.   ipratropium-albuterol 0.5-2.5 (3) MG/3ML Soln Commonly known as:  DUONEB Take 3 mLs by nebulization every 4 (four) hours as needed for shortness of breath or wheezing.   metoprolol tartrate 50 MG tablet Commonly known as:  LOPRESSOR Take 1 tablet (50 mg total) by mouth 2 (two) times daily. What changed:  how much to take   predniSONE 20 MG tablet Commonly known as:  DELTASONE Take '20mg'$  for 3 days, then take 10 mg for 4 days Start taking on:  06/24/2016   rivaroxaban 20 MG Tabs tablet Commonly known as:  XARELTO Take 1 tablet (20 mg total) by mouth daily.   TRESIBA FLEXTOUCH 100 UNIT/ML Sopn FlexTouch Pen Generic drug:  insulin degludec Inject 20 Units into the skin daily.      Follow-up Information    [facility] [place], [place] Charolette Forward, MD Follow up on 06/28/2016.   Specialty:  Pulmonary Disease Why:  9:30 AM - [facility] Contact information: [address] [place] [address] [phone]          Allergies  Allergen Reactions  .  Ciprofloxacin Rash    REACTION: hives/rash    Consultations:  Pulmonology/CCM   Procedures/Studies: Ct Chest W Contrast  Result Date: 06/14/2016 CLINICAL DATA:  Respiratory failure.  Hypoxia.  Dyspnea.  Cough. EXAM: CT CHEST WITH CONTRAST TECHNIQUE: Multidetector CT imaging of the chest was performed during intravenous contrast administration. CONTRAST:  79m ISOVUE-300 IOPAMIDOL (ISOVUE-300) INJECTION 61% COMPARISON:  Chest radiograph from earlier today. 07/01/2006 chest CT angiogram FINDINGS: Cardiovascular: Top-normal heart size. No significant pericardial fluid/thickening. Left main, left anterior descending, left circumflex and right coronary atherosclerosis status post CABG. Atherosclerotic nonaneurysmal thoracic aorta. Top-normal caliber pulmonary arteries (main pulmonary artery diameter 3.2 cm). No central pulmonary emboli. Mediastinum/Nodes: No discrete thyroid nodules. Enteric tube enters the distal stomach with the tip overlying the descending duodenum on the scout topogram. Otherwise unremarkable esophagus. No axillary adenopathy. Mildly enlarged 1.0 cm right lower paratracheal node (series 3/ image 57). Enlarged 1.7 cm AP window node (series 3/ image 56). No additional pathologically enlarged mediastinal or hilar nodes. Lungs/Pleura: No pneumothorax. Small to moderate bilateral pleural effusions. Endotracheal tube tip is 3.0 cm above the carina. There is a suggestion of bronchomalacia  involving the left and right bronchi. Moderate compressive atelectasis in the dependent right lower lobe. Marked compressive atelectasis in the left lower lobe. Moderate to severe centrilobular emphysema with diffuse bronchial wall thickening. Central left upper lobe 4.7 x 4.5 cm lung mass (series 7/ image 58), which abuts the mediastinal pleura and left upper hilum. Patchy consolidation in the left upper lobe peripheral to the lung mass. Upper abdomen: Left adrenal 1.2 cm nodule (series 3/ image 151) with  density 24 HU. Musculoskeletal: No aggressive appearing focal osseous lesions. Intact sternotomy wires. Mild thoracic spondylosis. IMPRESSION: 1. Well-positioned support structures as detailed . 2. Central left upper lobe 4.7 cm lung mass abutting the mediastinal pleura and left upper hilum, increased in size back to the October 2017 radiographs, compatible with primary bronchogenic carcinoma. 3. AP window and right paratracheal mediastinal lymphadenopathy, suspicious for nodal metastases. 4. Indeterminate left adrenal nodule, metastasis not excluded. 5. Patchy consolidation in the peripheral left upper lobe, compatible with postobstructive pneumonia . 6. Small dependent bilateral pleural effusions with associated lower lobe atelectasis. 7. Moderate to severe emphysema with diffuse bronchial wall thickening, suggesting COPD . 8. 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 9. Aortic atherosclerosis. 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 Electronically Signed   By: Ilona Sorrel [name]   On: 06/14/2016 12:17   Dg Chest Port 1 View  Result Date: 06/21/2016 CLINICAL DATA:  Cough and congestion. EXAM: PORTABLE CHEST 1 VIEW COMPARISON:  06/20/2016 .  CT 06/14/2016. FINDINGS: NG tube with tip below left hemidiaphragm. Side-hole just below the gastroesophageal junction. Prior CABG. Stable cardiomegaly. Persistent left upper lung mass. Persistent bibasilar atelectasis. Bibasilar infiltrates cannot be excluded. Tiny bilateral pleural effusions again noted. No pneumothorax. IMPRESSION: 1. NG tube noted with tip below left hemidiaphragm. Side hole is just below the GE junction. 2.  Prior CABG.  Stable cardiomegaly. 3.  Left upper lung mass again noted without interim change. 4. Low lung volumes with basilar atelectasis. Mild bibasilar infiltrates cannot be excluded. Small bilateral pleural effusions again noted . Chest is unchanged from prior exam. Electronically Signed   By: Marcello Moores   Register   On: 06/21/2016 07:38   Dg Chest Port 1 View  Result Date: 06/20/2016 CLINICAL DATA:  Respiratory failure. EXAM: PORTABLE CHEST 1 VIEW COMPARISON:  06/17/2016. FINDINGS: Interim extubation. NG tube noted with its tip below left hemidiaphragm. Prior CABG. Stable cardiomegaly. Left upper lobe mass lesion again noted. Bibasilar pulmonary infiltrates and or edema and small bilateral pleural effusions again noted. Slight worsening from prior exam. No pneumothorax. IMPRESSION: 1.  Interim extubation.  NG tube in stable position. 2.  Left upper lobe mass lesion unchanged. 3. Prior CABG. Stable cardiomegaly. Progression of bibasilar infiltrates/edema and bilateral pleural effusions. Findings suggest a component of congestive heart failure. Electronically Signed   By: Marcello Moores  Register   On: 06/20/2016 06:41   Dg Chest Port 1 View  Result Date: 06/17/2016 CLINICAL DATA:  Acute respiratory failure. EXAM: PORTABLE CHEST 1 VIEW COMPARISON:  Jun 15, 2016 FINDINGS: The patient's known left perihilar mass is stable. No pneumothorax is identified. The ETT is in stable position. The NG tube terminates below today's film. Small effusions and bibasilar opacities are stable. No interval change. IMPRESSION: The patient's known left perihilar mass is stable. The ETT is in good position. Small bibasilar effusions and opacities remain. Electronically Signed   By: Dorise Bullion III M.D   On: 06/17/2016 08:14   Dg Chest Port 1  View  Result Date: 06/15/2016 CLINICAL DATA:  Acute on chronic respiratory failure, intubated patient, left perihilar mass, coronary artery disease, former smoker, morbid obesity. EXAM: PORTABLE CHEST 1 VIEW COMPARISON:  CT scan chest of Jun 14, 2016 FINDINGS: The lungs are reasonably well expanded. A dominant mass is present in the left perihilar region. It measures approximately 4 cm in diameter. The interstitial markings of both lungs are coarse. Small bibasilar pleural effusions are present.  The heart is top-normal in size. The pulmonary vascularity is not engorged. There is calcification in the wall of the aortic arch. The patient has undergone previous median sternotomy. The endotracheal tube tip lies 3.3 cm above the carina. The esophagogastric tube tip projects below the inferior margin of the image. IMPRESSION: Chronic bronchitic changes. Left perihilar soft tissue mass. No acute pneumonia. Small bilateral pleural effusions. The support tubes are in reasonable position where visualized. Thoracic aortic atherosclerosis. Electronically Signed   By: David  Martinique [name]   On: 06/15/2016 07:23   Dg Chest Portable 1 View  Result Date: 06/14/2016 CLINICAL DATA:  Ventilator dependence. EXAM: PORTABLE CHEST 1 VIEW COMPARISON:  06/14/2016 FINDINGS: 1102 hours. Endotracheal tube tip is 5.5 cm above the base the carina. The NG tube passes into the stomach although the distal tip position is not included on the film. The cardio pericardial silhouette is enlarged. Interstitial markings are diffusely coarsened with chronic features. Left parahilar mass lesion again noted. Telemetry leads overlie the chest. IMPRESSION: Endotracheal tube tip about 5.5 cm above the base the carina. Cardiomegaly with underlying interstitial changes and stable appearance left parahilar mass. Electronically Signed   By: Misty Stanley [name]   On: 06/14/2016 11:14   Dg Chest Portable 1 View  Result Date: 06/14/2016 CLINICAL DATA:  Respiratory distress EXAM: PORTABLE CHEST 1 VIEW COMPARISON:  12/05/2015 FINDINGS: Median sternotomy. Heart size within normal limits. Negative for heart failure. COPD. Left upper lobe and left hilar mass lesion has progressed since the prior study and is consistent with carcinoma. Chest CT recommended. Mild bibasilar atelectasis. IMPRESSION: Left hilar and left upper lobe mass lesion has progressed since the prior study and is consistent with carcinoma of the lung. CT chest with contrast recommended for  further evaluation. Electronically Signed   By: Franchot Gallo [name]   On: 06/14/2016 09:01   Dg Abd Portable 1v  Result Date: 06/19/2016 CLINICAL DATA:  NG tube placement EXAM: PORTABLE ABDOMEN - 1 VIEW COMPARISON:  06/18/2016 FINDINGS: Post sternotomy changes with linear atelectasis at the left lung base. Esophageal tube tip and side port overlie the proximal stomach. Persistent gaseous dilatation of bowel in the upper abdomen. IMPRESSION: Esophageal tube tip and side-port overlies the proximal stomach. Continued gaseous dilatation of the bowel. Electronically Signed   By: Donavan Foil [name]   On: 06/19/2016 23:37   Dg Abd Portable 1v  Result Date: 06/18/2016 CLINICAL DATA:  Abdominal distention.  NG tube placement. EXAM: PORTABLE ABDOMEN - 1 VIEW COMPARISON:  12/03/2015 FINDINGS: An NG tube is identified with tip overlying the distal stomach. Distended gas-filled bowel loops are noted, mouth wrist with represent colon. IMPRESSION: NG tube with tip overlying the distal stomach. Electronically Signed   By: Margarette Canada [name]   On: 06/18/2016 19:55     Subjective: Patient reports improvement in dyspnea. No chest pain.  Discharge Exam: Vitals:   06/23/16 0912 06/23/16 1349  BP: 112/64 (!) 150/54  Pulse: 100 65  Resp: 20 20  Temp: 97.8 F (36.6 C) 97.7  F (36.5 C)   Vitals:   06/23/16 0031 06/23/16 0711 06/23/16 0912 06/23/16 1349  BP: (!) 108/52 (!) 124/54 112/64 (!) 150/54  Pulse: 86 88 100 65  Resp: '20 20 20 20  '$ Temp: 98.2 F (36.8 C) 97.9 F (36.6 C) 97.8 F (36.6 C) 97.7 F (36.5 C)  TempSrc: Oral Oral Oral Oral  SpO2: 91% 94% 98% 100%  Weight:      Height:        General: Pt is alert, awake, not in acute distress Cardiovascular: RRR, S1/S2 +, no rubs, no gallops Respiratory: CTA bilaterally, no wheezing, no rhonchi, diminished throughout Abdominal: Soft, NT, ND, bowel sounds + Extremities: no edema, no cyanosis    The results of significant diagnostics from this  hospitalization (including imaging, microbiology, ancillary and laboratory) are listed below for reference.     Microbiology: Recent Results (from the past 240 hour(s))  Blood Culture (routine x 2)     Status: None   Collection Time: 06/14/16  8:55 AM  Result Value Ref Range Status   Specimen Description BLOOD LEFT ANTECUBITAL  Final   Special Requests   Final    BOTTLES DRAWN AEROBIC AND ANAEROBIC Blood Culture adequate volume   Culture NO GROWTH 5 DAYS  Final   Report Status 06/19/2016 FINAL  Final  Blood Culture (routine x 2)     Status: None   Collection Time: 06/14/16  8:58 AM  Result Value Ref Range Status   Specimen Description BLOOD RIGHT HAND  Final   Special Requests   Final    BOTTLES DRAWN AEROBIC ONLY Blood Culture adequate volume   Culture NO GROWTH 5 DAYS  Final   Report Status 06/19/2016 FINAL  Final  Respiratory Panel by PCR     Status: None   Collection Time: 06/14/16  1:03 PM  Result Value Ref Range Status   Adenovirus NOT DETECTED NOT DETECTED Final   Coronavirus 229E NOT DETECTED NOT DETECTED Final   Coronavirus HKU1 NOT DETECTED NOT DETECTED Final   Coronavirus NL63 NOT DETECTED NOT DETECTED Final   Coronavirus OC43 NOT DETECTED NOT DETECTED Final   Metapneumovirus NOT DETECTED NOT DETECTED Final   Rhinovirus / Enterovirus NOT DETECTED NOT DETECTED Final   Influenza A NOT DETECTED NOT DETECTED Final   Influenza B NOT DETECTED NOT DETECTED Final   Parainfluenza Virus 1 NOT DETECTED NOT DETECTED Final   Parainfluenza Virus 2 NOT DETECTED NOT DETECTED Final   Parainfluenza Virus 3 NOT DETECTED NOT DETECTED Final   Parainfluenza Virus 4 NOT DETECTED NOT DETECTED Final   Respiratory Syncytial Virus NOT DETECTED NOT DETECTED Final   Bordetella pertussis NOT DETECTED NOT DETECTED Final   Chlamydophila pneumoniae NOT DETECTED NOT DETECTED Final   Mycoplasma pneumoniae NOT DETECTED NOT DETECTED Final  MRSA PCR Screening     Status: None   Collection Time:  06/14/16  2:59 PM  Result Value Ref Range Status   MRSA by PCR NEGATIVE NEGATIVE Final    Comment:        The GeneXpert MRSA Assay (FDA approved for NASAL specimens only), is one component of a comprehensive MRSA colonization surveillance program. It is not intended to diagnose MRSA infection nor to guide or monitor treatment for MRSA infections.   Culture, respiratory (NON-Expectorated)     Status: None   Collection Time: 06/14/16  4:30 PM  Result Value Ref Range Status   Specimen Description TRACHEAL ASPIRATE  Final   Special Requests NONE  Final  Gram Stain   Final    MODERATE WBC PRESENT,BOTH PMN AND MONONUCLEAR ABUNDANT GRAM POSITIVE COCCI IN PAIRS IN CLUSTERS MODERATE GRAM VARIABLE ROD FEW GRAM POSITIVE COCCI IN CHAINS    Culture MODERATE STAPHYLOCOCCUS AUREUS  Final   Report Status 06/17/2016 FINAL  Final   Organism ID, Bacteria STAPHYLOCOCCUS AUREUS  Final      Susceptibility   Staphylococcus aureus - MIC*    CIPROFLOXACIN <=0.5 SENSITIVE Sensitive     ERYTHROMYCIN <=0.25 SENSITIVE Sensitive     GENTAMICIN <=0.5 SENSITIVE Sensitive     OXACILLIN <=0.25 SENSITIVE Sensitive     TETRACYCLINE <=1 SENSITIVE Sensitive     VANCOMYCIN <=0.5 SENSITIVE Sensitive     TRIMETH/SULFA <=10 SENSITIVE Sensitive     CLINDAMYCIN <=0.25 SENSITIVE Sensitive     RIFAMPIN <=0.5 SENSITIVE Sensitive     Inducible Clindamycin NEGATIVE Sensitive     * MODERATE STAPHYLOCOCCUS AUREUS     Labs: BNP (last 3 results)  Recent Labs  12/02/15 2118 06/14/16 0855  BNP 184.6* 829.9*   Basic Metabolic Panel:  Recent Labs Lab 06/17/16 0219 06/18/16 0303 06/19/16 0318 06/20/16 0346 06/23/16 0709  NA 135 137 141 139 139  K 4.5 4.2 3.9 3.6 3.6  CL 95* 97* 98* 97* 95*  CO2 33* 32 35* 37* 37*  GLUCOSE 239* 125* 145* 94 82  BUN 24* 25* 22* 15 8  CREATININE 0.68 0.60 0.57 0.50 0.46  CALCIUM 8.9 8.7* 8.9 9.0 8.9  MG  --   --  2.0 1.7  --   PHOS  --   --  3.0 3.1  --    Liver Function  Tests:  Recent Labs Lab 06/19/16 0318  ALBUMIN 2.4*   No results for input(s): LIPASE, AMYLASE in the last 168 hours. No results for input(s): AMMONIA in the last 168 hours. CBC:  Recent Labs Lab 06/17/16 0219 06/18/16 0303 06/19/16 0318 06/20/16 0346 06/22/16 0539  WBC 13.7* 12.4* 10.6* 14.2* 10.1  NEUTROABS 12.0* 9.9* 8.2*  --   --   HGB 10.3* 10.5* 11.1* 11.0* 11.5*  HCT 36.4 36.3 38.6 38.4 40.9  MCV 94.8 94.5 94.8 95.0 94.9  PLT 171 139* 139* 177 223   Cardiac Enzymes: No results for input(s): CKTOTAL, CKMB, CKMBINDEX, TROPONINI in the last 168 hours. BNP: Invalid input(s): POCBNP CBG:  Recent Labs Lab 06/23/16 0403 06/23/16 0442 06/23/16 0449 06/23/16 0733 06/23/16 1126  GLUCAP 52* 69 82 82 207*   D-Dimer No results for input(s): DDIMER in the last 72 hours. Hgb A1c No results for input(s): HGBA1C in the last 72 hours. Lipid Profile No results for input(s): CHOL, HDL, LDLCALC, TRIG, CHOLHDL, LDLDIRECT in the last 72 hours. Thyroid function studies No results for input(s): TSH, T4TOTAL, T3FREE, THYROIDAB in the last 72 hours.  Invalid input(s): FREET3 Anemia work up  Recent Labs  06/22/16 0539  VITAMINB12 784  FOLATE 14.4  FERRITIN 267  TIBC 225*  IRON 38  RETICCTPCT 2.1   Urinalysis    Component Value Date/Time   COLORURINE YELLOW 06/14/2016 [place] 06/14/2016 1407   LABSPEC 1.016 06/14/2016 1407   PHURINE 6.0 06/14/2016 1407   GLUCOSEU NEGATIVE 06/14/2016 1407   HGBUR NEGATIVE 06/14/2016 1407   [place] 06/14/2016 1407   [place] 06/14/2016 1407   PROTEINUR 30 (A) 06/14/2016 1407   NITRITE NEGATIVE 06/14/2016 1407   LEUKOCYTESUR NEGATIVE 06/14/2016 1407   Sepsis Labs Invalid input(s): PROCALCITONIN,  WBC,  LACTICIDVEN Microbiology Recent Results (  from the past 240 hour(s))  Blood Culture (routine x 2)     Status: None   Collection Time: 06/14/16  8:55 AM  Result Value Ref Range Status    Specimen Description BLOOD LEFT ANTECUBITAL  Final   Special Requests   Final    BOTTLES DRAWN AEROBIC AND ANAEROBIC Blood Culture adequate volume   Culture NO GROWTH 5 DAYS  Final   Report Status 06/19/2016 FINAL  Final  Blood Culture (routine x 2)     Status: None   Collection Time: 06/14/16  8:58 AM  Result Value Ref Range Status   Specimen Description BLOOD RIGHT HAND  Final   Special Requests   Final    BOTTLES DRAWN AEROBIC ONLY Blood Culture adequate volume   Culture NO GROWTH 5 DAYS  Final   Report Status 06/19/2016 FINAL  Final  Respiratory Panel by PCR     Status: None   Collection Time: 06/14/16  1:03 PM  Result Value Ref Range Status   Adenovirus NOT DETECTED NOT DETECTED Final   Coronavirus 229E NOT DETECTED NOT DETECTED Final   Coronavirus HKU1 NOT DETECTED NOT DETECTED Final   Coronavirus NL63 NOT DETECTED NOT DETECTED Final   Coronavirus OC43 NOT DETECTED NOT DETECTED Final   Metapneumovirus NOT DETECTED NOT DETECTED Final   Rhinovirus / Enterovirus NOT DETECTED NOT DETECTED Final   Influenza A NOT DETECTED NOT DETECTED Final   Influenza B NOT DETECTED NOT DETECTED Final   Parainfluenza Virus 1 NOT DETECTED NOT DETECTED Final   Parainfluenza Virus 2 NOT DETECTED NOT DETECTED Final   Parainfluenza Virus 3 NOT DETECTED NOT DETECTED Final   Parainfluenza Virus 4 NOT DETECTED NOT DETECTED Final   Respiratory Syncytial Virus NOT DETECTED NOT DETECTED Final   Bordetella pertussis NOT DETECTED NOT DETECTED Final   Chlamydophila pneumoniae NOT DETECTED NOT DETECTED Final   Mycoplasma pneumoniae NOT DETECTED NOT DETECTED Final  MRSA PCR Screening     Status: None   Collection Time: 06/14/16  2:59 PM  Result Value Ref Range Status   MRSA by PCR NEGATIVE NEGATIVE Final    Comment:        The GeneXpert MRSA Assay (FDA approved for NASAL specimens only), is one component of a comprehensive MRSA colonization surveillance program. It is not intended to diagnose  MRSA infection nor to guide or monitor treatment for MRSA infections.   Culture, respiratory (NON-Expectorated)     Status: None   Collection Time: 06/14/16  4:30 PM  Result Value Ref Range Status   Specimen Description TRACHEAL ASPIRATE  Final   Special Requests NONE  Final   Gram Stain   Final    MODERATE WBC PRESENT,BOTH PMN AND MONONUCLEAR ABUNDANT GRAM POSITIVE COCCI IN PAIRS IN CLUSTERS MODERATE GRAM VARIABLE ROD FEW GRAM POSITIVE COCCI IN CHAINS    Culture MODERATE STAPHYLOCOCCUS AUREUS  Final   Report Status 06/17/2016 FINAL  Final   Organism ID, Bacteria STAPHYLOCOCCUS AUREUS  Final      Susceptibility   Staphylococcus aureus - MIC*    CIPROFLOXACIN <=0.5 SENSITIVE Sensitive     ERYTHROMYCIN <=0.25 SENSITIVE Sensitive     GENTAMICIN <=0.5 SENSITIVE Sensitive     OXACILLIN <=0.25 SENSITIVE Sensitive     TETRACYCLINE <=1 SENSITIVE Sensitive     VANCOMYCIN <=0.5 SENSITIVE Sensitive     TRIMETH/SULFA <=10 SENSITIVE Sensitive     CLINDAMYCIN <=0.25 SENSITIVE Sensitive     RIFAMPIN <=0.5 SENSITIVE Sensitive     Inducible Clindamycin NEGATIVE  Sensitive     * MODERATE STAPHYLOCOCCUS AUREUS     Time coordinating discharge: Over 30 minutes  SIGNED:   Cordelia Poche, MD [facility] 06/23/2016, 4:08 PM Pager [phone]  If 7PM-7AM, please contact night-coverage [email or web address] Password [email or web address]

## 2016-06-23 NOTE — Progress Notes (Signed)
Occupational Therapy Evaluation Patient Details Name: [name] MRN: [id number] DOB: [birth year] Today's Date: 06/23/2016    History of Present Illness 61 y.o. female admitted with VDRF, extubated 06/19/16, sepsis, PNA. PMH of COPD on 5L O2 @ home , DM, CHF, TIA.     Clinical Impression   PTA, pt lived at home with husband, who assisted with ADL as needed. At baseline, pt was unable to ambulate distance to the bathroom,.  Therefore, pt has "bedroom/bathroom" set up in living room, where she primarily stays. Pt desats to 73 on 5L after taking 3 steps. Return to 88 after 3 min. Pt would benefit from rehab at SNF, however, pt/family decline SNF. Pt will benefit form HHOT and [place]. Discussed need for pt to mobilize primarily at w/c level at this time due to fatigue and high fall risk. Pt/husband verbalized understanding. Will follow acutely to address established goals and facilitate safe DC home.     Follow Up Recommendations  Supervision/Assistance - 24 hour;Home health OT (pt declining SNF)    Equipment Recommendations  None recommended by OT    Recommendations for Other Services       Precautions / Restrictions Precautions Precautions: Fall Precaution Comments: reports 1 fall in past 1 year Restrictions Weight Bearing Restrictions: No      Mobility Bed Mobility Overal bed mobility: Needs Assistance Bed Mobility: Rolling;Supine to Sit     Supine to sit: HOB elevated;Min guard        Transfers Overall transfer level: Needs assistance Equipment used: 1 person hand held assist Transfers: Sit to/from Stand Sit to Stand: Min assist         General transfer comment: multiple vc for sequencing and safe transfer techniquee. Husband verbalized understanding.    Balance Overall balance assessment: Needs assistance   Sitting balance-Leahy Scale: Fair       Standing balance-Leahy Scale: Poor                             ADL either performed or assessed  with clinical judgement   ADL Overall ADL's : Needs assistance/impaired Eating/Feeding: Set up   Grooming: Minimal assistance;Sitting   Upper Body Bathing: Moderate assistance;Sitting   Lower Body Bathing: Moderate assistance;Sit to/from stand   Upper Body Dressing : Minimal assistance;Sitting   Lower Body Dressing: Moderate assistance;Sit to/from stand   Toilet Transfer: Minimal assistance;RW;Stand-pivot   Toileting- Clothing Manipulation and Hygiene: Moderate assistance       Functional mobility during ADLs: Moderate assistance;Rolling walker;Cueing for safety;Cueing for sequencing General ADL Comments: Desats with minimal activity. educated husband on need for more w/c mobility due to desaturation. Pt/husband verbalized understanding.     Vision  "I need glasses"       Perception     Praxis      Pertinent Vitals/Pain Pain Assessment: No/denies pain Pain Score: 10-Worst pain ever Pain Location: everything hurts Pain Descriptors / Indicators: Aching Pain Intervention(s): Limited activity within patient's tolerance     Hand Dominance Right   Extremity/Trunk Assessment Upper Extremity Assessment Upper Extremity Assessment: Generalized weakness   Lower Extremity Assessment Lower Extremity Assessment: Generalized weakness   Cervical / Trunk Assessment Cervical / Trunk Assessment: Kyphotic   Communication Communication Communication: No difficulties   Cognition Arousal/Alertness: Awake/alert Behavior During Therapy: WFL for tasks assessed/performed Overall Cognitive Status: Impaired/Different from baseline Area of Impairment: Orientation;Attention;Memory;Safety/judgement;Awareness  Orientation Level: Disoriented to;Time Current Attention Level: Selective Memory: Decreased short-term memory   Safety/Judgement: Decreased awareness of safety;Decreased awareness of deficits Awareness: Emergent   General Comments: Instructed pt x 4 to  not pull up on RW Pt proceeded to try to pull up on RW.   General Comments       Exercises Exercises: Other exercises Other Exercises Other Exercises: pursed lip breathing   Shoulder Instructions      Home Living Family/patient expects to be discharged to:: Private residence Living Arrangements: Spouse/significant other Available Help at Discharge: Family;Available 24 hours/day Type of Home: House Home Access: Stairs to enter [employer] of Steps: 1   Home Layout: Multi-level Alternate Level Stairs-Number of Steps: 4-5 steps             Home Equipment: Wheelchair - manual;Bedside commode   Additional Comments: Pt sleeps in her recliner chair and toilets in her BSC. Sponge bathes. Has her living room set up as her bedroom/bathroom. Husband states pt does not go out in the community. "She has not been on the back porch in 9 yrs"      Prior Functioning/Environment Level of Independence: Needs assistance  Gait / Transfers Assistance Needed: min hand held assist to transfer from her recliner to [facility] at home from husband.  Assist to go into and out of house provided by husband.  Pt reports she would love to do more, but her breathing doesn't let her.  ADL's / Homemaking Assistance Needed: assist from husband.    Comments: Husband sets up wife for bathing/dressing adn then helps as needed        OT Problem List: Decreased strength;Decreased activity tolerance;Impaired balance (sitting and/or standing);Decreased safety awareness;Decreased knowledge of use of DME or AE;Decreased cognition;Cardiopulmonary status limiting activity;Obesity;Pain      OT Treatment/Interventions: Self-care/ADL training;Therapeutic exercise;Energy conservation;DME and/or AE instruction;Therapeutic activities;Cognitive remediation/compensation;Patient/family education;Balance training    OT Goals(Current goals can be found in the care plan section) Acute Rehab OT Goals Patient Stated Goal: to  get stronger OT Goal Formulation: With patient/family Time For Goal Achievement: 07/07/16 Potential to Achieve Goals: Good ADL Goals Pt Will Perform Upper Body Bathing: with set-up;sitting Pt Will Perform Lower Body Bathing: with min assist;sit to/from stand Pt Will Transfer to Toilet: with supervision;bedside commode;stand pivot transfer Additional ADL Goal #1: pt/husband will independently verbalize 3 strategies to reduce risk of falls.  OT Frequency: Min 2X/week   Barriers to D/C:            Co-evaluation              AM-PAC PT "6 Clicks" Daily Activity     Outcome Measure Help from another person eating meals?: None Help from another person taking care of personal grooming?: A Little Help from another person toileting, which includes using toliet, bedpan, or urinal?: A Lot Help from another person bathing (including washing, rinsing, drying)?: A Lot Help from another person to put on and taking off regular upper body clothing?: A Lot Help from another person to put on and taking off regular lower body clothing?: A Lot 6 Click Score: 15   End of Session Equipment Utilized During Treatment: Rolling walker;Oxygen (5L) Nurse Communication: Mobility status  Activity Tolerance: Patient tolerated treatment well Patient left: in bed;with call bell/phone within reach;with family/visitor present  OT Visit Diagnosis: Unsteadiness on feet (R26.81);Muscle weakness (generalized) (M62.81);Pain Pain - part of body:  (all over)  Time: 0934-1000 OT Time Calculation (min): 26 min Charges:  OT General Charges $OT Visit: 1 Procedure OT Evaluation $OT Eval Moderate Complexity: 1 Procedure OT Treatments $Self Care/Home Management : 8-22 mins G-Codes:     [facility], OT/L  [phone] 06/23/2016  Keri Tavella,HILLARY 06/23/2016, 12:44 PM

## 2016-06-23 NOTE — Progress Notes (Signed)
  Speech Language Pathology Treatment: Dysphagia  Patient Details Name: [name] MRN: [id number] DOB: [birth year] Today's Date: 06/23/2016 Time: 3584-4652 SLP Time Calculation (min) (ACUTE ONLY): 12 min  Assessment / Plan / Recommendation Clinical Impression  [name] appears brighter today, not nauseated. Significantly decreased expectoration of mucous reported and witnessed by this SLP She politely declined solid cracker texture. No overt s/s aspiration with straw sips thin. Reiterated taking rest breaks when needed during meals and explained increased risk of dyspnea with pt's having COPD pt's during po consumption. No further ST needed at this time.    HPI HPI: 61 year old female with PMH of COPD (4-5L [place] at baseline), MI, stroke, DM, CAD and medical noncompliance. On 5/9 presented to [name] with 2 day reported dyspnea and increased confusion. Found to have acute on chronic hypoxic/hypercapnic respiratory failure due to AECOPD. Per chart imagining revealed worsening mediastinal mass, husband states that wife knew of this however, kept putting off following up. Intubated 5/9-5/14. CXR Low lung volumes with basilar atelectasis. Mild bibasilar      SLP Plan  All goals met;Discharge SLP treatment due to (comment)       Recommendations  Diet recommendations: Regular;Thin liquid Liquids provided via: Cup;Straw Medication Administration: Whole meds with liquid Supervision: Patient able to self feed Compensations: Slow rate;Small sips/bites Postural Changes and/or Swallow Maneuvers: Seated upright 90 degrees;Upright 30-60 min after meal                Oral Care Recommendations: Oral care BID Follow up Recommendations: None SLP Visit Diagnosis: Dysphagia, pharyngeal phase (R13.13) Plan: All goals met;Discharge SLP treatment due to (comment)       GO                [name] 06/23/2016, 10:17 AM [name] [employer] [phone]

## 2016-06-23 NOTE — Progress Notes (Signed)
PT Cancellation Note  Patient Details Name: [name] MRN: [id number] DOB: 1955-06-03   Cancelled Treatment:    Reason Eval/Treat Not Completed: Other (comment) ("I am going home.")Husband present and states he can assist pt at home. They state that they have all equipment needed already.  Pt did not want to work with PT as she is leaving shortly. All questions answered.     [place] 06/23/2016, 2:26 PM  Amareon Phung,PT Acute Rehabilitation [phone] (pager)

## 2016-06-23 NOTE — Progress Notes (Addendum)
PCCM INTERVAL PROGRESS NOTE  Asked to assist with discharge planning by primary team with regards to follow up for COPD and lung mass.   Have discussed options with patient and husband who agree to proceed with discharge and pursue outpatient workup.   Would discharge on prednisone taper, combivent respimat 4 times daily, pulmicort neb twice daily  Needs nocturnal CPAP/BiPAP at but refuses.  Will arrange outpatient follow up for LUL mass.   Georgann Housekeeper, AGACNP-BC [facility] Pulmonology/Critical Care Pager [phone] or [phone]  06/23/2016 3:25 PM

## 2016-06-23 NOTE — Discharge Instructions (Signed)
[name],  You were in the hospital because of breathing issues. You were treated for a COPD exacerbation. Please continue your breathing treatments and steroids. You were also found to have a lung mass which has been present for some time. Please follow-up with the pulmonologist for this. Concern would be that this is cancer, as we discussed.

## 2016-06-23 NOTE — Progress Notes (Signed)
Pt discharged home. Pt and wife are refusing home health being set up for them.  Discharged instructions were reviewed with patient and husband. Pt & husband verbalized understanding.

## 2016-06-26 DIAGNOSIS — R918 Other nonspecific abnormal finding of lung field: Secondary | ICD-10-CM

## 2016-06-26 DIAGNOSIS — I5032 Chronic diastolic (congestive) heart failure: Secondary | ICD-10-CM

## 2016-06-28 ENCOUNTER — Inpatient Hospital Stay: Payer: BLUE CROSS/BLUE SHIELD | Admitting: Pulmonary Disease

## 2016-07-17 ENCOUNTER — Inpatient Hospital Stay: Payer: BLUE CROSS/BLUE SHIELD | Admitting: Acute Care

## 2016-09-07 ENCOUNTER — Telehealth: Payer: Self-pay | Admitting: Internal Medicine

## 2016-09-07 NOTE — Telephone Encounter (Signed)
Spoke with pt's husband, Pilar Plate. He is aware of MR's recommendation. Nothing further was needed.

## 2016-09-07 NOTE — Telephone Encounter (Signed)
Ok she at baseline is 5L [place] and I May had admission for AECOPD/sepsis and also CT at that time has worsening lung mass nearly 5cm and had fu with Dr Murlean Iba 06/28/16 but no showed (there is a note that says huusband told MD that patient keeps putting it off). I am worried now we are in august and this mass is worse and is causing symptoms. OR pneumonia. I think safe side she go to ER and get evaluated. Not sure antibiotic/pred will be sufficient.

## 2016-09-07 NOTE — Telephone Encounter (Signed)
Spoke with pt's husband. States that pt is having a COPD flare. Reports increased SOB, chest tightness, wheezing and coughing. Denies fever. Pt is wearing 5L of oxygen and still having issues with SOB. Symptoms started 1 week ago. Pt's husband would like to have prednisone and an antibiotic sent in.  MR - please advise. Thanks.

## 2016-09-13 ENCOUNTER — Inpatient Hospital Stay (HOSPITAL_COMMUNITY)
Admission: EM | Admit: 2016-09-13 | Discharge: 2016-09-19 | DRG: 189 | Disposition: A | Discharge status: Home / Self Care | Payer: BLUE CROSS/BLUE SHIELD | Attending: Internal Medicine | Admitting: Internal Medicine

## 2016-09-13 ENCOUNTER — Encounter (HOSPITAL_COMMUNITY): Payer: Self-pay | Admitting: Emergency Medicine

## 2016-09-13 ENCOUNTER — Emergency Department (HOSPITAL_COMMUNITY): Payer: BLUE CROSS/BLUE SHIELD

## 2016-09-13 DIAGNOSIS — I5032 Chronic diastolic (congestive) heart failure: Secondary | ICD-10-CM | POA: Diagnosis present

## 2016-09-13 DIAGNOSIS — E870 Hyperosmolality and hypernatremia: Secondary | ICD-10-CM | POA: Diagnosis present

## 2016-09-13 DIAGNOSIS — G9341 Metabolic encephalopathy: Secondary | ICD-10-CM | POA: Diagnosis present

## 2016-09-13 DIAGNOSIS — R918 Other nonspecific abnormal finding of lung field: Secondary | ICD-10-CM

## 2016-09-13 DIAGNOSIS — D638 Anemia in other chronic diseases classified elsewhere: Secondary | ICD-10-CM | POA: Diagnosis present

## 2016-09-13 DIAGNOSIS — K219 Gastro-esophageal reflux disease without esophagitis: Secondary | ICD-10-CM | POA: Diagnosis present

## 2016-09-13 DIAGNOSIS — N189 Chronic kidney disease, unspecified: Secondary | ICD-10-CM | POA: Diagnosis not present

## 2016-09-13 DIAGNOSIS — J9621 Acute and chronic respiratory failure with hypoxia: Secondary | ICD-10-CM | POA: Diagnosis present

## 2016-09-13 DIAGNOSIS — Z7901 Long term (current) use of anticoagulants: Secondary | ICD-10-CM | POA: Diagnosis not present

## 2016-09-13 DIAGNOSIS — I2510 Atherosclerotic heart disease of native coronary artery without angina pectoris: Secondary | ICD-10-CM | POA: Diagnosis present

## 2016-09-13 DIAGNOSIS — J440 Chronic obstructive pulmonary disease with acute lower respiratory infection: Secondary | ICD-10-CM | POA: Diagnosis present

## 2016-09-13 DIAGNOSIS — Z90710 Acquired absence of both cervix and uterus: Secondary | ICD-10-CM

## 2016-09-13 DIAGNOSIS — C3492 Malignant neoplasm of unspecified part of left bronchus or lung: Secondary | ICD-10-CM | POA: Diagnosis not present

## 2016-09-13 DIAGNOSIS — E119 Type 2 diabetes mellitus without complications: Secondary | ICD-10-CM | POA: Diagnosis not present

## 2016-09-13 DIAGNOSIS — I252 Old myocardial infarction: Secondary | ICD-10-CM

## 2016-09-13 DIAGNOSIS — Z9889 Other specified postprocedural states: Secondary | ICD-10-CM

## 2016-09-13 DIAGNOSIS — Z515 Encounter for palliative care: Secondary | ICD-10-CM

## 2016-09-13 DIAGNOSIS — Z79899 Other long term (current) drug therapy: Secondary | ICD-10-CM | POA: Diagnosis not present

## 2016-09-13 DIAGNOSIS — J9622 Acute and chronic respiratory failure with hypercapnia: Secondary | ICD-10-CM

## 2016-09-13 DIAGNOSIS — J441 Chronic obstructive pulmonary disease with (acute) exacerbation: Secondary | ICD-10-CM | POA: Diagnosis present

## 2016-09-13 DIAGNOSIS — E8729 Other acidosis: Secondary | ICD-10-CM

## 2016-09-13 DIAGNOSIS — Y95 Nosocomial condition: Secondary | ICD-10-CM | POA: Diagnosis present

## 2016-09-13 DIAGNOSIS — Z951 Presence of aortocoronary bypass graft: Secondary | ICD-10-CM | POA: Diagnosis not present

## 2016-09-13 DIAGNOSIS — F419 Anxiety disorder, unspecified: Secondary | ICD-10-CM | POA: Diagnosis present

## 2016-09-13 DIAGNOSIS — Z881 Allergy status to other antibiotic agents status: Secondary | ICD-10-CM | POA: Diagnosis not present

## 2016-09-13 DIAGNOSIS — N390 Urinary tract infection, site not specified: Secondary | ICD-10-CM | POA: Diagnosis present

## 2016-09-13 DIAGNOSIS — Z9119 Patient's noncompliance with other medical treatment and regimen: Secondary | ICD-10-CM

## 2016-09-13 DIAGNOSIS — E118 Type 2 diabetes mellitus with unspecified complications: Secondary | ICD-10-CM | POA: Diagnosis not present

## 2016-09-13 DIAGNOSIS — B372 Candidiasis of skin and nail: Secondary | ICD-10-CM | POA: Diagnosis present

## 2016-09-13 DIAGNOSIS — I714 Abdominal aortic aneurysm, without rupture: Secondary | ICD-10-CM | POA: Diagnosis present

## 2016-09-13 DIAGNOSIS — I2729 Other secondary pulmonary hypertension: Secondary | ICD-10-CM | POA: Diagnosis present

## 2016-09-13 DIAGNOSIS — Z8673 Personal history of transient ischemic attack (TIA), and cerebral infarction without residual deficits: Secondary | ICD-10-CM

## 2016-09-13 DIAGNOSIS — Z794 Long term (current) use of insulin: Secondary | ICD-10-CM

## 2016-09-13 DIAGNOSIS — Z66 Do not resuscitate: Secondary | ICD-10-CM | POA: Diagnosis present

## 2016-09-13 DIAGNOSIS — Z87891 Personal history of nicotine dependence: Secondary | ICD-10-CM | POA: Diagnosis not present

## 2016-09-13 DIAGNOSIS — I4891 Unspecified atrial fibrillation: Secondary | ICD-10-CM | POA: Diagnosis present

## 2016-09-13 DIAGNOSIS — E872 Acidosis: Secondary | ICD-10-CM | POA: Diagnosis present

## 2016-09-13 DIAGNOSIS — C3490 Malignant neoplasm of unspecified part of unspecified bronchus or lung: Secondary | ICD-10-CM

## 2016-09-13 DIAGNOSIS — B9620 Unspecified Escherichia coli [E. coli] as the cause of diseases classified elsewhere: Secondary | ICD-10-CM | POA: Diagnosis present

## 2016-09-13 DIAGNOSIS — D631 Anemia in chronic kidney disease: Secondary | ICD-10-CM | POA: Diagnosis not present

## 2016-09-13 DIAGNOSIS — Z9981 Dependence on supplemental oxygen: Secondary | ICD-10-CM

## 2016-09-13 DIAGNOSIS — I272 Pulmonary hypertension, unspecified: Secondary | ICD-10-CM | POA: Diagnosis not present

## 2016-09-13 DIAGNOSIS — R0602 Shortness of breath: Secondary | ICD-10-CM | POA: Diagnosis present

## 2016-09-13 DIAGNOSIS — J189 Pneumonia, unspecified organism: Secondary | ICD-10-CM | POA: Diagnosis present

## 2016-09-13 HISTORY — DX: Other nonspecific abnormal finding of lung field: R91.8

## 2016-09-13 HISTORY — DX: Heart failure, unspecified: I50.9

## 2016-09-13 HISTORY — DX: Acute and chronic respiratory failure with hypoxia: J96.21

## 2016-09-13 LAB — COMPREHENSIVE METABOLIC PANEL
ALBUMIN: 2.7 g/dL — AB (ref 3.5–5.0)
ALT: 13 U/L — ABNORMAL LOW (ref 14–54)
ANION GAP: 7 (ref 5–15)
AST: 13 U/L — ABNORMAL LOW (ref 15–41)
Alkaline Phosphatase: 76 U/L (ref 38–126)
BUN: 13 mg/dL (ref 6–20)
CHLORIDE: 87 mmol/L — AB (ref 101–111)
CO2: 47 mmol/L — AB (ref 22–32)
Calcium: 9.3 mg/dL (ref 8.9–10.3)
Creatinine, Ser: 0.84 mg/dL (ref 0.44–1.00)
GFR calc Af Amer: >60 mL/min (ref >60)
GFR calc non Af Amer: >60 mL/min (ref >60)
Glucose, Bld: 181 mg/dL — ABNORMAL HIGH (ref 65–99)
POTASSIUM: 5.2 mmol/L — AB (ref 3.5–5.1)
SODIUM: 141 mmol/L (ref 135–145)
Total Bilirubin: 0.6 mg/dL (ref 0.3–1.2)
Total Protein: 7.8 g/dL (ref 6.5–8.1)

## 2016-09-13 LAB — BLOOD GAS, ARTERIAL
ACID-BASE EXCESS: 21.7 mmol/L — AB (ref 0.0–2.0)
ACID-BASE EXCESS: 20.4 mmol/L — AB (ref 0.0–2.0)
ACID-BASE EXCESS: 26.2 mmol/L — AB (ref 0.0–2.0)
Acid-Base Excess: 24.6 mmol/L — ABNORMAL HIGH (ref 0.0–2.0)
BICARBONATE: 47.2 mmol/L — AB (ref 20.0–28.0)
BICARBONATE: 52.9 mmol/L — AB (ref 20.0–28.0)
Bicarbonate: 49.6 mmol/L — ABNORMAL HIGH (ref 20.0–28.0)
Bicarbonate: 52 mmol/L — ABNORMAL HIGH (ref 20.0–28.0)
DELIVERY SYSTEMS: POSITIVE
DELIVERY SYSTEMS: POSITIVE
DRAWN BY: 441371
Delivery systems: POSITIVE
Delivery systems: POSITIVE
Drawn by: 441351
Drawn by: 404151
Drawn by: 441371
EXPIRATORY PAP: 5
EXPIRATORY PAP: 5
EXPIRATORY PAP: 5
Expiratory PAP: 5
FIO2: 60
FIO2: 40
FIO2: 60
FIO2: 60
INSPIRATORY PAP: 20
INSPIRATORY PAP: 20
Inspiratory PAP: 20
Inspiratory PAP: 20
LHR: 10 {breaths}/min
LHR: 8 {breaths}/min
LHR: 8 {breaths}/min
MODE: POSITIVE
Mode: POSITIVE
O2 SAT: 95.1 %
O2 SAT: 99 %
O2 SAT: 96 %
O2 Saturation: 96.8 %
PATIENT TEMPERATURE: 99.9
PCO2 ART: 104 mmHg — AB (ref 32.0–48.0)
PCO2 ART: 85.6 mmHg — AB (ref 32.0–48.0)
PH ART: 7.259 — AB (ref 7.350–7.450)
PO2 ART: 84 mmHg (ref 83.0–108.0)
Patient temperature: 98.6
Patient temperature: 98.6
Patient temperature: 98.9
pCO2 arterial: 115 mmHg (ref 32.0–48.0)
pCO2 arterial: 88.9 mmHg (ref 32.0–48.0)
pH, Arterial: 7.345 — ABNORMAL LOW (ref 7.350–7.450)
pH, Arterial: 7.322 — ABNORMAL LOW (ref 7.350–7.450)
pH, Arterial: 7.411 (ref 7.350–7.450)
pO2, Arterial: 78.7 mmHg — ABNORMAL LOW (ref 83.0–108.0)
pO2, Arterial: 126 mmHg — ABNORMAL HIGH (ref 83.0–108.0)
pO2, Arterial: 87.1 mmHg (ref 83.0–108.0)

## 2016-09-13 LAB — URINALYSIS, ROUTINE W REFLEX MICROSCOPIC
BILIRUBIN URINE: NEGATIVE
GLUCOSE, UA: NEGATIVE mg/dL
Ketones, ur: NEGATIVE mg/dL
Nitrite: NEGATIVE
PROTEIN: NEGATIVE mg/dL
Specific Gravity, Urine: 1.008 (ref 1.005–1.030)
pH: 5 (ref 5.0–8.0)

## 2016-09-13 LAB — CBC WITH DIFFERENTIAL/PLATELET
BASOS PCT: 0 %
Basophils Absolute: 0 10*3/uL (ref 0.0–0.1)
EOS ABS: 0 10*3/uL (ref 0.0–0.7)
EOS PCT: 0 %
HCT: 44.3 % (ref 36.0–46.0)
HEMOGLOBIN: 11.8 g/dL — AB (ref 12.0–15.0)
Lymphocytes Relative: 8 %
Lymphs Abs: 1 10*3/uL (ref 0.7–4.0)
MCH: 26.9 pg (ref 26.0–34.0)
MCHC: 26.6 g/dL — AB (ref 30.0–36.0)
MCV: 101.1 fL — ABNORMAL HIGH (ref 78.0–100.0)
MONO ABS: 0.9 10*3/uL (ref 0.1–1.0)
MONOS PCT: 7 %
NEUTROS PCT: 85 %
Neutro Abs: 10.5 10*3/uL — ABNORMAL HIGH (ref 1.7–7.7)
PLATELETS: 271 10*3/uL (ref 150–400)
RBC: 4.38 MIL/uL (ref 3.87–5.11)
RDW: 14.9 % (ref 11.5–15.5)
WBC: 12.4 10*3/uL — ABNORMAL HIGH (ref 4.0–10.5)

## 2016-09-13 LAB — HIV ANTIBODY (ROUTINE TESTING W REFLEX): HIV Screen 4th Generation wRfx: NONREACTIVE

## 2016-09-13 LAB — GLUCOSE, CAPILLARY
GLUCOSE-CAPILLARY: 167 mg/dL — AB (ref 65–99)
GLUCOSE-CAPILLARY: 83 mg/dL (ref 65–99)
GLUCOSE-CAPILLARY: 92 mg/dL (ref 65–99)
Glucose-Capillary: 187 mg/dL — ABNORMAL HIGH (ref 65–99)
Glucose-Capillary: 110 mg/dL — ABNORMAL HIGH (ref 65–99)

## 2016-09-13 LAB — STREP PNEUMONIAE URINARY ANTIGEN: Strep Pneumo Urinary Antigen: NEGATIVE

## 2016-09-13 LAB — PROCALCITONIN: Procalcitonin: 0.1 ng/mL

## 2016-09-13 LAB — I-STAT TROPONIN, ED: TROPONIN I, POC: 0 ng/mL (ref 0.00–0.08)

## 2016-09-13 LAB — MRSA PCR SCREENING: MRSA by PCR: NEGATIVE

## 2016-09-13 LAB — BRAIN NATRIURETIC PEPTIDE: B Natriuretic Peptide: 815.1 pg/mL — ABNORMAL HIGH (ref 0.0–100.0)

## 2016-09-13 MED ORDER — INSULIN ASPART 100 UNIT/ML ~~LOC~~ SOLN
0.0000 [IU] | SUBCUTANEOUS | Status: DC
Start: 2016-09-13 — End: 2016-09-17
  Administered 2016-09-13 – 2016-09-15 (×6): 2 [IU] via SUBCUTANEOUS
  Administered 2016-09-15: 3 [IU] via SUBCUTANEOUS
  Administered 2016-09-15: 2 [IU] via SUBCUTANEOUS
  Administered 2016-09-15: 3 [IU] via SUBCUTANEOUS
  Administered 2016-09-16: 2 [IU] via SUBCUTANEOUS
  Administered 2016-09-16 (×2): 1 [IU] via SUBCUTANEOUS
  Administered 2016-09-16: 2 [IU] via SUBCUTANEOUS
  Administered 2016-09-16 – 2016-09-17 (×2): 1 [IU] via SUBCUTANEOUS
  Administered 2016-09-17: 2 [IU] via SUBCUTANEOUS
  Administered 2016-09-17: 5 [IU] via SUBCUTANEOUS
  Administered 2016-09-17: 3 [IU] via SUBCUTANEOUS
  Administered 2016-09-17: 2 [IU] via SUBCUTANEOUS

## 2016-09-13 MED ORDER — METHYLPREDNISOLONE SODIUM SUCC 125 MG IJ SOLR
60.0000 mg | Freq: Four times a day (QID) | INTRAMUSCULAR | Status: DC
  Administered 2016-09-13 – 2016-09-15 (×8): 60 mg via INTRAVENOUS
  Filled 2016-09-13 (×7): qty 0.96
  Filled 2016-09-13: qty 2
  Filled 2016-09-13: qty 0.96

## 2016-09-13 MED ORDER — FUROSEMIDE 10 MG/ML IJ SOLN
40.0000 mg | Freq: Once | INTRAMUSCULAR | Status: AC
  Administered 2016-09-13: 40 mg via INTRAVENOUS
  Filled 2016-09-13: qty 4

## 2016-09-13 MED ORDER — METOPROLOL TARTRATE 5 MG/5ML IV SOLN: 5.0000 mg | INTRAVENOUS | Status: DC | PRN

## 2016-09-13 MED ORDER — ONDANSETRON HCL 4 MG/2ML IJ SOLN: 4.0000 mg | Freq: Three times a day (TID) | INTRAMUSCULAR | Status: DC | PRN

## 2016-09-13 MED ORDER — LORAZEPAM 2 MG/ML IJ SOLN
0.5000 mg | Freq: Once | INTRAMUSCULAR | Status: AC
  Administered 2016-09-13: 0.5 mg via INTRAVENOUS

## 2016-09-13 MED ORDER — CHLORHEXIDINE GLUCONATE 0.12 % MT SOLN
15.0000 mL | Freq: Two times a day (BID) | OROMUCOSAL | Status: DC
  Administered 2016-09-14 – 2016-09-18 (×8): 15 mL via OROMUCOSAL
  Filled 2016-09-13 (×2): qty 15

## 2016-09-13 MED ORDER — LEVALBUTEROL HCL 1.25 MG/0.5ML IN NEBU
1.2500 mg | INHALATION_SOLUTION | Freq: Four times a day (QID) | RESPIRATORY_TRACT | Status: DC
  Administered 2016-09-13 – 2016-09-15 (×8): 1.25 mg via RESPIRATORY_TRACT
  Filled 2016-09-13 (×11): qty 0.5

## 2016-09-13 MED ORDER — VANCOMYCIN HCL 10 G IV SOLR
1250.0000 mg | Freq: Two times a day (BID) | INTRAVENOUS | Status: DC
  Administered 2016-09-13 – 2016-09-15 (×4): 1250 mg via INTRAVENOUS
  Filled 2016-09-13 (×4): qty 1250

## 2016-09-13 MED ORDER — ORAL CARE MOUTH RINSE
15.0000 mL | Freq: Two times a day (BID) | OROMUCOSAL | Status: DC
  Administered 2016-09-14 – 2016-09-19 (×7): 15 mL via OROMUCOSAL

## 2016-09-13 MED ORDER — IPRATROPIUM BROMIDE 0.02 % IN SOLN
0.5000 mg | RESPIRATORY_TRACT | Status: DC
  Administered 2016-09-13 (×2): 0.5 mg via RESPIRATORY_TRACT
  Filled 2016-09-13 (×2): qty 2.5

## 2016-09-13 MED ORDER — ENOXAPARIN SODIUM 100 MG/ML ~~LOC~~ SOLN
1.0000 mg/kg | Freq: Two times a day (BID) | SUBCUTANEOUS | Status: DC
  Administered 2016-09-13 – 2016-09-14 (×3): 95 mg via SUBCUTANEOUS
  Filled 2016-09-13 (×3): qty 0.95

## 2016-09-13 MED ORDER — LORAZEPAM 2 MG/ML IJ SOLN
2.0000 mg | INTRAMUSCULAR | Status: DC | PRN
  Administered 2016-09-13 – 2016-09-14 (×3): 2 mg via INTRAVENOUS
  Filled 2016-09-13 (×3): qty 1

## 2016-09-13 MED ORDER — LORAZEPAM 2 MG/ML IJ SOLN
INTRAMUSCULAR | Status: AC
  Filled 2016-09-13: qty 1

## 2016-09-13 MED ORDER — LORAZEPAM 2 MG/ML IJ SOLN
INTRAMUSCULAR | Status: AC
  Administered 2016-09-13: 2 mg
  Filled 2016-09-13: qty 1

## 2016-09-13 MED ORDER — INSULIN GLARGINE 100 UNIT/ML ~~LOC~~ SOLN
20.0000 [IU] | Freq: Every day | SUBCUTANEOUS | Status: DC
  Administered 2016-09-13 – 2016-09-14 (×2): 20 [IU] via SUBCUTANEOUS
  Filled 2016-09-13 (×3): qty 0.2

## 2016-09-13 MED ORDER — DEXTROSE 5 % IV SOLN
1.0000 g | Freq: Three times a day (TID) | INTRAVENOUS | Status: AC
  Administered 2016-09-13 – 2016-09-18 (×18): 1 g via INTRAVENOUS
  Filled 2016-09-13 (×18): qty 1

## 2016-09-13 MED ORDER — SODIUM CHLORIDE 0.9% FLUSH
3.0000 mL | Freq: Two times a day (BID) | INTRAVENOUS | Status: DC
  Administered 2016-09-13 – 2016-09-19 (×7): 3 mL via INTRAVENOUS

## 2016-09-13 MED ORDER — HYDRALAZINE HCL 20 MG/ML IJ SOLN
5.0000 mg | INTRAMUSCULAR | Status: DC | PRN
  Filled 2016-09-13 (×2): qty 1

## 2016-09-13 MED ORDER — SODIUM CHLORIDE 0.9 % IV SOLN
0.2000 ug/kg/h | INTRAVENOUS | Status: DC
  Administered 2016-09-13: 0.6 ug/kg/h via INTRAVENOUS
  Administered 2016-09-13: 0.2 ug/kg/h via INTRAVENOUS
  Administered 2016-09-14: 0.7 ug/kg/h via INTRAVENOUS
  Administered 2016-09-14 (×2): 0.2 ug/kg/h via INTRAVENOUS
  Filled 2016-09-13 (×5): qty 2

## 2016-09-13 MED ORDER — METHYLPREDNISOLONE SODIUM SUCC 125 MG IJ SOLR
125.0000 mg | Freq: Once | INTRAMUSCULAR | Status: AC
  Administered 2016-09-13: 125 mg via INTRAVENOUS
  Filled 2016-09-13: qty 2

## 2016-09-13 MED ORDER — MAGNESIUM SULFATE 2 GM/50ML IV SOLN
2.0000 g | Freq: Once | INTRAVENOUS | Status: AC
Start: 2016-09-13 — End: 2016-09-13
  Administered 2016-09-13: 2 g via INTRAVENOUS
  Filled 2016-09-13: qty 50

## 2016-09-13 MED ORDER — HYDRALAZINE HCL 20 MG/ML IJ SOLN: 5.0000 mg | Freq: Four times a day (QID) | INTRAMUSCULAR | Status: DC | PRN

## 2016-09-13 MED ORDER — IPRATROPIUM BROMIDE 0.02 % IN SOLN
0.5000 mg | Freq: Three times a day (TID) | RESPIRATORY_TRACT | Status: DC
  Administered 2016-09-13 – 2016-09-14 (×2): 0.5 mg via RESPIRATORY_TRACT
  Filled 2016-09-13 (×2): qty 2.5

## 2016-09-13 MED ORDER — SODIUM CHLORIDE 0.9 % IV SOLN
1500.0000 mg | Freq: Once | INTRAVENOUS | Status: AC
  Administered 2016-09-13: 1500 mg via INTRAVENOUS
  Filled 2016-09-13: qty 1500

## 2016-09-13 MED ORDER — NYSTATIN 100000 UNIT/GM EX POWD
Freq: Three times a day (TID) | CUTANEOUS | Status: DC
  Administered 2016-09-13 – 2016-09-15 (×7): via TOPICAL
  Administered 2016-09-15: 1 via TOPICAL
  Administered 2016-09-15 – 2016-09-19 (×11): via TOPICAL
  Filled 2016-09-13 (×2): qty 15

## 2016-09-13 NOTE — H&P (Signed)
History and Physical    [name] QVZ:[id number] DOB: January 12, 1956 DOA: 09/13/2016   PCP: Brand Males, MD   Attending physician: Marily Memos  Patient coming from/Resides with: Private residence/husband  Chief Complaint: Shortness of breath  HPI: [name] is a 61 y.o. female with medical history significant for chronic respiratory failure with hypoxemia and hypercapnia with baseline O2 at 5 L/m, chronic diastolic congestive heart failure, CAD and history of prior MI, history of stroke, diabetes on insulin, arthritis and anemia of chronic disease. Patient was hospitalized in May and discharged on 5/18 after an admission for acute respiratory failure that required intubation for 5 days. She underwent a CT of the chest that admission that demonstrated a 4.7 x 4.5 cm lung mass in the central left upper lobe that was abutting the mediastinal pleura and left upper hilum that has increased in size since October thousand 17 x-rays compatible with primary bronchogenic carcinoma. There were also right paratracheal lymphadenopathy suspicious for nodal metastasis. On previous admission she had postobstructive pneumonia and was treated accordingly. She was instructed once again to follow up with her pulmonologist at discharge regarding evaluation and treatment of the lung mass. In review of outpatient documentation, a phone call was made to the pulmonologist office. Patient had an appointment scheduled for 5/23 but did not show up. Office apparently contacted patient's home and husband told [name] that the patient keeps putting off evaluation of the mass. Apparently she called the office on 8/2 which is sedated at this note reporting worsening respiratory symptoms. Patient called EMS to her home overnight reporting shortness of breath with increased work of breathing O2 sats <50% on room air and increased to 32% after application of CPAP. She was given DuoNeb nebs and racemic epi prior to transport. Upon arrival  to the ER patient demonstrated symptomatic hypercarbic as well as hypoxemic respiratory failure based on ABG. After treatment with BiPAP repeat ABG showed improvement in pH, PCO2 and PO2. Of note a portable chest x-ray obtained here devastated the mass has significantly increased in size and is now demonstrated at 6.4 cm.  Patient has subsequently been transferred to the stepdown unit. She is now rather anxious, restless and agitated and tremulous. She is having mild clonic jerking. She is somewhat confused. Symptoms improved somewhat after administration of Ativan 0.5 mg 2 separate doses. Attempt was made to obtain a third ABG but was unsuccessful. She remains tachycardic with systolic blood pressure between 95 and 115. Orders given to give Xopenex neb, Solu-Medrol IV and place Foley catheter. PCCM has been urgently contacted regarding not only need for pulmonary consultation regarding evaluation of pulmonary mass that concerns that patient may eventually require intubation this admission.  ED Course:  Vital Signs: BP (!) 110/58   Pulse (!) 115   Temp (!) 96.7 F (35.9 C) (Temporal)   Resp (!) 25   Wt 94.8 kg (209 lb)   SpO2 98%   BMI 33.73 kg/m  PCXR: Large 6.4 cm mass at the left hilum, vascular congestion and mild cardiomegaly, bibasilar atelectasis Lab data: Sodium 141, potassium 5.2, chloride 87, CO2 47, glucose 181, BUN 13, creatinine 0.84, albumin 2.7, LFTs not elevated, white count 12,400 with neutrophils 85% and absolute neutrophils 10.5%, hemoglobin 11.8, MCV 101.1, platelets 271,000, BNP 815, poc troponin 0.00 ABG #1: PH 7.259, PCO2 1:15, PO2 78.7, ABE 21.7, bicarbonate 49.6 ABG #2: PH 7.345, PCO2 88.9, PO2 126, ABE 20.4, bicarbonate 47.2 Medications and treatments: Solu-Medrol 125 IV 1, Lasix 40 mg IV 1,  BiPAP  Review of Systems:  **Unable to obtain adequate history from patient given underlying agitation and confusion-no family available at time of my evaluation-history obtained  from the chart   Past Medical History:  Diagnosis Date  . Acute MI ([place])   . Arthritis   . CHF (congestive heart failure) ([place])   . COPD (chronic obstructive pulmonary disease) ([place])    home O2  . Coronary artery disease   . Diabetes mellitus without complication ([place])   . Shortness of breath   . Stroke [facility]) TIA     Past Surgical History:  Procedure Laterality Date  . ABDOMINAL HYSTERECTOMY    . CARDIAC CATHETERIZATION    . CORONARY ARTERY BYPASS GRAFT     2003    Social History   Social History  . Marital status: Single    Spouse name: N/A  . Number of children: N/A  . Years of education: N/A   Occupational History  . Not on file.   Social History Main Topics  . Smoking status: Former Smoker    Packs/day: 3.00    Years: 40.00    Types: Cigarettes    Quit date: 02/06/2009  . Smokeless tobacco: Never Used  . Alcohol use No  . Drug use: Unknown  . Sexual activity: Not Currently   Other Topics Concern  . Not on file   Social History Narrative  . No narrative on file    Mobility: ?? RW Work history: Not obtained   Allergies  Allergen Reactions  . Ciprofloxacin Rash    REACTION: hives/rash    Family History  Problem Relation Age of Onset  . Lung cancer Brother   . Emphysema Brother   . COPD Sister   . Heart attack Sister   . Diabetes Brother   . Lung cancer Brother   . Emphysema Sister      Prior to Admission medications   Medication Sig Start Date End Date Taking? Authorizing Provider  atorvastatin (LIPITOR) 20 MG tablet Take 1 tablet (20 mg total) by mouth daily. 02/25/13  Yes Elsie Stain, MD  gabapentin (NEURONTIN) 100 MG capsule Take 100 mg by mouth 3 (three) times daily. 03/18/15 06/14/17 Yes [provider]  HYDROcodone-acetaminophen (NORCO/VICODIN) 5-325 MG tablet Take 1-2 tablets by mouth every 6 (six) hours as needed for moderate pain. 06/23/16  Yes Mariel Aloe, MD  insulin aspart (NOVOLOG FLEXPEN) 100 UNIT/ML SOPN  FlexPen Inject 6 Units into the skin 3 (three) times daily with meals. Patient taking differently: Inject 20 Units into the skin 2 (two) times daily.  12/18/12  Yes Gherghe, Vella Redhead, MD  Insulin Degludec (TRESIBA FLEXTOUCH) 100 UNIT/ML SOPN Inject 20 Units into the skin daily.   Yes [provider]  Ipratropium-Albuterol (COMBIVENT RESPIMAT) 20-100 MCG/ACT AERS respimat Inhale 1-2 puffs into the lungs every 4 (four) hours as needed for wheezing. 02/25/13  Yes Elsie Stain, MD  ipratropium-albuterol (DUONEB) 0.5-2.5 (3) MG/3ML SOLN Take 3 mLs by nebulization every 4 (four) hours as needed for shortness of breath or wheezing. 06/13/16  Yes [provider]  isosorbide mononitrate (IMDUR) 60 MG 24 hr tablet Take 60 mg by mouth daily.   Yes [provider]  metoprolol tartrate (LOPRESSOR) 50 MG tablet Take 1 tablet (50 mg total) by mouth 2 (two) times daily. Patient taking differently: Take 25 mg by mouth daily.  05/27/15  Yes Vann, Jessica U, DO  predniSONE (DELTASONE) 20 MG tablet Take 20mg  for 3 days, then  take 10 mg for 4 days Patient taking differently: Take 20 mg by mouth daily.  06/24/16  Yes Mariel Aloe, MD  Rivaroxaban (XARELTO) 20 MG TABS tablet Take 1 tablet (20 mg total) by mouth daily. 02/25/13  Yes Elsie Stain, MD    Physical Exam: Vitals:   09/13/16 0717 09/13/16 0800 09/13/16 0831 09/13/16 0832  BP: (!) 110/58     Pulse:   (!) 115   Resp:   (!) 25   Temp:      TempSrc:      SpO2:   99% 98%  Weight:  94.8 kg (209 lb)        Constitutional: Anxious and agitated, looks chronically ill and older than stated age Eyes: PERRL, lids and conjunctivae normal-sclera injected bilaterally ENMT: Mucous membranes are dry. Unable to examine posterior pharynx secondary to BiPAP mask placement. Poor dentition.  Neck: normal, supple, no masses, no thyromegaly Respiratory: Patient presents with markedly diminished bilateral lung sounds more diminished on  left than right with a few respiratory crackles but no wheezing. Tachypnea which has improved with Ativan. Abormal respiratory effort with accessory muscle use which has also improved slightly with the administration of Ativan. Requiring BiPAP. O2 sats ration 98% in setting of increased work of breathing. Cardiovascular: Regular but tachycardic rate and rhythm, no murmurs / rubs / gallops. No extremity edema. 2+ pedal pulses. No carotid bruits.  Abdomen: no tenderness, no masses palpated. No hepatosplenomegaly. Bowel sounds positive.  Musculoskeletal: no clubbing / cyanosis. No joint deformity upper and lower extremities. Good ROM, no contractures. Normal muscle tone.  Skin: no rashes, lesions, ulcers. No induration Neurologic: CN 2-12 grossly intact. Sensation intact, DTR normal. Strength 5/5 x all 4 extremities. Mild recurrent clonic activity. Psychiatric: Abnormal judgment and insight. Alert and oriented x name only. Anxious mood.    Labs on Admission: I have personally reviewed following labs and imaging studies  CBC:  Recent Labs Lab 09/13/16 0248  WBC 12.4*  NEUTROABS 10.5*  HGB 11.8*  HCT 44.3  MCV 101.1*  PLT 366   Basic Metabolic Panel:  Recent Labs Lab 09/13/16 0248  NA 141  K 5.2*  CL 87*  CO2 47*  GLUCOSE 181*  BUN 13  CREATININE 0.84  CALCIUM 9.3   GFR: Estimated Creatinine Clearance: 80.6 mL/min (by C-G formula based on SCr of 0.84 mg/dL). Liver Function Tests:  Recent Labs Lab 09/13/16 0248  AST 13*  ALT 13*  ALKPHOS 76  BILITOT 0.6  PROT 7.8  ALBUMIN 2.7*   No results for input(s): LIPASE, AMYLASE in the last 168 hours. No results for input(s): AMMONIA in the last 168 hours. Coagulation Profile: No results for input(s): INR, PROTIME in the last 168 hours. Cardiac Enzymes: No results for input(s): CKTOTAL, CKMB, CKMBINDEX, TROPONINI in the last 168 hours. BNP (last 3 results) No results for input(s): PROBNP in the last 8760 hours. HbA1C: No  results for input(s): HGBA1C in the last 72 hours. CBG: No results for input(s): GLUCAP in the last 168 hours. Lipid Profile: No results for input(s): CHOL, HDL, LDLCALC, TRIG, CHOLHDL, LDLDIRECT in the last 72 hours. Thyroid Function Tests: No results for input(s): TSH, T4TOTAL, FREET4, T3FREE, THYROIDAB in the last 72 hours. Anemia Panel: No results for input(s): VITAMINB12, FOLATE, FERRITIN, TIBC, IRON, RETICCTPCT in the last 72 hours. Urine analysis:    Component Value Date/Time   COLORURINE YELLOW 06/14/2016 [place] 06/14/2016 1407   LABSPEC 1.016 06/14/2016 1407  PHURINE 6.0 06/14/2016 1407   GLUCOSEU NEGATIVE 06/14/2016 1407   [place] 06/14/2016 [place] 06/14/2016 1407   KETONESUR NEGATIVE 06/14/2016 1407   PROTEINUR 30 (A) 06/14/2016 1407   NITRITE NEGATIVE 06/14/2016 1407   LEUKOCYTESUR NEGATIVE 06/14/2016 1407   Sepsis Labs: @LABRCNTIP (procalcitonin:4,lacticidven:4) )No results found for this or any previous visit (from the past 240 hour(s)).   Radiological Exams on Admission: Dg Chest Portable 1 View  Result Date: 09/13/2016 CLINICAL DATA:  Acute onset of respiratory distress. Initial encounter. EXAM: PORTABLE CHEST 1 VIEW COMPARISON:  CT of the chest performed 06/14/2016, and chest radiograph performed 06/21/2016 FINDINGS: The lungs are well-aerated. A large 6.4 cm mass is noted at the left hilum. Vascular congestion is noted. Bibasilar atelectasis is noted. No definite pleural effusion or pneumothorax is seen. The cardiomediastinal silhouette is mildly enlarged. The patient is status post median sternotomy. No acute osseous abnormalities are seen. IMPRESSION: 1. Large 6.4 cm mass at the left hilum. 2. Vascular congestion and mild cardiomegaly. 3. Bibasilar atelectasis noted. Electronically Signed   By: Garald Balding [name]   On: 09/13/2016 03:08    EKG: (Independently reviewed) sinus tachycardia with ventricular rate 105 bpm,  QTC 455 ms, significant amount of artifact especially in inferoseptal leads but no definitive acute ischemic changes.  Assessment/Plan Principal Problem:   Acute on chronic respiratory failure with hypoxia and hypercapnia 2/2 Mass of left lung and COPD exacerbation  -Patient presents with acute on chronic hypoxemic and hypercapnic respiratory failure in setting of known COPD as well as enlarging left lung mass -Suspect enlarging lung mass (likely malignancy) primary etiology to patient's symptoms and likely has a postobstructive pneumonia process -Broad spectrum empiric antibiotics to cover HCAP -Blood cultures and sputum culture  -Procalcitonin, urinary strep -Xopenex nebs and IV Solu-Medrol -Continue BiPAP but low threshold for transfer to ICU and anticipate patient will require intubation this admission -PCCM consultation regarding additional evaluation of lung mass and evaluation for possible intubation **Third ABG finally obtained with worsening hypercarbia with PCO2 back up to 104 with pH dropping to 7.33 and PO2 84. Dr. Wonda Amis w/ PCCM at bedside and patient to be transferred to ICU to be monitored for possible for intubation  Active Problems:   Acute metabolic encephalopathy -Secondary to problem #1 -Treat underlying causes -Obtain urinalysis and culture to rule out occult UTI- UA c/w UTI- already on broad spectrum empiric anbx's    Diabetes mellitus without complication  -Continue preadmission Tresiba -SSI -HgbA1c was 6.3 on 5/10    Chronic diastolic heart failure  -Likely had a mild degree of acute exacerbation secondary to acute respiratory failure as documented above noting patient had a degree of vascular congestion and a mild elevated BNP around 800 at presentation -She was given Lasix 40 mg IV 1 in ER and blood pressure history to the 90s so additional doses deferred    History of CAD -Convert home beta blocker to IV -Hold Imdur and statin since NPO    History of  CVA/TIA -On chronic Xarelto -Since NPO we'll utilize full dose Lovenox with pharmacy assisting with dosing    Candidal dermatitis -Topical nystatin powder    Anemia, chronic disease -Hgb stable around 11      DVT prophylaxis: Full dose Lovenox Code Status: Full Family Communication: No family at bedside at time of my evaluation that since that time has been has arrived to bedside concurrently with Dr. Wonda Amis Disposition Plan: Home Consults called: PCCM/[place]    ELLIS,ALLISON  L. ANP-BC [facility] Pager [phone]   If 7PM-7AM, please contact night-coverage [email or web address] Password [email or web address]  09/13/2016, 9:01 AM

## 2016-09-13 NOTE — Progress Notes (Signed)
Patient to transfer to [id number]-[id number]. Report called to [place].

## 2016-09-13 NOTE — Progress Notes (Signed)
ANTICOAGULATION CONSULT NOTE - Initial Consult  Pharmacy Consult for Lovenox  Indication: history of a TIA  Allergies  Allergen Reactions  . Ciprofloxacin Rash    REACTION: hives/rash    Patient Measurements: Weight: 209 lb (94.8 kg)  Vital Signs: Temp: 96.7 F (35.9 C) (08/08 0305) Temp Source: Temporal (08/08 0305) BP: 110/58 (08/08 0717) Pulse Rate: 115 (08/08 0831)  Labs:  Recent Labs  09/13/16 0248  HGB 11.8*  HCT 44.3  PLT 271  CREATININE 0.84    Estimated Creatinine Clearance: 80.6 mL/min (by C-G formula based on SCr of 0.84 mg/dL).    Assessment: [name] is a 61 yo female with a history of TIA on chronic Xarelto. Patient is going to be bridged with enoxaparin due to intubation and possible need for a lung biopsy during this admission. Pulmonology is evaluating her now. H/H is currently within normal limits and stable and renal function is good.   Goal of Therapy:  Monitor platelets by anticoagulation protocol: Yes   Plan:  Enoxaparin 1 mg/kg every 12 hours  CBC at least every 72 hours Monitor for signs/symptoms of bleeding   Susa Raring, PharmD PGY2 Infectious Diseases Pharmacy Resident Pager: [phone] 09/13/2016,9:17 AM

## 2016-09-13 NOTE — ED Provider Notes (Signed)
[facility] DEPT Provider Note   CSN: [id number] Arrival date & time: 09/13/16  0245  By signing my name below, I, Ny'Kea Lewis, attest that this documentation has been prepared under the direction and in the presence of Veryl Speak, MD. Electronically Signed: Lise Auer, ED Scribe. 09/13/16. 3:22 AM.  History   Chief Complaint Chief Complaint  Patient presents with  . Shortness of Breath   The history is provided by the EMS personnel. The history is limited by the condition of the patient. No language interpreter was used.    LEVEL V CAVEAT: HPI and ROS limited due to acuity of condition.  HPI Comments: Shirel Mallis is a 61 y.o. female with a PMHx of MI, CHF, COPD, CAD, CVA, and DM II, brought in by ambulance, who presents to the Emergency Department for shortness of breath. Per EMS, pt was found in respiratory distress. EMS vitals were as followed RR 30, SpO2 below 50% on RA, and 85% on CPAP. She received 2  DuoNeb's  and .03 mg of Epi 1:1.   Past Medical History:  Diagnosis Date  . Acute MI ([place])   . Arthritis   . CHF (congestive heart failure) ([place])   . COPD (chronic obstructive pulmonary disease) ([place])    home O2  . Coronary artery disease   . Diabetes mellitus without complication ([place])   . Shortness of breath   . Stroke [facility]) TIA    Patient Active Problem List   Diagnosis Date Noted  . Chronic diastolic CHF (congestive heart failure) ([place]) 06/26/2016  . Mass of upper lobe of left lung 06/26/2016  . Ventilator dependent ([place])   . Acute respiratory failure with hypercapnia ([place]) 06/14/2016  . Goals of care, counseling/discussion   . Acute on chronic congestive heart failure ([place])   . 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 Pressure injury of skin 12/04/2015  . Coronary artery disease without angina pectoris   . Morbid obesity ([place])   . Type 2 diabetes mellitus with complication, with long-term current use of insulin ([place]) 12/20/2012  . Atrial fibrillation ([place])  12/20/2012  . Chronic respiratory failure ([place]) 12/20/2012  . HYPERLIPIDEMIA 03/11/2008  . Essential hypertension 03/11/2008  . CORONARY HEART DISEASE 03/11/2008   Past Surgical History:  Procedure Laterality Date  . ABDOMINAL HYSTERECTOMY    . CARDIAC CATHETERIZATION    . CORONARY ARTERY BYPASS GRAFT     2003   OB History    No data available     Home Medications    Prior to Admission medications   Medication Sig Start Date End Date Taking? Authorizing Provider  atorvastatin (LIPITOR) 20 MG tablet Take 1 tablet (20 mg total) by mouth daily. 02/25/13   Elsie Stain, MD  gabapentin (NEURONTIN) 100 MG capsule Take 100 mg by mouth 3 (three) times daily. 03/18/15 06/14/17  [provider]  HYDROcodone-acetaminophen (NORCO/VICODIN) 5-325 MG tablet Take 1-2 tablets by mouth every 6 (six) hours as needed for moderate pain. 06/23/16   Mariel Aloe, MD  insulin aspart (NOVOLOG FLEXPEN) 100 UNIT/ML SOPN FlexPen Inject 6 Units into the skin 3 (three) times daily with meals. Patient taking differently: Inject 20 Units into the skin 3 (three) times daily with meals.  12/18/12   Caren Griffins, MD  Insulin Degludec (TRESIBA FLEXTOUCH) 100 UNIT/ML SOPN Inject 20 Units into the skin daily.    [provider]  Ipratropium-Albuterol (COMBIVENT RESPIMAT) 20-100 MCG/ACT AERS respimat Inhale 1-2 puffs into the lungs every 4 (four) hours as  needed for wheezing. 02/25/13   Elsie Stain, MD  ipratropium-albuterol (DUONEB) 0.5-2.5 (3) MG/3ML SOLN Take 3 mLs by nebulization every 4 (four) hours as needed for shortness of breath or wheezing. 06/13/16   [provider]  metoprolol tartrate (LOPRESSOR) 50 MG tablet Take 1 tablet (50 mg total) by mouth 2 (two) times daily. Patient taking differently: Take 25 mg by mouth 2 (two) times daily.  05/27/15   Geradine Girt, DO  predniSONE (DELTASONE) 20 MG tablet Take 20mg  for 3 days, then take 10 mg for 4 days 06/24/16   Mariel Aloe,  MD  Rivaroxaban (XARELTO) 20 MG TABS tablet Take 1 tablet (20 mg total) by mouth daily. 02/25/13   Elsie Stain, MD   Family History Family History  Problem Relation Age of Onset  . Lung cancer Brother   . Emphysema Brother   . COPD Sister   . Heart attack Sister   . Diabetes Brother   . Lung cancer Brother   . Emphysema Sister    Social History Social History  Substance Use Topics  . Smoking status: Former Smoker    Packs/day: 3.00    Years: 40.00    Types: Cigarettes    Quit date: 02/06/2009  . Smokeless tobacco: Never Used  . Alcohol use No   Allergies   Ciprofloxacin  Review of Systems Review of Systems  Unable to perform ROS: Acuity of condition   Physical Exam Updated Vital Signs SpO2 (!) 50% Comment: below 50 on room air  Physical Exam  Constitutional: She is oriented to person, place, and time. She appears well-developed and well-nourished. No distress.  HENT:  Head: Normocephalic and atraumatic.  Eyes: EOM are normal.  Neck: Normal range of motion.  Cardiovascular: Normal rate, regular rhythm and normal heart sounds.   Pulmonary/Chest:  Pt is is moderate to severe respiratory distress. Breath sounds are course with CPAP in place.  Abdominal: Soft. Bowel sounds are normal. She exhibits no distension. There is no tenderness.  Musculoskeletal: Normal range of motion. She exhibits no edema.  Neurological: She is alert and oriented to person, place, and time.  Skin: Skin is warm and dry.  Psychiatric: She has a normal mood and affect. Judgment normal.  Nursing note and vitals reviewed.   ED Treatments / Results  DIAGNOSTIC STUDIES: Oxygen Saturation is 98% on CPAP, normal by my interpretation.   COORDINATION OF CARE: 2: 26 AM-Discussed next steps with pt. Pt verbalized understanding and is agreeable with the plan.   Labs (all labs ordered are listed, but only abnormal results are displayed) Labs Reviewed  CBC WITH DIFFERENTIAL/PLATELET    COMPREHENSIVE METABOLIC PANEL  BRAIN NATRIURETIC PEPTIDE    EKG  EKG Interpretation None       Radiology No results found.  Procedures Procedures (including critical care time)  Medications Ordered in ED Medications - No data to display   Initial Impression / Assessment and Plan / ED Course  I have reviewed the triage vital signs and the nursing notes.  Pertinent labs & imaging results that were available during my care of the patient were reviewed by me and considered in my medical decision making (see chart for details).  Patient with history of COPD presenting with respiratory distress. CPAP had been applied by EMS and this was switched to BiPAP upon arrival to the ED. Her workup is consistent with an exacerbation of COPD. Initial blood gas reveals a respiratory acidosis with PCO2 of 113. She remained  on BiPAP for an hour and this was repeated. Her PCO2 is now 85 and she appears to be improving clinically.  I've discussed the case with Dr. Blaine Hamper who agrees to admit to stepdown for further BiPAP and treatment.  CRITICAL CARE Performed by: Veryl Speak Total critical care time: 45 minutes Critical care time was exclusive of separately billable procedures and treating other patients. Critical care was necessary to treat or prevent imminent or life-threatening deterioration. Critical care was time spent personally by me on the following activities: development of treatment plan with patient and/or surrogate as well as nursing, discussions with consultants, evaluation of patient's response to treatment, examination of patient, obtaining history from patient or surrogate, ordering and performing treatments and interventions, ordering and review of laboratory studies, ordering and review of radiographic studies, pulse oximetry and re-evaluation of patient's condition.   Final Clinical Impressions(s) / ED Diagnoses   Final diagnoses:  None    New Prescriptions New  Prescriptions   No medications on file  I personally performed the services described in this documentation, which was scribed in my presence. The recorded information has been reviewed and is accurate.        Veryl Speak, MD 09/13/16 [phone]

## 2016-09-13 NOTE — ED Notes (Signed)
Pt remains jittery, automatic cuff reading high. Manual 110/58

## 2016-09-13 NOTE — Progress Notes (Signed)
This is a no charge note  Pending admission per Dr. Stark Jock.  62 year old lady with past medical history of COPD, diastolic congestive heart failure, diabetes mellitus, hyperlipidemia, stroke, CAD, atrial fibrillation on Xarelto, who presents with acute respiratory distress, oxygen saturation down to 50%. Patient was started with BiPAP. ABG showed pH of 7.259--> 7.345, PCO2 of 115--> 88.9, PO2 78-->126. Clinically improving per ED physician. Respiratory rate down from 28 to 18 now. Likely due to combination of COPD and CHF exacerbation. BNP 815. We'll give one dose of Lasix. Continue breathing treatment. Patient is admitted to stepdown as inpatient. Will give 40 mg of lasix IV now.  Ivor Costa, MD  [facility] Pager [phone]  If 7PM-7AM, please contact night-coverage [email or web address] Password [email or web address] 09/13/2016, 5:51 AM

## 2016-09-13 NOTE — ED Notes (Signed)
Pt arrives via EMS from home with c/o shortness of breath, increased work of breathing. Tachypnic RR 30 upon EMS arrival, SPO2 on room air below 50. 85% on CPAP upon arrival to treatment room. In addition to CPAP, pt gave 2 duonebs, 03mg  epi 1:1. No IV access in the fields.

## 2016-09-13 NOTE — Consult Note (Signed)
PULMONARY / CRITICAL CARE MEDICINE   Name: [name] MRN: [id number] DOB: 02-23-1955    ADMISSION DATE:  09/13/2016 CONSULTATION DATE:  09/13/16  REFERRING MD:  Dillon Bjork MD  CHIEF COMPLAINT:  Hypercarbic respiratory failure, lung mass  HISTORY OF PRESENT ILLNESS:   61 year old with history of COPD on home O2, diabetes, coronary artery disease, heart failure, A. fib on Xarelto.. Admitted with hypercarbic respiratory failure. Initially admitted to stepdown unit on BiPAP. PCO2 improved initially but then she became more somnolent with worsening of ABG. PCCM consulted for help with further management  She has history of enlarging left hilar mass. She was supposed to follow-up at the pulmonary clinic but kept putting it off as she didn't want to deal with it. History is also notable for medical noncompliance with therapy.  PAST MEDICAL HISTORY :  She  has a past medical history of Acute and chronic respiratory failure with hypoxia ([place]); Acute MI ([place]); Arthritis; CHF (congestive heart failure) ([place]); COPD (chronic obstructive pulmonary disease) ([place]); Coronary artery disease; Diabetes mellitus without complication ([place]); Mass of left lung; Shortness of breath; and Stroke ([place]) (TIA ).  PAST SURGICAL HISTORY: She  has a past surgical history that includes Abdominal hysterectomy; Cardiac catheterization; and Coronary artery bypass graft.  Allergies  Allergen Reactions  . Ciprofloxacin Rash    REACTION: hives/rash    No current facility-administered medications on file prior to encounter.    Current Outpatient Prescriptions on File Prior to Encounter  Medication Sig  . atorvastatin (LIPITOR) 20 MG tablet Take 1 tablet (20 mg total) by mouth daily.  Marland Kitchen gabapentin (NEURONTIN) 100 MG capsule Take 100 mg by mouth 3 (three) times daily.  Marland Kitchen HYDROcodone-acetaminophen (NORCO/VICODIN) 5-325 MG tablet Take 1-2 tablets by mouth every 6 (six) hours as needed for moderate pain.  Marland Kitchen insulin aspart  (NOVOLOG FLEXPEN) 100 UNIT/ML SOPN FlexPen Inject 6 Units into the skin 3 (three) times daily with meals. (Patient taking differently: Inject 20 Units into the skin 2 (two) times daily. )  . Insulin Degludec (TRESIBA FLEXTOUCH) 100 UNIT/ML SOPN Inject 20 Units into the skin daily.  . Ipratropium-Albuterol (COMBIVENT RESPIMAT) 20-100 MCG/ACT AERS respimat Inhale 1-2 puffs into the lungs every 4 (four) hours as needed for wheezing.  Marland Kitchen ipratropium-albuterol (DUONEB) 0.5-2.5 (3) MG/3ML SOLN Take 3 mLs by nebulization every 4 (four) hours as needed for shortness of breath or wheezing.  . metoprolol tartrate (LOPRESSOR) 50 MG tablet Take 1 tablet (50 mg total) by mouth 2 (two) times daily. (Patient taking differently: Take 25 mg by mouth daily. )  . predniSONE (DELTASONE) 20 MG tablet Take 20mg  for 3 days, then take 10 mg for 4 days (Patient taking differently: Take 20 mg by mouth daily. )  . Rivaroxaban (XARELTO) 20 MG TABS tablet Take 1 tablet (20 mg total) by mouth daily.    FAMILY HISTORY:  Her    SOCIAL HISTORY: She  reports that she quit smoking about 7 years ago. Her smoking use included Cigarettes. She has a 120.00 pack-year smoking history. She has never used smokeless tobacco. She reports that she does not drink alcohol.  REVIEW OF SYSTEMS:   Unable to obtain due to altered mental status  SUBJECTIVE:    VITAL SIGNS: BP (!) 110/58   Pulse (!) 115   Temp (!) 96.7 F (35.9 C) (Temporal)   Resp (!) 25   Wt 209 lb (94.8 kg)   SpO2 98%   BMI 33.73 kg/m   HEMODYNAMICS:  VENTILATOR SETTINGS: FiO2 (%):  [40 %] 40 %  INTAKE / OUTPUT: No intake/output data recorded.  PHYSICAL EXAMINATION: Gen:      No acute distress HEENT:  EOMI, sclera anicteric Neck:     No masses; no thyromegaly Lungs:    Scatted rhonchi; normal respiratory effort CV:         Regular rate and rhythm; no murmurs Abd:      + bowel sounds; soft, non-tender; no palpable masses, no distension Ext:    No  edema; adequate peripheral perfusion Skin:      Warm and dry; no rash Neuro: Somnolent, Wakes to sternal rub  LABS:  BMET  Recent Labs Lab 09/13/16 0248  NA 141  K 5.2*  CL 87*  CO2 47*  BUN 13  CREATININE 0.84  GLUCOSE 181*    Electrolytes  Recent Labs Lab 09/13/16 0248  CALCIUM 9.3    CBC  Recent Labs Lab 09/13/16 0248  WBC 12.4*  HGB 11.8*  HCT 44.3  PLT 271    Coag's No results for input(s): APTT, INR in the last 168 hours.  Sepsis Markers No results for input(s): LATICACIDVEN, PROCALCITON, O2SATVEN in the last 168 hours.  ABG  Recent Labs Lab 09/13/16 0333 09/13/16 0520 09/13/16 0900  PHART 7.259* 7.345* 7.322*  PCO2ART 115* 88.9* 104*  PO2ART 78.7* 126* 84.0    Liver Enzymes  Recent Labs Lab 09/13/16 0248  AST 13*  ALT 13*  ALKPHOS 76  BILITOT 0.6  ALBUMIN 2.7*    Cardiac Enzymes No results for input(s): TROPONINI, PROBNP in the last 168 hours.  Glucose No results for input(s): GLUCAP in the last 168 hours.  Imaging Dg Chest Portable 1 View  Result Date: 09/13/2016 CLINICAL DATA:  Acute onset of respiratory distress. Initial encounter. EXAM: PORTABLE CHEST 1 VIEW COMPARISON:  CT of the chest performed 06/14/2016, and chest radiograph performed 06/21/2016 FINDINGS: The lungs are well-aerated. A large 6.4 cm mass is noted at the left hilum. Vascular congestion is noted. Bibasilar atelectasis is noted. No definite pleural effusion or pneumothorax is seen. The cardiomediastinal silhouette is mildly enlarged. The patient is status post median sternotomy. No acute osseous abnormalities are seen. IMPRESSION: 1. Large 6.4 cm mass at the left hilum. 2. Vascular congestion and mild cardiomegaly. 3. Bibasilar atelectasis noted. Electronically Signed   By: Garald Balding [name]   On: 09/13/2016 03:08    STUDIES:  CT scan 06/14/16- left upper lobe 4.7 centimeter mass in the left hilar area. Increase compared to October 2017. Has AP window and  right paratracheal lymphadenopathy. Indeterminate left adrenal nodule. Left main and three-vessel coronary artery disease. I have reviewed all images personally.  CULTURES: Bcx 8/8 Ucx 8/8  ANTIBIOTICS: Vanco 8/8 Cefepime 8/8  SIGNIFICANT EVENTS:  LINES/TUBES:  DISCUSSION: 61 year old with COPD, chronic respiratory failure on oxygen, left lung mass setting for malignancy Admitted with hypercarbic respiratory failure. She is failing BiPAP and was transferred to the ICU for closer monitoring and likely intubation.  ASSESSMENT / PLAN:  PULMONARY A: AECOPD Acute on chronic hypoxic, hypercarbic resp failure P:   Monitor for intubation needs Transfer to ICU Continue Vanco, cefepime Follow CXR, ABG Will need work up for mass when she is more stable  CARDIOVASCULAR A:  H/O CAD Cor pulmonale, diastolic heart failure P:  Lasix 40 mg dose repeat Follow urine output Follow troponin, BNP  RENAL A:   Stable P:   Monitor urine output and Cr  GASTROINTESTINAL A:   No  issues P:   Keep NPO for now  INFECTIOUS A:   Suspected HCAP, post obstructive PNA P:   Follow cultures Broad abx coverage  ENDOCRINE A:   DM P:   Continue SSI coverage Lantus  NEUROLOGIC A:   Encephalopathy Secondary to hypercarbia P:   Montior neuro status  FAMILY  - Updates: Husband updated at bedside.  - Inter-disciplinary family meet or Palliative Care meeting due by:  8/15  The patient is critically ill with multiple organ system failure and requires high complexity decision making for assessment and support, frequent evaluation and titration of therapies, advanced monitoring, review of radiographic studies and interpretation of complex data.   Critical Care Time devoted to patient care services, exclusive of separately billable procedures, described in this note is 35 minutes.   Marshell Garfinkel MD [facility] Pager [phone] If no answer or after 3pm  call: [phone] 09/13/2016, 10:44 AM

## 2016-09-13 NOTE — Progress Notes (Signed)
Pharmacy Antibiotic Note  [name] is a 61 y.o. female admitted on 09/13/2016 with pneumonia.  Pharmacy has been consulted for vancomycin dosing. Patient has also been started on cefepime. WBC- 12.4 and patient afebrile. Current weight per nurse is 209 pounds (94.8 kg). SCr 0.84 and CrCl ~ 81 ml/min with adjusted body weight.   Plan: Vancomycin 1500 mg X 1 , Then 1250 mg every 12 hours for 8 days  Monitor renal function and Vanc trough at [facility] Monitor cultures, s/sx of clinical improvement      Temp (24hrs), Avg:97.1 F (36.2 C), Min:96.7 F (35.9 C), Max:97.5 F (36.4 C)   Recent Labs Lab 09/13/16 0248  WBC 12.4*  CREATININE 0.84    CrCl cannot be calculated (Unknown ideal weight.).    Allergies  Allergen Reactions  . Ciprofloxacin Rash    REACTION: hives/rash    Antimicrobials this admission: 8/8 Vanc>> 8/8 Cefepime >>   Thank you for allowing pharmacy to be a part of this patient's care.  Lowella Curb PGY2 Infectious  Disease Pharmacy Resident Pager: [phone]  09/13/2016 8:16 AM

## 2016-09-13 NOTE — Progress Notes (Signed)
[facility] Progress Note Patient Name: [name] DOB: 1955/06/06 MRN: [id number]   Date of Service  09/13/2016  HPI/Events of Note  Agitation - Patient is on BiPAP.  eICU Interventions  Will order low dose Precedex IV infusion. Titrate to RASS = 0.      Intervention Category Minor Interventions: Agitation / anxiety - evaluation and management  Kawhi Diebold Eugene 09/13/2016, 6:20 PM

## 2016-09-14 LAB — COMPREHENSIVE METABOLIC PANEL
ALBUMIN: 2.3 g/dL — AB (ref 3.5–5.0)
ALT: 12 U/L — AB (ref 14–54)
AST: 13 U/L — AB (ref 15–41)
Alkaline Phosphatase: 55 U/L (ref 38–126)
BUN: 13 mg/dL (ref 6–20)
CHLORIDE: 88 mmol/L — AB (ref 101–111)
CREATININE: 0.73 mg/dL (ref 0.44–1.00)
Calcium: 8.8 mg/dL — ABNORMAL LOW (ref 8.9–10.3)
GFR calc Af Amer: >60 mL/min (ref >60)
GLUCOSE: 92 mg/dL (ref 65–99)
POTASSIUM: 4.4 mmol/L (ref 3.5–5.1)
Sodium: 146 mmol/L — ABNORMAL HIGH (ref 135–145)
Total Bilirubin: 0.7 mg/dL (ref 0.3–1.2)
Total Protein: 6.5 g/dL (ref 6.5–8.1)

## 2016-09-14 LAB — GLUCOSE, CAPILLARY
GLUCOSE-CAPILLARY: 86 mg/dL (ref 65–99)
GLUCOSE-CAPILLARY: 122 mg/dL — AB (ref 65–99)
GLUCOSE-CAPILLARY: 166 mg/dL — AB (ref 65–99)
Glucose-Capillary: 118 mg/dL — ABNORMAL HIGH (ref 65–99)
Glucose-Capillary: 122 mg/dL — ABNORMAL HIGH (ref 65–99)

## 2016-09-14 LAB — BLOOD GAS, ARTERIAL
ACID-BASE EXCESS: 27 mmol/L — AB (ref 0.0–2.0)
Bicarbonate: 54.3 mmol/L — ABNORMAL HIGH (ref 20.0–28.0)
DELIVERY SYSTEMS: POSITIVE
DRAWN BY: 511331
EXPIRATORY PAP: 5
FIO2: 60
Inspiratory PAP: 20
MODE: POSITIVE
O2 Saturation: 97.4 %
PO2 ART: 98.6 mmHg (ref 83.0–108.0)
Patient temperature: 98.6
RATE: 8 resp/min
pCO2 arterial: 100 mmHg (ref 32.0–48.0)
pH, Arterial: 7.353 (ref 7.350–7.450)

## 2016-09-14 LAB — BASIC METABOLIC PANEL
Anion gap: 9 (ref 5–15)
BUN: 16 mg/dL (ref 6–20)
CALCIUM: 9 mg/dL (ref 8.9–10.3)
CO2: 44 mmol/L — ABNORMAL HIGH (ref 22–32)
CREATININE: 0.73 mg/dL (ref 0.44–1.00)
Chloride: 89 mmol/L — ABNORMAL LOW (ref 101–111)
Glucose, Bld: 176 mg/dL — ABNORMAL HIGH (ref 65–99)
Potassium: 5 mmol/L (ref 3.5–5.1)
SODIUM: 142 mmol/L (ref 135–145)

## 2016-09-14 LAB — CBC
HEMATOCRIT: 36.5 % (ref 36.0–46.0)
Hemoglobin: 9.9 g/dL — ABNORMAL LOW (ref 12.0–15.0)
MCH: 26.8 pg (ref 26.0–34.0)
MCHC: 27.1 g/dL — AB (ref 30.0–36.0)
MCV: 98.6 fL (ref 78.0–100.0)
PLATELETS: 209 10*3/uL (ref 150–400)
RBC: 3.7 MIL/uL — ABNORMAL LOW (ref 3.87–5.11)
RDW: 15.8 % — AB (ref 11.5–15.5)
WBC: 10.1 10*3/uL (ref 4.0–10.5)

## 2016-09-14 LAB — MAGNESIUM: MAGNESIUM: 2.4 mg/dL (ref 1.7–2.4)

## 2016-09-14 MED ORDER — HEPARIN SODIUM (PORCINE) 5000 UNIT/ML IJ SOLN
5000.0000 [IU] | Freq: Three times a day (TID) | INTRAMUSCULAR | Status: DC
  Administered 2016-09-15 – 2016-09-19 (×14): 5000 [IU] via SUBCUTANEOUS
  Filled 2016-09-14 (×16): qty 1

## 2016-09-14 MED ORDER — INSULIN GLARGINE 100 UNIT/ML ~~LOC~~ SOLN
10.0000 [IU] | Freq: Every day | SUBCUTANEOUS | Status: DC
  Filled 2016-09-14: qty 0.1

## 2016-09-14 MED ORDER — LACTATED RINGERS IV SOLN
INTRAVENOUS | Status: DC
  Administered 2016-09-14: 50 mL/h via INTRAVENOUS

## 2016-09-14 MED ORDER — DEXMEDETOMIDINE HCL 200 MCG/2ML IV SOLN
0.2000 ug/kg/h | INTRAVENOUS | Status: DC
  Administered 2016-09-14 – 2016-09-15 (×2): 0.7 ug/kg/h via INTRAVENOUS
  Filled 2016-09-14 (×2): qty 4

## 2016-09-14 MED ORDER — IPRATROPIUM BROMIDE 0.02 % IN SOLN
0.5000 mg | Freq: Four times a day (QID) | RESPIRATORY_TRACT | Status: DC
  Administered 2016-09-14 – 2016-09-15 (×4): 0.5 mg via RESPIRATORY_TRACT
  Filled 2016-09-14 (×4): qty 2.5

## 2016-09-14 NOTE — Care Management Note (Addendum)
Case Management Note  Patient Details  Name: Sarah-Jane Nazario MRN: [id number] Date of Birth: [birth year]  Subjective/Objective:   Pt admitted with SOB - lung mass found                 Action/Plan:   PTA from home with husband Pilar Plate.  Pt is  on supplemental oxygen. 5L supplied by [employer].  Palliative Consult ordered per attending.   PT/OT will be ordered post consult if deemed necessary - previously refused SNF.  CM will continue to follow for discharge needs   Expected Discharge Date:                  Expected Discharge Plan:     In-House Referral:     Discharge planning Services  CM Consult  Post Acute Care Choice:    Choice offered to:     DME Arranged:    DME Agency:     HH Arranged:    HH Agency:     Status of Service:     If discussed at [employer], dates discussed:    Additional Comments:  Maryclare Labrador, RN 09/14/2016, 11:46 AM

## 2016-09-14 NOTE — Progress Notes (Signed)
PULMONARY / CRITICAL CARE MEDICINE   Name: [name] MRN: [id number] DOB: August 21, 1955    ADMISSION DATE:  09/13/2016 CONSULTATION DATE:  09/13/2016  REFERRING MD:  Dillon Bjork MD  CHIEF COMPLAINT:  Hypercarbic respiratory failure, lung mass  HISTORY OF PRESENT ILLNESS:  61 y.o. with history of COPD on home O2, diabetes, coronary artery disease, heart failure, A. fib on Xarelto.. Admitted with hypercarbic respiratory failure. Initially admitted to stepdown unit on BiPAP. PCO2 improved initially but then she became more somnolent with worsening of ABG. PCCM consulted for help with further management. She has history of enlarging left hilar mass. She was supposed to follow-up at the pulmonary clinic but kept putting it off as she didn't want to deal with it. History is also notable for medical noncompliance with therapy.  SUBJECTIVE:  Patient remains altered. No acute events overnight. Continues to require noninvasive positive pressure ventilation.  REVIEW OF SYSTEMS:  Unable to obtain with encephalopathy.  VITAL SIGNS: BP (!) 97/49   Pulse 70   Temp 98.7 F (37.1 C) (Axillary)   Resp 17   Wt 188 lb 7.9 oz (85.5 kg)   SpO2 99%   BMI 30.42 kg/m   HEMODYNAMICS:    VENTILATOR SETTINGS: FiO2 (%):  [60 %] 60 %  INTAKE / OUTPUT: I/O last 3 completed shifts: In: 643.5 [I.V.:193.5; IV Piggyback:450] Out: 2409 [Urine:1495]  PHYSICAL EXAMINATION: General:  Husband at bedside. Sleepy until awoken. No distress. Integument:  Warm & dry. Epidermal sloughing noted.  HEENT:  BiPAP mask in place. No scleral icterus. Cardiovascular:  Regular rate. No edema. No JVD appreciated.  Pulmonary:  Diminished and distant breath sounds bilaterally. Normal work of breathing on noninvasive positive pressure ventilation. Abdomen: Soft. Normal bowel sounds. Protuberant. Musculoskeletal:  Normal bulk and tone. No joint deformity or effusion appreciated. Neurological: Moving all 4. Spontaneously. It tends to  voice. Does not follow commands.   LABS:  BMET  Recent Labs Lab 09/13/16 0248 09/14/16 0356  NA 141 146*  K 5.2* 4.4  CL 87* 88*  CO2 47* >50*  BUN 13 13  CREATININE 0.84 0.73  GLUCOSE 181* 92    Electrolytes  Recent Labs Lab 09/13/16 0248 09/14/16 0356  CALCIUM 9.3 8.8*    CBC  Recent Labs Lab 09/13/16 0248 09/14/16 0356  WBC 12.4* 10.1  HGB 11.8* 9.9*  HCT 44.3 36.5  PLT 271 209    Coag's No results for input(s): APTT, INR in the last 168 hours.  Sepsis Markers  Recent Labs Lab 09/13/16 0959  PROCALCITON 0.10    ABG  Recent Labs Lab 09/13/16 0900 09/13/16 1238 09/14/16 0400  PHART 7.322* 7.411 7.353  PCO2ART 104* 85.6* 100*  PO2ART 84.0 87.1 98.6    Liver Enzymes  Recent Labs Lab 09/13/16 0248 09/14/16 0356  AST 13* 13*  ALT 13* 12*  ALKPHOS 76 55  BILITOT 0.6 0.7  ALBUMIN 2.7* 2.3*    Cardiac Enzymes No results for input(s): TROPONINI, PROBNP in the last 168 hours.  Glucose  Recent Labs Lab 09/13/16 1558 09/13/16 2042 09/13/16 2358 09/14/16 0354 09/14/16 0837 09/14/16 1235  GLUCAP 110* 92 83 86 122* 118*    Imaging No results found.  STUDIES:  CT Chest 06/14/16:  left upper lobe 4.7 centimeter mass in the left hilar area. Increase compared to October 2017. Has AP window and right paratracheal lymphadenopathy. Indeterminate left adrenal nodule. Left main and three-vessel coronary artery disease. Port CXR 8/8:  Personally reviewed by me. Low lung  volumes. Sternal wires noted. Enlarging left hilar mass compared with x-ray imaging from May. No pleural effusion appreciated. No other opacification or consolidation appreciated.  MICROBIOLOGY: MRSA PCR 8/8:  Negative Blood Cultures x2 8/8 >>> Urine Culture 8/8 >>> GNR  ANTIBIOTICS: Vancomycin 8/8 >>> Cefepime 8/8 >>>  SIGNIFICANT EVENTS: 8/08 - Admit & transfer to ICU with BiPAP requirement  LINES/TUBES: Foley >>> PIV  ASSESSMENT /  PLAN:  PULMONARY A: Acute on Chronic Hypercarbic Respiratory Failure Acute on Chronic Hypoxic Respiratory Failure COPD with Acute Exacerbation Left Lung Mass:  Enlarging.   P:   Continuing Xopenex & Atrovent Nebs q6hr Continuing Solu-Medrol IV q6hr Continuing NIPPV/BiPAP w/ goal Sat 88-94% Close monitoring for possible intubation   CARDIOVASCULAR A:  H/O CAD H/O Cor Pulmonale & Diastolic CHF  P:  Holding on further diuresis Continuous Telemetry Monitoring Vitals per unit protocol   RENAL A:   Hypernatremia:  Mild.  P:   Starting LR @ 50cc/hr Trending UOP with Foley Monitoring electrolytes & renal function daily   GASTROINTESTINAL A:   No acute issues.  P:   NPO  HEME/ONC A: Anemia:  No signs of active bleeding. Left Lung Mass:  Enlarging. Likely malignant. Hasn't kept follow-up for workup.  P: Palliative Medicine Consult to assist with addressing goals of care moving forward Trending cell counts daily w/ CBC SCDs ordered D/C Lovenox Heparin Belen q8hr starting tomorrow   INFECTIOUS A:   Sepsis Possible UTI Possible Post-Obstructive Pneumonia  P:   Awaiting finalization of cultures Continuing Empiric Vancomycin & Cefepime Trending Procalcitonin per algorithm   ENDOCRINE A:   H/O DM:  Glucose tightly controlled.  P:   Decreasing Lantus to 10u [place] daily Accu-Checks q4hr SSI per Sensitive Algorithm   NEUROLOGIC A:   Acute Encephalopathy:  Likely due to hypercarbia. H/O TIA:  On systemic anticoagulation for years.  P:   Neuro Checks q2hr Discontinuing systemic anticoagulation  Precedex gtt as needed   FAMILY  - Updates:  Husband updated at bedside at the time of my exam 8/9.   - Inter-disciplinary family meet or Palliative Care meeting due by:  8/15  DISCUSSION:  62 y.o. female with chronic hypoxic and hypercarbic respiratory failure as well as underlying COPD currently requiring noninvasive positive pressure ventilation. Altered mental  status likely due to hypercarbia. High risk for intubation at this time. Therefore patient will remain in the intensive care unit. When they discussion with the husband at bedside regarding guarded prognosis and patient's unwillingness to follow up for workup of her lung mass. Palliative medicine has been consulted to provide further goals of care discussions moving forward with THE patient will stabilize and be able to participate.   I have spent a total of 32 minutes of critical care time today caring for the patient, updating her husband at bedside, and reviewing the patient's electronic medical record.   Sonia Baller Ashok Cordia, [name] [facility] Pager:  [phone] After 3pm or if no response, call [phone] 09/14/2016, 1:14 PM

## 2016-09-14 NOTE — Progress Notes (Signed)
[place] Progress Note Patient Name: [name] DOB: 1955/10/25 MRN: [id number]   Date of Service  09/14/2016  HPI/Events of Note  Patient having PAC's.  eICU Interventions  Will order: 1. BMP and Mg++ level STAT.     Intervention Category Major Interventions: Arrhythmia - evaluation and management  Sommer,Steven [place] 09/14/2016, 9:30 PM

## 2016-09-15 DIAGNOSIS — Z515 Encounter for palliative care: Secondary | ICD-10-CM

## 2016-09-15 DIAGNOSIS — E8729 Other acidosis: Secondary | ICD-10-CM

## 2016-09-15 DIAGNOSIS — E872 Acidosis: Secondary | ICD-10-CM

## 2016-09-15 DIAGNOSIS — E119 Type 2 diabetes mellitus without complications: Secondary | ICD-10-CM

## 2016-09-15 LAB — CBC WITH DIFFERENTIAL/PLATELET
Basophils Absolute: 0 10*3/uL (ref 0.0–0.1)
Basophils Relative: 0 %
EOS PCT: 0 %
Eosinophils Absolute: 0 10*3/uL (ref 0.0–0.7)
HCT: 37.3 % (ref 36.0–46.0)
Hemoglobin: 10.4 g/dL — ABNORMAL LOW (ref 12.0–15.0)
LYMPHS ABS: 0.6 10*3/uL — AB (ref 0.7–4.0)
LYMPHS PCT: 8 %
MCH: 26.2 pg (ref 26.0–34.0)
MCHC: 27.9 g/dL — AB (ref 30.0–36.0)
MCV: 94 fL (ref 78.0–100.0)
MONO ABS: 0.3 10*3/uL (ref 0.1–1.0)
MONOS PCT: 4 %
Neutro Abs: 6 10*3/uL (ref 1.7–7.7)
Neutrophils Relative %: 88 %
PLATELETS: 173 10*3/uL (ref 150–400)
RBC: 3.97 MIL/uL (ref 3.87–5.11)
RDW: 14.9 % (ref 11.5–15.5)
WBC: 6.8 10*3/uL (ref 4.0–10.5)

## 2016-09-15 LAB — BLOOD GAS, ARTERIAL
Acid-Base Excess: 20.2 mmol/L — ABNORMAL HIGH (ref 0.0–2.0)
BICARBONATE: 46.5 mmol/L — AB (ref 20.0–28.0)
Delivery systems: POSITIVE
Drawn by: 25203
EXPIRATORY PAP: 8
FIO2: 50
INSPIRATORY PAP: 20
O2 Saturation: 96.8 %
Patient temperature: 98.6
pCO2 arterial: 78.3 mmHg (ref 32.0–48.0)
pH, Arterial: 7.392 (ref 7.350–7.450)
pO2, Arterial: 90.4 mmHg (ref 83.0–108.0)

## 2016-09-15 LAB — RESPIRATORY PANEL BY PCR
ADENOVIRUS-RVPPCR: NOT DETECTED
BORDETELLA PERTUSSIS-RVPCR: NOT DETECTED
CHLAMYDOPHILA PNEUMONIAE-RVPPCR: NOT DETECTED
CORONAVIRUS 229E-RVPPCR: NOT DETECTED
Coronavirus HKU1: NOT DETECTED
Coronavirus NL63: NOT DETECTED
Coronavirus OC43: NOT DETECTED
INFLUENZA A H1-RVPPCR: NOT DETECTED
INFLUENZA A-RVPPCR: NOT DETECTED
INFLUENZA B-RVPPCR: NOT DETECTED
Influenza A H1 2009: NOT DETECTED
Influenza A H3: NOT DETECTED
Metapneumovirus: NOT DETECTED
Mycoplasma pneumoniae: NOT DETECTED
PARAINFLUENZA VIRUS 3-RVPPCR: NOT DETECTED
PARAINFLUENZA VIRUS 4-RVPPCR: NOT DETECTED
Parainfluenza Virus 1: NOT DETECTED
Parainfluenza Virus 2: NOT DETECTED
RHINOVIRUS / ENTEROVIRUS - RVPPCR: NOT DETECTED
Respiratory Syncytial Virus: NOT DETECTED

## 2016-09-15 LAB — GLUCOSE, CAPILLARY
GLUCOSE-CAPILLARY: 167 mg/dL — AB (ref 65–99)
GLUCOSE-CAPILLARY: 174 mg/dL — AB (ref 65–99)
GLUCOSE-CAPILLARY: 182 mg/dL — AB (ref 65–99)
GLUCOSE-CAPILLARY: 182 mg/dL — AB (ref 65–99)
Glucose-Capillary: 203 mg/dL — ABNORMAL HIGH (ref 65–99)
Glucose-Capillary: 206 mg/dL — ABNORMAL HIGH (ref 65–99)

## 2016-09-15 LAB — RENAL FUNCTION PANEL
ALBUMIN: 2.4 g/dL — AB (ref 3.5–5.0)
Anion gap: 10 (ref 5–15)
BUN: 16 mg/dL (ref 6–20)
CHLORIDE: 89 mmol/L — AB (ref 101–111)
CO2: 43 mmol/L — ABNORMAL HIGH (ref 22–32)
Calcium: 9.1 mg/dL (ref 8.9–10.3)
Creatinine, Ser: 0.61 mg/dL (ref 0.44–1.00)
GFR calc Af Amer: >60 mL/min (ref >60)
GFR calc non Af Amer: >60 mL/min (ref >60)
GLUCOSE: 210 mg/dL — AB (ref 65–99)
PHOSPHORUS: 4 mg/dL (ref 2.5–4.6)
POTASSIUM: 4.9 mmol/L (ref 3.5–5.1)
Sodium: 142 mmol/L (ref 135–145)

## 2016-09-15 LAB — MAGNESIUM: Magnesium: 2.2 mg/dL (ref 1.7–2.4)

## 2016-09-15 LAB — PROCALCITONIN: Procalcitonin: <0.1 ng/mL

## 2016-09-15 LAB — URINE CULTURE

## 2016-09-15 MED ORDER — SODIUM CHLORIDE 0.9 % IV SOLN
0.2000 ug/kg/h | INTRAVENOUS | Status: DC
  Administered 2016-09-15: 0.4 ug/kg/h via INTRAVENOUS
  Administered 2016-09-16: 0.7 ug/kg/h via INTRAVENOUS
  Filled 2016-09-15 (×2): qty 2

## 2016-09-15 MED ORDER — INSULIN GLARGINE 100 UNIT/ML ~~LOC~~ SOLN
15.0000 [IU] | Freq: Every day | SUBCUTANEOUS | Status: DC
  Administered 2016-09-15 – 2016-09-17 (×3): 15 [IU] via SUBCUTANEOUS
  Filled 2016-09-15 (×3): qty 0.15

## 2016-09-15 MED ORDER — FUROSEMIDE 10 MG/ML IJ SOLN
20.0000 mg | Freq: Once | INTRAMUSCULAR | Status: AC
  Administered 2016-09-15: 20 mg via INTRAVENOUS
  Filled 2016-09-15: qty 2

## 2016-09-15 MED ORDER — IPRATROPIUM-ALBUTEROL 0.5-2.5 (3) MG/3ML IN SOLN
3.0000 mL | Freq: Four times a day (QID) | RESPIRATORY_TRACT | Status: DC
  Administered 2016-09-15 – 2016-09-16 (×4): 3 mL via RESPIRATORY_TRACT
  Filled 2016-09-15 (×4): qty 3

## 2016-09-15 MED ORDER — METHYLPREDNISOLONE SODIUM SUCC 40 MG IJ SOLR
40.0000 mg | Freq: Three times a day (TID) | INTRAMUSCULAR | Status: DC
  Administered 2016-09-15 – 2016-09-17 (×7): 40 mg via INTRAVENOUS
  Filled 2016-09-15 (×8): qty 1

## 2016-09-15 MED ORDER — ALBUTEROL SULFATE (2.5 MG/3ML) 0.083% IN NEBU
2.5000 mg | INHALATION_SOLUTION | RESPIRATORY_TRACT | Status: DC | PRN
  Administered 2016-09-16 – 2016-09-17 (×2): 2.5 mg via RESPIRATORY_TRACT
  Filled 2016-09-15 (×2): qty 3

## 2016-09-15 NOTE — Consult Note (Addendum)
Consultation Note Date: 09/15/2016   Patient Name: [name]  DOB: 31-Mar-1955  MRN: [id number]  Age / Sex: 61 y.o., female  PCP: Brand Males, MD Referring Physician: Allie Bossier, MD  Reason for Consultation: Establishing goals of care, Hospice Evaluation, Non pain symptom management, Pain control and Psychosocial/spiritual support  HPI/Patient Profile: 61 y.o. female  with past medical history of COPD on home O2, diabetes, coronary artery disease, heart failure, atrial fib on Xarelto, history of acute on chronic respiratory failure admitted on 09/13/2016 with respiratory distress. She was placed on BiPAP in the emergency room. She was found to be hypercarbic, with a PCO2 level of 1:15. After wearing BiPAP her PCO2 has come down to 78.3 on 09/15/2016. \  Patient was seen by palliative provider in May 2018. During this hospitalization she was found to have had a new lung mass with lymphadenopathy; she also required intubation for 5 days. Patient has not been compliant with follow-up for new lung mass or appointments. CT scan performed on 06/14/2016 showed left upper lobe mass measuring 4.7 cm x 4.5 cm. Per chest x-ray this mass is now grown to 6.4 cm. She has had a recent catheterization that showed left main coronary artery disease as well as 3 vessel disease, diastolic heart failure. She has been in ICU on Precedex because of associated anxiety, difficulty tolerating BiPAP, limited response to BiPAP.   Clinical Assessment and Goals of Care: Patient is not on BiPAP this morning but is still minimally responsive. Her husband is at the bedside. They have 3 grown sons. Husband confirms that his wife's choice to remain a full code and would undergo intubation again. When seen by palliative provider in May of this year patient was counseled on hospice but at that time refused. Her husband candidly states that he is  "against hospice". Apparently he views hospice as contributing to the death of his mother, although he does acknowledge that his wife was given morphine during a previous admission and went home on this and it did help her breathe.  Patient's spouse, Lawren Sexson, would be patient's healthcare proxy in the event she were unable to speak for herself. Patient is retaining CO2, confused and unable to participate in goals of care discussion currently    SUMMARY OF RECOMMENDATIONS   Patient is a full code by choice including intubation Introduced hospice discussion. As noted husband is very opposed to hospice; we do not know how patient feels about hospice however Palliative medicine plans to follow patient over the weekend to see if she is responding to supportive therapies, monitor CO2 retention.  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 As noted, husband is opposed to hospice, and patient has refused these services in the past Code Status/Advance Care Planning:  Full code    Symptom Management:   Dyspnea: Continue with BiPAP as tolerated, targeted pulmonary treatments as it directed by critical care  medicine  Agitation: Continue with Precedex for now  Palliative Prophylaxis:   Aspiration, Bowel Regimen, Delirium Protocol, Frequent Pain Assessment, Oral Care and Turn Reposition  Additional Recommendations (Limitations, Scope, Preferences):  Full Scope Treatment  Psycho-social/Spiritual:   Desire for further Chaplaincy support:no  Additional Recommendations: Grief/Bereavement Support  Prognosis:   Unable to determine however patient is at high risk for acute cardiopulmonary failure secondary to end-stage COPD now with new, 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  Discharge Planning: To Be Determined      Primary  Diagnoses: Present on Admission: . Acute on chronic respiratory failure with hypoxia and hypercapnia (HCC) . Mass of left lung . COPD exacerbation ([place]) . Chronic diastolic heart failure ([place]) . Candidal dermatitis . Acute metabolic encephalopathy . Anemia, chronic disease . UTI (urinary tract infection)   I have reviewed the medical record, interviewed the patient and family, and examined the patient. The following aspects are pertinent.  Past Medical History:  Diagnosis Date  . Acute and chronic respiratory failure with hypoxia ([place])   . Acute MI ([place])   . Arthritis   . CHF (congestive heart failure) ([place])   . COPD (chronic obstructive pulmonary disease) ([place])    home O2  . Coronary artery disease   . Diabetes mellitus without complication ([place])   . Mass of left lung   . Shortness of breath   . Stroke [facility]) TIA    Social History   Social History  . Marital status: Single    Spouse name: N/A  . Number of children: N/A  . Years of education: N/A   Social History Main Topics  . Smoking status: Former Smoker    Packs/day: 3.00    Years: 40.00    Types: Cigarettes    Quit date: 02/06/2009  . Smokeless tobacco: Never Used  . Alcohol use No  . Drug use: Unknown  . Sexual activity: Not Currently   Other Topics Concern  . None   Social History Narrative  . None   Family History  Problem Relation Age of Onset  . Lung cancer Brother   . Emphysema Brother   . COPD Sister   . Heart attack Sister   . Diabetes Brother   . Lung cancer Brother   . Emphysema Sister    Scheduled Meds: . chlorhexidine  15 mL Mouth Rinse BID  . heparin subcutaneous  5,000 Units Subcutaneous Q8H  . insulin aspart  0-9 Units Subcutaneous Q4H  . insulin glargine  15 Units Subcutaneous Daily  . ipratropium  0.5 mg Nebulization Q6H  . levalbuterol  1.25 mg Nebulization Q6H  . mouth rinse  15 mL Mouth Rinse q12n4p  . methylPREDNISolone (SOLU-MEDROL) injection  40 mg Intravenous Q8H  .  nystatin   Topical TID  . sodium chloride flush  3 mL Intravenous Q12H   Continuous Infusions: . ceFEPime (MAXIPIME) IV Stopped (09/15/16 0540)  . dexmedetomidine (PRECEDEX) IV infusion Stopped (09/15/16 1018)  . lactated ringers 50 mL/hr (09/14/16 1334)   PRN Meds:.hydrALAZINE, metoprolol tartrate, ondansetron Medications Prior to Admission:  Prior to Admission medications   Medication Sig Start Date End Date Taking? Authorizing Provider  atorvastatin (LIPITOR) 20 MG tablet Take 1 tablet (20 mg total) by mouth daily. 02/25/13  Yes Elsie Stain, MD  gabapentin (NEURONTIN) 100 MG capsule Take 100 mg by mouth 3 (three) times daily. 03/18/15 06/14/17 Yes [provider]  HYDROcodone-acetaminophen (NORCO/VICODIN) 5-325 MG tablet Take 1-2 tablets by mouth every 6 (six)  hours as needed for moderate pain. 06/23/16  Yes Mariel Aloe, MD  insulin aspart (NOVOLOG FLEXPEN) 100 UNIT/ML SOPN FlexPen Inject 6 Units into the skin 3 (three) times daily with meals. Patient taking differently: Inject 20 Units into the skin 2 (two) times daily.  12/18/12  Yes Gherghe, Vella Redhead, MD  Insulin Degludec (TRESIBA FLEXTOUCH) 100 UNIT/ML SOPN Inject 20 Units into the skin daily.   Yes [provider]  Ipratropium-Albuterol (COMBIVENT RESPIMAT) 20-100 MCG/ACT AERS respimat Inhale 1-2 puffs into the lungs every 4 (four) hours as needed for wheezing. 02/25/13  Yes Elsie Stain, MD  ipratropium-albuterol (DUONEB) 0.5-2.5 (3) MG/3ML SOLN Take 3 mLs by nebulization every 4 (four) hours as needed for shortness of breath or wheezing. 06/13/16  Yes [provider]  isosorbide mononitrate (IMDUR) 60 MG 24 hr tablet Take 60 mg by mouth daily.   Yes [provider]  metoprolol tartrate (LOPRESSOR) 50 MG tablet Take 1 tablet (50 mg total) by mouth 2 (two) times daily. Patient taking differently: Take 25 mg by mouth daily.  05/27/15  Yes Vann, Jessica U, DO  predniSONE (DELTASONE) 20 MG tablet  Take 20mg  for 3 days, then take 10 mg for 4 days Patient taking differently: Take 20 mg by mouth daily.  06/24/16  Yes Mariel Aloe, MD  Rivaroxaban (XARELTO) 20 MG TABS tablet Take 1 tablet (20 mg total) by mouth daily. 02/25/13  Yes Elsie Stain, MD   Allergies  Allergen Reactions  . Ciprofloxacin Rash    REACTION: hives/rash   Review of Systems  Unable to perform ROS: Acuity of condition    Physical Exam  Constitutional: She appears well-developed and well-nourished.  Acutely ill older female, seen in ICU; minimally responsive  HENT:  Head: Normocephalic and atraumatic.  Cardiovascular:  Tachycardic  Pulmonary/Chest:  Respirations shallow; currently not on BiPAP  Abdominal: Soft.  Genitourinary:  Genitourinary Comments: Foley  Neurological:  Minimally responsive  Skin: Skin is warm and dry. There is pallor.  Psychiatric:  Unable to test  Nursing note and vitals reviewed.   Vital Signs: BP 128/64   Pulse 62   Temp 98.8 F (37.1 C) (Oral)   Resp 16   Wt 82.9 kg (182 lb 12.2 oz)   SpO2 97%   BMI 29.50 kg/m  Pain Assessment: CPOT   Pain Score: 0-No pain   SpO2: SpO2: 97 % O2 Device:SpO2: 97 % O2 Flow Rate: .O2 Flow Rate (L/min): 4 L/min  IO: Intake/output summary:  Intake/Output Summary (Last 24 hours) at 09/15/16 1156 Last data filed at 09/15/16 1000  Gross per 24 hour  Intake          1700.48 ml  Output             1650 ml  Net            50.48 ml    LBM: Last BM Date:  (PTA) Baseline Weight: Weight: 94.8 kg (209 lb) Most recent weight: Weight: 82.9 kg (182 lb 12.2 oz)     Palliative Assessment/Data:   Flowsheet Rows     Most Recent Value  Intake Tab  Referral Department  Hospitalist  Unit at Time of Referral  ICU  Palliative Care Primary Diagnosis  Pulmonary  Date Notified  09/14/16  Palliative Care Type  Return patient Palliative Care  Reason for referral  Pain, Non-pain Symptom, Clarify Goals of Care, Counsel Regarding Hospice,  Psychosocial or Spiritual support  Date of Admission  09/13/16  Date first seen by Palliative Care  09/15/16  # of days Palliative referral response time  1 Day(s)  # of days IP prior to Palliative referral  1  Clinical Assessment  Palliative Performance Scale Score  30%  Pain Max last 24 hours  Not able to report  Pain Min Last 24 hours  Not able to report  Dyspnea Max Last 24 Hours  Not able to report  Dyspnea Min Last 24 hours  Not able to report  Nausea Max Last 24 Hours  Not able to report  Nausea Min Last 24 Hours  Not able to report  Anxiety Max Last 24 Hours  Not able to report  Anxiety Min Last 24 Hours  Not able to report  Other Max Last 24 Hours  Not able to report  Psychosocial & Spiritual Assessment  Palliative Care Outcomes  Patient/Family meeting held?  Yes  Who was at the meeting?  husband  Palliative Care Outcomes  Provided psychosocial or spiritual support  Palliative Care follow-up planned  Yes, Facility      Time In: 0900 Time Out: 1015 Time Total: 75 min Greater than 50%  of this time was spent counseling and coordinating care related to the above assessment and plan.  Signed by: Dory Horn, NP   Please contact Palliative Medicine Team phone at [phone] for questions and concerns.  For individual provider: See Shea Evans

## 2016-09-15 NOTE — Progress Notes (Signed)
   09/15/16 1205  Clinical Encounter Type  Visited With Family  Visit Type Initial  Spiritual Encounters  Spiritual Needs Emotional  Stress Factors  Patient Stress Factors Not reviewed  Family Stress Factors Family relationships  Introduction to Pt's husband. Offered emotional support and ministry of presence.

## 2016-09-15 NOTE — Progress Notes (Signed)
Patient placed on BiPap to prevent increase in CO2. RN aware of plan to trial off again later this afternoon, vitals stable, patient resting comfortably, RT will continue to monitor.

## 2016-09-15 NOTE — Progress Notes (Signed)
Pt spo2 dropped to 62% on 4L [place] with increased WOB and accessory muscle use. Pt placed back on bipap with an increase in fio2 to 75% to improve spo2. Fio2 returned to 50% with improvement in SPO2 to 99%. Husband and Pt informed need to wear bipap at this time. CCM MD made aware. RN at bedside. RT will continue to closely monitor pt.

## 2016-09-15 NOTE — Progress Notes (Signed)
Pt taken off bipap and placed on 4L [place] per MD request. Plan to keep pt on [place] and use bipap QHS and PRN. RN aware. RT will continue to monitor.

## 2016-09-15 NOTE — Progress Notes (Signed)
PROGRESS NOTE    [name]  HGD:[id number] DOB: November 22, 1955 DOA: 09/13/2016 PCP: Brand Males, MD   Brief Narrative:  61 y.o.female PMHx Chronic Respiratory Failure with hypoxia/hypercarbia (on 5 L O2 at home), Chronic Diastolic CHF, CAD, Hx MI, CVA, DM type II controlled with complication, Arthritis, Anemia of Chronic disease. Patient was hospitalized in May and discharged on 5/18 after an admission for acute respiratory failure that required intubation for 5 days. She underwent a CT of the chest that admission that demonstrated a 4.7 x 4.5 cm lung mass in the central left upper lobe that was abutting the mediastinal pleura and left upper hilum that has increased in size since October 2017;compatible with primary bronchogenic carcinoma. There were also right paratracheal lymphadenopathy suspicious for nodal metastasis. On previous admission she had postobstructive pneumonia and was treated accordingly. She was instructed once again to follow up with her pulmonologist at discharge regarding evaluation and treatment of the lung mass. In review of outpatient documentation, a phone call was made to the pulmonologist office. Patient had an appointment scheduled for 5/23 but did not show up. Office apparently contacted patient's home and husband told [name] that the patient keeps putting off evaluation of the mass. Apparently she called the office on 8/2 which is sedated at this note reporting worsening respiratory symptoms. Patient called EMS to her home overnight reporting shortness of breath with increased work of breathing O2 sats <50% on room air and increased to 96% after application of CPAP. She was given DuoNeb nebs and racemic epi prior to transport. Upon arrival to the ER patient demonstrated symptomatic hypercarbic as well as hypoxemic respiratory failure based on ABG. After treatment with BiPAP repeat ABG showed improvement in pH, PCO2 and PO2. Of note a portable chest x-ray obtained here  demonstrated the mass has significantly increased in size and is now 6.4 cm.  Patient has subsequently been transferred to the stepdown unit. She is now rather anxious,restless and agitated and tremulous. She is having mild clonic jerking. She is somewhat confused. Symptoms improved somewhat after administration of Ativan 0.5 mg 2 separate doses. Attempt was made to obtain a third ABG but was unsuccessful. She remains tachycardic with systolic blood pressure between 95 and 115. Orders given to give Xopenex neb, Solu-Medrol IV and place Foley catheter. PCCMhas been urgently contacted regarding not only need for pulmonary consultation regarding evaluation of pulmonary mass that concerns that patient may eventually require intubation this admission.   Subjective: 8/10 patient extremely sleepy, with painful stimuli will arouse. A/O 3 (does not know when). States would like to drink a gallon of water. States does not know of left lung mass. Negative CP, negative abdominal pain.     Assessment & Plan:   Principal Problem:   Acute on chronic respiratory failure with hypoxia and hypercapnia (HCC) Active Problems:   Mass of left lung   Diabetes mellitus with complication (HCC)   COPD exacerbation (HCC)   Chronic diastolic heart failure (HCC)   Candidal dermatitis   Acute metabolic encephalopathy   Anemia, chronic disease   UTI (urinary tract infection)   Acute on chronic respiratory failure with hypoxia and hypercapnia/HCAP/COPD Exacerbation -Despite maximum BiPAP treatment since admission patient continues to have hypoxia/hypercapnia. Multifactorial to include worsening COPD, left lung mass, postobstructive pneumonia?. -Would not directly engaged and stimulated patient quickly falls asleep. Increasing CO2 most likely cause. 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 -Will consider repeat chest CT to evaluate interval change if/when patient stable. However not a priority given that patient has chosen not to pursue diagnosis in the past.  Acute metabolic encephalopathy -Multifactorial to include acute on chronic respiratory failure, infection, lung neoplasm.  Diabetes mellitus type II controlled without complication  -1/51 Hemoglobin A1c= 6.3 -Lantus 15 units daily -Sensitive SSI  Chronic Diastolic CHF   -Most likely acute exacerbation secondary to respiratory failure. -Strict I&O -Daily weight -Echocardiogram pending   History of CAD -Convert home beta blocker to IV -Hold Imdur and statin since NPO  History of CVA/TIA -On chronic Xarelto -Since NPOwe'll utilize full dose Lovenox with pharmacy assisting with dosing  Candidal dermatitis -Topical nystatin powder  Anemia, chronic disease -Hgbstable around 11    Goals of care 8/9: PALLIATIVE CARE consult placed. Per chart review patient has on multiple occasions been hospitalized for acute on chronic respiratory failure. Has missed follow-ups with pulmonology for definitive diagnosis of lung mass. Eval for short-term vs long-term goals of care  Vs hospice 8/10: Spoke with Mr. Fogal length, reviewed previous chest CT scans, CXR. Discussed that given patient's significant COPD, large left lung mass probability of patient surviving this hospital stay was very poor. In addition the probability of patient surviving a code was extremely poor and that patient would most likely never, a ventilator if started given that her underlying disease process has not been addressed. Mr. Patchen agreed to DO NOT RESUSCITATE this point.    DVT prophylaxis: Subcutaneous heparin Code Status: DO NOT RESUSCITATE Family Communication: Spoke at length with husband regarding diagnosis, prognosis, and plan Disposition Plan: TBD   Consultants:  [facility]  M    Procedures/Significant Events:  Echocardiogram pending   I have personally reviewed and interpreted all radiology studies and my findings are as above.  VENTILATOR SETTINGS: BiPAP Set rate: 18 IPAP/EPAP: 20/5 FiO2: 50%   Cultures 8/8 MRSA by PCR negative 8/8 urine positive Escherichia coli 8/8 blood NGTD 8/8 strep pneumo urine antigen negative 8/10 respiratory virus panel pending     Antimicrobials: Anti-infectives    Start     Stop   09/13/16 2100  vancomycin (VANCOCIN) 1,250 mg in sodium chloride 0.9 % 250 mL IVPB  Status:  Discontinued     09/15/16 1029   09/13/16 0900  ceFEPIme (MAXIPIME) 1 g in dextrose 5 % 50 mL IVPB     09/21/16 0559   09/13/16 0900  vancomycin (VANCOCIN) 1,500 mg in sodium chloride 0.9 % 500 mL IVPB     09/13/16 1221       Devices    LINES / TUBES:      Continuous Infusions: . ceFEPime (MAXIPIME) IV Stopped (09/15/16 0540)  . dexmedetomidine (PRECEDEX) IV infusion 0.4 mcg/kg/hr (09/15/16 0626)  . lactated ringers 50 mL/hr (09/14/16 1334)  . vancomycin Stopped (09/14/16 2154)     Objective: Vitals:   09/15/16 0530 09/15/16 0600 09/15/16 0630 09/15/16 0700  BP: (!) 148/71 (!) 147/71 (!) 155/132 138/88  Pulse: 64 62 68 70  Resp: (!) 21 19 19 16   Temp:      TempSrc:      SpO2: 97% 97% 97% 96%  Weight:        Intake/Output Summary (Last 24 hours) at 09/15/16 0750 Last data filed at 09/15/16 0700  Gross per 24 hour  Intake          1832.58 ml  Output  1695 ml  Net           137.58 ml   Filed Weights   09/13/16 0800 09/14/16 0456 09/15/16 0414  Weight: 209 lb (94.8 kg) 188 lb 7.9 oz (85.5 kg) 182 lb 12.2 oz (82.9 kg)    Examination:  General: Extremely sleepy, difficult to arouse, A/O 3 (does not know when), positive acute on chronic respiratory distress Eyes: negative scleral hemorrhage, negative anisocoria, negative icterus ENT: Negative Runny nose, negative gingival bleeding,, extremely dry  mucosa (lips tongue and mouth) Neck:  Negative scars, masses, torticollis, lymphadenopathy, JVD Lungs: diffuse poor air movement throughout, no air movement appreciated LLL/lingula, negative  wheezes or crackles Cardiovascular: Regular rate and rhythm without murmur gallop or rub normal S1 and S2 Abdomen: negative abdominal pain, nondistended, positive soft, bowel sounds, no rebound, no ascites, no appreciable mass Extremities: No significant cyanosis, clubbing, or edema bilateral lower extremities Skin: Positive large lesion on right cheek consistent with basal cell carcinoma (NOTE per RN Shea Stakes nodule has been exacerbated by patient pulling off mask and scratching)  Psychiatric:  Unable to evaluate secondary to altered mental status  Central nervous system:  Spontaneously moves all extremities, negative dysarthria, negative expressive aphasia, positive receptive aphasia (most likely secondary to hypercarbia).  .     Data Reviewed: Care during the described time interval was provided by me .  I have reviewed this patient's available data, including medical history, events of note, physical examination, and all test results as part of my evaluation.   CBC:  Recent Labs Lab 09/13/16 0248 09/14/16 0356 09/15/16 0627  WBC 12.4* 10.1 6.8  NEUTROABS 10.5*  --  6.0  HGB 11.8* 9.9* 10.4*  HCT 44.3 36.5 37.3  MCV 101.1* 98.6 94.0  PLT 271 209 315   Basic Metabolic Panel:  Recent Labs Lab 09/13/16 0248 09/14/16 0356 09/14/16 2200 09/15/16 0627  NA 141 146* 142 142  K 5.2* 4.4 5.0 4.9  CL 87* 88* 89* 89*  CO2 47* >50* 44* 43*  GLUCOSE 181* 92 176* 210*  BUN 13 13 16 16   CREATININE 0.84 0.73 0.73 0.61  CALCIUM 9.3 8.8* 9.0 9.1  MG  --   --  2.4 2.2  PHOS  --   --   --  4.0   GFR: Estimated Creatinine Clearance: 79.1 mL/min (by C-G formula based on SCr of 0.61 mg/dL). Liver Function Tests:  Recent Labs Lab 09/13/16 0248 09/14/16 0356 09/15/16 0627  AST 13* 13*  --   ALT  13* 12*  --   ALKPHOS 76 55  --   BILITOT 0.6 0.7  --   PROT 7.8 6.5  --   ALBUMIN 2.7* 2.3* 2.4*   No results for input(s): LIPASE, AMYLASE in the last 168 hours. No results for input(s): AMMONIA in the last 168 hours. Coagulation Profile: No results for input(s): INR, PROTIME in the last 168 hours. Cardiac Enzymes: No results for input(s): CKTOTAL, CKMB, CKMBINDEX, TROPONINI in the last 168 hours. BNP (last 3 results) No results for input(s): PROBNP in the last 8760 hours. HbA1C: No results for input(s): HGBA1C in the last 72 hours. CBG:  Recent Labs Lab 09/14/16 0837 09/14/16 1235 09/14/16 1620 09/14/16 2014 09/14/16 2338  GLUCAP 122* 118* 122* 166* 182*   Lipid Profile: No results for input(s): CHOL, HDL, LDLCALC, TRIG, CHOLHDL, LDLDIRECT in the last 72 hours. Thyroid Function Tests: No results for input(s): TSH, T4TOTAL, FREET4, T3FREE, THYROIDAB in the last 72 hours. Anemia Panel: No  results for input(s): VITAMINB12, FOLATE, FERRITIN, TIBC, IRON, RETICCTPCT in the last 72 hours. Urine analysis:    Component Value Date/Time   COLORURINE YELLOW 09/13/2016 0944   APPEARANCEUR CLOUDY (A) 09/13/2016 0944   LABSPEC 1.008 09/13/2016 0944   PHURINE 5.0 09/13/2016 0944   GLUCOSEU NEGATIVE 09/13/2016 0944   HGBUR MODERATE (A) 09/13/2016 0944   BILIRUBINUR NEGATIVE 09/13/2016 0944   KETONESUR NEGATIVE 09/13/2016 0944   PROTEINUR NEGATIVE 09/13/2016 0944   NITRITE NEGATIVE 09/13/2016 0944   LEUKOCYTESUR LARGE (A) 09/13/2016 0944   Sepsis Labs: @LABRCNTIP (procalcitonin:4,lacticidven:4)  ) Recent Results (from the past 240 hour(s))  MRSA PCR Screening     Status: None   Collection Time: 09/13/16  8:45 AM  Result Value Ref Range Status   MRSA by PCR NEGATIVE NEGATIVE Final    Comment:        The GeneXpert MRSA Assay (FDA approved for NASAL specimens only), is one component of a comprehensive MRSA colonization surveillance program. It is not intended to diagnose  MRSA infection nor to guide or monitor treatment for MRSA infections.   Culture, Urine     Status: Abnormal   Collection Time: 09/13/16  9:44 AM  Result Value Ref Range Status   Specimen Description URINE, RANDOM  Final   Special Requests NONE  Final   Culture >=100,000 COLONIES/mL ESCHERICHIA COLI (A)  Final   Report Status 09/15/2016 FINAL  Final   Organism ID, Bacteria ESCHERICHIA COLI (A)  Final      Susceptibility   Escherichia coli - MIC*    AMPICILLIN >=32 RESISTANT Resistant     CEFAZOLIN 16 SENSITIVE Sensitive     CEFTRIAXONE <=1 SENSITIVE Sensitive     CIPROFLOXACIN <=0.25 SENSITIVE Sensitive     GENTAMICIN <=1 SENSITIVE Sensitive     IMIPENEM <=0.25 SENSITIVE Sensitive     NITROFURANTOIN <=16 SENSITIVE Sensitive     TRIMETH/SULFA <=20 SENSITIVE Sensitive     AMPICILLIN/SULBACTAM >=32 RESISTANT Resistant     PIP/TAZO 64 INTERMEDIATE Intermediate     Extended ESBL NEGATIVE Sensitive     * >=100,000 COLONIES/mL ESCHERICHIA COLI  Culture, blood (routine x 2) Call MD if unable to obtain prior to antibiotics being given     Status: None (Preliminary result)   Collection Time: 09/13/16  9:59 AM  Result Value Ref Range Status   Specimen Description BLOOD RIGHT ANTECUBITAL  Final   Special Requests   Final    BOTTLES DRAWN AEROBIC ONLY Blood Culture adequate volume   Culture NO GROWTH 1 DAY  Final   Report Status PENDING  Incomplete  Culture, blood (routine x 2) Call MD if unable to obtain prior to antibiotics being given     Status: None (Preliminary result)   Collection Time: 09/13/16 10:08 AM  Result Value Ref Range Status   Specimen Description BLOOD RIGHT HAND  Final   Special Requests   Final    BOTTLES DRAWN AEROBIC ONLY Blood Culture adequate volume   Culture NO GROWTH 1 DAY  Final   Report Status PENDING  Incomplete         Radiology Studies: No results found.      Scheduled Meds: . chlorhexidine  15 mL Mouth Rinse BID  . heparin subcutaneous   5,000 Units Subcutaneous Q8H  . insulin aspart  0-9 Units Subcutaneous Q4H  . insulin glargine  10 Units Subcutaneous Daily  . ipratropium  0.5 mg Nebulization Q6H  . levalbuterol  1.25 mg Nebulization Q6H  . mouth rinse  15 mL Mouth Rinse q12n4p  . methylPREDNISolone (SOLU-MEDROL) injection  60 mg Intravenous Q6H  . nystatin   Topical TID  . sodium chloride flush  3 mL Intravenous Q12H   Continuous Infusions: . ceFEPime (MAXIPIME) IV Stopped (09/15/16 0540)  . dexmedetomidine (PRECEDEX) IV infusion 0.4 mcg/kg/hr (09/15/16 0626)  . lactated ringers 50 mL/hr (09/14/16 1334)  . vancomycin Stopped (09/14/16 2154)     LOS: 2 days    Time spent: 40 minutes    Ruddy Swire, Geraldo Docker, MD [facility] Pager [phone]   If 7PM-7AM, please contact night-coverage [email or web address] Password [email or web address] 09/15/2016, 7:50 AM

## 2016-09-15 NOTE — Progress Notes (Signed)
PULMONARY / CRITICAL CARE MEDICINE   Name: [name] MRN: [id number] DOB: [birth year]    ADMISSION DATE:  09/13/2016 CONSULTATION DATE:  09/13/16  REFERRING MD:  Dillon Bjork MD  CHIEF COMPLAINT:  Hypercarbic respiratory failure, lung mass  Brief patient summary:   61 y.o. with history of COPD on home O2, diabetes, coronary artery disease, heart failure, A. fib on Xarelto.. Admitted with hypercarbic respiratory failure. Initially admitted to stepdown unit on BiPAP. PCO2 improved initially but then she became more somnolent with worsening of ABG. PCCM consulted for help with further management. She has history of enlarging left hilar mass. She was supposed to follow-up at the pulmonary clinic but kept putting it off as she didn't want to deal with it. History is also notable for medical noncompliance with therapy.  SUBJECTIVE:  Just taken off NPPV- [place] 4L  Continues precedex gtt at 0.3 mcg/kg/hr for agitation PMT consulted this am and patient continues to want everything done  VITAL SIGNS: BP 140/70   Pulse 87   Temp 99.8 F (37.7 C) (Axillary)   Resp 17   Wt 182 lb 12.2 oz (82.9 kg)   SpO2 95%   BMI 29.50 kg/m   HEMODYNAMICS:    VENTILATOR SETTINGS: FiO2 (%):  [40 %-60 %] 50 %  INTAKE / OUTPUT: I/O last 3 completed shifts: In: 2364.4 [I.V.:1414.4; IV Piggyback:950] Out: 2390 [Urine:2390]  PHYSICAL EXAMINATION: General:  Adult female lying in bed in NAD HEENT: MM pink/dry, pupils 3/=, scleral anicteric Neuro: sleepy, oriented to self, follows commands CV: SR, no murmur PULM: even/non-labored, rales bilaterally, diminished in bases, no wheezing GI: obese, soft, bs active  Extremities: warm/dry, no edema  Skin: no rashes, bruising to BUE  LABS:  BMET  Recent Labs Lab 09/14/16 0356 09/14/16 2200 09/15/16 0627  NA 146* 142 142  K 4.4 5.0 4.9  CL 88* 89* 89*  CO2 >50* 44* 43*  BUN 13 16 16   CREATININE 0.73 0.73 0.61  GLUCOSE 92 176* 210*     Electrolytes  Recent Labs Lab 09/14/16 0356 09/14/16 2200 09/15/16 0627  CALCIUM 8.8* 9.0 9.1  MG  --  2.4 2.2  PHOS  --   --  4.0    CBC  Recent Labs Lab 09/13/16 0248 09/14/16 0356 09/15/16 0627  WBC 12.4* 10.1 6.8  HGB 11.8* 9.9* 10.4*  HCT 44.3 36.5 37.3  PLT 271 209 173    Coag's No results for input(s): APTT, INR in the last 168 hours.  Sepsis Markers  Recent Labs Lab 09/13/16 0959 09/15/16 0627  PROCALCITON 0.10 <0.10    ABG  Recent Labs Lab 09/13/16 1238 09/14/16 0400 09/15/16 0320  PHART 7.411 7.353 7.392  PCO2ART 85.6* 100* 78.3*  PO2ART 87.1 98.6 90.4    Liver Enzymes  Recent Labs Lab 09/13/16 0248 09/14/16 0356 09/15/16 0627  AST 13* 13*  --   ALT 13* 12*  --   ALKPHOS 76 55  --   BILITOT 0.6 0.7  --   ALBUMIN 2.7* 2.3* 2.4*    Cardiac Enzymes No results for input(s): TROPONINI, PROBNP in the last 168 hours.  Glucose  Recent Labs Lab 09/14/16 0837 09/14/16 1235 09/14/16 1620 09/14/16 2014 09/14/16 2338 09/15/16 0825  GLUCAP 122* 118* 122* 166* 182* 203*    Imaging No results found.  STUDIES:  CT scan 06/14/16- left upper lobe 4.7 centimeter mass in the left hilar area. Increase compared to October 2017. Has AP window and right paratracheal lymphadenopathy. Indeterminate left  adrenal nodule. Left main and three-vessel coronary artery disease.  CULTURES: Bcx 8/8 >> Ucx 8/8 >> ecoli- pan sensitive Sputum   ANTIBIOTICS: Vanco 8/8 >> 8/10 Cefepime 8/8 >>  SIGNIFICANT EVENTS: Admit 8/8  LINES/TUBES: PIV x 2 Foley 8/8 >>  DISCUSSION: 61 year old with COPD, chronic respiratory failure on oxygen, left lung mass setting for malignancy Admitted with hypercarbic respiratory failure. She is failing BiPAP and was transferred to the ICU for closer monitoring and likely intubation.  ASSESSMENT / PLAN:  PULMONARY A: Acute on Chronic Hypercarbic Respiratory Failure Acute on Chronic Hypoxic Respiratory  Failure COPD with Acute Exacerbation Left Lung Mass:  Enlarging P:   Monitor for intubation needs as patient wishes for full therapy Continue NIPPV PRN- rotate on 4hr/ rest 2hr Wean O2 for goal sat 88-94% Decrease solumedrol IV 40mg  q8 Continue xopenex and atrovent nebs q 6  Continue cefepime Appreciate PMT assistance Lasix 20 mg x 1 CXR as needed   CARDIOVASCULAR A:  H/O CAD H/O Cor pulmonale, diastolic heart failure P:  Tele monitoring Holding diuresis   RENAL A:   Hypernatremia- improving UTI- ecoli P:   LR at 22ml/hr Trend BMP / urinary output Replace electrolytes as indicated Avoid nephrotoxic agents, ensure adequate renal perfusion  GASTROINTESTINAL A:   No issues P:   Keep NPO for now while on BiPAP PPI for SUP  INFECTIOUS A:   Sepsis UTI- +ecoli Possible post obstructive PNA vs HCAP P:   PCT neg Follow cultures D/c vanc, continue cefepime  HEME/ONC A: Anemia:  No signs of active bleeding. H/h stable Left Lung Mass:  Enlarging. Likely malignant. Hasn't kept follow-up for workup. P: PMT following, appreciate input Trending cell counts daily w/ CBC SCDs/ Heparin SQ  ENDOCRINE A:   DM P:   Continue SSI coverage Lantus increase to 15 units/ daily  NEUROLOGIC A:   Encephalopathy- ? Due to UTI Secondary to hypercarbia Agitation P:   Montior neuro status Wean precedex as able   FAMILY  - Updates: Husband updated at bedside. His personal beliefs are that he would not want aggressive care, but states he wife has previously stated she would want everything done.  Appreciate PMT to assist with GOC.  - Inter-disciplinary family meet or Palliative Care meeting due by:  8/15  CCT 40 mins  Kennieth Rad, AGACNP-BC [facility] Pgr: [phone] or if no answer [phone] 09/15/2016, 10:33 AM

## 2016-09-16 ENCOUNTER — Inpatient Hospital Stay (HOSPITAL_COMMUNITY): Payer: BLUE CROSS/BLUE SHIELD

## 2016-09-16 ENCOUNTER — Encounter (HOSPITAL_COMMUNITY): Payer: Self-pay | Admitting: Radiology

## 2016-09-16 DIAGNOSIS — C3492 Malignant neoplasm of unspecified part of left bronchus or lung: Secondary | ICD-10-CM

## 2016-09-16 DIAGNOSIS — I272 Pulmonary hypertension, unspecified: Secondary | ICD-10-CM

## 2016-09-16 DIAGNOSIS — I5032 Chronic diastolic (congestive) heart failure: Secondary | ICD-10-CM

## 2016-09-16 DIAGNOSIS — J189 Pneumonia, unspecified organism: Secondary | ICD-10-CM

## 2016-09-16 LAB — CBC WITH DIFFERENTIAL/PLATELET
BASOS ABS: 0 10*3/uL (ref 0.0–0.1)
BASOS PCT: 0 %
EOS ABS: 0 10*3/uL (ref 0.0–0.7)
Eosinophils Relative: 0 %
HCT: 37 % (ref 36.0–46.0)
HEMOGLOBIN: 10.6 g/dL — AB (ref 12.0–15.0)
Lymphocytes Relative: 12 %
Lymphs Abs: 0.8 10*3/uL (ref 0.7–4.0)
MCH: 26.9 pg (ref 26.0–34.0)
MCHC: 28.6 g/dL — AB (ref 30.0–36.0)
MCV: 93.9 fL (ref 78.0–100.0)
MONO ABS: 0.4 10*3/uL (ref 0.1–1.0)
MONOS PCT: 7 %
NEUTROS PCT: 82 %
Neutro Abs: 5.3 10*3/uL (ref 1.7–7.7)
Platelets: 163 10*3/uL (ref 150–400)
RBC: 3.94 MIL/uL (ref 3.87–5.11)
RDW: 15.2 % (ref 11.5–15.5)
WBC: 6.5 10*3/uL (ref 4.0–10.5)

## 2016-09-16 LAB — RENAL FUNCTION PANEL
ANION GAP: 7 (ref 5–15)
Albumin: 2.2 g/dL — ABNORMAL LOW (ref 3.5–5.0)
BUN: 16 mg/dL (ref 6–20)
CHLORIDE: 86 mmol/L — AB (ref 101–111)
CO2: 45 mmol/L — AB (ref 22–32)
CREATININE: 0.55 mg/dL (ref 0.44–1.00)
Calcium: 9.1 mg/dL (ref 8.9–10.3)
Glucose, Bld: 178 mg/dL — ABNORMAL HIGH (ref 65–99)
POTASSIUM: 4.5 mmol/L (ref 3.5–5.1)
Phosphorus: 4.1 mg/dL (ref 2.5–4.6)
Sodium: 138 mmol/L (ref 135–145)

## 2016-09-16 LAB — GLUCOSE, CAPILLARY
GLUCOSE-CAPILLARY: 157 mg/dL — AB (ref 65–99)
Glucose-Capillary: 108 mg/dL — ABNORMAL HIGH (ref 65–99)
Glucose-Capillary: 126 mg/dL — ABNORMAL HIGH (ref 65–99)
Glucose-Capillary: 136 mg/dL — ABNORMAL HIGH (ref 65–99)
Glucose-Capillary: 150 mg/dL — ABNORMAL HIGH (ref 65–99)
Glucose-Capillary: 183 mg/dL — ABNORMAL HIGH (ref 65–99)

## 2016-09-16 LAB — ECHOCARDIOGRAM COMPLETE: Weight: 2941.82 oz

## 2016-09-16 LAB — MAGNESIUM: MAGNESIUM: 2.2 mg/dL (ref 1.7–2.4)

## 2016-09-16 MED ORDER — IPRATROPIUM-ALBUTEROL 0.5-2.5 (3) MG/3ML IN SOLN
3.0000 mL | Freq: Three times a day (TID) | RESPIRATORY_TRACT | Status: DC
  Administered 2016-09-16 – 2016-09-19 (×8): 3 mL via RESPIRATORY_TRACT
  Filled 2016-09-16 (×11): qty 3

## 2016-09-16 MED ORDER — DEXMEDETOMIDINE HCL IN NACL 400 MCG/100ML IV SOLN
0.2000 ug/kg/h | INTRAVENOUS | Status: DC
  Administered 2016-09-16: 0.5 ug/kg/h via INTRAVENOUS
  Filled 2016-09-16: qty 100

## 2016-09-16 MED ORDER — LORAZEPAM 2 MG/ML IJ SOLN
0.5000 mg | Freq: Four times a day (QID) | INTRAMUSCULAR | Status: DC | PRN
  Administered 2016-09-16: 0.5 mg via INTRAVENOUS
  Administered 2016-09-17 (×2): 1 mg via INTRAVENOUS
  Filled 2016-09-16 (×3): qty 1

## 2016-09-16 MED ORDER — PERFLUTREN LIPID MICROSPHERE
1.0000 mL | INTRAVENOUS | Status: AC | PRN
  Administered 2016-09-16: 2 mL via INTRAVENOUS
  Filled 2016-09-16: qty 10

## 2016-09-16 MED ORDER — IOPAMIDOL (ISOVUE-300) INJECTION 61%
INTRAVENOUS | Status: AC
  Administered 2016-09-16: 100 mL
  Filled 2016-09-16: qty 100

## 2016-09-16 NOTE — Progress Notes (Signed)
Attempted to place patient on BiPAP before CT trip, but patient refused, stating that she "will not be wearing that machine." Patient educated on the risks respiratory distress while she is lying flat during the imaging procedure, but patient continued to refuse.   Patient is on 4.5L nasal cannula without acute distress at this time, and SpO2 of 99%. RT will continue to monitor.

## 2016-09-16 NOTE — Progress Notes (Signed)
PROGRESS NOTE    [name]  UUV:[id number] DOB: July 17, 1955 DOA: 09/13/2016 PCP: Brand Males, MD   Brief Narrative:  61 y.o.female PMHx Chronic Respiratory Failure with hypoxia/hypercarbia (on 5 L O2 at home), Chronic Diastolic CHF, CAD, Hx MI, CVA, DM type II controlled with complication, Arthritis, Anemia of Chronic disease. Patient was hospitalized in May and discharged on 5/18 after an admission for acute respiratory failure that required intubation for 5 days. She underwent a CT of the chest that admission that demonstrated a 4.7 x 4.5 cm lung mass in the central left upper lobe that was abutting the mediastinal pleura and left upper hilum that has increased in size since October 2017;compatible with primary bronchogenic carcinoma. There were also right paratracheal lymphadenopathy suspicious for nodal metastasis. On previous admission she had postobstructive pneumonia and was treated accordingly. She was instructed once again to follow up with her pulmonologist at discharge regarding evaluation and treatment of the lung mass. In review of outpatient documentation, a phone call was made to the pulmonologist office. Patient had an appointment scheduled for 5/23 but did not show up. Office apparently contacted patient's home and husband told [name] that the patient keeps putting off evaluation of the mass. Apparently she called the office on 8/2 which is sedated at this note reporting worsening respiratory symptoms. Patient called EMS to her home overnight reporting shortness of breath with increased work of breathing O2 sats <50% on room air and increased to 47% after application of CPAP. She was given DuoNeb nebs and racemic epi prior to transport. Upon arrival to the ER patient demonstrated symptomatic hypercarbic as well as hypoxemic respiratory failure based on ABG. After treatment with BiPAP repeat ABG showed improvement in pH, PCO2 and PO2. Of note a portable chest x-ray obtained here  demonstrated the mass has significantly increased in size and is now 6.4 cm.  Patient has subsequently been transferred to the stepdown unit. She is now rather anxious,restless and agitated and tremulous. She is having mild clonic jerking. She is somewhat confused. Symptoms improved somewhat after administration of Ativan 0.5 mg 2 separate doses. Attempt was made to obtain a third ABG but was unsuccessful. She remains tachycardic with systolic blood pressure between 95 and 115. Orders given to give Xopenex neb, Solu-Medrol IV and place Foley catheter. PCCMhas been urgently contacted regarding not only need for pulmonary consultation regarding evaluation of pulmonary mass that concerns that patient may eventually require intubation this admission.   Subjective: 8/11 A/O 4, positive acute on chronic SOB, negative CP, negative abdominal pain.    Assessment & Plan:   Principal Problem:   Acute on chronic respiratory failure with hypoxia and hypercapnia (HCC) Active Problems:   Mass of left lung   Diabetes mellitus with complication (HCC)   COPD exacerbation (HCC)   Chronic diastolic heart failure (HCC)   Candidal dermatitis   Acute metabolic encephalopathy   Anemia, chronic disease   UTI (urinary tract infection)   CO2 retention   Palliative care by specialist   Acute on chronic respiratory failure with hypoxia and hypercapnia/HCAP/COPD Exacerbation -Respiratory status has improved as has her cognition. 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 -Patient has agreed  to begin cancer workup. Will start with  obtaining chest CT, head CT, abdomen and pelvis CT . -Spoke with patient and husband at length after we obtain scans will determine best place for obtaining biopsy.   Acute metabolic encephalopathy -Multifactorial to include acute on chronic respiratory failure, infection, lung neoplasm -Judicious use of all sedating medication -Patient close to baseline today  Diabetes mellitus type II controlled without complication  -0/93 Hemoglobin A1c= 6.3 -Lantus 15 units daily -Sensitive SSI  Chronic Diastolic CHF   -Most likely acute exacerbation secondary to respiratory failure. -Strict I&O since admission -1.3 L -Daily weight Filed Weights   09/14/16 0456 09/15/16 0414 09/16/16 0326  Weight: 188 lb 7.9 oz (85.5 kg) 182 lb 12.2 oz (82.9 kg) 183 lb 13.8 oz (83.4 kg)  -Echocardiogram: Findings consistent with diastolic CHF and pulmonary hypertension -Metoprolol IV, if patient's cognition stable overnight convert to PO on 8/12 -Consider starting ACEI  Pulmonary HTN -See CHF  Hx CAD -See CHF -Will begin to restart home medication  Hx CVA/TIA -On chronic Xarelto -For now continue full dose Lovenox, if cognition remained stable overnight restart Xarelto   Candidal dermatitis -Topical nystatin powder  Anemia, chronic disease -Hgbstable around 11    Goals of care 8/9: PALLIATIVE CARE consult placed. Per chart review patient has on multiple occasions been hospitalized for acute on chronic respiratory failure. Has missed follow-ups with pulmonology for definitive diagnosis of lung mass. Eval for short-term vs long-term goals of care  Vs hospice 8/10: Spoke with Mr. Josey at length, reviewed previous chest CT scans, CXR. Discussed that given patient's significant COPD, large left lung mass probability of patient surviving this hospital stay was very poor. In addition the probability of patient surviving a code was extremely poor and that patient would most likely never, come  off ventilator if started on one, given that her underlying disease process has not been addressed. Mr. Baird agreed to DO NOT RESUSCITATE this point.    DVT prophylaxis: Subcutaneous heparin Code Status: DO NOT RESUSCITATE Family Communication: Spoke at length with husband regarding diagnosis, prognosis, and plan Disposition Plan: TBD   Consultants:  [facility]    Procedures/Significant Events:  8/11 Echocardiogram: -Left ventricle: Moderate concentric LVH-LVEF= 55-60% -. Basal  inferior and inferolateral akinesis.  -Grade 2 diastolic dysfunction-Left atrium: Moderate dilation -Right ventricle: Moderate dilation     I have personally reviewed and interpreted all radiology studies and my findings are as above.  VENTILATOR SETTINGS: BiPAP Set rate: 18 IPAP/EPAP: 20/5 FiO2: 50%   Cultures 8/8 MRSA by PCR negative 8/8 urine positive Escherichia coli 8/8 blood NGTD 8/8 strep pneumo urine antigen negative 8/10 respiratory virus panel pending     Antimicrobials: Anti-infectives    Start     Stop   09/13/16 2100  vancomycin (VANCOCIN) 1,250 mg in sodium chloride 0.9 % 250 mL IVPB  Status:  Discontinued     09/15/16 1029   09/13/16 0900  ceFEPIme (MAXIPIME) 1 g in dextrose 5 % 50 mL IVPB     09/21/16 0559   09/13/16 0900  vancomycin (VANCOCIN) 1,500 mg in sodium chloride 0.9 % 500 mL IVPB     09/13/16 1221       Devices    LINES / TUBES:      Continuous Infusions: . ceFEPime (MAXIPIME) IV Stopped (09/16/16 0700)  . dexmedetomidine (PRECEDEX) IV infusion 0.4 mcg/kg/hr (09/16/16 0500)  . lactated ringers 50 mL/hr at 09/16/16 0600     Objective: Vitals:  09/16/16 0400 09/16/16 0500 09/16/16 0600 09/16/16 0716  BP: 125/64 (!) 141/58 (!) 149/66   Pulse: (!) 53 62 68   Resp: 18 18 18    Temp:      TempSrc:      SpO2: 97% 99% 100% 99%  Weight:        Intake/Output Summary (Last 24 hours) at 09/16/16 0816 Last data filed at 09/16/16 0600  Gross per 24  hour  Intake          1373.42 ml  Output             1030 ml  Net           343.42 ml   Filed Weights   09/14/16 0456 09/15/16 0414 09/16/16 0326  Weight: 188 lb 7.9 oz (85.5 kg) 182 lb 12.2 oz (82.9 kg) 183 lb 13.8 oz (83.4 kg)    Physical Exam:  General: A/O 3 (does not know when), positive acute on chronic respiratory distress (improved significantly)  Lungs: diffuse poor air movement throughout, little/no air movement LLL/lingula, negative wheezes or crackle Cardiovascular: Regular rate and rhythm without murmur gallop or rub normal S1 and S2 Abdomen: negative abdominal pain, nondistended, positive soft, bowel sounds, no rebound, no ascites, no appreciable mass Extremities: No significant cyanosis, clubbing, or edema bilateral lower extremities Skin: Positive large blister right cheek (initially looked like lesion consistent with basal cell carcinoma)  Psychiatric:  Negative depression, negative anxiety, negative fatigue, negative mania  Central nervous system:  Cranial nerves II through XII intact, tongue/uvula midline, all extremities muscle strength 5/5, sensation intact throughout, negative dysarthria, negative expressive aphasia, negative receptive aphasia.  .     Data Reviewed: Care during the described time interval was provided by me .  I have reviewed this patient's available data, including medical history, events of note, physical examination, and all test results as part of my evaluation.   CBC:  Recent Labs Lab 09/13/16 0248 09/14/16 0356 09/15/16 0627  WBC 12.4* 10.1 6.8  NEUTROABS 10.5*  --  6.0  HGB 11.8* 9.9* 10.4*  HCT 44.3 36.5 37.3  MCV 101.1* 98.6 94.0  PLT 271 209 412   Basic Metabolic Panel:  Recent Labs Lab 09/13/16 0248 09/14/16 0356 09/14/16 2200 09/15/16 0627  NA 141 146* 142 142  K 5.2* 4.4 5.0 4.9  CL 87* 88* 89* 89*  CO2 47* >50* 44* 43*  GLUCOSE 181* 92 176* 210*  BUN 13 13 16 16   CREATININE 0.84 0.73 0.73 0.61  CALCIUM 9.3  8.8* 9.0 9.1  MG  --   --  2.4 2.2  PHOS  --   --   --  4.0   GFR: Estimated Creatinine Clearance: 79.3 mL/min (by C-G formula based on SCr of 0.61 mg/dL). Liver Function Tests:  Recent Labs Lab 09/13/16 0248 09/14/16 0356 09/15/16 0627  AST 13* 13*  --   ALT 13* 12*  --   ALKPHOS 76 55  --   BILITOT 0.6 0.7  --   PROT 7.8 6.5  --   ALBUMIN 2.7* 2.3* 2.4*   No results for input(s): LIPASE, AMYLASE in the last 168 hours. No results for input(s): AMMONIA in the last 168 hours. Coagulation Profile: No results for input(s): INR, PROTIME in the last 168 hours. Cardiac Enzymes: No results for input(s): CKTOTAL, CKMB, CKMBINDEX, TROPONINI in the last 168 hours. BNP (last 3 results) No results for input(s): PROBNP in the last 8760 hours. HbA1C: No results for input(s): HGBA1C  in the last 72 hours. CBG:  Recent Labs Lab 09/15/16 0825 09/15/16 1105 09/15/16 1622 09/15/16 2002 09/16/16 0338  GLUCAP 203* 206* 174* 167* 136*   Lipid Profile: No results for input(s): CHOL, HDL, LDLCALC, TRIG, CHOLHDL, LDLDIRECT in the last 72 hours. Thyroid Function Tests: No results for input(s): TSH, T4TOTAL, FREET4, T3FREE, THYROIDAB in the last 72 hours. Anemia Panel: No results for input(s): VITAMINB12, FOLATE, FERRITIN, TIBC, IRON, RETICCTPCT in the last 72 hours. Urine analysis:    Component Value Date/Time   COLORURINE YELLOW 09/13/2016 0944   APPEARANCEUR CLOUDY (A) 09/13/2016 0944   LABSPEC 1.008 09/13/2016 0944   PHURINE 5.0 09/13/2016 0944   GLUCOSEU NEGATIVE 09/13/2016 0944   HGBUR MODERATE (A) 09/13/2016 0944   BILIRUBINUR NEGATIVE 09/13/2016 0944   KETONESUR NEGATIVE 09/13/2016 0944   PROTEINUR NEGATIVE 09/13/2016 0944   NITRITE NEGATIVE 09/13/2016 0944   LEUKOCYTESUR LARGE (A) 09/13/2016 0944   Sepsis Labs: @LABRCNTIP (procalcitonin:4,lacticidven:4)  ) Recent Results (from the past 240 hour(s))  MRSA PCR Screening     Status: None   Collection Time: 09/13/16  8:45  AM  Result Value Ref Range Status   MRSA by PCR NEGATIVE NEGATIVE Final    Comment:        The GeneXpert MRSA Assay (FDA approved for NASAL specimens only), is one component of a comprehensive MRSA colonization surveillance program. It is not intended to diagnose MRSA infection nor to guide or monitor treatment for MRSA infections.   Culture, Urine     Status: Abnormal   Collection Time: 09/13/16  9:44 AM  Result Value Ref Range Status   Specimen Description URINE, RANDOM  Final   Special Requests NONE  Final   Culture >=100,000 COLONIES/mL ESCHERICHIA COLI (A)  Final   Report Status 09/15/2016 FINAL  Final   Organism ID, Bacteria ESCHERICHIA COLI (A)  Final      Susceptibility   Escherichia coli - MIC*    AMPICILLIN >=32 RESISTANT Resistant     CEFAZOLIN 16 SENSITIVE Sensitive     CEFTRIAXONE <=1 SENSITIVE Sensitive     CIPROFLOXACIN <=0.25 SENSITIVE Sensitive     GENTAMICIN <=1 SENSITIVE Sensitive     IMIPENEM <=0.25 SENSITIVE Sensitive     NITROFURANTOIN <=16 SENSITIVE Sensitive     TRIMETH/SULFA <=20 SENSITIVE Sensitive     AMPICILLIN/SULBACTAM >=32 RESISTANT Resistant     PIP/TAZO 64 INTERMEDIATE Intermediate     Extended ESBL NEGATIVE Sensitive     * >=100,000 COLONIES/mL ESCHERICHIA COLI  Culture, blood (routine x 2) Call MD if unable to obtain prior to antibiotics being given     Status: None (Preliminary result)   Collection Time: 09/13/16  9:59 AM  Result Value Ref Range Status   Specimen Description BLOOD RIGHT ANTECUBITAL  Final   Special Requests   Final    BOTTLES DRAWN AEROBIC ONLY Blood Culture adequate volume   Culture NO GROWTH 2 DAYS  Final   Report Status PENDING  Incomplete  Culture, blood (routine x 2) Call MD if unable to obtain prior to antibiotics being given     Status: None (Preliminary result)   Collection Time: 09/13/16 10:08 AM  Result Value Ref Range Status   Specimen Description BLOOD RIGHT HAND  Final   Special Requests   Final     BOTTLES DRAWN AEROBIC ONLY Blood Culture adequate volume   Culture NO GROWTH 2 DAYS  Final   Report Status PENDING  Incomplete  Respiratory Panel by PCR  Status: None   Collection Time: 09/15/16 12:43 PM  Result Value Ref Range Status   Adenovirus NOT DETECTED NOT DETECTED Final   Coronavirus 229E NOT DETECTED NOT DETECTED Final   Coronavirus HKU1 NOT DETECTED NOT DETECTED Final   Coronavirus NL63 NOT DETECTED NOT DETECTED Final   Coronavirus OC43 NOT DETECTED NOT DETECTED Final   Metapneumovirus NOT DETECTED NOT DETECTED Final   Rhinovirus / Enterovirus NOT DETECTED NOT DETECTED Final   Influenza A NOT DETECTED NOT DETECTED Final   Influenza A H1 NOT DETECTED NOT DETECTED Final   Influenza A H1 2009 NOT DETECTED NOT DETECTED Final   Influenza A H3 NOT DETECTED NOT DETECTED Final   Influenza B NOT DETECTED NOT DETECTED Final   Parainfluenza Virus 1 NOT DETECTED NOT DETECTED Final   Parainfluenza Virus 2 NOT DETECTED NOT DETECTED Final   Parainfluenza Virus 3 NOT DETECTED NOT DETECTED Final   Parainfluenza Virus 4 NOT DETECTED NOT DETECTED Final   Respiratory Syncytial Virus NOT DETECTED NOT DETECTED Final   Bordetella pertussis NOT DETECTED NOT DETECTED Final   Chlamydophila pneumoniae NOT DETECTED NOT DETECTED Final   Mycoplasma pneumoniae NOT DETECTED NOT DETECTED Final         Radiology Studies: No results found.      Scheduled Meds: . chlorhexidine  15 mL Mouth Rinse BID  . heparin subcutaneous  5,000 Units Subcutaneous Q8H  . insulin aspart  0-9 Units Subcutaneous Q4H  . insulin glargine  15 Units Subcutaneous Daily  . ipratropium-albuterol  3 mL Nebulization Q6H  . mouth rinse  15 mL Mouth Rinse q12n4p  . methylPREDNISolone (SOLU-MEDROL) injection  40 mg Intravenous Q8H  . nystatin   Topical TID  . sodium chloride flush  3 mL Intravenous Q12H   Continuous Infusions: . ceFEPime (MAXIPIME) IV Stopped (09/16/16 0700)  . dexmedetomidine (PRECEDEX) IV infusion  0.4 mcg/kg/hr (09/16/16 0500)  . lactated ringers 50 mL/hr at 09/16/16 0600     LOS: 3 days    Time spent: 40 minutes    WOODS, Geraldo Docker, MD [facility] Pager [phone]   If 7PM-7AM, please contact night-coverage [email or web address] Password [email or web address] 09/16/2016, 8:16 AM

## 2016-09-16 NOTE — Progress Notes (Signed)
  Echocardiogram 2D Echocardiogram has been performed.  Bobbye Charleston 09/16/2016, 9:24 AM

## 2016-09-16 NOTE — Progress Notes (Signed)
Daily Progress Note   Patient Name: [name]       Date: 09/16/2016 DOB: May 10, 1955  Age: 61 y.o. MRN#: [id number] Attending Physician: Allie Bossier, MD Primary Care Physician: Brand Males, MD Admit Date: 09/13/2016  Reason for Consultation/Follow-up: Establishing goals of care, Hospice Evaluation and Psychosocial/spiritual support  Subjective: Patient continues to struggle with BiPAP. She has refused BiPAP in the night and was placed on 4 L nasal cannula. She also was refusing some laboratory draws, ABG at 0400  PCO2 is 78.6. She is still on Precedex. She is able to answer brief simple questions this morning; mild restlessness noted. She denies pain and is asking for water. Husband is at the bedside  Length of Stay: 3  Current Medications: Scheduled Meds:  . chlorhexidine  15 mL Mouth Rinse BID  . heparin subcutaneous  5,000 Units Subcutaneous Q8H  . insulin aspart  0-9 Units Subcutaneous Q4H  . insulin glargine  15 Units Subcutaneous Daily  . ipratropium-albuterol  3 mL Nebulization Q6H  . mouth rinse  15 mL Mouth Rinse q12n4p  . methylPREDNISolone (SOLU-MEDROL) injection  40 mg Intravenous Q8H  . nystatin   Topical TID  . sodium chloride flush  3 mL Intravenous Q12H    Continuous Infusions: . ceFEPime (MAXIPIME) IV Stopped (09/16/16 0700)  . dexmedetomidine (PRECEDEX) IV infusion 0.4 mcg/kg/hr (09/16/16 0500)  . lactated ringers 50 mL/hr at 09/16/16 0600    PRN Meds: albuterol, hydrALAZINE, metoprolol tartrate, ondansetron, perflutren lipid microspheres (DEFINITY) IV suspension  Physical Exam  Constitutional: She appears well-developed and well-nourished.  Acutely ill appearing older female; she is awake and answering brief simple questions  HENT:  Head:  Normocephalic and atraumatic.  Cardiovascular:  Irregular  Pulmonary/Chest:  Respirations are shallow Currently on 4 L O2  Abdominal: Soft.  Neurological: She is alert.  Oriented to self Able to answer brief, simple questions  Skin: Skin is warm and dry. There is pallor.  Psychiatric:  Restless  Nursing note and vitals reviewed.           Vital Signs: BP (!) 149/66   Pulse 68   Temp 97.8 F (36.6 C) (Oral)   Resp 18   Wt 83.4 kg (183 lb 13.8 oz)   SpO2 99%   BMI 29.68 kg/m  SpO2: SpO2: 99 % O2  Device: O2 Device: Nasal Cannula O2 Flow Rate: O2 Flow Rate (L/min): 3 L/min  Intake/output summary:  Intake/Output Summary (Last 24 hours) at 09/16/16 0947 Last data filed at 09/16/16 0600  Gross per 24 hour  Intake          1373.42 ml  Output             1030 ml  Net           343.42 ml   LBM: Last BM Date:  (PTA) Baseline Weight: Weight: 94.8 kg (209 lb) Most recent weight: Weight: 83.4 kg (183 lb 13.8 oz)       Palliative Assessment/Data:    Flowsheet Rows     Most Recent Value  Intake Tab  Referral Department  Hospitalist  Unit at Time of Referral  ICU  Palliative Care Primary Diagnosis  Pulmonary  Date Notified  09/14/16  Palliative Care Type  Return patient Palliative Care  Reason for referral  Pain, Non-pain Symptom, Clarify Goals of Care, Counsel Regarding Hospice, Psychosocial or Spiritual support  Date of Admission  09/13/16  Date first seen by Palliative Care  09/15/16  # of days Palliative referral response time  1 Day(s)  # of days IP prior to Palliative referral  1  Clinical Assessment  Palliative Performance Scale Score  30%  Pain Max last 24 hours  Not able to report  Pain Min Last 24 hours  Not able to report  Dyspnea Max Last 24 Hours  Not able to report  Dyspnea Min Last 24 hours  Not able to report  Nausea Max Last 24 Hours  Not able to report  Nausea Min Last 24 Hours  Not able to report  Anxiety Max Last 24 Hours  Not able to report    Anxiety Min Last 24 Hours  Not able to report  Other Max Last 24 Hours  Not able to report  Psychosocial & Spiritual Assessment  Palliative Care Outcomes  Patient/Family meeting held?  Yes  Who was at the meeting?  husband  Palliative Care Outcomes  Provided psychosocial or spiritual support  Palliative Care follow-up planned  Yes, Facility      Patient Active Problem List   Diagnosis Date Noted  . CO2 retention   . Palliative care by specialist   . Acute on chronic respiratory failure with hypoxia and hypercapnia ([place]) 09/13/2016  . Mass of left lung 09/13/2016  . Diabetes mellitus with complication ([place]) 13/24/4010  . COPD exacerbation ([place]) 09/13/2016  . Chronic diastolic heart failure ([place]) 09/13/2016  . Candidal dermatitis 09/13/2016  . Acute metabolic encephalopathy 27/25/3664  . Anemia, chronic disease 09/13/2016  . UTI (urinary tract infection) 09/13/2016  . Chronic diastolic CHF (congestive heart failure) ([place]) 06/26/2016  . Mass of upper lobe of left lung 06/26/2016  . Ventilator dependent ([place])   . Acute respiratory failure with hypercapnia ([place]) 06/14/2016  . Goals of care, counseling/discussion   . Acute on chronic congestive heart failure ([place])   . 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 Pressure injury of skin 12/04/2015  . Coronary artery disease without angina pectoris   . Morbid obesity ([place])   . Type 2 diabetes mellitus with complication, with long-term current use of insulin ([place]) 12/20/2012  . Atrial fibrillation ([place]) 12/20/2012  . Chronic respiratory failure ([place]) 12/20/2012  . HYPERLIPIDEMIA 03/11/2008  . Essential hypertension 03/11/2008  . CORONARY HEART DISEASE 03/11/2008    Palliative Care Assessment & Plan   Patient Profile: 61 y.o. female  with past medical history of COPD on home O2, diabetes, coronary artery disease, heart failure, atrial fib on Xarelto, history of acute on chronic respiratory failure admitted on 09/13/2016 with respiratory  distress. She was placed on BiPAP in the emergency room. She was found to be hypercarbic, with a PCO2 level of 1:15. After wearing BiPAP her PCO2 has come down to 78.3 on 09/15/2016. \  Patient was seen by palliative provider in May 2018. During this hospitalization she was found to have had a new lung mass with lymphadenopathy; she also required intubation for 5 days. Patient has not been compliant with follow-up for new lung mass or appointments. CT scan performed on 06/14/2016 showed left upper lobe mass measuring 4.7 cm x 4.5 cm. Per chest x-ray this mass is now grown to 6.4 cm. She has had a recent catheterization that showed left main coronary artery disease as well as 3 vessel disease, diastolic heart failure. She has been in ICU on Precedex because of associated anxiety, difficulty tolerating BiPAP, limited response   She continues to struggle with the usage of BiPAP, ABG on 09/16/2016 shows carbon dioxide at 78.6; echocardiogram performed at the bedside; results pending  Assessment: Patient continues to be intolerant of BiPAP for any extended period of time. ABG shows she is retaining CO2, 78.6 today. Spoke to her husband at the bedside this morning regarding his decision for DO NOT RESUSCITATE which I supported. He is able to articulate that he sees his wife struggling with medical interventions not only evidenced by refusing blood draws, BiPAP during this hospitalization but her response to follow up appointments and evaluation of this new lung mass. He is asking questions about the palliative medicine help in the home at this point. I did revisit candidly that palliative medicine in the community was not able to provide very consistent services and that his best option was hospice. He is not as resistant to that today (as noted patient's husband has had a bad experience with hospice in the past and has had a negative outlook on their services). I discussed broadly speaking that his wife would not  have to suffer with shortness of breath should she decide to completely stop BiPAP, antibiotics and go comfort care. I explained the use of opioids to manage dyspnea and how they would help with that feeling of breathlessness but at the same time would hasten her retention of CO2, and she would be sleepy  Husband is still in a watchful waiting mode to see if she is going to improve, or if she is declaring herself to be end-of-life with a hospital death potentially.  Recommendations/Plan:  Continue to treat the treatable and follow patient's lead regarding continued usage of BiPAP  Palliative medicine to continue to work with patient and patient's husband regarding potentially hospice, end-of-life/comfort care  Goals of Care and Additional Recommendations:  Limitations on Scope of Treatment: Full Scope Treatment  Code Status:    Code Status Orders        Start     Ordered   09/15/16 1230  Do not attempt resuscitation (DNR)  Continuous    Question Answer Comment  In the event of cardiac or respiratory ARREST Do not call a "code blue"   In the event of cardiac or respiratory ARREST Do not perform Intubation, CPR, defibrillation or ACLS   In the event of cardiac or respiratory ARREST Use medication by any route, position, wound care, and other measures to relive pain and suffering. May  use oxygen, suction and manual treatment of airway obstruction as needed for comfort.      09/15/16 1229    Code Status History    Date Active Date Inactive Code Status Order ID Comments User Context   09/13/2016  8:58 AM 09/15/2016 12:29 PM Full Code [id number]  Samella Parr, NP Inpatient   06/14/2016 12:29 PM 06/23/2016  8:54 PM Full Code [id number]  Omar Person, NP ED   12/02/2015 10:44 PM 12/08/2015  6:53 PM Full Code [id number]  Germain Osgood, PA-C ED   05/22/2015  3:10 AM 05/27/2015  8:02 PM Full Code [id number]  Francesca Oman, DO Inpatient   12/16/2012 12:44 AM 12/18/2012  7:15 PM Full Code  [id number]  Berle Mull, MD Inpatient       Prognosis:   Unable to determine however patient is at high risk for acute cardiopulmonary failure secondary to end-stage COPD now with new, 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    Discharge Planning:  To Be Determined  Care plan was discussed with Dr. Sherral Hammers  Thank you for allowing the Palliative Medicine Team to assist in the care of this patient.   Time In: 0825 Time Out: 0900 Total Time 35 min Prolonged Time Billed  no       Greater than 50%  of this time was spent counseling and coordinating care related to the above assessment and plan.  Dory Horn, NP  Please contact Palliative Medicine Team phone at [phone] for questions and concerns.

## 2016-09-16 NOTE — Progress Notes (Signed)
09/16/2016 0440  Patient refusing bpap. ABG done. Pt on 4LNC. Patient refusing AM lab draw. Patient's husband will be back in AM. She does better with him at bedside. AM labs postponed. MD aware.    Mick Sell RN

## 2016-09-16 NOTE — Progress Notes (Signed)
Transported and returned pt from ct, oxygen increased from 4 liter to 6 liter nasal cannula during procedure. Pt requesting breathing tx upon return to room, no change in lungs sounds and no apparent increase in sob noted. RT to bedside to assess and perform neb.

## 2016-09-16 NOTE — Progress Notes (Signed)
Patient placed on 4L nasal cannula at this time. Tolerating well, vitals within normal limits. RT will continue to monitor.

## 2016-09-17 ENCOUNTER — Inpatient Hospital Stay (HOSPITAL_COMMUNITY): Payer: BLUE CROSS/BLUE SHIELD

## 2016-09-17 DIAGNOSIS — D631 Anemia in chronic kidney disease: Secondary | ICD-10-CM

## 2016-09-17 DIAGNOSIS — N189 Chronic kidney disease, unspecified: Secondary | ICD-10-CM

## 2016-09-17 LAB — GLUCOSE, CAPILLARY
GLUCOSE-CAPILLARY: 226 mg/dL — AB (ref 65–99)
GLUCOSE-CAPILLARY: 277 mg/dL — AB (ref 65–99)
GLUCOSE-CAPILLARY: 280 mg/dL — AB (ref 65–99)
Glucose-Capillary: 165 mg/dL — ABNORMAL HIGH (ref 65–99)
Glucose-Capillary: 142 mg/dL — ABNORMAL HIGH (ref 65–99)

## 2016-09-17 LAB — BODY FLUID CELL COUNT WITH DIFFERENTIAL
EOS FL: 0 %
LYMPHS FL: 83 %
MONOCYTE-MACROPHAGE-SEROUS FLUID: 12 % — AB (ref 50–90)
Neutrophil Count, Fluid: 5 % (ref 0–25)
Total Nucleated Cell Count, Fluid: 1077 cu mm — ABNORMAL HIGH (ref 0–1000)

## 2016-09-17 LAB — CBC WITH DIFFERENTIAL/PLATELET
BASOS ABS: 0 10*3/uL (ref 0.0–0.1)
BASOS PCT: 0 %
EOS ABS: 0 10*3/uL (ref 0.0–0.7)
Eosinophils Relative: 0 %
HEMATOCRIT: 36.6 % (ref 36.0–46.0)
HEMOGLOBIN: 10.7 g/dL — AB (ref 12.0–15.0)
LYMPHS ABS: 0.3 10*3/uL — AB (ref 0.7–4.0)
Lymphocytes Relative: 4 %
MCH: 26.6 pg (ref 26.0–34.0)
MCHC: 29.2 g/dL — AB (ref 30.0–36.0)
MCV: 91 fL (ref 78.0–100.0)
MONOS PCT: 3 %
Monocytes Absolute: 0.3 10*3/uL (ref 0.1–1.0)
NEUTROS ABS: 8 10*3/uL — AB (ref 1.7–7.7)
Neutrophils Relative %: 93 %
Platelets: 190 10*3/uL (ref 150–400)
RBC: 4.02 MIL/uL (ref 3.87–5.11)
RDW: 15.4 % (ref 11.5–15.5)
WBC: 8.6 10*3/uL (ref 4.0–10.5)

## 2016-09-17 LAB — PROTEIN, PLEURAL OR PERITONEAL FLUID: TOTAL PROTEIN, FLUID: 3.1 g/dL

## 2016-09-17 LAB — RENAL FUNCTION PANEL
ALBUMIN: 2.3 g/dL — AB (ref 3.5–5.0)
Anion gap: 9 (ref 5–15)
BUN: 14 mg/dL (ref 6–20)
CALCIUM: 8.7 mg/dL — AB (ref 8.9–10.3)
CO2: 38 mmol/L — AB (ref 22–32)
Chloride: 87 mmol/L — ABNORMAL LOW (ref 101–111)
Creatinine, Ser: 0.59 mg/dL (ref 0.44–1.00)
GFR calc Af Amer: >60 mL/min (ref >60)
GFR calc non Af Amer: >60 mL/min (ref >60)
GLUCOSE: 157 mg/dL — AB (ref 65–99)
PHOSPHORUS: 3.5 mg/dL (ref 2.5–4.6)
Potassium: 4.5 mmol/L (ref 3.5–5.1)
SODIUM: 134 mmol/L — AB (ref 135–145)

## 2016-09-17 LAB — EXPECTORATED SPUTUM ASSESSMENT W GRAM STAIN, RFLX TO RESP C

## 2016-09-17 LAB — GLUCOSE, PLEURAL OR PERITONEAL FLUID: Glucose, Fluid: 210 mg/dL

## 2016-09-17 LAB — ALBUMIN, PLEURAL OR PERITONEAL FLUID: ALBUMIN FL: 1.3 g/dL

## 2016-09-17 LAB — PROCALCITONIN: Procalcitonin: <0.1 ng/mL

## 2016-09-17 LAB — MAGNESIUM: Magnesium: 2 mg/dL (ref 1.7–2.4)

## 2016-09-17 MED ORDER — INSULIN ASPART 100 UNIT/ML ~~LOC~~ SOLN
0.0000 [IU] | SUBCUTANEOUS | Status: DC
  Administered 2016-09-17: 8 [IU] via SUBCUTANEOUS
  Administered 2016-09-18: 3 [IU] via SUBCUTANEOUS
  Administered 2016-09-18 (×2): 5 [IU] via SUBCUTANEOUS

## 2016-09-17 MED ORDER — METHYLPREDNISOLONE SODIUM SUCC 40 MG IJ SOLR
40.0000 mg | Freq: Two times a day (BID) | INTRAMUSCULAR | Status: DC
  Administered 2016-09-18: 40 mg via INTRAVENOUS
  Filled 2016-09-17 (×2): qty 1

## 2016-09-17 MED ORDER — LIDOCAINE HCL (PF) 1 % IJ SOLN
INTRAMUSCULAR | Status: AC
  Filled 2016-09-17: qty 30

## 2016-09-17 MED ORDER — METOPROLOL TARTRATE 12.5 MG HALF TABLET
12.5000 mg | ORAL_TABLET | Freq: Two times a day (BID) | ORAL | Status: DC
  Administered 2016-09-17 – 2016-09-19 (×4): 12.5 mg via ORAL
  Filled 2016-09-17 (×5): qty 1

## 2016-09-17 MED ORDER — PANTOPRAZOLE SODIUM 40 MG PO TBEC
40.0000 mg | DELAYED_RELEASE_TABLET | Freq: Two times a day (BID) | ORAL | Status: DC
  Administered 2016-09-17 – 2016-09-18 (×3): 40 mg via ORAL
  Filled 2016-09-17 (×3): qty 1

## 2016-09-17 MED ORDER — INSULIN GLARGINE 100 UNIT/ML ~~LOC~~ SOLN
18.0000 [IU] | Freq: Every day | SUBCUTANEOUS | Status: DC
  Administered 2016-09-18: 18 [IU] via SUBCUTANEOUS
  Filled 2016-09-17: qty 0.18

## 2016-09-17 NOTE — Progress Notes (Signed)
Pt refused to wear BiPAP.

## 2016-09-17 NOTE — Progress Notes (Signed)
PROGRESS NOTE    [name]  VHQ:[id number] DOB: September 30, 1955 DOA: 09/13/2016 PCP: Kelli Males, MD   Brief Narrative:  61 y.o.female PMHx Chronic Respiratory Failure with hypoxia/hypercarbia (on 5 L O2 at home), Chronic Diastolic CHF, CAD, Hx MI, CVA, DM type II controlled with complication, Arthritis, Anemia of Chronic disease. Patient was hospitalized in May and discharged on 5/18 after an admission for acute respiratory failure that required intubation for 5 days. She underwent a CT of the chest that admission that demonstrated a 4.7 x 4.5 cm lung mass in the central left upper lobe that was abutting the mediastinal pleura and left upper hilum that has increased in size since October 2017;compatible with primary bronchogenic carcinoma. There were also right paratracheal lymphadenopathy suspicious for nodal metastasis. On previous admission she had postobstructive pneumonia and was treated accordingly. She was instructed once again to follow up with her pulmonologist at discharge regarding evaluation and treatment of the lung mass. In review of outpatient documentation, a phone call was made to the pulmonologist office. Patient had an appointment scheduled for 5/23 but did not show up. Office apparently contacted patient's home and husband told [name] that the patient keeps putting off evaluation of the mass. Apparently she called the office on 8/2 which is sedated at this note reporting worsening respiratory symptoms. Patient called EMS to her home overnight reporting shortness of breath with increased work of breathing O2 sats <50% on room air and increased to 41% after application of CPAP. She was given DuoNeb nebs and racemic epi prior to transport. Upon arrival to the ER patient demonstrated symptomatic hypercarbic as well as hypoxemic respiratory failure based on ABG. After treatment with BiPAP repeat ABG showed improvement in pH, PCO2 and PO2. Of note a portable chest x-ray obtained here  demonstrated the mass has significantly increased in size and is now 6.4 cm.  Patient has subsequently been transferred to the stepdown unit. She is now rather anxious,restless and agitated and tremulous. She is having mild clonic jerking. She is somewhat confused. Symptoms improved somewhat after administration of Ativan 0.5 mg 2 separate doses. Attempt was made to obtain a third ABG but was unsuccessful. She remains tachycardic with systolic blood pressure between 95 and 115. Orders given to give Xopenex neb, Solu-Medrol IV and place Foley catheter. PCCMhas been urgently contacted regarding not only need for pulmonary consultation regarding evaluation of pulmonary mass that concerns that patient may eventually require intubation this admission.   Subjective: 8/12 A/O 4, positive acute on chronic SOB, negative CP, negative abdominal pain, negative N/V     Assessment & Plan:   Principal Problem:   Acute on chronic respiratory failure with hypoxia and hypercapnia (HCC) Active Problems:   Mass of left lung   Diabetes mellitus with complication (HCC)   COPD exacerbation (HCC)   Chronic diastolic heart failure (HCC)   Candidal dermatitis   Acute metabolic encephalopathy   Anemia, chronic disease   UTI (urinary tract infection)   CO2 retention   Palliative care by specialist   Acute on chronic respiratory failure with hypoxia and hypercapnia/HCAP/COPD Exacerbation -Multifactorial, COPD exacerbation, HCAP, left lung mass (most likely malignant) -Respiratory status continues to improve. 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 -Cancer workup has begun. CT head/chest/abdomen and pelvis: Negative metastasis to head/abdomen and  pelvis see results below. -CT chest shows  increased size of left lung mass, bilateral pleural effusion. -S/P Left Thoracentesis: 200 mL turbid fluid awaiting pathology report.  -Counseled patient and husband that if pleural fluid negative for malignancy then would need to discuss Bronchoscopy with biopsy vs  IR FNA. -Discussed case with [facility] M Dr. Tera Partridge recommendation would be for EBUS if fluid nondiagnostic (significantly decreased chance of pneumothorax)  Acute metabolic encephalopathy -Multifactorial acute on chronic respiratory failure, infection, lung neoplasm -Judicious use of all sedating medication -Resolved   Diabetes mellitus type II controlled without complication  -2/70 Hemoglobin A1c= 6.3 -8/12 increase Lantus 18 units daily -Moderate SSI  Chronic Diastolic CHF   -Most likely acute exacerbation secondary to respiratory failure. -Strict I&O since admission - 1.9 L -Patient unsureweight will continue to diuresis -Daily weight Filed Weights   09/15/16 0414 09/16/16 0326 09/17/16 0500  Weight: 182 lb 12.2 oz (82.9 kg) 183 lb 13.8 oz (83.4 kg) 186 lb 1.1 oz (84.4 kg)  -Echocardiogram: Findings consistent with diastolic CHF/pulmonary hypertension -Metoprolol 12.5 mg BID -Consider starting ACEI  Pulmonary HTN -See CHF  Hx CAD -See CHF -Will begin to restart home medication  Candidal dermatitis -Topical nystatin powder  Anemia, chronic disease -Hgbstable around 11  GERD -Protonix 40 mg BID    Goals of care 8/9: PALLIATIVE CARE consult placed. Per chart review patient has on multiple occasions been hospitalized for acute on chronic respiratory failure. Has missed follow-ups with pulmonology for definitive diagnosis of lung mass. Eval for short-term vs long-term goals of care  Vs hospice 8/10: Spoke with Mr. Gloeckner at length, reviewed previous chest CT scans, CXR. Discussed that given patient's significant COPD, large left lung mass probability of patient surviving this hospital stay was  very poor. In addition the probability of patient surviving a code was extremely poor and that patient would most likely never, come off ventilator if started on one, given that her underlying disease process has not been addressed. Mr. Latella agreed to DO NOT RESUSCITATE this point.    DVT prophylaxis: Subcutaneous heparin Code Status: DO NOT RESUSCITATE Family Communication: Spoke at length with husband regarding diagnosis, prognosis, and plan Disposition Plan: Would keep patient hospitalized until cancer workup complete and plan established   Consultants:  [facility] M Dr. Tera Partridge    Procedures/Significant Events:  8/11 Echocardiogram: -Left ventricle: Moderate concentric LVH-LVEF= 55-60% -. Basal  inferior and inferolateral akinesis.  -Grade 2 diastolic dysfunction-Left atrium: Moderate dilation -Right ventricle: Moderate dilation 8/12 S/P Left Thoracentesis: 200 mL turbid fluid .     I have personally reviewed and interpreted all radiology studies and my findings are as above.  VENTILATOR SETTINGS: BiPAP Set rate: 18 IPAP/EPAP: 20/5 FiO2: 50%   Cultures 8/8 MRSA by PCR negative 8/8 urine positive Escherichia coli 8/8 blood NGTD 8/8 strep pneumo urine antigen negative 8/10 respiratory virus panel pending     Antimicrobials: Anti-infectives    Start     Stop   09/13/16 2100  vancomycin (VANCOCIN) 1,250 mg in sodium chloride 0.9 % 250 mL IVPB  Status:  Discontinued     09/15/16 1029   09/13/16 0900  ceFEPIme (MAXIPIME) 1 g in dextrose 5 % 50 mL IVPB     09/21/16 0559   09/13/16 0900  vancomycin (VANCOCIN) 1,500 mg in sodium chloride 0.9 % 500 mL IVPB     09/13/16 1221       Devices    LINES / TUBES:  Continuous Infusions: . ceFEPime (MAXIPIME) IV Stopped (09/17/16 0548)  . lactated ringers 50 mL/hr at 09/16/16 2000     Objective: Vitals:   09/17/16 0800 09/17/16 0822 09/17/16 0900 09/17/16 1000  BP: (!) 163/81 (!) 163/81 108/63 (!)  153/69  Pulse: (!) 101 100 83 92  Resp: (!) 26 20  17   Temp: 97.6 F (36.4 C)     TempSrc:      SpO2: 97% 100% 97% 100%  Weight:        Intake/Output Summary (Last 24 hours) at 09/17/16 1025 Last data filed at 09/17/16 1000  Gross per 24 hour  Intake             1400 ml  Output             2700 ml  Net            -1300 ml   Filed Weights   09/15/16 0414 09/16/16 0326 09/17/16 0500  Weight: 182 lb 12.2 oz (82.9 kg) 183 lb 13.8 oz (83.4 kg) 186 lb 1.1 oz (84.4 kg)   Physical Exam:  General: A/O 4, positive acute on chronic respiratory distress  Neck:  Negative scars, masses, torticollis, lymphadenopathy, JVD Lungs: extremely poor air movement throughout all lung fields, little/no air movement LLL/lingula, negative wheezes or crackles, inpatient  Cardiovascular: Regular rate and rhythm without murmur gallop or rub normal S1 and S2 Abdomen: negative abdominal pain, nondistended, positive soft, bowel sounds, no rebound, no ascites, no appreciable mass Extremities: No significant cyanosis, clubbing, or edema bilateral lower extremities Skin: Negative rashes, lesions, ulcers Psychiatric:  Negative depression, negative anxiety, negative fatigue, negative mania  Central nervous system:  Cranial nerves II through XII intact, tongue/uvula midline, all extremities muscle strength 5/5, sensation intact throughout, , negative dysarthria, negative expressive aphasia, negative receptive aphasia.    .     Data Reviewed: Care during the described time interval was provided by me .  I have reviewed this patient's available data, including medical history, events of note, physical examination, and all test results as part of my evaluation.   CBC:  Recent Labs Lab 09/13/16 0248 09/14/16 0356 09/15/16 0627 09/16/16 0826 09/17/16 0414  WBC 12.4* 10.1 6.8 6.5 8.6  NEUTROABS 10.5*  --  6.0 5.3 8.0*  HGB 11.8* 9.9* 10.4* 10.6* 10.7*  HCT 44.3 36.5 37.3 37.0 36.6  MCV 101.1* 98.6 94.0  93.9 91.0  PLT 271 209 173 163 509   Basic Metabolic Panel:  Recent Labs Lab 09/14/16 0356 09/14/16 2200 09/15/16 0627 09/16/16 0821 09/17/16 0414  NA 146* 142 142 138 134*  K 4.4 5.0 4.9 4.5 4.5  CL 88* 89* 89* 86* 87*  CO2 >50* 44* 43* 45* 38*  GLUCOSE 92 176* 210* 178* 157*  BUN 13 16 16 16 14   CREATININE 0.73 0.73 0.61 0.55 0.59  CALCIUM 8.8* 9.0 9.1 9.1 8.7*  MG  --  2.4 2.2 2.2 2.0  PHOS  --   --  4.0 4.1 3.5   GFR: Estimated Creatinine Clearance: 79.8 mL/min (by C-G formula based on SCr of 0.59 mg/dL). Liver Function Tests:  Recent Labs Lab 09/13/16 0248 09/14/16 0356 09/15/16 0627 09/16/16 0821 09/17/16 0414  AST 13* 13*  --   --   --   ALT 13* 12*  --   --   --   ALKPHOS 76 55  --   --   --   BILITOT 0.6 0.7  --   --   --  PROT 7.8 6.5  --   --   --   ALBUMIN 2.7* 2.3* 2.4* 2.2* 2.3*   No results for input(s): LIPASE, AMYLASE in the last 168 hours. No results for input(s): AMMONIA in the last 168 hours. Coagulation Profile: No results for input(s): INR, PROTIME in the last 168 hours. Cardiac Enzymes: No results for input(s): CKTOTAL, CKMB, CKMBINDEX, TROPONINI in the last 168 hours. BNP (last 3 results) No results for input(s): PROBNP in the last 8760 hours. HbA1C: No results for input(s): HGBA1C in the last 72 hours. CBG:  Recent Labs Lab 09/16/16 1617 09/16/16 1922 09/16/16 2347 09/17/16 0519 09/17/16 0917  GLUCAP 126* 150* 183* 165* 142*   Lipid Profile: No results for input(s): CHOL, HDL, LDLCALC, TRIG, CHOLHDL, LDLDIRECT in the last 72 hours. Thyroid Function Tests: No results for input(s): TSH, T4TOTAL, FREET4, T3FREE, THYROIDAB in the last 72 hours. Anemia Panel: No results for input(s): VITAMINB12, FOLATE, FERRITIN, TIBC, IRON, RETICCTPCT in the last 72 hours. Urine analysis:    Component Value Date/Time   COLORURINE YELLOW 09/13/2016 0944   APPEARANCEUR CLOUDY (A) 09/13/2016 0944   LABSPEC 1.008 09/13/2016 0944   PHURINE 5.0  09/13/2016 0944   GLUCOSEU NEGATIVE 09/13/2016 0944   HGBUR MODERATE (A) 09/13/2016 0944   BILIRUBINUR NEGATIVE 09/13/2016 0944   KETONESUR NEGATIVE 09/13/2016 0944   PROTEINUR NEGATIVE 09/13/2016 0944   NITRITE NEGATIVE 09/13/2016 0944   LEUKOCYTESUR LARGE (A) 09/13/2016 0944   Sepsis Labs: @LABRCNTIP (procalcitonin:4,lacticidven:4)  ) Recent Results (from the past 240 hour(s))  MRSA PCR Screening     Status: None   Collection Time: 09/13/16  8:45 AM  Result Value Ref Range Status   MRSA by PCR NEGATIVE NEGATIVE Final    Comment:        The GeneXpert MRSA Assay (FDA approved for NASAL specimens only), is one component of a comprehensive MRSA colonization surveillance program. It is not intended to diagnose MRSA infection nor to guide or monitor treatment for MRSA infections.   Culture, Urine     Status: Abnormal   Collection Time: 09/13/16  9:44 AM  Result Value Ref Range Status   Specimen Description URINE, RANDOM  Final   Special Requests NONE  Final   Culture >=100,000 COLONIES/mL ESCHERICHIA COLI (A)  Final   Report Status 09/15/2016 FINAL  Final   Organism ID, Bacteria ESCHERICHIA COLI (A)  Final      Susceptibility   Escherichia coli - MIC*    AMPICILLIN >=32 RESISTANT Resistant     CEFAZOLIN 16 SENSITIVE Sensitive     CEFTRIAXONE <=1 SENSITIVE Sensitive     CIPROFLOXACIN <=0.25 SENSITIVE Sensitive     GENTAMICIN <=1 SENSITIVE Sensitive     IMIPENEM <=0.25 SENSITIVE Sensitive     NITROFURANTOIN <=16 SENSITIVE Sensitive     TRIMETH/SULFA <=20 SENSITIVE Sensitive     AMPICILLIN/SULBACTAM >=32 RESISTANT Resistant     PIP/TAZO 64 INTERMEDIATE Intermediate     Extended ESBL NEGATIVE Sensitive     * >=100,000 COLONIES/mL ESCHERICHIA COLI  Culture, blood (routine x 2) Call MD if unable to obtain prior to antibiotics being given     Status: None (Preliminary result)   Collection Time: 09/13/16  9:59 AM  Result Value Ref Range Status   Specimen Description BLOOD  RIGHT ANTECUBITAL  Final   Special Requests   Final    BOTTLES DRAWN AEROBIC ONLY Blood Culture adequate volume   Culture NO GROWTH 3 DAYS  Final   Report Status PENDING  Incomplete  Culture, blood (routine x 2) Call MD if unable to obtain prior to antibiotics being given     Status: None (Preliminary result)   Collection Time: 09/13/16 10:08 AM  Result Value Ref Range Status   Specimen Description BLOOD RIGHT HAND  Final   Special Requests   Final    BOTTLES DRAWN AEROBIC ONLY Blood Culture adequate volume   Culture NO GROWTH 3 DAYS  Final   Report Status PENDING  Incomplete  Respiratory Panel by PCR     Status: None   Collection Time: 09/15/16 12:43 PM  Result Value Ref Range Status   Adenovirus NOT DETECTED NOT DETECTED Final   Coronavirus 229E NOT DETECTED NOT DETECTED Final   Coronavirus HKU1 NOT DETECTED NOT DETECTED Final   Coronavirus NL63 NOT DETECTED NOT DETECTED Final   Coronavirus OC43 NOT DETECTED NOT DETECTED Final   Metapneumovirus NOT DETECTED NOT DETECTED Final   Rhinovirus / Enterovirus NOT DETECTED NOT DETECTED Final   Influenza A NOT DETECTED NOT DETECTED Final   Influenza A H1 NOT DETECTED NOT DETECTED Final   Influenza A H1 2009 NOT DETECTED NOT DETECTED Final   Influenza A H3 NOT DETECTED NOT DETECTED Final   Influenza B NOT DETECTED NOT DETECTED Final   Parainfluenza Virus 1 NOT DETECTED NOT DETECTED Final   Parainfluenza Virus 2 NOT DETECTED NOT DETECTED Final   Parainfluenza Virus 3 NOT DETECTED NOT DETECTED Final   Parainfluenza Virus 4 NOT DETECTED NOT DETECTED Final   Respiratory Syncytial Virus NOT DETECTED NOT DETECTED Final   Bordetella pertussis NOT DETECTED NOT DETECTED Final   Chlamydophila pneumoniae NOT DETECTED NOT DETECTED Final   Mycoplasma pneumoniae NOT DETECTED NOT DETECTED Final  Culture, sputum-assessment     Status: None   Collection Time: 09/17/16  3:00 AM  Result Value Ref Range Status   Specimen Description SPUTUM  Final    Special Requests NONE  Final   Sputum evaluation THIS SPECIMEN IS ACCEPTABLE FOR SPUTUM CULTURE  Final   Report Status 09/17/2016 FINAL  Final         Radiology Studies: Ct Head W & Wo Contrast  Result Date: 09/16/2016 CLINICAL DATA:  61 y/o F; evaluate for intracranial metastatic disease. EXAM: CT HEAD WITHOUT AND WITH CONTRAST TECHNIQUE: Contiguous axial images were obtained from the base of the skull through the vertex without and with intravenous contrast CONTRAST:  100 cc Isovue-300 COMPARISON:  08/08/2005 CT head FINDINGS: Brain: No evidence of acute infarction, hemorrhage, hydrocephalus, extra-axial collection or mass lesion/mass effect. No abnormal enhancement of the brain. Punctate nonspecific calcified focus in right frontal subarachnoid space of unlikely significance. Vascular: Calcific atherosclerosis of carotid siphons. Visible vessels are patent. Skull: Normal. Negative for fracture or focal lesion. Sinuses/Orbits: No acute finding. Other: None. IMPRESSION: No intracranial metastatic disease identified. Unremarkable CT head. Electronically Signed   By: Kristine Garbe [name]   On: 09/16/2016 22:23   Ct Chest W Contrast  Result Date: 09/17/2016 CLINICAL DATA:  61 y/o  F; lung mass for follow-up. EXAM: CT CHEST, ABDOMEN, AND PELVIS WITH CONTRAST TECHNIQUE: Multidetector CT imaging of the chest, abdomen and pelvis was performed following the standard protocol during bolus administration of intravenous contrast. CONTRAST:  169mL ISOVUE-300 IOPAMIDOL (ISOVUE-300) INJECTION 61% COMPARISON:  06/14/2016 CT chest FINDINGS: CT CHEST FINDINGS Cardiovascular: Mild cardiomegaly. Status post CABG. Normal caliber thoracic aorta with moderate calcification. Mediastinum/Nodes: AP window and right paratracheal lymph nodes now measure less than 1 cm short axis. No new lymphadenopathy is  identified. Lungs/Pleura: Central left upper lobe mass is slightly increased in size measuring a 4.5 x 4.9 x 4.1  cm (AP x ML x CC series 4, image 16 and series 7, image 58). Stable clustered nodules within the right upper lobe superior and anterior to the mass compatible with postobstructive pneumonitis. Moderate centrilobular emphysema. Small bilateral pleural effusions. Musculoskeletal: No chest wall mass or suspicious bone lesions identified. CT ABDOMEN PELVIS FINDINGS Hepatobiliary: Stable focal lucencies along segment 4B of the liver anteriorly with fluid attenuation, likely representing cysts. Gallstones. No intra or extrahepatic biliary ductal dilatation. Pancreas: Unremarkable. No pancreatic ductal dilatation or surrounding inflammatory changes. Spleen: Normal in size without focal abnormality. Adrenals/Urinary Tract: Previous identified left adrenal nodule is poorly visualized due to motion artifact. Right kidney lower pole nonobstructing nephrolithiasis measuring 5 mm. No other kidney lesion. No hydronephrosis. Normal bladder with Foley catheter in situ. Stomach/Bowel: Stomach is within normal limits. Appendix appears normal. No evidence of bowel wall thickening, distention, or inflammatory changes. Vascular/Lymphatic: Severe calcific atherosclerosis of the abdominal aorta with infrarenal aneurysm measuring up to 3.2 cm in diameter (series 6, image 77). No abdominal lymphadenopathy identified. Reproductive: Status post hysterectomy. No adnexal masses. Other: No abdominal wall hernia or abnormality. No abdominopelvic ascites. Musculoskeletal: No acute or significant osseous findings. IMPRESSION: 1. Left upper lobe mass is slightly increased in size measuring up to 4.9 cm. 2. Mediastinal lymph nodes now measure less than 1 cm short axis. No new adenopathy identified. 3. No distant metastasis identified. 4. Left upper lobe postobstructive pneumonitis. 5. Moderate centrilobular emphysema of the lungs and small bilateral pleural effusions. 6. Left adrenal nodule is poorly visualized due to motion artifact. 7. Severe  calcific atherosclerosis of aorta. 3.2 cm infrarenal abdominal aortic aneurysm. Recommend followup by ultrasound in 3 years. This recommendation follows ACR consensus guidelines: White Paper of the ACR Incidental Findings Committee II on Vascular Findings. J Am Coll Radiol 2013; 10:789-794. Electronically Signed   By: Kristine Garbe [name]   On: 09/17/2016 00:53   Ct Abdomen Pelvis W Contrast  Result Date: 09/17/2016 CLINICAL DATA:  61 y/o  F; lung mass for follow-up. EXAM: CT CHEST, ABDOMEN, AND PELVIS WITH CONTRAST TECHNIQUE: Multidetector CT imaging of the chest, abdomen and pelvis was performed following the standard protocol during bolus administration of intravenous contrast. CONTRAST:  12mL ISOVUE-300 IOPAMIDOL (ISOVUE-300) INJECTION 61% COMPARISON:  06/14/2016 CT chest FINDINGS: CT CHEST FINDINGS Cardiovascular: Mild cardiomegaly. Status post CABG. Normal caliber thoracic aorta with moderate calcification. Mediastinum/Nodes: AP window and right paratracheal lymph nodes now measure less than 1 cm short axis. No new lymphadenopathy is identified. Lungs/Pleura: Central left upper lobe mass is slightly increased in size measuring a 4.5 x 4.9 x 4.1 cm (AP x ML x CC series 4, image 16 and series 7, image 58). Stable clustered nodules within the right upper lobe superior and anterior to the mass compatible with postobstructive pneumonitis. Moderate centrilobular emphysema. Small bilateral pleural effusions. Musculoskeletal: No chest wall mass or suspicious bone lesions identified. CT ABDOMEN PELVIS FINDINGS Hepatobiliary: Stable focal lucencies along segment 4B of the liver anteriorly with fluid attenuation, likely representing cysts. Gallstones. No intra or extrahepatic biliary ductal dilatation. Pancreas: Unremarkable. No pancreatic ductal dilatation or surrounding inflammatory changes. Spleen: Normal in size without focal abnormality. Adrenals/Urinary Tract: Previous identified left adrenal nodule  is poorly visualized due to motion artifact. Right kidney lower pole nonobstructing nephrolithiasis measuring 5 mm. No other kidney lesion. No hydronephrosis. Normal bladder with Foley catheter in situ. Stomach/Bowel:  Stomach is within normal limits. Appendix appears normal. No evidence of bowel wall thickening, distention, or inflammatory changes. Vascular/Lymphatic: Severe calcific atherosclerosis of the abdominal aorta with infrarenal aneurysm measuring up to 3.2 cm in diameter (series 6, image 77). No abdominal lymphadenopathy identified. Reproductive: Status post hysterectomy. No adnexal masses. Other: No abdominal wall hernia or abnormality. No abdominopelvic ascites. Musculoskeletal: No acute or significant osseous findings. IMPRESSION: 1. Left upper lobe mass is slightly increased in size measuring up to 4.9 cm. 2. Mediastinal lymph nodes now measure less than 1 cm short axis. No new adenopathy identified. 3. No distant metastasis identified. 4. Left upper lobe postobstructive pneumonitis. 5. Moderate centrilobular emphysema of the lungs and small bilateral pleural effusions. 6. Left adrenal nodule is poorly visualized due to motion artifact. 7. Severe calcific atherosclerosis of aorta. 3.2 cm infrarenal abdominal aortic aneurysm. Recommend followup by ultrasound in 3 years. This recommendation follows ACR consensus guidelines: White Paper of the ACR Incidental Findings Committee II on Vascular Findings. J Am Coll Radiol 2013; 10:789-794. Electronically Signed   By: Kristine Garbe [name]   On: 09/17/2016 00:53        Scheduled Meds: . chlorhexidine  15 mL Mouth Rinse BID  . heparin subcutaneous  5,000 Units Subcutaneous Q8H  . insulin aspart  0-9 Units Subcutaneous Q4H  . insulin glargine  15 Units Subcutaneous Daily  . ipratropium-albuterol  3 mL Nebulization TID  . mouth rinse  15 mL Mouth Rinse q12n4p  . methylPREDNISolone (SOLU-MEDROL) injection  40 mg Intravenous Q8H  . nystatin    Topical TID  . sodium chloride flush  3 mL Intravenous Q12H   Continuous Infusions: . ceFEPime (MAXIPIME) IV Stopped (09/17/16 0548)  . lactated ringers 50 mL/hr at 09/16/16 2000     LOS: 4 days    Time spent: 64 minutes    WOODS, Geraldo Docker, MD [facility] Pager [phone]   If 7PM-7AM, please contact night-coverage [email or web address] Password [facility] 09/17/2016, 10:25 AM

## 2016-09-17 NOTE — Procedures (Signed)
L thoracentesis 200 cc serosanguinous EBL 0 Comp 0

## 2016-09-17 NOTE — Progress Notes (Signed)
Took the pt down to radiology for thoracentesis. Given Ativan 1mg , tolerated well. Post X ray done. No concerns.  Gershon Crane

## 2016-09-17 NOTE — Progress Notes (Signed)
This RN removed 2 vials of 2 mg ativan. 1mg  from one vial administered, other unused vial of ativan returned to pyxis and medication discrepancy resolved with Charge nurse Loma Sousa and nurse Mendel Ryder. Pyxis did not account for vial of ativan that was used and administered by this RN, med wasted in sink and witnessed by [employer] and nurse Mendel Ryder, unable to document witness waste in pyxis, Pharmacist Katie informed of all events and will also document event and resolution.

## 2016-09-18 LAB — BLOOD GAS, ARTERIAL
Acid-Base Excess: 20.3 mmol/L — ABNORMAL HIGH (ref 0.0–2.0)
Bicarbonate: 46.6 mmol/L — ABNORMAL HIGH (ref 20.0–28.0)
DELIVERY SYSTEMS: POSITIVE
DRAWN BY: 25203
Expiratory PAP: 5
FIO2: 50
INSPIRATORY PAP: 20
LHR: 18 {breaths}/min
O2 SAT: 97.7 %
PATIENT TEMPERATURE: 98.6
PCO2 ART: 78.6 mmHg — AB (ref 32.0–48.0)
PH ART: 7.391 (ref 7.350–7.450)
pO2, Arterial: 103 mmHg (ref 83.0–108.0)

## 2016-09-18 LAB — BASIC METABOLIC PANEL
ANION GAP: 6 (ref 5–15)
BUN: 11 mg/dL (ref 6–20)
CO2: 42 mmol/L — ABNORMAL HIGH (ref 22–32)
Calcium: 8.9 mg/dL (ref 8.9–10.3)
Chloride: 90 mmol/L — ABNORMAL LOW (ref 101–111)
Creatinine, Ser: 0.56 mg/dL (ref 0.44–1.00)
GFR calc non Af Amer: >60 mL/min (ref >60)
Glucose, Bld: 211 mg/dL — ABNORMAL HIGH (ref 65–99)
POTASSIUM: 4.8 mmol/L (ref 3.5–5.1)
SODIUM: 138 mmol/L (ref 135–145)

## 2016-09-18 LAB — GLUCOSE, CAPILLARY
GLUCOSE-CAPILLARY: 204 mg/dL — AB (ref 65–99)
GLUCOSE-CAPILLARY: 151 mg/dL — AB (ref 65–99)
Glucose-Capillary: 201 mg/dL — ABNORMAL HIGH (ref 65–99)
Glucose-Capillary: 152 mg/dL — ABNORMAL HIGH (ref 65–99)
Glucose-Capillary: 183 mg/dL — ABNORMAL HIGH (ref 65–99)
Glucose-Capillary: 162 mg/dL — ABNORMAL HIGH (ref 65–99)

## 2016-09-18 LAB — CBC WITH DIFFERENTIAL/PLATELET
Basophils Absolute: 0 10*3/uL (ref 0.0–0.1)
Basophils Relative: 0 %
Eosinophils Absolute: 0 10*3/uL (ref 0.0–0.7)
Eosinophils Relative: 0 %
HEMATOCRIT: 37.5 % (ref 36.0–46.0)
Hemoglobin: 10.7 g/dL — ABNORMAL LOW (ref 12.0–15.0)
LYMPHS PCT: 5 %
Lymphs Abs: 0.3 10*3/uL — ABNORMAL LOW (ref 0.7–4.0)
MCH: 26.6 pg (ref 26.0–34.0)
MCHC: 28.5 g/dL — AB (ref 30.0–36.0)
MCV: 93.3 fL (ref 78.0–100.0)
MONO ABS: 0.4 10*3/uL (ref 0.1–1.0)
MONOS PCT: 6 %
NEUTROS ABS: 5.2 10*3/uL (ref 1.7–7.7)
Neutrophils Relative %: 89 %
Platelets: 152 10*3/uL (ref 150–400)
RBC: 4.02 MIL/uL (ref 3.87–5.11)
RDW: 14.8 % (ref 11.5–15.5)
WBC: 5.9 10*3/uL (ref 4.0–10.5)

## 2016-09-18 LAB — CULTURE, BLOOD (ROUTINE X 2)
Culture: NO GROWTH
Culture: NO GROWTH
SPECIAL REQUESTS: ADEQUATE
Special Requests: ADEQUATE

## 2016-09-18 LAB — MAGNESIUM: Magnesium: 2.1 mg/dL (ref 1.7–2.4)

## 2016-09-18 LAB — GRAM STAIN

## 2016-09-18 MED ORDER — ACETAMINOPHEN 325 MG PO TABS
650.0000 mg | ORAL_TABLET | Freq: Four times a day (QID) | ORAL | Status: DC | PRN
  Filled 2016-09-18: qty 2

## 2016-09-18 MED ORDER — METHYLPREDNISOLONE SODIUM SUCC 40 MG IJ SOLR: 40.0000 mg | Freq: Every day | INTRAMUSCULAR | Status: DC

## 2016-09-18 MED ORDER — GABAPENTIN 100 MG PO CAPS
100.0000 mg | ORAL_CAPSULE | Freq: Three times a day (TID) | ORAL | Status: DC
  Administered 2016-09-18 – 2016-09-19 (×4): 100 mg via ORAL
  Filled 2016-09-18 (×5): qty 1

## 2016-09-18 MED ORDER — ALBUTEROL SULFATE (2.5 MG/3ML) 0.083% IN NEBU
2.5000 mg | INHALATION_SOLUTION | RESPIRATORY_TRACT | Status: DC | PRN
  Administered 2016-09-19: 2.5 mg via RESPIRATORY_TRACT
  Filled 2016-09-18: qty 3

## 2016-09-18 MED ORDER — ATORVASTATIN CALCIUM 20 MG PO TABS
20.0000 mg | ORAL_TABLET | Freq: Every day | ORAL | Status: DC
  Administered 2016-09-18: 20 mg via ORAL
  Filled 2016-09-18: qty 1

## 2016-09-18 MED ORDER — PANTOPRAZOLE SODIUM 40 MG PO TBEC
40.0000 mg | DELAYED_RELEASE_TABLET | Freq: Every day | ORAL | Status: DC
  Administered 2016-09-19: 40 mg via ORAL
  Filled 2016-09-18: qty 1

## 2016-09-18 MED ORDER — MORPHINE SULFATE (PF) 2 MG/ML IV SOLN
1.0000 mg | INTRAVENOUS | Status: DC | PRN
  Administered 2016-09-18: 1 mg via INTRAVENOUS
  Filled 2016-09-18: qty 1

## 2016-09-18 MED ORDER — OXYCODONE HCL 5 MG PO TABS
5.0000 mg | ORAL_TABLET | ORAL | Status: DC | PRN
  Administered 2016-09-18: 5 mg via ORAL
  Filled 2016-09-18 (×2): qty 1

## 2016-09-18 MED ORDER — INSULIN ASPART 100 UNIT/ML ~~LOC~~ SOLN: 0.0000 [IU] | Freq: Every day | SUBCUTANEOUS | Status: DC

## 2016-09-18 MED ORDER — TRAMADOL HCL 50 MG PO TABS
50.0000 mg | ORAL_TABLET | Freq: Four times a day (QID) | ORAL | Status: DC | PRN
  Administered 2016-09-18 – 2016-09-19 (×2): 50 mg via ORAL
  Filled 2016-09-18 (×2): qty 1

## 2016-09-18 MED ORDER — INSULIN ASPART 100 UNIT/ML ~~LOC~~ SOLN
0.0000 [IU] | Freq: Three times a day (TID) | SUBCUTANEOUS | Status: DC
  Administered 2016-09-18 – 2016-09-19 (×3): 4 [IU] via SUBCUTANEOUS

## 2016-09-18 MED ORDER — INSULIN GLARGINE 100 UNIT/ML ~~LOC~~ SOLN
20.0000 [IU] | Freq: Every day | SUBCUTANEOUS | Status: DC
  Administered 2016-09-19: 20 [IU] via SUBCUTANEOUS
  Filled 2016-09-18: qty 0.2

## 2016-09-18 MED ORDER — ISOSORBIDE MONONITRATE ER 60 MG PO TB24
60.0000 mg | ORAL_TABLET | Freq: Every day | ORAL | Status: DC
  Administered 2016-09-18 – 2016-09-19 (×2): 60 mg via ORAL
  Filled 2016-09-18 (×2): qty 1

## 2016-09-18 NOTE — Progress Notes (Signed)
Inpatient Diabetes Program Recommendations  AACE/ADA: New Consensus Statement on Inpatient Glycemic Control (2015)  Target Ranges:  Prepandial:   less than 140 mg/dL      Peak postprandial:   less than 180 mg/dL (1-2 hours)      Critically ill patients:  140 - 180 mg/dL   Results for [name], [name] (MRN [id number]) as of 09/18/2016 10:30  Ref. Range 09/17/2016 05:19 09/17/2016 09:17 09/17/2016 11:26 09/17/2016 16:57 09/17/2016 19:51 09/18/2016 01:14 09/18/2016 03:51 09/18/2016 07:07  Glucose-Capillary Latest Ref Range: 65 - 99 mg/dL 165 (H) 142 (H) 226 (H) 277 (H) 280 (H) 204 (H) 201 (H) 152 (H)   Review of Glycemic Control  Current orders for Inpatient glycemic control: Lantus 20 units daily, Novolog 0-20 units TID with meals, Novolog 0-5 units QHS  Inpatient Diabetes Program Recommendations: Insulin - Meal Coverage: If steroids are continued, please consider ordering Novolog 4 units TID with meals for meal coverage if patient eats at least 50% of meals.  Thanks, Barnie Alderman, RN, MSN, CDE Diabetes Coordinator Inpatient Diabetes Program [phone] (Team Pager from 8am to 5pm)

## 2016-09-18 NOTE — Progress Notes (Signed)
[facility] TEAM 1 - Stepdown/ICU TEAM  [name]  UVO:[id number] DOB: 06-04-1955 DOA: 09/13/2016 PCP: Brand Males, MD    Brief Narrative:  61yo F w/ a Hx of chronic hypoxia on 5 L O2 at home, Chronic Diastolic CHF, CAD, CVA, DM2, Arthritis, and Anemia of Chronic disease who was discharged on 5/18 after an admission for acute respiratory failure due to a postobstructive PNA that required intubation for 5 days. 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 She was instructed to follow up with her Pulmonologist after D/C 5/23 but did not show up.  On 09/13/16 she called EMS to her home due to SOB w/ O2 sats <50% on RA. Upon arrival to the ER patient demonstrated symptomatic hypercarbic as well as hypoxemic respiratory failure.   Subjective: C/o severe low back pain.  Is alert and conversant but mildly confused.  Denies sob, cp, n/v, or abdom pain.  Has a very good appetite.    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 -S/P Left Thoracentesis: 200 mL turbid fluid - awaiting path -PCCM recommends EBUS if fluid nondiagnostic  Acute metabolic encephalopathy Slowly improving - CT head w/ contrast w/o evidence of mets   E coli UTI To complete a 7  day course of abx tx   Diabetes mellitus 2 CBG elevated - adjust tx and follow trend   Chronic Diastolic CHF   Is not on chronic diuretic - wgt essentially stable - net negative 2300 since admit   Filed Weights   09/16/16 0326 09/17/16 0500 09/18/16 0500  Weight: 83.4 kg (183 lb 13.8 oz) 84.4 kg (186 lb 1.1 oz) 84.7 kg (186 lb 11.7 oz)    Pulmonary HTN  Hx CAD  Candidal dermatitis Cont topical tx   Anemia of chronic disease Hgb stable presently   GERD  DVT prophylaxis:  SQ heparin  Code Status: DNR - NO CODE Family Communication: spoke w/ husband at bedside  Disposition Plan: transfer to med/surg bed - ambulate - PT/OT - await results of thora cytology to plan further tx/studies   Consultants:  PCCM Palliative Care   Procedures: 8/11 TTE - EF 55-60% - Basal inferior and inferolateral akinesis - Grade 2 diastolic dysfunction 7/42 S/P Left Thoracentesis - 200 mL turbid fluid   Antimicrobials:  Cefepime 8/8 > Vanc 8/8 > 8/10   Objective: Blood pressure (!) 104/51, pulse 83, temperature 98.2 F (36.8 C), temperature source Oral, resp. rate 14, weight 84.7 kg (186 lb 11.7 oz), SpO2 98 %.  Intake/Output Summary (Last 24 hours) at 09/18/16 0832 Last data filed at 09/18/16 5956  Gross per 24 hour  Intake             1900 ml  Output             1600 ml  Net  300 ml   Filed Weights   09/16/16 0326 09/17/16 0500 09/18/16 0500  Weight: 83.4 kg (183 lb 13.8 oz) 84.4 kg (186 lb 1.1 oz) 84.7 kg (186 lb 11.7 oz)    Examination: General: No acute respiratory distress  Lungs: Clear to auscultation bilaterally without wheezes or crackles Cardiovascular: Regular rate and rhythm without murmur gallop or rub normal S1 and S2 Abdomen: Nontender, nondistended, soft, bowel sounds positive, no rebound, no ascites, no appreciable mass Extremities: No significant cyanosis, clubbing, or edema bilateral lower extremities  CBC:  Recent Labs Lab 09/13/16 0248  09/14/16 0356 09/15/16 0627 09/16/16 0826 09/17/16 0414 09/18/16 0310  WBC 12.4* 10.1 6.8 6.5 8.6 5.9  NEUTROABS 10.5*  --  6.0 5.3 8.0* 5.2  HGB 11.8* 9.9* 10.4* 10.6* 10.7* 10.7*  HCT 44.3 36.5 37.3 37.0 36.6 37.5  MCV 101.1* 98.6 94.0 93.9 91.0 93.3  PLT 271 209 173 163 190 846   Basic Metabolic Panel:  Recent Labs Lab 09/14/16 2200 09/15/16 0627 09/16/16 0821 09/17/16 0414 09/18/16 0310  NA 142 142 138 134* 138  K 5.0 4.9 4.5 4.5 4.8  CL 89* 89* 86* 87* 90*  CO2 44* 43* 45* 38* 42*  GLUCOSE 176* 210* 178* 157* 211*  BUN 16 16 16 14 11   CREATININE 0.73 0.61 0.55 0.59 0.56  CALCIUM 9.0 9.1 9.1 8.7* 8.9  MG 2.4 2.2 2.2 2.0 2.1  PHOS  --  4.0 4.1 3.5  --    GFR: Estimated Creatinine Clearance: 80 mL/min (by C-G formula based on SCr of 0.56 mg/dL).  Liver Function Tests:  Recent Labs Lab 09/13/16 0248 09/14/16 0356 09/15/16 0627 09/16/16 0821 09/17/16 0414  AST 13* 13*  --   --   --   ALT 13* 12*  --   --   --   ALKPHOS 76 55  --   --   --   BILITOT 0.6 0.7  --   --   --   PROT 7.8 6.5  --   --   --   ALBUMIN 2.7* 2.3* 2.4* 2.2* 2.3*    HbA1C: Hgb A1c MFr Bld  Date/Time Value Ref Range Status  06/15/2016 12:00 PM 6.3 (H) 4.8 - 5.6 % Final    Comment:    (NOTE)         Pre-diabetes: 5.7 - 6.4         Diabetes: >6.4         Glycemic control for adults with diabetes: <7.0   12/16/2012 04:35 PM 9.2 (H) <5.7 % Final    Comment:    (NOTE)                                                                       According to the ADA Clinical Practice Recommendations for 2011, when HbA1c is used as a screening test:  >=6.5%   Diagnostic of Diabetes Mellitus           (if abnormal result is confirmed) 5.7-6.4%   Increased risk of developing Diabetes Mellitus References:Diagnosis and Classification of Diabetes Mellitus,Diabetes KZLD,3570,17(BLTJQ 1):S62-S69 and Standards of Medical Care in         Diabetes - 2011,Diabetes Care,2011,34 (Suppl 1):S11-S61.     CBG:  Recent Labs Lab 09/17/16 1657 09/17/16 1951 09/18/16 0114 09/18/16 0351 09/18/16 0707  GLUCAP 277* 280* 204* 201* 152*    Recent Results (from the past 240 hour(s))  MRSA PCR Screening     Status: None   Collection Time: 09/13/16  8:45 AM  Result Value Ref Range Status   MRSA by PCR NEGATIVE NEGATIVE Final    Comment:        The GeneXpert MRSA Assay (FDA approved for NASAL specimens only), is one component of a comprehensive MRSA colonization surveillance program. It is not intended to diagnose MRSA infection nor to guide or monitor treatment for MRSA infections.   Culture, Urine     Status: Abnormal   Collection Time: 09/13/16  9:44 AM  Result Value Ref Range Status   Specimen Description URINE, RANDOM  Final   Special Requests NONE  Final   Culture >=100,000 COLONIES/mL ESCHERICHIA COLI (A)  Final   Report Status 09/15/2016 FINAL  Final   Organism ID, Bacteria ESCHERICHIA COLI (A)  Final      Susceptibility   Escherichia coli - MIC*    AMPICILLIN >=32 RESISTANT Resistant     CEFAZOLIN 16 SENSITIVE Sensitive     CEFTRIAXONE <=1 SENSITIVE Sensitive     CIPROFLOXACIN <=0.25 SENSITIVE Sensitive     GENTAMICIN <=1 SENSITIVE Sensitive     IMIPENEM <=0.25 SENSITIVE Sensitive     NITROFURANTOIN <=16 SENSITIVE Sensitive     TRIMETH/SULFA <=20 SENSITIVE Sensitive     AMPICILLIN/SULBACTAM >=32 RESISTANT Resistant     PIP/TAZO 64 INTERMEDIATE Intermediate     Extended ESBL NEGATIVE Sensitive     * >=100,000 COLONIES/mL ESCHERICHIA COLI  Culture, blood (routine x 2) Call MD if unable to obtain prior to antibiotics being given     Status: None (Preliminary result)   Collection Time: 09/13/16  9:59 AM  Result Value Ref Range Status   Specimen Description BLOOD RIGHT ANTECUBITAL  Final   Special Requests   Final    BOTTLES DRAWN AEROBIC ONLY Blood Culture adequate volume   Culture NO GROWTH 4 DAYS  Final   Report Status PENDING  Incomplete  Culture, blood  (routine x 2) Call MD if unable to obtain prior to antibiotics being given     Status: None (Preliminary result)   Collection Time: 09/13/16 10:08 AM  Result Value Ref Range Status   Specimen Description BLOOD RIGHT HAND  Final   Special Requests   Final    BOTTLES DRAWN AEROBIC ONLY Blood Culture adequate volume   Culture NO GROWTH 4 DAYS  Final   Report Status PENDING  Incomplete  Respiratory Panel by PCR     Status: None   Collection Time: 09/15/16 12:43 PM  Result Value Ref Range Status   Adenovirus NOT DETECTED NOT DETECTED Final   Coronavirus 229E NOT DETECTED NOT DETECTED Final   Coronavirus HKU1 NOT DETECTED NOT DETECTED Final   Coronavirus NL63 NOT DETECTED NOT DETECTED Final   Coronavirus OC43 NOT DETECTED NOT DETECTED Final   Metapneumovirus NOT DETECTED NOT DETECTED Final   Rhinovirus / Enterovirus NOT DETECTED NOT DETECTED Final   Influenza A NOT DETECTED NOT DETECTED Final   Influenza A H1 NOT DETECTED NOT DETECTED Final   Influenza A H1 2009 NOT DETECTED NOT DETECTED Final   Influenza A H3 NOT DETECTED NOT DETECTED Final   Influenza B NOT DETECTED NOT DETECTED Final   Parainfluenza Virus 1 NOT DETECTED NOT DETECTED Final   Parainfluenza Virus 2 NOT  DETECTED NOT DETECTED Final   Parainfluenza Virus 3 NOT DETECTED NOT DETECTED Final   Parainfluenza Virus 4 NOT DETECTED NOT DETECTED Final   Respiratory Syncytial Virus NOT DETECTED NOT DETECTED Final   Bordetella pertussis NOT DETECTED NOT DETECTED Final   Chlamydophila pneumoniae NOT DETECTED NOT DETECTED Final   Mycoplasma pneumoniae NOT DETECTED NOT DETECTED Final  Culture, sputum-assessment     Status: None   Collection Time: 09/17/16  3:00 AM  Result Value Ref Range Status   Specimen Description SPUTUM  Final   Special Requests NONE  Final   Sputum evaluation THIS SPECIMEN IS ACCEPTABLE FOR SPUTUM CULTURE  Final   Report Status 09/17/2016 FINAL  Final  Culture, respiratory (NON-Expectorated)     Status: None  (Preliminary result)   Collection Time: 09/17/16  3:00 AM  Result Value Ref Range Status   Specimen Description SPUTUM  Final   Special Requests NONE Reflexed from W2006  Final   Gram Stain   Final    RARE WBC PRESENT,BOTH PMN AND MONONUCLEAR NO ORGANISMS SEEN    Culture PENDING  Incomplete   Report Status PENDING  Incomplete  Gram stain     Status: None   Collection Time: 09/17/16  1:41 PM  Result Value Ref Range Status   Specimen Description PLEURAL LEFT  Final   Special Requests NONE  Final   Gram Stain   Final    RARE WBC PRESENT, PREDOMINANTLY MONONUCLEAR NO ORGANISMS SEEN    Report Status 09/18/2016 FINAL  Final     Scheduled Meds: . chlorhexidine  15 mL Mouth Rinse BID  . heparin subcutaneous  5,000 Units Subcutaneous Q8H  . insulin aspart  0-15 Units Subcutaneous Q4H  . insulin glargine  18 Units Subcutaneous Daily  . ipratropium-albuterol  3 mL Nebulization TID  . mouth rinse  15 mL Mouth Rinse q12n4p  . methylPREDNISolone (SOLU-MEDROL) injection  40 mg Intravenous Q12H  . metoprolol tartrate  12.5 mg Oral BID  . nystatin   Topical TID  . pantoprazole  40 mg Oral BID  . sodium chloride flush  3 mL Intravenous Q12H     LOS: 5 days   Cherene Altes, MD [facility] Office  [phone] Pager - Text Page per Shea Evans as per below:  On-Call/Text Page:      [email or web address]      password [email or web address]  If 7PM-7AM, please contact night-coverage [email or web address] Password [email or web address] 09/18/2016, 8:32 AM

## 2016-09-18 NOTE — Progress Notes (Signed)
PULMONARY / CRITICAL CARE MEDICINE   Name: [name] MRN: [id number] DOB: 04/27/1955    ADMISSION DATE:  09/13/2016 CONSULTATION DATE:  09/13/16  REFERRING MD:  Dillon Bjork MD  CHIEF COMPLAINT:  Hypercarbic respiratory failure, lung mass  Brief patient summary:   61 y.o. with history of COPD on home O2, diabetes, coronary artery disease, heart failure, A. fib on Xarelto.. Admitted with hypercarbic respiratory failure. Initially admitted to stepdown unit on BiPAP. PCO2 improved initially but then she became more somnolent with worsening of ABG. PCCM consulted for help with further management. She has history of enlarging left hilar mass. She was supposed to follow-up at the pulmonary clinic but kept putting it off as she didn't want to deal with it. History is also notable for medical noncompliance with therapy.  SUBJECTIVE:   Off BIPAP No distress  VITAL SIGNS: BP 99/62   Pulse 79   Temp 98.2 F (36.8 C) (Oral)   Resp 15   Wt 84.7 kg (186 lb 11.7 oz)   SpO2 100%   BMI 30.14 kg/m   HEMODYNAMICS:    VENTILATOR SETTINGS:    INTAKE / OUTPUT: I/O last 3 completed shifts: In: 52 [P.O.:700; I.V.:1700; IV Piggyback:200] Out: 3350 [Urine:3350]  PHYSICAL EXAMINATION: General:  Adult female lying in bed in NAD, husband at bedside. HEENT: MM pink/dry, PERRL scleral anicteric. Neuro: sleepy, oriented to self, follows commands. CV: RRR, no M/R/G. PULM: even/non-labored, diminished in bases. GI: obese, soft, bs active.  Extremities: warm/dry, no edema.  Skin: no rashes, bruising to BUE.  LABS:  BMET  Recent Labs Lab 09/16/16 0821 09/17/16 0414 09/18/16 0310  NA 138 134* 138  K 4.5 4.5 4.8  CL 86* 87* 90*  CO2 45* 38* 42*  BUN 16 14 11   CREATININE 0.55 0.59 0.56  GLUCOSE 178* 157* 211*    Electrolytes  Recent Labs Lab 09/15/16 0627 09/16/16 0821 09/17/16 0414 09/18/16 0310  CALCIUM 9.1 9.1 8.7* 8.9  MG 2.2 2.2 2.0 2.1  PHOS 4.0 4.1 3.5  --      CBC  Recent Labs Lab 09/16/16 0826 09/17/16 0414 09/18/16 0310  WBC 6.5 8.6 5.9  HGB 10.6* 10.7* 10.7*  HCT 37.0 36.6 37.5  PLT 163 190 152    Coag's No results for input(s): APTT, INR in the last 168 hours.  Sepsis Markers  Recent Labs Lab 09/13/16 0959 09/15/16 0627 09/17/16 0414  PROCALCITON 0.10 <0.10 <0.10    ABG  Recent Labs Lab 09/14/16 0400 09/15/16 0320 09/16/16 0422  PHART 7.353 7.392 7.391  PCO2ART 100* 78.3* 78.6*  PO2ART 98.6 90.4 103    Liver Enzymes  Recent Labs Lab 09/13/16 0248 09/14/16 0356 09/15/16 0627 09/16/16 0821 09/17/16 0414  AST 13* 13*  --   --   --   ALT 13* 12*  --   --   --   ALKPHOS 76 55  --   --   --   BILITOT 0.6 0.7  --   --   --   ALBUMIN 2.7* 2.3* 2.4* 2.2* 2.3*    Cardiac Enzymes No results for input(s): TROPONINI, PROBNP in the last 168 hours.  Glucose  Recent Labs Lab 09/17/16 1126 09/17/16 1657 09/17/16 1951 09/18/16 0114 09/18/16 0351 09/18/16 0707  GLUCAP 226* 277* 280* 204* 201* 152*    Imaging Dg Chest 1 View  Result Date: 09/17/2016 CLINICAL DATA:  Post thoracentesis, diabetes mellitus, coronary disease post MI, CHF, COPD, LEFT lung mass EXAM: CHEST 1 VIEW  COMPARISON:  09/13/2016; correlation CT chest 09/16/2016 FINDINGS: Minimal enlargement of cardiac silhouette post median sternotomy and CABG. Atherosclerotic calcification aorta. Mediastinal contours and pulmonary vascularity normal. Again identified LEFT upper lobe mass in perihilar region 4.8 x 4.4 cm. Previous identified LEFT pleural effusion no longer identified. No pneumothorax. Underlying emphysematous changes. Bones demineralized. IMPRESSION: No pneumothorax following LEFT thoracentesis. Persistent large LEFT upper lobe mass. Electronically Signed   By: Lavonia Dana [name]   On: 09/17/2016 14:07   US Thoracentesis Asp Pleural Space W/img Guide  Result Date: 09/17/2016 INDICATION: Left pleural effusion EXAM: ULTRASOUND GUIDED LEFT  THORACENTESIS MEDICATIONS: None. COMPLICATIONS: None immediate. PROCEDURE: An ultrasound guided thoracentesis was thoroughly discussed with the patient and questions answered. The benefits, risks, alternatives and complications were also discussed. The patient understands and wishes to proceed with the procedure. Written consent was obtained. Ultrasound was performed to localize and mark an adequate pocket of fluid in the left chest. The area was then prepped and draped in the normal sterile fashion. 1% Lidocaine was used for local anesthesia. Under ultrasound guidance a 6 Fr Safe-T-Centesis catheter was introduced. Thoracentesis was performed. The catheter was removed and a dressing applied. FINDINGS: A total of approximately 200 cc of serosanguineous fluid was removed. Samples were sent to the laboratory as requested by the clinical team. IMPRESSION: Successful ultrasound guided left thoracentesis yielding 200 cc of pleural fluid. Electronically Signed   By: Marybelle Killings [name]   On: 09/17/2016 14:07    STUDIES:  CT scan 06/14/16- left upper lobe 4.7 centimeter mass in the left hilar area. Increase compared to October 2017. Has AP window and right paratracheal lymphadenopathy. Indeterminate left adrenal nodule. Left main and three-vessel coronary artery disease.  CULTURES: Bcx 8/8 >> Ucx 8/8 >> ecoli- pan sensitive Sputum   ANTIBIOTICS: Vanco 8/8 >> 8/10 Cefepime 8/8 >>  SIGNIFICANT EVENTS: Admit 8/8 8/12 > left thora, 200cc serosanguinous fluid removed  LINES/TUBES: PIV x 2 Foley 8/8 >>  DISCUSSION: 61 year old with COPD, chronic respiratory failure on oxygen, left lung mass setting for malignancy Admitted with hypercarbic respiratory failure. She is failing BiPAP and was transferred to the ICU for closer monitoring and likely intubation.  ASSESSMENT / PLAN:  Acute on Chronic Hypercarbic Respiratory Failure Acute on Chronic Hypoxic Respiratory Failure HCAP COPD with Acute  Exacerbation Left Lung Mass:  Enlarging Left pleural effusion - s/p thora 8/12 with 200cc serosanguinous fluid removed.  Cytology pending P:   Continue supplemental O2 to maintain SpO2 > 88% (would NOT aim for high 90's as will worsen VQ mismatch). Continue BiPAP PRN (has not needed). Continue cefepime through 8/14 for 7 days. Continue steroids (weaned to daily 8/13), BD's. Follow cytology. Pt and husband still deciding on whether they would want to pursue chemo etc if cytology positive (if negative, also unsure if would really want to undergo EBUS; they will discuss this today). CXR intermittently.  Rest per primary team.    FAMILY  - Updates: Husband updated at bedside. Pt and family have decided to list code status as DNR.  - Inter-disciplinary family meet or Palliative Care meeting due by:  8/15   Montey Hora, [place] Pager: [phone]  or [phone] 09/18/2016, 12:11 PM  STAFF NOTE: I, Merrie Roof, MD FACP have personally reviewed patient's available data, including medical history, events of note, physical examination and test results as part of my evaluation. I have discussed with resident/NP and other  care providers such as pharmacist, RN and RRT. In addition, I personally evaluated patient and elicited key findings of: no distress, speaking full sentences, reduced BS, abdo soft, limited edema, pcxr which I reviewed with mass, and copd, CT I reviewed with annular large mass LUL slight increased compared to prior, thora done cytology = PENDING, unsure if the yield from EBUS would be great, will discuss further with pt options of moving forward including no further investigation and symptom relief, would not be doing BX as inpt with exacerbation at this stage, PCT neg, no well defined infiltrate - dc al abx, to pred oral in am , I updated husband and pt in room   [employer]. Titus Mould, MD, [place] Pgr: [place]  [facility] 09/18/2016 1:26 PM

## 2016-09-19 DIAGNOSIS — B372 Candidiasis of skin and nail: Secondary | ICD-10-CM

## 2016-09-19 DIAGNOSIS — G9341 Metabolic encephalopathy: Secondary | ICD-10-CM

## 2016-09-19 DIAGNOSIS — E118 Type 2 diabetes mellitus with unspecified complications: Secondary | ICD-10-CM

## 2016-09-19 DIAGNOSIS — E872 Acidosis: Secondary | ICD-10-CM

## 2016-09-19 DIAGNOSIS — I5032 Chronic diastolic (congestive) heart failure: Secondary | ICD-10-CM

## 2016-09-19 DIAGNOSIS — J441 Chronic obstructive pulmonary disease with (acute) exacerbation: Secondary | ICD-10-CM

## 2016-09-19 LAB — GLUCOSE, CAPILLARY
Glucose-Capillary: 96 mg/dL (ref 65–99)
Glucose-Capillary: 155 mg/dL — ABNORMAL HIGH (ref 65–99)

## 2016-09-19 LAB — CULTURE, RESPIRATORY: CULTURE: NORMAL

## 2016-09-19 LAB — CULTURE, RESPIRATORY W GRAM STAIN

## 2016-09-19 MED ORDER — METHYLPREDNISOLONE SODIUM SUCC 40 MG IJ SOLR
40.0000 mg | Freq: Every day | INTRAMUSCULAR | Status: AC
  Administered 2016-09-19: 40 mg via INTRAVENOUS
  Filled 2016-09-19: qty 1

## 2016-09-19 MED ORDER — POTASSIUM CHLORIDE ER 20 MEQ PO TBCR: 20.0000 meq | EXTENDED_RELEASE_TABLET | Freq: Every day | ORAL | 0 refills | Status: AC | PRN

## 2016-09-19 MED ORDER — NYSTATIN 100000 UNIT/GM EX POWD: Freq: Three times a day (TID) | CUTANEOUS | 0 refills | Status: AC

## 2016-09-19 MED ORDER — IPRATROPIUM-ALBUTEROL 20-100 MCG/ACT IN AERS: 1.0000 | INHALATION_SPRAY | Freq: Four times a day (QID) | RESPIRATORY_TRACT | 1 refills | Status: AC

## 2016-09-19 MED ORDER — FUROSEMIDE 20 MG PO TABS: 20.0000 mg | ORAL_TABLET | Freq: Every day | ORAL | 11 refills | Status: DC

## 2016-09-19 MED ORDER — PREDNISONE 10 MG PO TABS: ORAL_TABLET | ORAL | 0 refills | Status: DC

## 2016-09-19 MED ORDER — POTASSIUM CHLORIDE ER 20 MEQ PO TBCR: 20.0000 meq | EXTENDED_RELEASE_TABLET | Freq: Every day | ORAL | 0 refills | Status: DC

## 2016-09-19 MED ORDER — METOPROLOL TARTRATE 25 MG PO TABS: 12.5000 mg | ORAL_TABLET | Freq: Two times a day (BID) | ORAL | 0 refills | Status: AC

## 2016-09-19 MED ORDER — PANTOPRAZOLE SODIUM 40 MG PO TBEC: 40.0000 mg | DELAYED_RELEASE_TABLET | Freq: Every day | ORAL | 0 refills | Status: AC

## 2016-09-19 MED ORDER — IPRATROPIUM-ALBUTEROL 0.5-2.5 (3) MG/3ML IN SOLN: 3.0000 mL | Freq: Two times a day (BID) | RESPIRATORY_TRACT | Status: DC

## 2016-09-19 MED ORDER — ALBUTEROL SULFATE (2.5 MG/3ML) 0.083% IN NEBU: 2.5000 mg | INHALATION_SOLUTION | RESPIRATORY_TRACT | 12 refills | Status: AC | PRN

## 2016-09-19 MED ORDER — PREDNISONE 20 MG PO TABS: 40.0000 mg | ORAL_TABLET | Freq: Every day | ORAL | Status: DC

## 2016-09-19 MED ORDER — FUROSEMIDE 20 MG PO TABS: 20.0000 mg | ORAL_TABLET | Freq: Every day | ORAL | 11 refills | Status: AC | PRN

## 2016-09-19 MED ORDER — IPRATROPIUM-ALBUTEROL 0.5-2.5 (3) MG/3ML IN SOLN: 3.0000 mL | RESPIRATORY_TRACT | 0 refills | Status: AC | PRN

## 2016-09-19 NOTE — Evaluation (Signed)
Occupational Therapy Evaluation Patient Details Name: [name] MRN: [id number] DOB: 07-19-1955 Today's Date: 09/19/2016    History of Present Illness 61 y.o. with history of COPD on home O2, diabetes, coronary artery disease, heart failure, A. fib on Xarelto.  Admitted with hypercarbic respiratory failure with known left hilar mass, now s/p thoracentesis 09/17/16.   Clinical Impression   Pt's husband assists her with bathing, dressing and supervises transfers to [facility]. Pt primarily stays in a recliner at home and is sedentary. Pt presents with impaired cognition, generalized weakness, decreased balance and baseline R shoulder deficits. Recommended pt consider a hospital bed, but she became somewhat agitated. She currently requires min assist for transfers and is on 3L 02. Will follow acutely. Recommending HHOT upon discharge.    Follow Up Recommendations  Home health OT;Supervision/Assistance - 24 hour    Equipment Recommendations  Hospital bed (pt may refuse)    Recommendations for Other Services       Precautions / Restrictions Precautions Precautions: Fall Restrictions Weight Bearing Restrictions: No      Mobility Bed Mobility Overal bed mobility: Needs Assistance Bed Mobility: Supine to Sit;Sit to Supine     Supine to sit: Min assist;HOB elevated Sit to supine: Mod assist;HOB elevated   General bed mobility comments: assist to raise trunk to sit EOB and for legs in bed to supine  Transfers Overall transfer level: Needs assistance Equipment used: 1 person hand held assist Transfers: Stand Pivot Transfers;Sit to/from Stand Sit to Stand: Min assist Stand pivot transfers: Min assist       General transfer comment: cues for technique, assist for balance/safety    Balance Overall balance assessment: Needs assistance Sitting-balance support: Feet unsupported Sitting balance-Leahy Scale: Good Sitting balance - Comments: sitting edge of bed without LE support able to  move trunk for reaching   Standing balance support: During functional activity;Single extremity supported Standing balance-Leahy Scale: Poor Standing balance comment: for stand step to chair/bed min A needed                           ADL either performed or assessed with clinical judgement   ADL Overall ADL's : Needs assistance/impaired Eating/Feeding: Set up;Bed level   Grooming: Wash/dry hands;Wash/dry face;Sitting;Supervision/safety;Set up   Upper Body Bathing: Moderate assistance;Sitting   Lower Body Bathing: Maximal assistance;Sit to/from stand   Upper Body Dressing : Sitting;Moderate assistance   Lower Body Dressing: Maximal assistance;Sit to/from stand   Toilet Transfer: Minimal Scientist, forensic Details (indicate cue type and reason): simulated to and from [place] and Hygiene: Maximal assistance;Sit to/from stand         General ADL Comments: Pt asking for a breathing treatment, RN notified.     Vision Patient Visual Report: No change from baseline       Perception     Praxis      Pertinent Vitals/Pain Pain Assessment: No/denies pain     Hand Dominance Right   Extremity/Trunk Assessment Upper Extremity Assessment Upper Extremity Assessment: Generalized weakness;RUE deficits/detail RUE Deficits / Details: R shoulder pain if overused per pt RUE: Unable to fully assess due to pain RUE Coordination: decreased gross motor   Lower Extremity Assessment Lower Extremity Assessment: Defer to PT evaluation RLE Deficits / Details: AROM WFL, strength hip flexion 3+/5, knee extension 4-/5, ankle DF 4-/5 RLE Sensation: history of peripheral neuropathy LLE Deficits / Details: AROM WFL, strength hip flexion 3+/5, knee extension 4-/5, ankle DF 4-/5  LLE Sensation: history of peripheral neuropathy       Communication Communication Communication: No difficulties   Cognition Arousal/Alertness:  Awake/alert Behavior During Therapy: Anxious;Agitated Overall Cognitive Status: Impaired/Different from baseline Area of Impairment: Problem solving;Safety/judgement                         Safety/Judgement: Decreased awareness of deficits   Problem Solving: Slow processing General Comments: husband report pt has episodes of confusion at home, became agitated when OT recommended hospital bed   General Comments  spouse in room and reports w/c too wide to get through doorway, wants transport chair; RN CM aware, issued handout for bumping w/c up stairs    Exercises     Shoulder Instructions      Home Living Family/patient expects to be discharged to:: Private residence Living Arrangements: Spouse/significant other Available Help at Discharge: Family;Available 24 hours/day Type of Home: House Home Access: Stairs to enter [employer] of Steps: 3 Entrance Stairs-Rails: Left Home Layout: One level     Bathroom Shower/Tub: Other (comment) (does not use shower)   Bathroom Toilet:  (uses 3 in 1 next to her recliner) Bathroom Accessibility: No   Home Equipment: Wheelchair - manual;Bedside commode (oxygen, usually on 5L)   Additional Comments: sleeps in recliner, uses BSC at home; sons and spouse assisting on stairs      Prior Functioning/Environment Level of Independence: Needs assistance  Gait / Transfers Assistance Needed: at times can transfer to [facility] on her own, sleeps in recliner, sons and spouse help in house up stairs ADL's / Homemaking Assistance Needed: assist from husband for sponge bath and dressing; reports can toilet on her own at times, self feeds and grooms with set up   Comments: pt appears disshelved with very long, dirty fingernails        OT Problem List: Decreased activity tolerance;Impaired balance (sitting and/or standing);Decreased coordination;Decreased cognition;Decreased safety awareness;Decreased knowledge of use of DME or  AE;Obesity;Impaired UE functional use;Decreased strength;Decreased range of motion      OT Treatment/Interventions: Self-care/ADL training;Patient/family education;Therapeutic activities;DME and/or AE instruction    OT Goals(Current goals can be found in the care plan section) Acute Rehab OT Goals Patient Stated Goal: To go home OT Goal Formulation: With patient Time For Goal Achievement: 10/03/16 Potential to Achieve Goals: Fair ADL Goals Pt Will Perform Grooming: with supervision;sitting Pt Will Perform Upper Body Bathing: with min assist;sitting Pt Will Perform Upper Body Dressing: sitting;with min assist Pt Will Transfer to Toilet: with min guard assist;stand pivot transfer;bedside commode Additional ADL Goal #1: Pt will perform tolerate sitting EOB x 10 minutes for ADL.  OT Frequency: Min 2X/week   Barriers to D/C:            Co-evaluation              AM-PAC PT "6 Clicks" Daily Activity     Outcome Measure Help from another person eating meals?: None Help from another person taking care of personal grooming?: A Little Help from another person toileting, which includes using toliet, bedpan, or urinal?: A Lot Help from another person bathing (including washing, rinsing, drying)?: A Lot Help from another person to put on and taking off regular upper body clothing?: A Little Help from another person to put on and taking off regular lower body clothing?: A Lot 6 Click Score: 16   End of Session Equipment Utilized During Treatment: Gait belt;Oxygen (3L)  Activity Tolerance: Patient limited by fatigue Patient  left: in bed;with call bell/phone within reach;with nursing/sitter in room;with family/visitor present  OT Visit Diagnosis: Unsteadiness on feet (R26.81);Muscle weakness (generalized) (M62.81);Other symptoms and signs involving cognitive function                Time: 1352-1407 OT Time Calculation (min): 15 min Charges:  OT General Charges $OT Visit: 1  Procedure OT Evaluation $OT Eval Moderate Complexity: 1 Procedure G-Codes:     Malka So 09/19/2016, 2:26 PM  [phone]

## 2016-09-19 NOTE — Evaluation (Signed)
3Physical Therapy Evaluation Patient Details Name: [name] MRN: [id number] DOB: 08-24-1955 Today's Date: 09/19/2016   History of Present Illness  61 y.o. with history of COPD on home O2, diabetes, coronary artery disease, heart failure, A. fib on Xarelto.  Admitted with hypercarbic respiratory failure with known left hilar mass, now s/p thoracentesis 09/17/16.  Clinical Impression  Patient presents with decreased mobility due to deficits listed in PT problem list.  She functioned at w/c level, but independent with transfers previously.  Currently needs min A to transfer safely.  Feel continued skilled PT in the acute setting indicated to allow d/c home with spouse assist and follow up HHPT at d/c.     Follow Up Recommendations Home health PT;Supervision/Assistance - 24 hour    Equipment Recommendations  Other (comment) (transport chair for home)    Recommendations for Other Services       Precautions / Restrictions Precautions Precautions: Fall Restrictions Weight Bearing Restrictions: No      Mobility  Bed Mobility Overal bed mobility: Needs Assistance Bed Mobility: Supine to Sit;Sit to Supine     Supine to sit: Min assist;HOB elevated Sit to supine: Mod assist;HOB elevated   General bed mobility comments: assist to lift trunk to sit EOB, then for legs in bed to supine  Transfers Overall transfer level: Needs assistance   Transfers: Stand Pivot Transfers;Sit to/from Stand Sit to Stand: Min assist Stand pivot transfers: Min assist       General transfer comment: cues for technique, assist for balance/safety  Ambulation/Gait                Stairs            Wheelchair Mobility    Modified Rankin (Stroke Patients Only)       Balance Overall balance assessment: Needs assistance Sitting-balance support: Feet unsupported Sitting balance-Leahy Scale: Good Sitting balance - Comments: sitting edge of bed without LE support able to move trunk for  reaching   Standing balance support: During functional activity;Single extremity supported Standing balance-Leahy Scale: Poor Standing balance comment: for stand step to chair/bed min A needed                             Pertinent Vitals/Pain Pain Assessment: No/denies pain    Home Living Family/patient expects to be discharged to:: Private residence Living Arrangements: Spouse/significant other Available Help at Discharge: Family;Available 24 hours/day Type of Home: House Home Access: Stairs to enter Entrance Stairs-Rails: Left Entrance Stairs-Number of Steps: 3 Home Layout: One level Home Equipment: Wheelchair - manual;Bedside commode (oxygen) Additional Comments: sleeps in recliner, uses BSC at home; sons and spouse assisting on stairs    Prior Function Level of Independence: Needs assistance   Gait / Transfers Assistance Needed: at times can transfer to [facility] on her own, sleeps in recliner, sons and spouse help in house up stairs  ADL's / Homemaking Assistance Needed: assist from husband for sponge bath; reports can toilet on her own at times        Hand Dominance   Dominant Hand: Right    Extremity/Trunk Assessment   Upper Extremity Assessment Upper Extremity Assessment: RUE deficits/detail RUE Deficits / Details: reports R shoulder pain with lots of movement and refuses to move for testing or combing hair on R side    Lower Extremity Assessment Lower Extremity Assessment: RLE deficits/detail;LLE deficits/detail RLE Deficits / Details: AROM WFL, strength hip flexion 3+/5, knee extension 4-/5, ankle  DF 4-/5 RLE Sensation: history of peripheral neuropathy LLE Deficits / Details: AROM WFL, strength hip flexion 3+/5, knee extension 4-/5, ankle DF 4-/5 LLE Sensation: history of peripheral neuropathy       Communication   Communication: No difficulties  Cognition Arousal/Alertness: Awake/alert Behavior During Therapy: WFL for tasks  assessed/performed Overall Cognitive Status: Within Functional Limits for tasks assessed                                        General Comments General comments (skin integrity, edema, etc.): spouse in room and reports w/c too wide to get through doorway, wants transport chair; RN CM aware, issued handout for bumping w/c up stairs    Exercises     Assessment/Plan    PT Assessment Patient needs continued PT services  PT Problem List Decreased strength;Decreased balance;Decreased activity tolerance;Decreased knowledge of use of DME;Cardiopulmonary status limiting activity;Decreased mobility       PT Treatment Interventions DME instruction;Therapeutic exercise;Patient/family education;Therapeutic activities;Balance training;Wheelchair mobility training;Functional mobility training    PT Goals (Current goals can be found in the Care Plan section)  Acute Rehab PT Goals Patient Stated Goal: To go home PT Goal Formulation: With patient/family Time For Goal Achievement: 09/26/16 Potential to Achieve Goals: Fair    Frequency Min 3X/week   Barriers to discharge        Co-evaluation               AM-PAC PT "6 Clicks" Daily Activity  Outcome Measure Difficulty turning over in bed (including adjusting bedclothes, sheets and blankets)?: A Little Difficulty moving from lying on back to sitting on the side of the bed? : Total Difficulty sitting down on and standing up from a chair with arms (e.g., wheelchair, bedside commode, etc,.)?: Total Help needed moving to and from a bed to chair (including a wheelchair)?: A Little Help needed walking in hospital room?: A Lot Help needed climbing 3-5 steps with a railing? : Total 6 Click Score: 11    End of Session Equipment Utilized During Treatment: Gait belt;Oxygen Activity Tolerance: Patient limited by fatigue Patient left: in bed;with call bell/phone within reach;with family/visitor present   PT Visit Diagnosis:  Muscle weakness (generalized) (M62.81);Other abnormalities of gait and mobility (R26.89)    Time: 1115-1140 PT Time Calculation (min) (ACUTE ONLY): 25 min   Charges:   PT Evaluation $PT Eval Moderate Complexity: 1 Mod PT Treatments $Therapeutic Activity: 8-22 mins   PT G CodesMagda Kiel, [place] [phone] 09/19/2016   Reginia Naas 09/19/2016, 12:00 PM

## 2016-09-19 NOTE — Progress Notes (Signed)
Danielle Rankin to be D/C'd to home per MD order.  Discussed with the patient and all questions fully answered.  VSS, Skin clean, dry and intact without evidence of skin break down, no evidence of skin tears noted. IV catheter discontinued intact. Site without signs and symptoms of complications. Dressing and pressure applied.  An After Visit Summary was printed and given to the patient. Patient received prescription.  D/c education completed with patient/family including follow up instructions, medication list, d/c activities limitations if indicated, with other d/c instructions as indicated by MD - patient able to verbalize understanding, all questions fully answered.   Patient instructed to return to ED, call 911, or call MD for any changes in condition.   Patient escorted via [place], and D/C home via private auto.  Morley Kos Price 09/19/2016 5:07 PM

## 2016-09-19 NOTE — Progress Notes (Signed)
PULMONARY / CRITICAL CARE MEDICINE   Name: [name] MRN: [id number] DOB: 12/03/1955    ADMISSION DATE:  09/13/2016 CONSULTATION DATE:  09/13/16  REFERRING MD:  Dillon Bjork MD  CHIEF COMPLAINT:  Hypercarbic respiratory failure, lung mass  Brief patient summary:   61 y.o. with history of COPD on home O2, diabetes, coronary artery disease, heart failure, A. fib on Xarelto.. Admitted with hypercarbic respiratory failure. Initially admitted to stepdown unit on BiPAP. PCO2 improved initially but then she became more somnolent with worsening of ABG. PCCM consulted for help with further management. She has history of enlarging left hilar mass. She was supposed to follow-up at the pulmonary clinic but kept putting it off as she didn't want to deal with it. History is also notable for medical noncompliance with therapy.  SUBJECTIVE:   Eating breakfast, no complaints.  VITAL SIGNS: BP (!) 102/54 (BP Location: Left Arm)   Pulse 75   Temp 98.2 F (36.8 C) (Oral)   Resp 16   Ht 5\' 6"  (1.676 m)   Wt 189 lb 9.5 oz (86 kg)   SpO2 96%   BMI 30.60 kg/m   HEMODYNAMICS:    VENTILATOR SETTINGS: FiO2 (%):  [35 %] 35 %  INTAKE / OUTPUT: I/O last 3 completed shifts: In: 806.3 [I.V.:706.3; IV Piggyback:100] Out: 1272 [Urine:1272]  PHYSICAL EXAMINATION: General:  Adult female sitting up in bed in NAD, husband at bedside. HEENT: MM pink/dry, PERRL scleral anicteric. Neuro: Alert, oriented, MAE CV: RRR, no M/R/G. PULM: even/non-labored, diminished in bases, no wheeze, on 5L [place] at 97% GI: obese, soft, bs active.  Extremities: warm/dry, no edema.  Skin: no rashes, bruising to BUE.  LABS:  BMET  Recent Labs Lab 09/16/16 0821 09/17/16 0414 09/18/16 0310  NA 138 134* 138  K 4.5 4.5 4.8  CL 86* 87* 90*  CO2 45* 38* 42*  BUN 16 14 11   CREATININE 0.55 0.59 0.56  GLUCOSE 178* 157* 211*    Electrolytes  Recent Labs Lab 09/15/16 0627 09/16/16 0821 09/17/16 0414 09/18/16 0310   CALCIUM 9.1 9.1 8.7* 8.9  MG 2.2 2.2 2.0 2.1  PHOS 4.0 4.1 3.5  --     CBC  Recent Labs Lab 09/16/16 0826 09/17/16 0414 09/18/16 0310  WBC 6.5 8.6 5.9  HGB 10.6* 10.7* 10.7*  HCT 37.0 36.6 37.5  PLT 163 190 152    Coag's No results for input(s): APTT, INR in the last 168 hours.  Sepsis Markers  Recent Labs Lab 09/13/16 0959 09/15/16 0627 09/17/16 0414  PROCALCITON 0.10 <0.10 <0.10    ABG  Recent Labs Lab 09/14/16 0400 09/15/16 0320 09/16/16 0422  PHART 7.353 7.392 7.391  PCO2ART 100* 78.3* 78.6*  PO2ART 98.6 90.4 103    Liver Enzymes  Recent Labs Lab 09/13/16 0248 09/14/16 0356 09/15/16 0627 09/16/16 0821 09/17/16 0414  AST 13* 13*  --   --   --   ALT 13* 12*  --   --   --   ALKPHOS 76 55  --   --   --   BILITOT 0.6 0.7  --   --   --   ALBUMIN 2.7* 2.3* 2.4* 2.2* 2.3*    Cardiac Enzymes No results for input(s): TROPONINI, PROBNP in the last 168 hours.  Glucose  Recent Labs Lab 09/18/16 0351 09/18/16 0707 09/18/16 1213 09/18/16 1537 09/18/16 1913 09/19/16 0800  GLUCAP 201* 152* 183* 162* 151* 96    Imaging No results found.  STUDIES:  CT  scan 06/14/16- left upper lobe 4.7 centimeter mass in the left hilar area. Increase compared to October 2017. Has AP window and right paratracheal lymphadenopathy. Indeterminate left adrenal nodule. Left main and three-vessel coronary artery disease.  CULTURES: Bcx 8/8 >> neg Ucx 8/8 >> ecoli- pan sensitive Sputum  8/12 Left pleural GS >> neg 8/12 left pleural cx >> 8/12 sputum >>  ANTIBIOTICS: Vanco 8/8 >> 8/10 Cefepime 8/8 >> 8/13  SIGNIFICANT EVENTS: Admit 8/8 8/12 > left thora, 200cc serosanguinous fluid removed 8/13 tx to floor  LINES/TUBES: PIV x 2 Foley 8/8 >>  DISCUSSION: 61 year old with COPD, chronic respiratory failure on oxygen, left lung mass setting for malignancy Admitted with hypercarbic respiratory failure.  Tx to ICU for closer monitoring and BiPAP.  ASSESSMENT  / PLAN:  Acute on Chronic Hypercarbic Respiratory Failure Acute on Chronic Hypoxic Respiratory Failure HCAP COPD with Acute Exacerbation Left Lung Mass:  Enlarging Left pleural effusion - s/p thora 8/12 with 200cc serosanguinous fluid removed.  Cytology pending P:   - Continue supplemental O2 to maintain SpO2 > 88 -94% (would NOT aim for high 90's as will worsen VQ mismatch). - Change to steroids to PO 8/15 (tapered to 40mg  daily 8/13) - Pending cytology results from thoracentesis.  Patient states that regardless of results, she has made up her mind and would not be interested in any further treatment.  Husband thinks she should at least hear results, evaluate options and go from there.    - If cytology is positive- patient is not a candidate for inpatient biopsy or procedure w/acute exacerbation. Due to location of mass, EBUS unlikely, would likely need ENB, but again she would be very high risk for this procedure. - PT consult for home health recs.  Patient currently refusing to go a rehab facility.  Wants to go home w/care by husband. She has had progressive immobility over the last year secondary 2/2 DOE and was using a wheelchair prior to admit.  Recommendations for home: - continue predisone taper 40 mg daily for 2 more days, then on 8/17 start 30mg  daily x 4 days, then 20mg  x 4 days, then 10mg  daily x 4 days then stop.  - Continue either duoneb neb or combivent on scheduled basis, QID, but not PRN.  Use albuterol for PRN/ rescue.   - Pulmonary follow-up scheduled for 8/30 at 0930 with Eric Form, NP, to arrive 15 mins early for paperwork .  Will need to be placed on ICS at that time, after completion of pred taper or consider other combinations to faciliate patient compliance.   - Continue home O2 on [place], will goal sat 88-94%   Rest per primary team.   FAMILY  - Updates: Husband updated at bedside 8/14. Pt and family have decided to list code status as DNR.  - Inter-disciplinary  family meet or Palliative Care meeting due by:  8/17   Kennieth Rad, AGACNP-BC [place] Pgr: [phone] or if no answer [phone] 09/19/2016, 9:28 AM  Attending Note:  I have examined patient, reviewed labs, studies and notes. I have discussed the case with B Simpson, and I agree with the data and plans as amended above. 61 yo woman with COPD, enlarging LUL mass consistent w malignancy. Admitted with acute on chronic failure in setting AE-COPD +/- PNA. She underwent L thoracentesis, pleural fluid shows mesothelial cells, no evidence malignancy. I underscored with her that I believe the LUL mass is cancer, that she will need further workup >  either navigational FOB, TTNA (risky given location), possibly a repeat thora if the fluid reaccumulates (would increase sensitivity). She is upset by this, does not want any procedure if she can help it. I have asked her to discuss the options with her husband. She also wants to review options with Dr Sherral Hammers before she goes to get his thoughts. She has f/u visit with [place], and I stressed the importance of attending. Please see pred taper and BD recs as above. She will likely ultimately need an ICS, but don't want to complicate the regimen right now. Scheduled albuterol / atrovent + prn albuterol will be the plan for discharge.   Baltazar Apo, MD, PhD 09/19/2016, 1:34 PM [facility] [phone] or if no answer [phone]

## 2016-09-19 NOTE — Discharge Summary (Signed)
Physician Discharge Summary  [name] YBO:[id number] DOB: September 16, 1955 DOA: 09/13/2016  PCP: Brand Males, MD  Admit date: 09/13/2016 Discharge date: 09/19/2016  Admitted From: HOME Disposition:  home   Recommendations for Outpatient Follow-up:  1. F/u with pulm for further work up of lung mass- if she does not want work up, begin discussion about palliative care 2. Prescribed lasix and KCL PRN fluid gain   Home Health:  Home health  Discharge Condition:  stable   CODE STATUS:  DNR   Consultations:  PCCM    Discharge Diagnoses:  Principal Problem:   Acute on chronic respiratory failure with hypoxia and hypercapnia (HCC) Active Problems:   Mass of left lung   Diabetes mellitus with complication (HCC)   COPD exacerbation (HCC)   Chronic diastolic heart failure (HCC)   Candidal dermatitis   Acute metabolic encephalopathy   Anemia, chronic disease   UTI (urinary tract infection)   CO2 retention   Palliative care by specialist    Brief Narrative:  [name] 61 y.o.with history of COPD on home O2, diabetes, coronary artery disease, heart failure, A. fib on Xarelto.. Admitted with hypercarbic respiratory failure. Initially admitted to stepdown unit on BiPAP. PCO2 improved initially but then she became more somnolent with worsening of ABG. Patient was hospitalized in 5/9- 5/18 for acute respiratory failure that required intubation for 5 days. 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 Patient put off the work up.  8/11 CT chest shows increased size of left lung mass, bilateral pleural effusion. 8/12 S/P Left Thoracentesis: 200 mL turbid/ bloody fluid awaiting pathology report.  8/9 Palliative care consult due to repeat admissions for resp failure, missed pulmonary  appts. Now DNR.  8/11: ECHO - Grade 2 dCHF Current being treated for HCAP and E coli UTI- today will complete 7 days- will d/c Cefepime today.  Subjective: No complaints today.  ROS: no complaints of nausea, vomiting, constipation diarrhea, cough, dyspnea or dysuria. No other complaints.   Assessment & Plan:   Principal Problem:   Acute on chronic respiratory failure with hypoxia and hypercapnia  (A) COPD- severe - cont Home O2, Prednisone taper as written by pulm- 40 mg daily for 2 more days,  on 8/17 start 30mg  daily x 4 days, then 20mg  x 4 days, then 10mg  daily x 4 days then stop.  - f/u on 8/30 - Pulse ox should not go > 92% due to hypercarbia  (B)  Mass of left lung - pleural fluid cytology >> only mesothelial cells- f/u with pulm regarding a repeat thoracentesis- discussed by Dr Lamonte Sakai today.  (C) HCAP - has finished 7 days of antibiotics - resp viral panel and resp culture negative    Acute metabolic encephalopathy - CT head negative - resolved   E coli UTI - finished 7 days of Cefepime    Diabetes mellitus with complication  - husband gives her insulin - Novolog and Tresiba- can continue     Chronic diastolic heart failure and mild RV failure - Grade 2 d CHF- daily weights- will order PRN Lasix and KCL    Candidal dermatitis - under breasts- Nystatin   3.2 cm infrarenal abdominal aortic aneurysm - Recommend followup by ultrasound in 3 years.   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 There was moderate   concentric hypertrophy. Systolic function was normal. The   estimated ejection fraction was in the range of 55% to 60%. Basal   inferior and inferolateral akinesis. Features are consistent with   a pseudonormal left ventricular filling pattern, with concomitant   abnormal relaxation and increased filling pressure (grade 2   diastolic dysfunction). Doppler parameters are consistent  with   elevated ventricular end-diastolic filling pressure. - Aortic valve: Valve mobility was restricted. There was no   regurgitation. - Aortic root: The aortic root was normal in size. - Left atrium: The atrium was moderately dilated. - Right ventricle: The cavity size was moderately dilated. Wall   thickness was normal. Systolic function was mildly reduced. - Tricuspid valve: There was trivial regurgitation. - Pulmonary arteries: Systolic pressure was within the normal   range. - Inferior vena cava: The vessel was dilated. The respirophasic   diameter changes were blunted (< 50%), consistent with elevated   central venous pressure. - Pericardium, extracardiac: There was no pericardial effusion.  Discharge Instructions  Discharge Instructions    (HEART FAILURE PATIENTS) Call MD:  Anytime you have any of the following symptoms: 1) 3 pound weight gain in 24 hours or 5 pounds in 1 week 2) shortness of breath, with or without a dry hacking cough 3) swelling in the hands, feet or stomach 4) if you have to sleep on extra pillows at night in order to breathe.    Complete by:  As directed    Diet - low sodium heart healthy    Complete by:  As directed    Diet Carb Modified    Complete by:  As directed    Discharge instructions    Complete by:  As directed    DO NOT ALLOW OXYGEN LEVEL TO BE > 92%   Discharge instructions    Complete by:  As directed    Take 20 mg of Furosemide if your weight goes up by 3 lb in 24 hrs. Decrease fluid intake if this occurs. Take potasium with it. Call your doctor if weight does not come down.   Increase activity slowly    Complete by:  As directed      Allergies as of 09/19/2016      Reactions   Ciprofloxacin Rash   REACTION: hives/rash      Medication List    TAKE these medications   albuterol (2.5 MG/3ML) 0.083% nebulizer solution Commonly known as:  PROVENTIL Take 3 mLs (2.5 mg total) by nebulization every 2 (two) hours as needed for  wheezing.   atorvastatin 20 MG tablet Commonly known as:  LIPITOR Take 1 tablet (20 mg total) by mouth daily.   furosemide 20 MG tablet Commonly known as:  LASIX Take 1 tablet (20 mg total) by mouth daily as needed.   gabapentin 100 MG capsule Commonly known as:  NEURONTIN Take 100 mg by mouth 3 (three) times daily.   HYDROcodone-acetaminophen 5-325 MG tablet Commonly known as:  NORCO/VICODIN Take 1-2 tablets by mouth every 6 (six) hours as needed for moderate pain.   insulin aspart 100 UNIT/ML FlexPen Commonly known as:  NOVOLOG FLEXPEN Inject 6 Units into the skin 3 (three) times daily with meals. What changed:  how much to take  when to take this   ipratropium-albuterol 0.5-2.5 (3) MG/3ML Soln Commonly known as:  DUONEB Take 3 mLs by nebulization every 4 (four) hours as needed. What changed:  reasons to take this   Ipratropium-Albuterol 20-100 MCG/ACT Aers respimat Commonly known as:  COMBIVENT RESPIMAT Inhale 1-2 puffs into the lungs 4 (four) times daily. What changed:  when to take this  reasons to take this   isosorbide mononitrate 60 MG 24 hr tablet Commonly known as:  IMDUR Take 60 mg by mouth daily.   metoprolol tartrate 25 MG tablet Commonly known as:  LOPRESSOR Take 0.5 tablets (12.5 mg total) by mouth 2 (two) times daily. What changed:  medication strength  how much to take   nystatin powder Commonly known as:  MYCOSTATIN/NYSTOP Apply topically 3 (three) times daily.   pantoprazole 40 MG tablet Commonly known as:  PROTONIX Take 1 tablet (40 mg total) by mouth daily.   Potassium Chloride ER 20 MEQ Tbcr Take 20 mEq by mouth daily as needed.   predniSONE 10 MG tablet Commonly known as:  DELTASONE 40 mg x 2 days 30 mg x 4 days 20 mg x 4 days 10 mg x 4 days What changed:  medication strength  additional instructions   rivaroxaban 20 MG Tabs tablet Commonly known as:  XARELTO Take 1 tablet (20 mg total) by mouth daily.   TRESIBA  FLEXTOUCH 100 UNIT/ML Sopn FlexTouch Pen Generic drug:  insulin degludec Inject 20 Units into the skin daily.      Follow-up Information    Magdalen Spatz, NP Follow up on 10/05/2016.   Specialty:  Pulmonary Disease Why:  Appt at 9:30 am Please arrive 15 mins early for paperwork. Contact information: [address] [place] [address] [phone]          Allergies  Allergen Reactions  . Ciprofloxacin Rash    REACTION: hives/rash     Procedures/Studies:    Dg Chest 1 View  Result Date: 09/17/2016 CLINICAL DATA:  Post thoracentesis, diabetes mellitus, coronary disease post MI, CHF, COPD, LEFT lung mass EXAM: CHEST 1 VIEW COMPARISON:  09/13/2016; correlation CT chest 09/16/2016 FINDINGS: Minimal enlargement of cardiac silhouette post median sternotomy and CABG. Atherosclerotic calcification aorta. Mediastinal contours and pulmonary vascularity normal. Again identified LEFT upper lobe mass in perihilar region 4.8 x 4.4 cm. Previous identified LEFT pleural effusion no longer identified. No pneumothorax. Underlying emphysematous changes. Bones demineralized. IMPRESSION: No pneumothorax following LEFT thoracentesis. Persistent large LEFT upper lobe mass. Electronically Signed   By: Lavonia Dana [name]   On: 09/17/2016 14:07   Ct Head W & Wo Contrast  Result Date: 09/16/2016 CLINICAL DATA:  61 y/o F; evaluate for intracranial metastatic disease. EXAM: CT HEAD WITHOUT AND WITH CONTRAST TECHNIQUE: Contiguous axial images were obtained from the base of the skull through the vertex without and with intravenous contrast CONTRAST:  100 cc Isovue-300 COMPARISON:  08/08/2005 CT head FINDINGS: Brain: No evidence of acute infarction, hemorrhage, hydrocephalus, extra-axial collection or mass lesion/mass effect. No abnormal enhancement of the brain. Punctate nonspecific calcified focus in right frontal subarachnoid space of unlikely significance. Vascular: Calcific atherosclerosis of  carotid siphons. Visible vessels are patent. Skull: Normal. Negative for fracture or focal lesion. Sinuses/Orbits: No acute finding. Other: None. IMPRESSION: No intracranial metastatic disease identified. Unremarkable CT head. Electronically Signed   By: Kristine Garbe [name]   On: 09/16/2016 22:23   Ct Chest W Contrast  Result Date: 09/17/2016 CLINICAL DATA:  61 y/o  F; lung mass for follow-up. EXAM: CT CHEST, ABDOMEN, AND PELVIS WITH CONTRAST TECHNIQUE: Multidetector CT imaging of the chest, abdomen and pelvis was performed following the standard protocol  during bolus administration of intravenous contrast. CONTRAST:  151mL ISOVUE-300 IOPAMIDOL (ISOVUE-300) INJECTION 61% COMPARISON:  06/14/2016 CT chest FINDINGS: CT CHEST FINDINGS Cardiovascular: Mild cardiomegaly. Status post CABG. Normal caliber thoracic aorta with moderate calcification. Mediastinum/Nodes: AP window and right paratracheal lymph nodes now measure less than 1 cm short axis. No new lymphadenopathy is identified. Lungs/Pleura: Central left upper lobe mass is slightly increased in size measuring a 4.5 x 4.9 x 4.1 cm (AP x ML x CC series 4, image 16 and series 7, image 58). Stable clustered nodules within the right upper lobe superior and anterior to the mass compatible with postobstructive pneumonitis. Moderate centrilobular emphysema. Small bilateral pleural effusions. Musculoskeletal: No chest wall mass or suspicious bone lesions identified. CT ABDOMEN PELVIS FINDINGS Hepatobiliary: Stable focal lucencies along segment 4B of the liver anteriorly with fluid attenuation, likely representing cysts. Gallstones. No intra or extrahepatic biliary ductal dilatation. Pancreas: Unremarkable. No pancreatic ductal dilatation or surrounding inflammatory changes. Spleen: Normal in size without focal abnormality. Adrenals/Urinary Tract: Previous identified left adrenal nodule is poorly visualized due to motion artifact. Right kidney lower pole  nonobstructing nephrolithiasis measuring 5 mm. No other kidney lesion. No hydronephrosis. Normal bladder with Foley catheter in situ. Stomach/Bowel: Stomach is within normal limits. Appendix appears normal. No evidence of bowel wall thickening, distention, or inflammatory changes. Vascular/Lymphatic: Severe calcific atherosclerosis of the abdominal aorta with infrarenal aneurysm measuring up to 3.2 cm in diameter (series 6, image 77). No abdominal lymphadenopathy identified. Reproductive: Status post hysterectomy. No adnexal masses. Other: No abdominal wall hernia or abnormality. No abdominopelvic ascites. Musculoskeletal: No acute or significant osseous findings. IMPRESSION: 1. Left upper lobe mass is slightly increased in size measuring up to 4.9 cm. 2. Mediastinal lymph nodes now measure less than 1 cm short axis. No new adenopathy identified. 3. No distant metastasis identified. 4. Left upper lobe postobstructive pneumonitis. 5. Moderate centrilobular emphysema of the lungs and small bilateral pleural effusions. 6. Left adrenal nodule is poorly visualized due to motion artifact. 7. Severe calcific atherosclerosis of aorta. 3.2 cm infrarenal abdominal aortic aneurysm. Recommend followup by ultrasound in 3 years. This recommendation follows ACR consensus guidelines: White Paper of the ACR Incidental Findings Committee II on Vascular Findings. J Am Coll Radiol 2013; 10:789-794. Electronically Signed   By: Kristine Garbe [name]   On: 09/17/2016 00:53   Ct Abdomen Pelvis W Contrast  Result Date: 09/17/2016 CLINICAL DATA:  61 y/o  F; lung mass for follow-up. EXAM: CT CHEST, ABDOMEN, AND PELVIS WITH CONTRAST TECHNIQUE: Multidetector CT imaging of the chest, abdomen and pelvis was performed following the standard protocol during bolus administration of intravenous contrast. CONTRAST:  145mL ISOVUE-300 IOPAMIDOL (ISOVUE-300) INJECTION 61% COMPARISON:  06/14/2016 CT chest FINDINGS: CT CHEST FINDINGS  Cardiovascular: Mild cardiomegaly. Status post CABG. Normal caliber thoracic aorta with moderate calcification. Mediastinum/Nodes: AP window and right paratracheal lymph nodes now measure less than 1 cm short axis. No new lymphadenopathy is identified. Lungs/Pleura: Central left upper lobe mass is slightly increased in size measuring a 4.5 x 4.9 x 4.1 cm (AP x ML x CC series 4, image 16 and series 7, image 58). Stable clustered nodules within the right upper lobe superior and anterior to the mass compatible with postobstructive pneumonitis. Moderate centrilobular emphysema. Small bilateral pleural effusions. Musculoskeletal: No chest wall mass or suspicious bone lesions identified. CT ABDOMEN PELVIS FINDINGS Hepatobiliary: Stable focal lucencies along segment 4B of the liver anteriorly with fluid attenuation, likely representing cysts. Gallstones. No intra or extrahepatic biliary ductal  dilatation. Pancreas: Unremarkable. No pancreatic ductal dilatation or surrounding inflammatory changes. Spleen: Normal in size without focal abnormality. Adrenals/Urinary Tract: Previous identified left adrenal nodule is poorly visualized due to motion artifact. Right kidney lower pole nonobstructing nephrolithiasis measuring 5 mm. No other kidney lesion. No hydronephrosis. Normal bladder with Foley catheter in situ. Stomach/Bowel: Stomach is within normal limits. Appendix appears normal. No evidence of bowel wall thickening, distention, or inflammatory changes. Vascular/Lymphatic: Severe calcific atherosclerosis of the abdominal aorta with infrarenal aneurysm measuring up to 3.2 cm in diameter (series 6, image 77). No abdominal lymphadenopathy identified. Reproductive: Status post hysterectomy. No adnexal masses. Other: No abdominal wall hernia or abnormality. No abdominopelvic ascites. Musculoskeletal: No acute or significant osseous findings. IMPRESSION: 1. Left upper lobe mass is slightly increased in size measuring up to 4.9  cm. 2. Mediastinal lymph nodes now measure less than 1 cm short axis. No new adenopathy identified. 3. No distant metastasis identified. 4. Left upper lobe postobstructive pneumonitis. 5. Moderate centrilobular emphysema of the lungs and small bilateral pleural effusions. 6. Left adrenal nodule is poorly visualized due to motion artifact. 7. Severe calcific atherosclerosis of aorta. 3.2 cm infrarenal abdominal aortic aneurysm. Recommend followup by ultrasound in 3 years. This recommendation follows ACR consensus guidelines: White Paper of the ACR Incidental Findings Committee II on Vascular Findings. J Am Coll Radiol 2013; 10:789-794. Electronically Signed   By: Kristine Garbe [name]   On: 09/17/2016 00:53   Dg Chest Portable 1 View  Result Date: 09/13/2016 CLINICAL DATA:  Acute onset of respiratory distress. Initial encounter. EXAM: PORTABLE CHEST 1 VIEW COMPARISON:  CT of the chest performed 06/14/2016, and chest radiograph performed 06/21/2016 FINDINGS: The lungs are well-aerated. A large 6.4 cm mass is noted at the left hilum. Vascular congestion is noted. Bibasilar atelectasis is noted. No definite pleural effusion or pneumothorax is seen. The cardiomediastinal silhouette is mildly enlarged. The patient is status post median sternotomy. No acute osseous abnormalities are seen. IMPRESSION: 1. Large 6.4 cm mass at the left hilum. 2. Vascular congestion and mild cardiomegaly. 3. Bibasilar atelectasis noted. Electronically Signed   By: Garald Balding [name]   On: 09/13/2016 03:08   US Thoracentesis Asp Pleural Space W/img Guide  Result Date: 09/17/2016 INDICATION: Left pleural effusion EXAM: ULTRASOUND GUIDED LEFT THORACENTESIS MEDICATIONS: None. COMPLICATIONS: None immediate. PROCEDURE: An ultrasound guided thoracentesis was thoroughly discussed with the patient and questions answered. The benefits, risks, alternatives and complications were also discussed. The patient understands and wishes to  proceed with the procedure. Written consent was obtained. Ultrasound was performed to localize and mark an adequate pocket of fluid in the left chest. The area was then prepped and draped in the normal sterile fashion. 1% Lidocaine was used for local anesthesia. Under ultrasound guidance a 6 Fr Safe-T-Centesis catheter was introduced. Thoracentesis was performed. The catheter was removed and a dressing applied. FINDINGS: A total of approximately 200 cc of serosanguineous fluid was removed. Samples were sent to the laboratory as requested by the clinical team. IMPRESSION: Successful ultrasound guided left thoracentesis yielding 200 cc of pleural fluid. Electronically Signed   By: Marybelle Killings [name]   On: 09/17/2016 14:07       Discharge Exam: Vitals:   09/19/16 1348 09/19/16 1420  BP: 102/87   Pulse: 80 78  Resp: 16 16  Temp:    SpO2: 93% 94%   Vitals:   09/19/16 1046 09/19/16 1105 09/19/16 1348 09/19/16 1420  BP: (!) 96/46  102/87   Pulse: 77  80 78  Resp:   16 16  Temp:      TempSrc:      SpO2:  95% 93% 94%  Weight:      Height:        General: Pt is alert, awake, not in acute distress Cardiovascular: RRR, S1/S2 +, no rubs, no gallops Respiratory: CTA bilaterally, no wheezing, no rhonchi Abdominal: Soft, NT, ND, bowel sounds + Extremities: no edema, no cyanosis    The results of significant diagnostics from this hospitalization (including imaging, microbiology, ancillary and laboratory) are listed below for reference.     Microbiology: Recent Results (from the past 240 hour(s))  MRSA PCR Screening     Status: None   Collection Time: 09/13/16  8:45 AM  Result Value Ref Range Status   MRSA by PCR NEGATIVE NEGATIVE Final    Comment:        The GeneXpert MRSA Assay (FDA approved for NASAL specimens only), is one component of a comprehensive MRSA colonization surveillance program. It is not intended to diagnose MRSA infection nor to guide or monitor treatment for MRSA  infections.   Culture, Urine     Status: Abnormal   Collection Time: 09/13/16  9:44 AM  Result Value Ref Range Status   Specimen Description URINE, RANDOM  Final   Special Requests NONE  Final   Culture >=100,000 COLONIES/mL ESCHERICHIA COLI (A)  Final   Report Status 09/15/2016 FINAL  Final   Organism ID, Bacteria ESCHERICHIA COLI (A)  Final      Susceptibility   Escherichia coli - MIC*    AMPICILLIN >=32 RESISTANT Resistant     CEFAZOLIN 16 SENSITIVE Sensitive     CEFTRIAXONE <=1 SENSITIVE Sensitive     CIPROFLOXACIN <=0.25 SENSITIVE Sensitive     GENTAMICIN <=1 SENSITIVE Sensitive     IMIPENEM <=0.25 SENSITIVE Sensitive     NITROFURANTOIN <=16 SENSITIVE Sensitive     TRIMETH/SULFA <=20 SENSITIVE Sensitive     AMPICILLIN/SULBACTAM >=32 RESISTANT Resistant     PIP/TAZO 64 INTERMEDIATE Intermediate     Extended ESBL NEGATIVE Sensitive     * >=100,000 COLONIES/mL ESCHERICHIA COLI  Culture, blood (routine x 2) Call MD if unable to obtain prior to antibiotics being given     Status: None   Collection Time: 09/13/16  9:59 AM  Result Value Ref Range Status   Specimen Description BLOOD RIGHT ANTECUBITAL  Final   Special Requests   Final    BOTTLES DRAWN AEROBIC ONLY Blood Culture adequate volume   Culture NO GROWTH 5 DAYS  Final   Report Status 09/18/2016 FINAL  Final  Culture, blood (routine x 2) Call MD if unable to obtain prior to antibiotics being given     Status: None   Collection Time: 09/13/16 10:08 AM  Result Value Ref Range Status   Specimen Description BLOOD RIGHT HAND  Final   Special Requests   Final    BOTTLES DRAWN AEROBIC ONLY Blood Culture adequate volume   Culture NO GROWTH 5 DAYS  Final   Report Status 09/18/2016 FINAL  Final  Respiratory Panel by PCR     Status: None   Collection Time: 09/15/16 12:43 PM  Result Value Ref Range Status   Adenovirus NOT DETECTED NOT DETECTED Final   Coronavirus 229E NOT DETECTED NOT DETECTED Final   Coronavirus HKU1 NOT  DETECTED NOT DETECTED Final   Coronavirus NL63 NOT DETECTED NOT DETECTED Final   Coronavirus OC43 NOT DETECTED NOT DETECTED Final   Metapneumovirus  NOT DETECTED NOT DETECTED Final   Rhinovirus / Enterovirus NOT DETECTED NOT DETECTED Final   Influenza A NOT DETECTED NOT DETECTED Final   Influenza A H1 NOT DETECTED NOT DETECTED Final   Influenza A H1 2009 NOT DETECTED NOT DETECTED Final   Influenza A H3 NOT DETECTED NOT DETECTED Final   Influenza B NOT DETECTED NOT DETECTED Final   Parainfluenza Virus 1 NOT DETECTED NOT DETECTED Final   Parainfluenza Virus 2 NOT DETECTED NOT DETECTED Final   Parainfluenza Virus 3 NOT DETECTED NOT DETECTED Final   Parainfluenza Virus 4 NOT DETECTED NOT DETECTED Final   Respiratory Syncytial Virus NOT DETECTED NOT DETECTED Final   Bordetella pertussis NOT DETECTED NOT DETECTED Final   Chlamydophila pneumoniae NOT DETECTED NOT DETECTED Final   Mycoplasma pneumoniae NOT DETECTED NOT DETECTED Final  Culture, sputum-assessment     Status: None   Collection Time: 09/17/16  3:00 AM  Result Value Ref Range Status   Specimen Description SPUTUM  Final   Special Requests NONE  Final   Sputum evaluation THIS SPECIMEN IS ACCEPTABLE FOR SPUTUM CULTURE  Final   Report Status 09/17/2016 FINAL  Final  Culture, respiratory (NON-Expectorated)     Status: None   Collection Time: 09/17/16  3:00 AM  Result Value Ref Range Status   Specimen Description SPUTUM  Final   Special Requests NONE Reflexed from [id number]  Final   Gram Stain   Final    RARE WBC PRESENT,BOTH PMN AND MONONUCLEAR NO ORGANISMS SEEN    Culture RARE Consistent with normal respiratory flora.  Final   Report Status 09/19/2016 FINAL  Final  Culture, body fluid-bottle     Status: None (Preliminary result)   Collection Time: 09/17/16  1:41 PM  Result Value Ref Range Status   Specimen Description PLEURAL LEFT  Final   Special Requests NONE  Final   Culture NO GROWTH 2 DAYS  Final   Report Status PENDING   Incomplete  Gram stain     Status: None   Collection Time: 09/17/16  1:41 PM  Result Value Ref Range Status   Specimen Description PLEURAL LEFT  Final   Special Requests NONE  Final   Gram Stain   Final    RARE WBC PRESENT, PREDOMINANTLY MONONUCLEAR NO ORGANISMS SEEN    Report Status 09/18/2016 FINAL  Final     Labs: BNP (last 3 results)  Recent Labs  12/02/15 2118 06/14/16 0855 09/13/16 0253  BNP 184.6* 407.7* 846.9*   Basic Metabolic Panel:  Recent Labs Lab 09/14/16 2200 09/15/16 0627 09/16/16 0821 09/17/16 0414 09/18/16 0310  NA 142 142 138 134* 138  K 5.0 4.9 4.5 4.5 4.8  CL 89* 89* 86* 87* 90*  CO2 44* 43* 45* 38* 42*  GLUCOSE 176* 210* 178* 157* 211*  BUN 16 16 16 14 11   CREATININE 0.73 0.61 0.55 0.59 0.56  CALCIUM 9.0 9.1 9.1 8.7* 8.9  MG 2.4 2.2 2.2 2.0 2.1  PHOS  --  4.0 4.1 3.5  --    Liver Function Tests:  Recent Labs Lab 09/13/16 0248 09/14/16 0356 09/15/16 0627 09/16/16 0821 09/17/16 0414  AST 13* 13*  --   --   --   ALT 13* 12*  --   --   --   ALKPHOS 76 55  --   --   --   BILITOT 0.6 0.7  --   --   --   PROT 7.8 6.5  --   --   --  ALBUMIN 2.7* 2.3* 2.4* 2.2* 2.3*   No results for input(s): LIPASE, AMYLASE in the last 168 hours. No results for input(s): AMMONIA in the last 168 hours. CBC:  Recent Labs Lab 09/13/16 0248 09/14/16 0356 09/15/16 0627 09/16/16 0826 09/17/16 0414 09/18/16 0310  WBC 12.4* 10.1 6.8 6.5 8.6 5.9  NEUTROABS 10.5*  --  6.0 5.3 8.0* 5.2  HGB 11.8* 9.9* 10.4* 10.6* 10.7* 10.7*  HCT 44.3 36.5 37.3 37.0 36.6 37.5  MCV 101.1* 98.6 94.0 93.9 91.0 93.3  PLT 271 209 173 163 190 152   Cardiac Enzymes: No results for input(s): CKTOTAL, CKMB, CKMBINDEX, TROPONINI in the last 168 hours. BNP: Invalid input(s): POCBNP CBG:  Recent Labs Lab 09/18/16 1213 09/18/16 1537 09/18/16 1913 09/19/16 0800 09/19/16 1218  GLUCAP 183* 162* 151* 96 155*   D-Dimer No results for input(s): DDIMER in the last 72  hours. Hgb A1c No results for input(s): HGBA1C in the last 72 hours. Lipid Profile No results for input(s): CHOL, HDL, LDLCALC, TRIG, CHOLHDL, LDLDIRECT in the last 72 hours. Thyroid function studies No results for input(s): TSH, T4TOTAL, T3FREE, THYROIDAB in the last 72 hours.  Invalid input(s): FREET3 Anemia work up No results for input(s): VITAMINB12, FOLATE, FERRITIN, TIBC, IRON, RETICCTPCT in the last 72 hours. Urinalysis    Component Value Date/Time   COLORURINE YELLOW 09/13/2016 0944   APPEARANCEUR CLOUDY (A) 09/13/2016 0944   LABSPEC 1.008 09/13/2016 0944   PHURINE 5.0 09/13/2016 0944   GLUCOSEU NEGATIVE 09/13/2016 0944   HGBUR MODERATE (A) 09/13/2016 0944   BILIRUBINUR NEGATIVE 09/13/2016 0944   KETONESUR NEGATIVE 09/13/2016 0944   PROTEINUR NEGATIVE 09/13/2016 0944   NITRITE NEGATIVE 09/13/2016 0944   LEUKOCYTESUR LARGE (A) 09/13/2016 0944   Sepsis Labs Invalid input(s): PROCALCITONIN,  WBC,  LACTICIDVEN Microbiology Recent Results (from the past 240 hour(s))  MRSA PCR Screening     Status: None   Collection Time: 09/13/16  8:45 AM  Result Value Ref Range Status   MRSA by PCR NEGATIVE NEGATIVE Final    Comment:        The GeneXpert MRSA Assay (FDA approved for NASAL specimens only), is one component of a comprehensive MRSA colonization surveillance program. It is not intended to diagnose MRSA infection nor to guide or monitor treatment for MRSA infections.   Culture, Urine     Status: Abnormal   Collection Time: 09/13/16  9:44 AM  Result Value Ref Range Status   Specimen Description URINE, RANDOM  Final   Special Requests NONE  Final   Culture >=100,000 COLONIES/mL ESCHERICHIA COLI (A)  Final   Report Status 09/15/2016 FINAL  Final   Organism ID, Bacteria ESCHERICHIA COLI (A)  Final      Susceptibility   Escherichia coli - MIC*    AMPICILLIN >=32 RESISTANT Resistant     CEFAZOLIN 16 SENSITIVE Sensitive     CEFTRIAXONE <=1 SENSITIVE Sensitive      CIPROFLOXACIN <=0.25 SENSITIVE Sensitive     GENTAMICIN <=1 SENSITIVE Sensitive     IMIPENEM <=0.25 SENSITIVE Sensitive     NITROFURANTOIN <=16 SENSITIVE Sensitive     TRIMETH/SULFA <=20 SENSITIVE Sensitive     AMPICILLIN/SULBACTAM >=32 RESISTANT Resistant     PIP/TAZO 64 INTERMEDIATE Intermediate     Extended ESBL NEGATIVE Sensitive     * >=100,000 COLONIES/mL ESCHERICHIA COLI  Culture, blood (routine x 2) Call MD if unable to obtain prior to antibiotics being given     Status: None   Collection Time: 09/13/16  9:59 AM  Result Value Ref Range Status   Specimen Description BLOOD RIGHT ANTECUBITAL  Final   Special Requests   Final    BOTTLES DRAWN AEROBIC ONLY Blood Culture adequate volume   Culture NO GROWTH 5 DAYS  Final   Report Status 09/18/2016 FINAL  Final  Culture, blood (routine x 2) Call MD if unable to obtain prior to antibiotics being given     Status: None   Collection Time: 09/13/16 10:08 AM  Result Value Ref Range Status   Specimen Description BLOOD RIGHT HAND  Final   Special Requests   Final    BOTTLES DRAWN AEROBIC ONLY Blood Culture adequate volume   Culture NO GROWTH 5 DAYS  Final   Report Status 09/18/2016 FINAL  Final  Respiratory Panel by PCR     Status: None   Collection Time: 09/15/16 12:43 PM  Result Value Ref Range Status   Adenovirus NOT DETECTED NOT DETECTED Final   Coronavirus 229E NOT DETECTED NOT DETECTED Final   Coronavirus HKU1 NOT DETECTED NOT DETECTED Final   Coronavirus NL63 NOT DETECTED NOT DETECTED Final   Coronavirus OC43 NOT DETECTED NOT DETECTED Final   Metapneumovirus NOT DETECTED NOT DETECTED Final   Rhinovirus / Enterovirus NOT DETECTED NOT DETECTED Final   Influenza A NOT DETECTED NOT DETECTED Final   Influenza A H1 NOT DETECTED NOT DETECTED Final   Influenza A H1 2009 NOT DETECTED NOT DETECTED Final   Influenza A H3 NOT DETECTED NOT DETECTED Final   Influenza B NOT DETECTED NOT DETECTED Final   Parainfluenza Virus 1 NOT DETECTED NOT  DETECTED Final   Parainfluenza Virus 2 NOT DETECTED NOT DETECTED Final   Parainfluenza Virus 3 NOT DETECTED NOT DETECTED Final   Parainfluenza Virus 4 NOT DETECTED NOT DETECTED Final   Respiratory Syncytial Virus NOT DETECTED NOT DETECTED Final   Bordetella pertussis NOT DETECTED NOT DETECTED Final   Chlamydophila pneumoniae NOT DETECTED NOT DETECTED Final   Mycoplasma pneumoniae NOT DETECTED NOT DETECTED Final  Culture, sputum-assessment     Status: None   Collection Time: 09/17/16  3:00 AM  Result Value Ref Range Status   Specimen Description SPUTUM  Final   Special Requests NONE  Final   Sputum evaluation THIS SPECIMEN IS ACCEPTABLE FOR SPUTUM CULTURE  Final   Report Status 09/17/2016 FINAL  Final  Culture, respiratory (NON-Expectorated)     Status: None   Collection Time: 09/17/16  3:00 AM  Result Value Ref Range Status   Specimen Description SPUTUM  Final   Special Requests NONE Reflexed from W2006  Final   Gram Stain   Final    RARE WBC PRESENT,BOTH PMN AND MONONUCLEAR NO ORGANISMS SEEN    Culture RARE Consistent with normal respiratory flora.  Final   Report Status 09/19/2016 FINAL  Final  Culture, body fluid-bottle     Status: None (Preliminary result)   Collection Time: 09/17/16  1:41 PM  Result Value Ref Range Status   Specimen Description PLEURAL LEFT  Final   Special Requests NONE  Final   Culture NO GROWTH 2 DAYS  Final   Report Status PENDING  Incomplete  Gram stain     Status: None   Collection Time: 09/17/16  1:41 PM  Result Value Ref Range Status   Specimen Description PLEURAL LEFT  Final   Special Requests NONE  Final   Gram Stain   Final    RARE WBC PRESENT, PREDOMINANTLY MONONUCLEAR NO ORGANISMS SEEN    Report  Status 09/18/2016 FINAL  Final     Time coordinating discharge: Over 30 minutes  SIGNED:   Debbe Odea, MD  [facility] 09/19/2016, 2:42 PM Pager   If 7PM-7AM, please contact night-coverage [email or web address] Password [email or web address]

## 2016-09-20 LAB — CHOLESTEROL, BODY FLUID: CHOL FL: 63 mg/dL

## 2016-09-22 LAB — CULTURE, BODY FLUID W GRAM STAIN -BOTTLE: Culture: NO GROWTH

## 2016-10-05 ENCOUNTER — Inpatient Hospital Stay: Payer: BLUE CROSS/BLUE SHIELD | Admitting: Acute Care

## 2016-10-23 ENCOUNTER — Inpatient Hospital Stay: Payer: BLUE CROSS/BLUE SHIELD | Admitting: Acute Care

## 2017-02-02 ENCOUNTER — Emergency Department (HOSPITAL_COMMUNITY): Payer: BLUE CROSS/BLUE SHIELD

## 2017-02-02 ENCOUNTER — Inpatient Hospital Stay (HOSPITAL_COMMUNITY): Payer: BLUE CROSS/BLUE SHIELD

## 2017-02-02 ENCOUNTER — Other Ambulatory Visit: Payer: Self-pay

## 2017-02-02 ENCOUNTER — Telehealth: Payer: Self-pay | Admitting: Internal Medicine

## 2017-02-02 ENCOUNTER — Inpatient Hospital Stay (HOSPITAL_COMMUNITY)
Admission: EM | Admit: 2017-02-02 | Discharge: 2017-02-10 | DRG: 186 | Disposition: A | Discharge status: Home / Self Care | Payer: BLUE CROSS/BLUE SHIELD | Attending: Internal Medicine | Admitting: Internal Medicine

## 2017-02-02 DIAGNOSIS — E875 Hyperkalemia: Secondary | ICD-10-CM | POA: Diagnosis present

## 2017-02-02 DIAGNOSIS — N390 Urinary tract infection, site not specified: Secondary | ICD-10-CM | POA: Diagnosis present

## 2017-02-02 DIAGNOSIS — J939 Pneumothorax, unspecified: Secondary | ICD-10-CM

## 2017-02-02 DIAGNOSIS — J90 Pleural effusion, not elsewhere classified: Principal | ICD-10-CM | POA: Diagnosis present

## 2017-02-02 DIAGNOSIS — J9811 Atelectasis: Secondary | ICD-10-CM | POA: Diagnosis present

## 2017-02-02 DIAGNOSIS — B9620 Unspecified Escherichia coli [E. coli] as the cause of diseases classified elsewhere: Secondary | ICD-10-CM | POA: Diagnosis present

## 2017-02-02 DIAGNOSIS — R918 Other nonspecific abnormal finding of lung field: Secondary | ICD-10-CM

## 2017-02-02 DIAGNOSIS — J9601 Acute respiratory failure with hypoxia: Secondary | ICD-10-CM | POA: Diagnosis not present

## 2017-02-02 DIAGNOSIS — C3412 Malignant neoplasm of upper lobe, left bronchus or lung: Secondary | ICD-10-CM | POA: Diagnosis present

## 2017-02-02 DIAGNOSIS — E872 Acidosis: Secondary | ICD-10-CM | POA: Diagnosis present

## 2017-02-02 DIAGNOSIS — Z515 Encounter for palliative care: Secondary | ICD-10-CM | POA: Diagnosis not present

## 2017-02-02 DIAGNOSIS — M533 Sacrococcygeal disorders, not elsewhere classified: Secondary | ICD-10-CM | POA: Diagnosis present

## 2017-02-02 DIAGNOSIS — Z8673 Personal history of transient ischemic attack (TIA), and cerebral infarction without residual deficits: Secondary | ICD-10-CM

## 2017-02-02 DIAGNOSIS — E118 Type 2 diabetes mellitus with unspecified complications: Secondary | ICD-10-CM | POA: Diagnosis present

## 2017-02-02 DIAGNOSIS — J441 Chronic obstructive pulmonary disease with (acute) exacerbation: Secondary | ICD-10-CM | POA: Diagnosis present

## 2017-02-02 DIAGNOSIS — Z9119 Patient's noncompliance with other medical treatment and regimen: Secondary | ICD-10-CM

## 2017-02-02 DIAGNOSIS — R4182 Altered mental status, unspecified: Secondary | ICD-10-CM | POA: Diagnosis not present

## 2017-02-02 DIAGNOSIS — Z833 Family history of diabetes mellitus: Secondary | ICD-10-CM

## 2017-02-02 DIAGNOSIS — J189 Pneumonia, unspecified organism: Secondary | ICD-10-CM | POA: Diagnosis present

## 2017-02-02 DIAGNOSIS — I700 Atherosclerosis of aorta: Secondary | ICD-10-CM | POA: Diagnosis present

## 2017-02-02 DIAGNOSIS — D638 Anemia in other chronic diseases classified elsewhere: Secondary | ICD-10-CM | POA: Diagnosis present

## 2017-02-02 DIAGNOSIS — I110 Hypertensive heart disease with heart failure: Secondary | ICD-10-CM | POA: Diagnosis present

## 2017-02-02 DIAGNOSIS — Z87891 Personal history of nicotine dependence: Secondary | ICD-10-CM

## 2017-02-02 DIAGNOSIS — G9341 Metabolic encephalopathy: Secondary | ICD-10-CM | POA: Diagnosis present

## 2017-02-02 DIAGNOSIS — J9612 Chronic respiratory failure with hypercapnia: Secondary | ICD-10-CM | POA: Diagnosis not present

## 2017-02-02 DIAGNOSIS — J440 Chronic obstructive pulmonary disease with acute lower respiratory infection: Secondary | ICD-10-CM | POA: Diagnosis present

## 2017-02-02 DIAGNOSIS — J9621 Acute and chronic respiratory failure with hypoxia: Secondary | ICD-10-CM | POA: Diagnosis present

## 2017-02-02 DIAGNOSIS — L89159 Pressure ulcer of sacral region, unspecified stage: Secondary | ICD-10-CM | POA: Diagnosis present

## 2017-02-02 DIAGNOSIS — M1990 Unspecified osteoarthritis, unspecified site: Secondary | ICD-10-CM | POA: Diagnosis present

## 2017-02-02 DIAGNOSIS — I252 Old myocardial infarction: Secondary | ICD-10-CM

## 2017-02-02 DIAGNOSIS — R109 Unspecified abdominal pain: Secondary | ICD-10-CM

## 2017-02-02 DIAGNOSIS — R0602 Shortness of breath: Secondary | ICD-10-CM

## 2017-02-02 DIAGNOSIS — Z881 Allergy status to other antibiotic agents status: Secondary | ICD-10-CM

## 2017-02-02 DIAGNOSIS — I4891 Unspecified atrial fibrillation: Secondary | ICD-10-CM | POA: Diagnosis present

## 2017-02-02 DIAGNOSIS — I5032 Chronic diastolic (congestive) heart failure: Secondary | ICD-10-CM | POA: Diagnosis present

## 2017-02-02 DIAGNOSIS — Z87440 Personal history of urinary (tract) infections: Secondary | ICD-10-CM

## 2017-02-02 DIAGNOSIS — Z951 Presence of aortocoronary bypass graft: Secondary | ICD-10-CM

## 2017-02-02 DIAGNOSIS — Z825 Family history of asthma and other chronic lower respiratory diseases: Secondary | ICD-10-CM

## 2017-02-02 DIAGNOSIS — Z794 Long term (current) use of insulin: Secondary | ICD-10-CM

## 2017-02-02 DIAGNOSIS — Z801 Family history of malignant neoplasm of trachea, bronchus and lung: Secondary | ICD-10-CM

## 2017-02-02 DIAGNOSIS — Z79899 Other long term (current) drug therapy: Secondary | ICD-10-CM

## 2017-02-02 DIAGNOSIS — J9622 Acute and chronic respiratory failure with hypercapnia: Secondary | ICD-10-CM | POA: Diagnosis present

## 2017-02-02 DIAGNOSIS — J9610 Chronic respiratory failure, unspecified whether with hypoxia or hypercapnia: Secondary | ICD-10-CM | POA: Diagnosis present

## 2017-02-02 DIAGNOSIS — Z7901 Long term (current) use of anticoagulants: Secondary | ICD-10-CM

## 2017-02-02 DIAGNOSIS — Z66 Do not resuscitate: Secondary | ICD-10-CM | POA: Diagnosis present

## 2017-02-02 DIAGNOSIS — Z7189 Other specified counseling: Secondary | ICD-10-CM | POA: Diagnosis not present

## 2017-02-02 DIAGNOSIS — F419 Anxiety disorder, unspecified: Secondary | ICD-10-CM | POA: Diagnosis present

## 2017-02-02 DIAGNOSIS — J9690 Respiratory failure, unspecified, unspecified whether with hypoxia or hypercapnia: Secondary | ICD-10-CM

## 2017-02-02 DIAGNOSIS — Z9981 Dependence on supplemental oxygen: Secondary | ICD-10-CM | POA: Diagnosis not present

## 2017-02-02 DIAGNOSIS — R1084 Generalized abdominal pain: Secondary | ICD-10-CM | POA: Diagnosis not present

## 2017-02-02 DIAGNOSIS — Z90710 Acquired absence of both cervix and uterus: Secondary | ICD-10-CM

## 2017-02-02 DIAGNOSIS — Z8249 Family history of ischemic heart disease and other diseases of the circulatory system: Secondary | ICD-10-CM

## 2017-02-02 DIAGNOSIS — J9611 Chronic respiratory failure with hypoxia: Secondary | ICD-10-CM | POA: Diagnosis not present

## 2017-02-02 LAB — URINALYSIS, ROUTINE W REFLEX MICROSCOPIC
Glucose, UA: NEGATIVE mg/dL
KETONES UR: 15 mg/dL — AB
Nitrite: POSITIVE — AB
PROTEIN: 100 mg/dL — AB
Specific Gravity, Urine: 1.025 (ref 1.005–1.030)
pH: 6 (ref 5.0–8.0)

## 2017-02-02 LAB — CBC WITH DIFFERENTIAL/PLATELET
BASOS PCT: 0 %
Basophils Absolute: 0 10*3/uL (ref 0.0–0.1)
EOS ABS: 0.1 10*3/uL (ref 0.0–0.7)
EOS PCT: 1 %
HCT: 32.2 % — ABNORMAL LOW (ref 36.0–46.0)
Hemoglobin: 8.3 g/dL — ABNORMAL LOW (ref 12.0–15.0)
LYMPHS ABS: 0.9 10*3/uL (ref 0.7–4.0)
Lymphocytes Relative: 9 %
MCH: 24.7 pg — AB (ref 26.0–34.0)
MCHC: 25.8 g/dL — AB (ref 30.0–36.0)
MCV: 95.8 fL (ref 78.0–100.0)
MONOS PCT: 9 %
Monocytes Absolute: 0.9 10*3/uL (ref 0.1–1.0)
Neutro Abs: 8.8 10*3/uL — ABNORMAL HIGH (ref 1.7–7.7)
Neutrophils Relative %: 81 %
PLATELETS: 311 10*3/uL (ref 150–400)
RBC: 3.36 MIL/uL — ABNORMAL LOW (ref 3.87–5.11)
RDW: 14.7 % (ref 11.5–15.5)
WBC: 10.6 10*3/uL — AB (ref 4.0–10.5)

## 2017-02-02 LAB — URINALYSIS, MICROSCOPIC (REFLEX)

## 2017-02-02 LAB — COMPREHENSIVE METABOLIC PANEL
ALK PHOS: 78 U/L (ref 38–126)
ALT: 9 U/L — AB (ref 14–54)
AST: 12 U/L — AB (ref 15–41)
Albumin: 2.1 g/dL — ABNORMAL LOW (ref 3.5–5.0)
Anion gap: 16 — ABNORMAL HIGH (ref 5–15)
BUN: 6 mg/dL (ref 6–20)
CALCIUM: 9.2 mg/dL (ref 8.9–10.3)
CHLORIDE: 73 mmol/L — AB (ref 101–111)
CO2: 46 mmol/L — ABNORMAL HIGH (ref 22–32)
CREATININE: 0.62 mg/dL (ref 0.44–1.00)
GFR calc Af Amer: >60 mL/min (ref >60)
Glucose, Bld: 163 mg/dL — ABNORMAL HIGH (ref 65–99)
Potassium: 3.8 mmol/L (ref 3.5–5.1)
Sodium: 135 mmol/L (ref 135–145)
Total Bilirubin: 0.8 mg/dL (ref 0.3–1.2)
Total Protein: 7.6 g/dL (ref 6.5–8.1)

## 2017-02-02 LAB — I-STAT CG4 LACTIC ACID, ED: LACTIC ACID, VENOUS: 1.14 mmol/L (ref 0.5–1.9)

## 2017-02-02 MED ORDER — NYSTATIN 100000 UNIT/GM EX POWD
Freq: Once | CUTANEOUS | Status: AC
  Administered 2017-02-02: 21:00:00 via TOPICAL
  Filled 2017-02-02: qty 15

## 2017-02-02 MED ORDER — IOPAMIDOL (ISOVUE-370) INJECTION 76%
INTRAVENOUS | Status: AC
  Administered 2017-02-02: 100 mL
  Filled 2017-02-02: qty 100

## 2017-02-02 MED ORDER — CEFTRIAXONE SODIUM 1 G IJ SOLR
1.0000 g | Freq: Once | INTRAMUSCULAR | Status: AC
  Administered 2017-02-02: 1 g via INTRAVENOUS
  Filled 2017-02-02: qty 10

## 2017-02-02 NOTE — ED Provider Notes (Addendum)
[facility] EMERGENCY DEPARTMENT Provider Note   CSN: [id number] Arrival date & time: 02/02/17  1646     History   Chief Complaint Chief Complaint  Patient presents with  . Altered Mental Status    HPI [name] is a 62 y.o. female.  HPI Level 5 caveat due to altered mental status.   Patient presents with altered mental status.  Has been confused over the last few days at home.  Decreased oral intake.  On chronic oxygen.  Denies fevers.  Patient is somewhat confused.  Husband states she has had this before.  Has a lung mass they were not able to get a direct diagnosis on.  Past Medical History:  Diagnosis Date  . Acute and chronic respiratory failure with hypoxia ([address])   . Acute MI ([place])   . Arthritis   . CHF (congestive heart failure) ([place])   . COPD (chronic obstructive pulmonary disease) ([place])    home O2  . Coronary artery disease   . Diabetes mellitus without complication ([place])   . Mass of left lung   . Shortness of breath   . Stroke [facility]) TIA     Patient Active Problem List   Diagnosis Date Noted  . CO2 retention   . Palliative care by specialist   . Acute on chronic respiratory failure with hypoxia and hypercapnia ([place]) 09/13/2016  . Mass of left lung 09/13/2016  . Diabetes mellitus with complication ([place]) 07/30/7626  . COPD exacerbation ([place]) 09/13/2016  . Chronic diastolic heart failure ([place]) 09/13/2016  . Candidal dermatitis 09/13/2016  . Acute metabolic encephalopathy 31/51/7616  . Anemia, chronic disease 09/13/2016  . UTI (urinary tract infection) 09/13/2016  . Chronic diastolic CHF (congestive heart failure) ([place]) 06/26/2016  . Mass of upper lobe of left lung 06/26/2016  . Ventilator dependent ([place])   . Acute respiratory failure with hypercapnia ([place]) 06/14/2016  . Goals of care, counseling/discussion   . Acute on chronic congestive heart failure ([place])   . 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 Pressure injury of skin 12/04/2015  .  Coronary artery disease without angina pectoris   . Morbid obesity ([place])   . Type 2 diabetes mellitus with complication, with long-term current use of insulin ([place]) 12/20/2012  . Atrial fibrillation ([place]) 12/20/2012  . Chronic respiratory failure ([place]) 12/20/2012  . HYPERLIPIDEMIA 03/11/2008  . Essential hypertension 03/11/2008  . CORONARY HEART DISEASE 03/11/2008    Past Surgical History:  Procedure Laterality Date  . ABDOMINAL HYSTERECTOMY    . CARDIAC CATHETERIZATION    . CORONARY ARTERY BYPASS GRAFT     2003    OB History    No data available       Home Medications    Prior to Admission medications   Medication Sig Start Date End Date Taking? Authorizing Provider  albuterol (PROVENTIL) (2.5 MG/3ML) 0.083% nebulizer solution Take 3 mLs (2.5 mg total) by nebulization every 2 (two) hours as needed for wheezing. 09/19/16   Debbe Odea, MD  atorvastatin (LIPITOR) 20 MG tablet Take 1 tablet (20 mg total) by mouth daily. 02/25/13   Elsie Stain, MD  furosemide (LASIX) 20 MG tablet Take 1 tablet (20 mg total) by mouth daily as needed. 09/19/16 09/19/17  Debbe Odea, MD  gabapentin (NEURONTIN) 100 MG capsule Take 100 mg by mouth 3 (three) times daily. 03/18/15 06/14/17  [provider]  HYDROcodone-acetaminophen (NORCO/VICODIN) 5-325 MG tablet Take 1-2 tablets by mouth every 6 (six) hours as needed for moderate pain.  06/23/16   Mariel Aloe, MD  insulin aspart (NOVOLOG FLEXPEN) 100 UNIT/ML SOPN FlexPen Inject 6 Units into the skin 3 (three) times daily with meals. Patient taking differently: Inject 20 Units into the skin 2 (two) times daily.  12/18/12   Caren Griffins, MD  Insulin Degludec (TRESIBA FLEXTOUCH) 100 UNIT/ML SOPN Inject 20 Units into the skin daily.    [provider]  Ipratropium-Albuterol (COMBIVENT RESPIMAT) 20-100 MCG/ACT AERS respimat Inhale 1-2 puffs into the lungs 4 (four) times daily. 09/19/16   Debbe Odea, MD  ipratropium-albuterol  (DUONEB) 0.5-2.5 (3) MG/3ML SOLN Take 3 mLs by nebulization every 4 (four) hours as needed. 09/19/16   Debbe Odea, MD  isosorbide mononitrate (IMDUR) 60 MG 24 hr tablet Take 60 mg by mouth daily.    [provider]  metoprolol tartrate (LOPRESSOR) 25 MG tablet Take 0.5 tablets (12.5 mg total) by mouth 2 (two) times daily. 09/19/16   Debbe Odea, MD  nystatin (MYCOSTATIN/NYSTOP) powder Apply topically 3 (three) times daily. 09/19/16   Debbe Odea, MD  pantoprazole (PROTONIX) 40 MG tablet Take 1 tablet (40 mg total) by mouth daily. 09/19/16   Debbe Odea, MD  Potassium Chloride ER 20 MEQ TBCR Take 20 mEq by mouth daily as needed. 09/19/16   Debbe Odea, MD  predniSONE (DELTASONE) 10 MG tablet 40 mg x 2 days 30 mg x 4 days 20 mg x 4 days 10 mg x 4 days 09/19/16   Debbe Odea, MD  Rivaroxaban (XARELTO) 20 MG TABS tablet Take 1 tablet (20 mg total) by mouth daily. 02/25/13   Elsie Stain, MD    Family History Family History  Problem Relation Age of Onset  . Lung cancer Brother   . Emphysema Brother   . COPD Sister   . Heart attack Sister   . Diabetes Brother   . Lung cancer Brother   . Emphysema Sister     Social History Social History   Tobacco Use  . Smoking status: Former Smoker    Packs/day: 3.00    Years: 40.00    Pack years: 120.00    Types: Cigarettes    Last attempt to quit: 02/06/2009    Years since quitting: 7.9  . Smokeless tobacco: Never Used  Substance Use Topics  . Alcohol use: No  . Drug use: Not on file     Allergies   Ciprofloxacin   Review of Systems Review of Systems  Unable to perform ROS: Mental status change     Physical Exam Updated Vital Signs BP 104/87   Pulse (!) 118   Temp 98.1 F (36.7 C) (Oral)   Resp 16   SpO2 98%   Physical Exam  Constitutional: She appears well-developed.  HENT:  Head: Atraumatic.  Mucous membranes are dry  Eyes: EOM are normal.  Neck: Neck supple.  Cardiovascular:  Tachycardia    Pulmonary/Chest:  Diffuse harsh breath sounds with tachypnea.  Decreased breath sounds on left.  Abdominal: There is tenderness.  Mild upper abdominal tenderness without rebound or guarding.  Musculoskeletal: She exhibits no tenderness.  Neurological: She is alert.  Patient is alert and confused.  Continuously tries to take off the nonrebreather mask she has.  Identifies her husband but really cannot provide any other history.  Will follow commands.  Skin: Skin is warm. Capillary refill takes less than 2 seconds.     ED Treatments / Results  Labs (all labs ordered are listed, but only abnormal results are displayed) Labs  Reviewed  COMPREHENSIVE METABOLIC PANEL - Abnormal; Notable for the following components:      Result Value   Chloride 73 (*)    CO2 46 (*)    Glucose, Bld 163 (*)    Albumin 2.1 (*)    AST 12 (*)    ALT 9 (*)    Anion gap 16 (*)    All other components within normal limits  CBC WITH DIFFERENTIAL/PLATELET - Abnormal; Notable for the following components:   WBC 10.6 (*)    RBC 3.36 (*)    Hemoglobin 8.3 (*)    HCT 32.2 (*)    MCH 24.7 (*)    MCHC 25.8 (*)    Neutro Abs 8.8 (*)    All other components within normal limits  URINALYSIS, ROUTINE W REFLEX MICROSCOPIC - Abnormal; Notable for the following components:   APPearance TURBID (*)    Hgb urine dipstick MODERATE (*)    Bilirubin Urine SMALL (*)    Ketones, ur 15 (*)    Protein, ur 100 (*)    Nitrite POSITIVE (*)    Leukocytes, UA MODERATE (*)    All other components within normal limits  URINALYSIS, MICROSCOPIC (REFLEX) - Abnormal; Notable for the following components:   Bacteria, UA MANY (*)    Squamous Epithelial / LPF 0-5 (*)    Non Squamous Epithelial PRESENT (*)    All other components within normal limits  URINE CULTURE  BLOOD GAS, ARTERIAL  I-STAT CG4 LACTIC ACID, ED    EKG  EKG Interpretation  Date/Time:  Friday February 02 2017 16:58:10 EST Ventricular Rate:  126 PR Interval:     QRS Duration: 95 QT Interval:  305 QTC Calculation: 433 R Axis:   59 Text Interpretation:  Sinus rhythm Paired ventricular premature complexes Low voltage, extremity and precordial leads Minimal ST depression, diffuse leads Interpretation limited secondary to artifact Confirmed by Davonna Belling [phone]) on 02/02/2017 5:35:22 PM       Radiology Dg Chest Portable 1 View  Result Date: 02/02/2017 CLINICAL DATA:  Shortness of breath EXAM: PORTABLE CHEST 1 VIEW COMPARISON:  09/17/2016, CT 09/16/2016 FINDINGS: Diffuse opacity of the left thorax. Cardiomediastinal silhouette obscured. Vascular congestion on the right. Minimal atelectasis at the right base. Post sternotomy changes. Aortic atherosclerosis. IMPRESSION: 1. Diffuse opacification of left thorax may relate to combination of large pleural effusion and underlying atelectasis, pneumonia, or mass 2. Minimal atelectasis at the right base Electronically Signed   By: Donavan Foil [name]   On: 02/02/2017 17:51    Procedures Procedures (including critical care time)  Medications Ordered in ED Medications  nystatin (MYCOSTATIN/NYSTOP) topical powder (not administered)  cefTRIAXone (ROCEPHIN) 1 g in dextrose 5 % 50 mL IVPB (not administered)     Initial Impression / Assessment and Plan / ED Course  I have reviewed the triage vital signs and the nursing notes.  Pertinent labs & imaging results that were available during my care of the patient were reviewed by me and considered in my medical decision making (see chart for details).     Patient with mental status changes.  Has been doing worse for last few days but also somewhat over the last couple weeks.  Follow-up with her pulmonologist for her lung mass.  Worsening effusion on left side with whole left hemithorax whited out.  Would likely not want intubation and is a DNR.  Admit to internal medicine.  Given breathing treatment.  Will likely need palliative medicine consult  again.  Urinalysis also shows urinary tract infection.  Will treat with antibiotics.  Final Clinical Impressions(s) / ED Diagnoses   Final diagnoses:  Altered mental status, unspecified altered mental status type  Pleural effusion  Lung mass  Metabolic encephalopathy  Lower urinary tract infectious disease    ED Discharge Orders    None       Davonna Belling, MD 02/02/17 Sharilyn Sites    Davonna Belling, MD 02/02/17 [phone]

## 2017-02-02 NOTE — ED Triage Notes (Signed)
Pt arrived via [facility] w/ c/o altrered mental status per family; pt A/Ox2 per EMS but upon assessment pt A/Ox1 with confusion. Pt has 20GLAC placed by EMS and CBG 177. Elevated HR and on 5L via [place] at home and arrived with [place] at 5L however pt states "I cant breathe" and continuously pulls  out of nose and places in mouth

## 2017-02-02 NOTE — Telephone Encounter (Signed)
Spoke with pt's spouse, states that pt has been c/o worsening sob at rest and with exertion, requiring higher liter flow of O2 to keep her sob controlled, and has been confusing the TV sequences with reality Xover 2 weeks.  I advised pt's spouse that she needs to be taken to ED for further evaluation.  Pt's spouse expressed understanding.  Will close encounter.

## 2017-02-02 NOTE — Progress Notes (Signed)
RT NOTE:  ABG result reported to Dr. Alvino Chapel. No repeat @ this time.

## 2017-02-02 NOTE — ED Notes (Signed)
Patient transported to CT 

## 2017-02-03 ENCOUNTER — Inpatient Hospital Stay (HOSPITAL_COMMUNITY): Payer: BLUE CROSS/BLUE SHIELD

## 2017-02-03 ENCOUNTER — Encounter (HOSPITAL_COMMUNITY): Payer: Self-pay | Admitting: Internal Medicine

## 2017-02-03 ENCOUNTER — Other Ambulatory Visit: Payer: Self-pay

## 2017-02-03 DIAGNOSIS — J9621 Acute and chronic respiratory failure with hypoxia: Secondary | ICD-10-CM

## 2017-02-03 DIAGNOSIS — J189 Pneumonia, unspecified organism: Secondary | ICD-10-CM

## 2017-02-03 DIAGNOSIS — R918 Other nonspecific abnormal finding of lung field: Secondary | ICD-10-CM

## 2017-02-03 DIAGNOSIS — J9601 Acute respiratory failure with hypoxia: Secondary | ICD-10-CM

## 2017-02-03 DIAGNOSIS — J90 Pleural effusion, not elsewhere classified: Secondary | ICD-10-CM

## 2017-02-03 DIAGNOSIS — J9622 Acute and chronic respiratory failure with hypercapnia: Secondary | ICD-10-CM

## 2017-02-03 DIAGNOSIS — G9341 Metabolic encephalopathy: Secondary | ICD-10-CM

## 2017-02-03 LAB — BLOOD GAS, ARTERIAL
Acid-Base Excess: 25.8 mmol/L — ABNORMAL HIGH (ref 0.0–2.0)
Bicarbonate: 53.1 mmol/L — ABNORMAL HIGH (ref 20.0–28.0)
Drawn by: 270221
O2 Content: 5 L/min
O2 Saturation: 98.1 %
Patient temperature: 98.6
pCO2 arterial: 101 mmHg (ref 32.0–48.0)
pH, Arterial: 7.339 — ABNORMAL LOW (ref 7.350–7.450)
pO2, Arterial: 110 mmHg — ABNORMAL HIGH (ref 83.0–108.0)

## 2017-02-03 LAB — GLUCOSE, CAPILLARY
GLUCOSE-CAPILLARY: 129 mg/dL — AB (ref 65–99)
GLUCOSE-CAPILLARY: 114 mg/dL — AB (ref 65–99)
Glucose-Capillary: 123 mg/dL — ABNORMAL HIGH (ref 65–99)
Glucose-Capillary: 134 mg/dL — ABNORMAL HIGH (ref 65–99)

## 2017-02-03 LAB — BODY FLUID CELL COUNT WITH DIFFERENTIAL
EOS FL: 0 %
Lymphs, Fluid: 24 %
MONOCYTE-MACROPHAGE-SEROUS FLUID: 52 % (ref 50–90)
Neutrophil Count, Fluid: 24 % (ref 0–25)
WBC FLUID: 1206 uL — AB (ref 0–1000)

## 2017-02-03 LAB — CBC
HCT: 32.6 % — ABNORMAL LOW (ref 36.0–46.0)
HEMOGLOBIN: 8.5 g/dL — AB (ref 12.0–15.0)
MCH: 25 pg — AB (ref 26.0–34.0)
MCHC: 26.1 g/dL — AB (ref 30.0–36.0)
MCV: 95.9 fL (ref 78.0–100.0)
PLATELETS: 317 10*3/uL (ref 150–400)
RBC: 3.4 MIL/uL — AB (ref 3.87–5.11)
RDW: 14.7 % (ref 11.5–15.5)
WBC: 12.5 10*3/uL — ABNORMAL HIGH (ref 4.0–10.5)

## 2017-02-03 LAB — BASIC METABOLIC PANEL
ANION GAP: 12 (ref 5–15)
BUN: 5 mg/dL — ABNORMAL LOW (ref 6–20)
CALCIUM: 9.5 mg/dL (ref 8.9–10.3)
CO2: 50 mmol/L — ABNORMAL HIGH (ref 22–32)
CREATININE: 0.58 mg/dL (ref 0.44–1.00)
Chloride: 75 mmol/L — ABNORMAL LOW (ref 101–111)
GLUCOSE: 128 mg/dL — AB (ref 65–99)
Potassium: 4 mmol/L (ref 3.5–5.1)
Sodium: 137 mmol/L (ref 135–145)

## 2017-02-03 LAB — GLUCOSE, PLEURAL OR PERITONEAL FLUID: Glucose, Fluid: 120 mg/dL

## 2017-02-03 LAB — GRAM STAIN: Gram Stain: NONE SEEN

## 2017-02-03 LAB — LACTATE DEHYDROGENASE, PLEURAL OR PERITONEAL FLUID: LD FL: 180 U/L — AB (ref 3–23)

## 2017-02-03 MED ORDER — MORPHINE SULFATE (PF) 2 MG/ML IV SOLN
2.0000 mg | INTRAVENOUS | Status: DC | PRN
  Administered 2017-02-03 – 2017-02-10 (×14): 2 mg via INTRAVENOUS
  Filled 2017-02-03 (×14): qty 1

## 2017-02-03 MED ORDER — PIPERACILLIN-TAZOBACTAM 3.375 G IVPB 30 MIN
3.3750 g | Freq: Once | INTRAVENOUS | Status: AC
  Administered 2017-02-03: 3.375 g via INTRAVENOUS
  Filled 2017-02-03: qty 50

## 2017-02-03 MED ORDER — METOPROLOL TARTRATE 12.5 MG HALF TABLET
12.5000 mg | ORAL_TABLET | Freq: Two times a day (BID) | ORAL | Status: DC
  Administered 2017-02-03 – 2017-02-10 (×14): 12.5 mg via ORAL
  Filled 2017-02-03 (×15): qty 1

## 2017-02-03 MED ORDER — ALBUTEROL SULFATE (2.5 MG/3ML) 0.083% IN NEBU
2.5000 mg | INHALATION_SOLUTION | RESPIRATORY_TRACT | Status: DC
  Administered 2017-02-03 – 2017-02-04 (×7): 2.5 mg via RESPIRATORY_TRACT
  Filled 2017-02-03 (×8): qty 3

## 2017-02-03 MED ORDER — VANCOMYCIN HCL IN DEXTROSE 1-5 GM/200ML-% IV SOLN
1000.0000 mg | Freq: Two times a day (BID) | INTRAVENOUS | Status: DC
  Administered 2017-02-03 – 2017-02-05 (×4): 1000 mg via INTRAVENOUS
  Filled 2017-02-03 (×4): qty 200

## 2017-02-03 MED ORDER — ALBUTEROL SULFATE (2.5 MG/3ML) 0.083% IN NEBU
2.5000 mg | INHALATION_SOLUTION | RESPIRATORY_TRACT | Status: DC | PRN
  Administered 2017-02-04 – 2017-02-06 (×3): 2.5 mg via RESPIRATORY_TRACT
  Filled 2017-02-03 (×3): qty 3

## 2017-02-03 MED ORDER — METOPROLOL TARTRATE 12.5 MG HALF TABLET
12.5000 mg | ORAL_TABLET | Freq: Every day | ORAL | Status: DC
  Filled 2017-02-03: qty 1

## 2017-02-03 MED ORDER — PIPERACILLIN-TAZOBACTAM 3.375 G IVPB
3.3750 g | Freq: Three times a day (TID) | INTRAVENOUS | Status: DC
  Administered 2017-02-03 – 2017-02-05 (×6): 3.375 g via INTRAVENOUS
  Filled 2017-02-03 (×7): qty 50

## 2017-02-03 MED ORDER — ONDANSETRON HCL 4 MG/2ML IJ SOLN
4.0000 mg | Freq: Four times a day (QID) | INTRAMUSCULAR | Status: DC | PRN
  Administered 2017-02-05: 4 mg via INTRAVENOUS
  Filled 2017-02-03: qty 2

## 2017-02-03 MED ORDER — ONDANSETRON HCL 4 MG PO TABS: 4.0000 mg | ORAL_TABLET | Freq: Four times a day (QID) | ORAL | Status: DC | PRN

## 2017-02-03 MED ORDER — ATORVASTATIN CALCIUM 20 MG PO TABS
20.0000 mg | ORAL_TABLET | Freq: Every day | ORAL | Status: DC
  Administered 2017-02-03 – 2017-02-10 (×8): 20 mg via ORAL
  Filled 2017-02-03 (×8): qty 1

## 2017-02-03 MED ORDER — ISOSORBIDE MONONITRATE ER 60 MG PO TB24
60.0000 mg | ORAL_TABLET | Freq: Every day | ORAL | Status: DC
  Administered 2017-02-03 – 2017-02-07 (×5): 60 mg via ORAL
  Filled 2017-02-03 (×5): qty 1

## 2017-02-03 MED ORDER — VANCOMYCIN HCL 10 G IV SOLR
1500.0000 mg | Freq: Once | INTRAVENOUS | Status: AC
  Administered 2017-02-03: 1500 mg via INTRAVENOUS
  Filled 2017-02-03: qty 1500

## 2017-02-03 MED ORDER — ACETAMINOPHEN 325 MG PO TABS
650.0000 mg | ORAL_TABLET | Freq: Four times a day (QID) | ORAL | Status: DC | PRN
  Administered 2017-02-06: 650 mg via ORAL
  Filled 2017-02-03: qty 2

## 2017-02-03 MED ORDER — GABAPENTIN 100 MG PO CAPS
100.0000 mg | ORAL_CAPSULE | Freq: Three times a day (TID) | ORAL | Status: DC
  Administered 2017-02-03 – 2017-02-10 (×20): 100 mg via ORAL
  Filled 2017-02-03 (×22): qty 1

## 2017-02-03 MED ORDER — METHYLPREDNISOLONE SODIUM SUCC 40 MG IJ SOLR
40.0000 mg | Freq: Four times a day (QID) | INTRAMUSCULAR | Status: DC
  Administered 2017-02-03 – 2017-02-05 (×9): 40 mg via INTRAVENOUS
  Filled 2017-02-03 (×9): qty 1

## 2017-02-03 MED ORDER — BUDESONIDE 0.25 MG/2ML IN SUSP
0.2500 mg | Freq: Two times a day (BID) | RESPIRATORY_TRACT | Status: DC
  Administered 2017-02-03 – 2017-02-10 (×12): 0.25 mg via RESPIRATORY_TRACT
  Filled 2017-02-03 (×16): qty 2

## 2017-02-03 MED ORDER — INSULIN ASPART 100 UNIT/ML ~~LOC~~ SOLN
0.0000 [IU] | Freq: Three times a day (TID) | SUBCUTANEOUS | Status: DC
  Administered 2017-02-03 (×2): 1 [IU] via SUBCUTANEOUS
  Administered 2017-02-04: 2 [IU] via SUBCUTANEOUS
  Administered 2017-02-04: 5 [IU] via SUBCUTANEOUS
  Administered 2017-02-04 – 2017-02-05 (×2): 3 [IU] via SUBCUTANEOUS
  Administered 2017-02-05: 9 [IU] via SUBCUTANEOUS
  Administered 2017-02-05: 7 [IU] via SUBCUTANEOUS
  Administered 2017-02-06: 9 [IU] via SUBCUTANEOUS
  Administered 2017-02-06: 2 [IU] via SUBCUTANEOUS
  Administered 2017-02-06 – 2017-02-07 (×2): 3 [IU] via SUBCUTANEOUS
  Administered 2017-02-07: 2 [IU] via SUBCUTANEOUS
  Administered 2017-02-07 – 2017-02-08 (×2): 3 [IU] via SUBCUTANEOUS
  Administered 2017-02-08: 7 [IU] via SUBCUTANEOUS
  Administered 2017-02-08: 2 [IU] via SUBCUTANEOUS
  Administered 2017-02-09 (×2): 3 [IU] via SUBCUTANEOUS
  Administered 2017-02-10: 9 [IU] via SUBCUTANEOUS
  Administered 2017-02-10: 2 [IU] via SUBCUTANEOUS

## 2017-02-03 MED ORDER — LIDOCAINE HCL (PF) 1 % IJ SOLN
INTRAMUSCULAR | Status: AC
  Filled 2017-02-03: qty 30

## 2017-02-03 MED ORDER — ACETAMINOPHEN 650 MG RE SUPP: 650.0000 mg | Freq: Four times a day (QID) | RECTAL | Status: DC | PRN

## 2017-02-03 MED ORDER — IPRATROPIUM BROMIDE 0.02 % IN SOLN
0.5000 mg | RESPIRATORY_TRACT | Status: DC
  Administered 2017-02-03 – 2017-02-04 (×7): 0.5 mg via RESPIRATORY_TRACT
  Filled 2017-02-03 (×8): qty 2.5

## 2017-02-03 NOTE — Plan of Care (Signed)
  Progressing Risk for Falls Knowledge of Patient Safety will improve 02/03/2017 2101 - Progressing by Blair Promise, RN Education: Knowledge of General Education information will improve 02/03/2017 2101 - Progressing by Blair Promise, RN Health Behavior/Discharge Planning: Ability to manage health-related needs will improve 02/03/2017 2101 - Progressing by Blair Promise, RN Clinical Measurements: Ability to maintain clinical measurements within normal limits will improve 02/03/2017 2101 - Progressing by Blair Promise, RN Will remain free from infection 02/03/2017 2101 - Progressing by Blair Promise, RN Diagnostic test results will improve 02/03/2017 2101 - Progressing by Blair Promise, RN Respiratory complications will improve 02/03/2017 2101 - Progressing by Blair Promise, RN Cardiovascular complication will be avoided 02/03/2017 2101 - Progressing by Blair Promise, RN

## 2017-02-03 NOTE — Progress Notes (Addendum)
PROGRESS NOTE  [name] GQQ:[id number] DOB: [birth year] DOA: 02/02/2017 PCP: Brand Males, MD  Brief Narrative: 34yow PMH COPD, lung mass presented with increasing confusion and SOB.  Assessment/Plan Acute on chronic hypoxic resp failure secondary to large left pleural effusion ("white out"), known left lung mass, suspected pneumonia, COPD exacerbation. - wean oxygen from FM as tolerated - diag/therapeutic thora - empiric abx - steroids, bronchodilators - no need for BiPAP at the present. ABG would not change management presently and has been difficult to obtain.  Acute encephalopathy secondary to above - treat above  Left lung mass. Noted on office visit 04/2016, hospitalization 06/2016 and 09/2016 but patient put off follow-up.   Anxiety, previously needed Precedem to tolerate BiPAP. - currently stable, try lorazepam if needed  COPD, chronic hypoxic resp failure on home oxygen 5 L  DM type 2 on insulin - CBG stable, continue SSI  Chronic diastolic CHF - appears compensated, monitor I/O - Lasix daily as needed  PMH CVA on Xarelto - Xarelto on hold for thoracentesis  Aortic atherosclerosis - continue atorvastatin  Body mass index is 25.03 kg/m.   D/w husband at bedside; will consult PMT to assist with [place] in this patient who has not pursued outpt workup for lung mass over the last 8 months. Prognosis long-term is poor.   DVT prophylaxis: SCDs Code Status: DNR no intubation, confirmed with husband at bedside this AM Family Communication: as above Disposition Plan: pending    Murray Hodgkins, MD  [facility] Direct contact: [phone] --[place]  --[email or web address]; password [email or web address]  7PM-7AM contact night coverage as above 02/03/2017, 9:32 AM  LOS: 1 day   Consultants:  pulmonology  Procedures:    Antimicrobials:  Zosyn  12/29 >>  Vancomycin 12/29 >>  Interval history/Subjective: Confused, c/o ear pain. Husband reports agitation  waxes and wanes. Has been sick for some time, activity limited to transfers secondary to DOE. Confused last few days.  Objective: Vitals:  Vitals:   02/03/17 0600 02/03/17 0641  BP: 105/68 103/62  Pulse: (!) 120 (!) 102  Resp:  20  Temp:  98.2 F (36.8 C)  SpO2: 94% 100%    Exam:  Constitutional:  . Appears acute and chronically ill, awakens easily, follows simple commands Eyes:  . pupils and irises appear normal . Normal lids  ENMT:  . grossly normal hearing  . Lips appear normal Respiratory:  Marland Kitchen Very poor air movement bilaterally w/o w/r/r. Mild increased resp effort thought RR is normal Cardiovascular:  . Tachycardic, regular rate, no m/r/g . No LE extremity edema   Abdomen:  . Abdomen appears normal; no tenderness or masses Musculoskeletal:  . Digits/nails BUE: no clubbing, cyanosis, petechiae, infection; long poorly groomed nails bilaterally . RUE, LUE, RLE, LLE   o Strength symmetic; globally weak, tone grossly normal, no atrophy, no abnormal movements o No tenderness, masses Skin:  . No rashes, lesions, ulcers . palpation of skin: no induration or nodules Psychiatric:  . Mental status o Mood, affect cannot assess . judgement and insight impaired   I have personally reviewed the following:   UOP: unrecoreded I/O since admission: inaccurate Last BM charted:  Foley:  Telemetry: ST Status: SDU  Labs:  BMP noted  CBC noted, Hgb stable 8.5  Imaging studies:  CXR independently reviewed, left lung white out  Medical tests:  EKG ST very poor quality  Scheduled Meds: . albuterol  2.5 mg Nebulization Q4H  . atorvastatin  20 mg Oral Daily  .  budesonide (PULMICORT) nebulizer solution  0.25 mg Nebulization BID  . gabapentin  100 mg Oral TID  . insulin aspart  0-9 Units Subcutaneous TID WC  . ipratropium  0.5 mg Nebulization Q4H  . isosorbide mononitrate  60 mg Oral Daily  . methylPREDNISolone (SOLU-MEDROL) injection  40 mg Intravenous Q6H  .  metoprolol tartrate  12.5 mg Oral BID   Continuous Infusions: . piperacillin-tazobactam (ZOSYN)  IV    . vancomycin 1,500 mg (02/03/17 0804)  . vancomycin      Principal Problem:   Acute on chronic respiratory failure with hypoxia and hypercapnia (HCC) Active Problems:   Type 2 diabetes mellitus with complication, with long-term current use of insulin (HCC)   Chronic respiratory failure (HCC)   Chronic diastolic CHF (congestive heart failure) (HCC)   Mass of left lung   Diabetes mellitus with complication (HCC)   COPD exacerbation (HCC)   Chronic diastolic heart failure (HCC)   Acute metabolic encephalopathy   Pleural effusion, left   Pneumonia   LOS: 1 day     Prolonged service: time spent [phone], conversation with husband at bedside including imaging results, dx, review of history and treatment recommendations.

## 2017-02-03 NOTE — Progress Notes (Signed)
Pt admit to 4E11 @ 0645 alert but confuse, deny having any pain, CCMD called, on O2 non re breather @ 10L, VS WNL skin dry and flaky got a mold on her right cheek face is redden, rash to bilateral abdominal folds, perineal area redden, open area to right buttock. She is incontinent of B+B able to bear weight but is unsteady.

## 2017-02-03 NOTE — Progress Notes (Signed)
Paged CCM pager x's 2 for panic ABG values. No one has paged back at this time. Pt has been placed on Bipap & Luetta Nutting RN given ABG results

## 2017-02-03 NOTE — Progress Notes (Signed)
Pharmacy Antibiotic Note  [name] is a 61 y.o. female admitted on 02/02/2017 with pneumonia.  Pharmacy has been consulted for Vancocin and Zosyn dosing.  Plan: Vancomycin 1500mg  x1 then 1000mg  IV every 12 hours.  Goal trough 15-20 mcg/mL. Zosyn 3.375g IV q8h (4 hour infusion).  Height: 5\' 7"  (170.2 cm) Weight: 159 lb 13.3 oz (72.5 kg) IBW/kg (Calculated) : 61.6  Temp (24hrs), Avg:98.5 F (36.9 C), Min:98.1 F (36.7 C), Max:99.1 F (37.3 C)  Recent Labs  Lab 02/02/17 1724 02/02/17 1737 02/03/17 0540  WBC 10.6*  --  12.5*  CREATININE 0.62  --  0.58  LATICACIDVEN  --  1.14  --     Estimated Creatinine Clearance: 70 mL/min (by C-G formula based on SCr of 0.58 mg/dL).    Allergies  Allergen Reactions  . Ciprofloxacin Rash    REACTION: hives/rash     Thank you for allowing pharmacy to be a part of this patient's care.  Wynona Neat, PharmD, BCPS  02/03/2017 7:01 AM

## 2017-02-03 NOTE — ED Notes (Signed)
CCM at bedside 

## 2017-02-03 NOTE — Progress Notes (Signed)
Pt uses  5L [place] in her mouth at home.  The sats on non rebreather was 100% .  Placed pt on the high flow cannula at 7L in mouth like she wears at home.  sats are 98-99%

## 2017-02-03 NOTE — H&P (Signed)
History and Physical    [name] YJE:[id number] DOB: February 24, 1955 DOA: 02/02/2017  PCP: Brand Males, MD  Patient coming from: Home.  Chief Complaint: Increasing confusion and shortness of breath.  HPI: [name] is a 61 y.o. female with history of COPD, diabetes mellitus type 2, embolic CVA, CHF and known history of lung mass was brought to the ER the patient was found to be increasingly confused by patient's husband.  As per the husband patient has not been ambulatory as usual which is progressively worse last 2 weeks.  Patient also has been increasingly short of breath.  Denies any chest pain productive cough fever chills or any loss of consciousness nausea vomiting or diarrhea.  ED Course: In the ER patient is found to be hypoxic and short of breath.  Oriented to her name is able to recognize her husband and follows commands.  Chest x-ray shows left-sided opacification concerning for pleural effusion and pneumonia.  UA shows features concerning for UTI.  CT head was unremarkable CT angiogram of the chest was negative for pulmonary embolism but did show large left pleural effusion with possible consolidation.  Lung mass was also seen.  ABG was attempted but patient is refusing further attempts.  Patient is also refusing BiPAP.  Review of Systems: As per HPI, rest all negative.   Past Medical History:  Diagnosis Date  . Acute and chronic respiratory failure with hypoxia ([place])   . Acute MI ([place])   . Arthritis   . CHF (congestive heart failure) ([place])   . COPD (chronic obstructive pulmonary disease) ([place])    home O2  . Coronary artery disease   . Diabetes mellitus without complication ([place])   . Mass of left lung   . Shortness of breath   . Stroke [facility]) TIA     Past Surgical History:  Procedure Laterality Date  . ABDOMINAL HYSTERECTOMY    . CARDIAC CATHETERIZATION    . CORONARY ARTERY BYPASS GRAFT     2003     reports that she quit smoking about 7 years ago. Her  smoking use included cigarettes. She has a 120.00 pack-year smoking history. she has never used smokeless tobacco. She reports that she does not drink alcohol. Her drug history is not on file.  Allergies  Allergen Reactions  . Ciprofloxacin Rash    REACTION: hives/rash    Family History  Problem Relation Age of Onset  . Lung cancer Brother   . Emphysema Brother   . COPD Sister   . Heart attack Sister   . Diabetes Brother   . Lung cancer Brother   . Emphysema Sister     Prior to Admission medications   Medication Sig Start Date End Date Taking? Authorizing Provider  albuterol (PROVENTIL) (2.5 MG/3ML) 0.083% nebulizer solution Take 3 mLs (2.5 mg total) by nebulization every 2 (two) hours as needed for wheezing. 09/19/16  Yes Debbe Odea, MD  atorvastatin (LIPITOR) 20 MG tablet Take 1 tablet (20 mg total) by mouth daily. 02/25/13  Yes Elsie Stain, MD  furosemide (LASIX) 20 MG tablet Take 1 tablet (20 mg total) by mouth daily as needed. 09/19/16 09/19/17 Yes Debbe Odea, MD  gabapentin (NEURONTIN) 100 MG capsule Take 100 mg by mouth 3 (three) times daily. 03/18/15 06/14/17 Yes [provider]  insulin aspart (NOVOLOG FLEXPEN) 100 UNIT/ML SOPN FlexPen Inject 6 Units into the skin 3 (three) times daily with meals. Patient taking differently: Inject 20 Units into the skin 2 (two) times  daily.  12/18/12  Yes Gherghe, Vella Redhead, MD  Insulin Degludec (TRESIBA FLEXTOUCH) 100 UNIT/ML SOPN Inject 20 Units into the skin daily.   Yes [provider]  Ipratropium-Albuterol (COMBIVENT RESPIMAT) 20-100 MCG/ACT AERS respimat Inhale 1-2 puffs into the lungs 4 (four) times daily. 09/19/16  Yes Rizwan, Eunice Blase, MD  ipratropium-albuterol (DUONEB) 0.5-2.5 (3) MG/3ML SOLN Take 3 mLs by nebulization every 4 (four) hours as needed. Patient taking differently: Take 3 mLs by nebulization every 4 (four) hours as needed (for shortness of breath).  09/19/16  Yes Debbe Odea, MD  isosorbide  mononitrate (IMDUR) 60 MG 24 hr tablet Take 60 mg by mouth daily.   Yes [provider]  metoprolol tartrate (LOPRESSOR) 25 MG tablet Take 0.5 tablets (12.5 mg total) by mouth 2 (two) times daily. Patient taking differently: Take 12.5 mg by mouth daily.  09/19/16  Yes Debbe Odea, MD  Potassium Chloride ER 20 MEQ TBCR Take 20 mEq by mouth daily as needed. Patient taking differently: Take 20 mEq by mouth daily as needed (with furosemide).  09/19/16  Yes Debbe Odea, MD  Rivaroxaban (XARELTO) 20 MG TABS tablet Take 1 tablet (20 mg total) by mouth daily. 02/25/13  Yes Elsie Stain, MD  HYDROcodone-acetaminophen (NORCO/VICODIN) 5-325 MG tablet Take 1-2 tablets by mouth every 6 (six) hours as needed for moderate pain. Patient not taking: Reported on 02/02/2017 06/23/16   Mariel Aloe, MD  nystatin (MYCOSTATIN/NYSTOP) powder Apply topically 3 (three) times daily. Patient not taking: Reported on 02/02/2017 09/19/16   Debbe Odea, MD  pantoprazole (PROTONIX) 40 MG tablet Take 1 tablet (40 mg total) by mouth daily. Patient not taking: Reported on 02/02/2017 09/19/16   Debbe Odea, MD  predniSONE (DELTASONE) 10 MG tablet 40 mg x 2 days 30 mg x 4 days 20 mg x 4 days 10 mg x 4 days Patient not taking: Reported on 02/02/2017 09/19/16   Debbe Odea, MD    Physical Exam: Vitals:   02/02/17 2345 02/03/17 0000 02/03/17 0015 02/03/17 0030  BP: (!) 100/59 103/65 (!) 113/52 117/68  Pulse: (!) 114 (!) 112 (!) 115 (!) 120  Resp:      Temp:      TempSrc:      SpO2: 96% 99% 100% 96%      Constitutional: Moderately built and nourished. Vitals:   02/02/17 2345 02/03/17 0000 02/03/17 0015 02/03/17 0030  BP: (!) 100/59 103/65 (!) 113/52 117/68  Pulse: (!) 114 (!) 112 (!) 115 (!) 120  Resp:      Temp:      TempSrc:      SpO2: 96% 99% 100% 96%   Eyes: Anicteric no pallor. ENMT: No discharge from the ears eyes nose or mouth. Neck: JVD not appreciated no mass felt. Respiratory: Mild  expiratory wheeze no crepitations. Cardiovascular: S1-S2 heard no murmurs appreciated. Abdomen: Soft nontender bowel sounds present. Musculoskeletal: No edema.  No joint effusion. Skin: No rash. Neurologic: Alert awake oriented to her name follows commands moves all extremities. Psychiatric: Mildly confused.   Labs on Admission: I have personally reviewed following labs and imaging studies  CBC: Recent Labs  Lab 02/02/17 1724  WBC 10.6*  NEUTROABS 8.8*  HGB 8.3*  HCT 32.2*  MCV 95.8  PLT 831   Basic Metabolic Panel: Recent Labs  Lab 02/02/17 1724  NA 135  K 3.8  CL 73*  CO2 46*  GLUCOSE 163*  BUN 6  CREATININE 0.62  CALCIUM 9.2   GFR: CrCl cannot  be calculated (Unknown ideal weight.). Liver Function Tests: Recent Labs  Lab 02/02/17 1724  AST 12*  ALT 9*  ALKPHOS 78  BILITOT 0.8  PROT 7.6  ALBUMIN 2.1*   No results for input(s): LIPASE, AMYLASE in the last 168 hours. No results for input(s): AMMONIA in the last 168 hours. Coagulation Profile: No results for input(s): INR, PROTIME in the last 168 hours. Cardiac Enzymes: No results for input(s): CKTOTAL, CKMB, CKMBINDEX, TROPONINI in the last 168 hours. BNP (last 3 results) No results for input(s): PROBNP in the last 8760 hours. HbA1C: No results for input(s): HGBA1C in the last 72 hours. CBG: No results for input(s): GLUCAP in the last 168 hours. Lipid Profile: No results for input(s): CHOL, HDL, LDLCALC, TRIG, CHOLHDL, LDLDIRECT in the last 72 hours. Thyroid Function Tests: No results for input(s): TSH, T4TOTAL, FREET4, T3FREE, THYROIDAB in the last 72 hours. Anemia Panel: No results for input(s): VITAMINB12, FOLATE, FERRITIN, TIBC, IRON, RETICCTPCT in the last 72 hours. Urine analysis:    Component Value Date/Time   COLORURINE YELLOW 02/02/2017 1857   APPEARANCEUR TURBID (A) 02/02/2017 1857   LABSPEC 1.025 02/02/2017 1857   PHURINE 6.0 02/02/2017 1857   GLUCOSEU NEGATIVE 02/02/2017 1857    HGBUR MODERATE (A) 02/02/2017 1857   BILIRUBINUR SMALL (A) 02/02/2017 1857   KETONESUR 15 (A) 02/02/2017 1857   PROTEINUR 100 (A) 02/02/2017 1857   NITRITE POSITIVE (A) 02/02/2017 1857   LEUKOCYTESUR MODERATE (A) 02/02/2017 1857   Sepsis Labs: @LABRCNTIP (procalcitonin:4,lacticidven:4) )No results found for this or any previous visit (from the past 240 hour(s)).   Radiological Exams on Admission: Ct Head Wo Contrast  Result Date: 02/02/2017 CLINICAL DATA:  .61 year old female with altered mental status. EXAM: CT HEAD WITHOUT CONTRAST TECHNIQUE: Contiguous axial images were obtained from the base of the skull through the vertex without intravenous contrast. COMPARISON:  Head CT dated 09/16/2016 FINDINGS: Evaluation of this exam is limited due to motion artifact. Brain: The ventricles and sulci appropriate size for patient's age. The gray-white matter discrimination is preserved. There is no acute intracranial hemorrhage. No mass effect or midline shift. No extra-axial fluid collection. Vascular: No hyperdense vessel or unexpected calcification. Skull: Normal. Negative for fracture or focal lesion. Sinuses/Orbits: No acute finding. Other: None IMPRESSION: No acute intracranial pathology on a motion degraded study. Electronically Signed   By: Anner Crete [name]   On: 02/02/2017 23:32   Ct Angio Chest Pe W Or Wo Contrast  Result Date: 02/02/2017 CLINICAL DATA:  Altered mental status hypoxia EXAM: CT ANGIOGRAPHY CHEST WITH CONTRAST TECHNIQUE: Multidetector CT imaging of the chest was performed using the standard protocol during bolus administration of intravenous contrast. Multiplanar CT image reconstructions and MIPs were obtained to evaluate the vascular anatomy. CONTRAST:  133mL ISOVUE-370 IOPAMIDOL (ISOVUE-370) INJECTION 76% COMPARISON:  02/02/2017, 09/17/2006 FINDINGS: Cardiovascular: Satisfactory opacification of the pulmonary arteries to the segmental level. No evidence of pulmonary embolism.  Left-sided pulmonary vessels are attenuated by diffuse left lung consolidation but no filling defects are seen. Aortic atherosclerosis. Coronary artery calcification. Normal heart size. No large pericardial effusion. Mediastinum/Nodes: No thyroid mass. Midline trachea. Similar appearance of right precarinal lymph nodes. Increased AP window adenopathy, with lymph nodes measuring up to 13 mm. Esophagus unremarkable Lungs/Pleura: Large left pleural effusion. Diffuse consolidation throughout the left lung. Central low density round area in the central left upper lobe is suspected to represent the patient's known lung mass, this region measures approximately 7 cm but it is difficult to distinguish from adjacent  consolidated lung parenchyma. Moderate emphysema in the right lung Upper Abdomen: Ill-defined nodularity of the left adrenal gland measuring about 11 mm. Small stones in the gallbladder. Musculoskeletal: Sternotomy changes. No acute or suspicious bone lesion. Review of the MIP images confirms the above findings. IMPRESSION: 1. No definite acute pulmonary embolus is visualized. Diffuse attenuation and narrowing of left-sided pulmonary vessels due to mass and consolidative process but no filling defects are seen. 2. Large left pleural effusion. Diffuse consolidation of the left lung parenchyma. Hypodense masslike area in the central left upper lobe is suspected to represent the patient's lung mass, it is difficult to distinguish from adjacent atelectatic lung or pneumonia, suspect that the mass is increased in size. Increased AP window adenopathy. 3. Ill-defined left adrenal gland nodule measuring 11 mm 4. Small gallstones 5. Emphysema Aortic Atherosclerosis (ICD10-I70.0) and Emphysema (ICD10-J43.9). Electronically Signed   By: Donavan Foil [name]   On: 02/02/2017 23:40   Dg Chest Portable 1 View  Result Date: 02/02/2017 CLINICAL DATA:  Shortness of breath EXAM: PORTABLE CHEST 1 VIEW COMPARISON:  09/17/2016, CT  09/16/2016 FINDINGS: Diffuse opacity of the left thorax. Cardiomediastinal silhouette obscured. Vascular congestion on the right. Minimal atelectasis at the right base. Post sternotomy changes. Aortic atherosclerosis. IMPRESSION: 1. Diffuse opacification of left thorax may relate to combination of large pleural effusion and underlying atelectasis, pneumonia, or mass 2. Minimal atelectasis at the right base Electronically Signed   By: Donavan Foil [name]   On: 02/02/2017 17:51    EKG: Independently reviewed.  Normal sinus rhythm with nonspecific ST-T changes.  Assessment/Plan Principal Problem:   Acute respiratory failure with hypoxia (HCC) Active Problems:   Type 2 diabetes mellitus with complication, with long-term current use of insulin (HCC)   COPD exacerbation (HCC)   Chronic diastolic heart failure (HCC)   Acute metabolic encephalopathy    1. Acute respiratory failure with hypoxia likely a combination of COPD pneumonia and large left pleural effusion with lung mass.  Patient is placed on empiric antibiotics for pneumonia and nebulizer treatment for COPD.  I have consulted pulmonary critical care to further address patient's left pleural effusion and mass. 2. Acute encephalopathy likely from UTI and pneumonia and hypoxia from respiratory failure.  Patient is refusing further attempts of ABG.  Patient is also refusing BiPAP. 3. Lung mass with pleural effusion -pulmonary critical care consulted. 4. Diabetes mellitus type 2 -we will keep patient on sliding scale coverage for now and closely follow CBGs. 5. History of diastolic CHF -patient is on as needed Lasix.  Closely observe clinically to see if patient needs Lasix. 6. Chronic anemia normocytic normochromic with mild worsening -follow CBC. 7. 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  If thoracentesis not planned then may have to restart anticoagulation.   DVT prophylaxis:  Patient is usually on Lovenox a band which is held now in anticipation of thoracentesis.  May need to restart after the procedure or if procedures are planned.  Pulmonary critical care has been consulted. Code Status: DNR. Family Communication: Patient's husband. Disposition Plan: To be determined. Consults called: Pulmonary critical care and social worker. Admission status: Inpatient.   Rise Patience MD [facility] Pager [phone].  If 7PM-7AM, please contact night-coverage [email or web address] Password [email or web address]  02/03/2017, 1:54 AM

## 2017-02-03 NOTE — Procedures (Signed)
Interventional Radiology Procedure Note  Procedure: US guided left thoracentesis, ~1280cc of thin serosanguinous fluid aspirated Complications: None Recommendations:  - CXR now. - Do not submerge for 7 days - Routine wound care   Signed,  Dulcy Fanny. Earleen Newport, DO

## 2017-02-03 NOTE — ED Notes (Signed)
Pt husband called stating pt wanted to know if she can have a sandwich. This tech informed pt husband the nurse would have to be ask.

## 2017-02-03 NOTE — ED Notes (Signed)
Pt's husband requesting we give patient sandwich and Ice water, pt continues to move NRB mask into her mouth, pt reminded to keep mask on her face to maximize oxygen exposure. Pt also pointing to ceiling asking if this nurse sees the bird on the ceiling. Husband at bedside.

## 2017-02-03 NOTE — Consult Note (Signed)
Name: [name] MRN: [id number] DOB: [birth year]    ADMISSION DATE:  02/02/2017 CONSULTATION DATE:  02/03/2017  REFERRING MD : Sarajane Jews   CHIEF COMPLAINT:   Acute on chronic respiratory failure 2/2 large L pleural effusion   BRIEF PATIENT DESCRIPTION:  Elderly female asleep in bed, arouses to call of name but mumbles incoherently. She is on 5L [place] with sat of 94% and RR of 19    SIGNIFICANT EVENTS  02/02/2017>> Admission  STUDIES:   02/03/2017>> US Guided  Left Therapeutic and Diagnostic Thoracentesis>> 1280 cc's  09/17/2016 Thoracentesis: REACTIVE MESOTHELIAL CELLS PRESENT.  CT Angio 12/28 No definite acute pulmonary embolus is visualized. Diffuse attenuation and narrowing of left-sided pulmonary vessels due to mass and consolidative process but no filling defects are seen. Large left pleural effusion. Diffuse consolidation of the left lung parenchyma. Hypodense masslike area in the central left upper lobe is suspected to represent the patient's lung mass, it is difficult to distinguish from adjacent atelectatic lung or pneumonia, suspect that the mass is increased in size. Increased AP window adenopathy. Ill-defined left adrenal gland nodule measuring 11 mm Emphysema   HISTORY OF PRESENT ILLNESS:   [name] is a 61 y.o. female former smoker with a 120 pack year smoking history ( Quit 2011)    with history of COPD, diabetes mellitus type 2, embolic CVA, CHF and known history of lung mass ( Discovered 11/2015 during hospital admission)  was brought to the ER the patient was found to be increasingly confused by patient's husband.  As per the husband patient has not been ambulatory as usual which is progressively worse last 2 weeks. Patient also has been increasingly short of breath. Denied chest pain productive cough fever chills or any loss of consciousness nausea vomiting or diarrhea upon admission. CTA chest was done which was negative for PE but was positive for  Diffuse attenuation and narrowing of left-sided pulmonary vessels due to known pulmonary  mass and consolidative process, large left pleural effusion,possible pneumonia and increased AP adenopathy and left adrenal gland measuring 11 mm. Pt's dyspnea did not improve, and she underwent a therapeutic/diagnostic thoracentesis 12/29 with removal of 1280 cc's of thin serosanguinous fluid . Fluid was sent for evaluation. Previous Thora done 09/2016 did not reveal malignant cells. CCM were asked to consult for lung mass and pleural effusion, COPD exacerbation and  Suspected pneumonia.  Per husband, patient is non-compliant with her pulmonary nebulizer treatments. She uses her Pro Air and Combivent inhalers " Way too much" Additionally she does not maintain good follow up regarding medical appointments. Husband states she cancels appointments frequently.     PAST MEDICAL HISTORY :   has a past medical history of Acute and chronic respiratory failure with hypoxia ([place]), Acute MI ([place]), Arthritis, CHF (congestive heart failure) ([place]), COPD (chronic obstructive pulmonary disease) ([place]), Coronary artery disease, Diabetes mellitus without complication ([place]), Mass of left lung, Shortness of breath, and Stroke ([place]) (TIA ).  has a past surgical history that includes Abdominal hysterectomy; Cardiac catheterization; and Coronary artery bypass graft. Prior to Admission medications   Medication Sig Start Date End Date Taking? Authorizing Provider  albuterol (PROVENTIL) (2.5 MG/3ML) 0.083% nebulizer solution Take 3 mLs (2.5 mg total) by nebulization every 2 (two) hours as needed for wheezing. 09/19/16  Yes Debbe Odea, MD  atorvastatin (LIPITOR) 20 MG tablet Take 1 tablet (20 mg total) by mouth daily. 02/25/13  Yes Elsie Stain, MD  furosemide (LASIX) 20 MG tablet Take 1 tablet (  20 mg total) by mouth daily as needed. 09/19/16 09/19/17 Yes Debbe Odea, MD  gabapentin (NEURONTIN) 100 MG capsule Take 100 mg by mouth 3  (three) times daily. 03/18/15 06/14/17 Yes [provider]  insulin aspart (NOVOLOG FLEXPEN) 100 UNIT/ML SOPN FlexPen Inject 6 Units into the skin 3 (three) times daily with meals. Patient taking differently: Inject 20 Units into the skin 2 (two) times daily.  12/18/12  Yes Gherghe, Vella Redhead, MD  Insulin Degludec (TRESIBA FLEXTOUCH) 100 UNIT/ML SOPN Inject 20 Units into the skin daily.   Yes [provider]  Ipratropium-Albuterol (COMBIVENT RESPIMAT) 20-100 MCG/ACT AERS respimat Inhale 1-2 puffs into the lungs 4 (four) times daily. 09/19/16  Yes Rizwan, Eunice Blase, MD  ipratropium-albuterol (DUONEB) 0.5-2.5 (3) MG/3ML SOLN Take 3 mLs by nebulization every 4 (four) hours as needed. Patient taking differently: Take 3 mLs by nebulization every 4 (four) hours as needed (for shortness of breath).  09/19/16  Yes Debbe Odea, MD  isosorbide mononitrate (IMDUR) 60 MG 24 hr tablet Take 60 mg by mouth daily.   Yes [provider]  metoprolol tartrate (LOPRESSOR) 25 MG tablet Take 0.5 tablets (12.5 mg total) by mouth 2 (two) times daily. Patient taking differently: Take 12.5 mg by mouth daily.  09/19/16  Yes Debbe Odea, MD  Potassium Chloride ER 20 MEQ TBCR Take 20 mEq by mouth daily as needed. Patient taking differently: Take 20 mEq by mouth daily as needed (with furosemide).  09/19/16  Yes Debbe Odea, MD  Rivaroxaban (XARELTO) 20 MG TABS tablet Take 1 tablet (20 mg total) by mouth daily. 02/25/13  Yes Elsie Stain, MD  HYDROcodone-acetaminophen (NORCO/VICODIN) 5-325 MG tablet Take 1-2 tablets by mouth every 6 (six) hours as needed for moderate pain. Patient not taking: Reported on 02/02/2017 06/23/16   Mariel Aloe, MD  nystatin (MYCOSTATIN/NYSTOP) powder Apply topically 3 (three) times daily. Patient not taking: Reported on 02/02/2017 09/19/16   Debbe Odea, MD  pantoprazole (PROTONIX) 40 MG tablet Take 1 tablet (40 mg total) by mouth daily. Patient not taking: Reported on  02/02/2017 09/19/16   Debbe Odea, MD  predniSONE (DELTASONE) 10 MG tablet 40 mg x 2 days 30 mg x 4 days 20 mg x 4 days 10 mg x 4 days Patient not taking: Reported on 02/02/2017 09/19/16   Debbe Odea, MD   Allergies  Allergen Reactions  . Ciprofloxacin Rash    REACTION: hives/rash    FAMILY HISTORY:  family history includes COPD in her sister; Diabetes in her brother; Emphysema in her brother and sister; Heart attack in her sister; Lung cancer in her brother and brother. SOCIAL HISTORY:  reports that she quit smoking about 7 years ago. Her smoking use included cigarettes. She has a 120.00 pack-year smoking history. she has never used smokeless tobacco. She reports that she does not drink alcohol.  REVIEW OF SYSTEMS:   Unable as patient is confused.   SUBJECTIVE:  Per patient she does not feel any better since thoracentesis although oxygen has been decreased from 7 L to 5 L with saturation of 94% and per husband there is decrease in WOB.Marland Kitchen   VITAL SIGNS: Temp:  [98.1 F (36.7 C)-99.1 F (37.3 C)] 98.2 F (36.8 C) (12/29 0641) Pulse Rate:  [99-125] 113 (12/29 0959) Resp:  [14-28] 20 (12/29 0641) BP: (87-172)/(27-118) 172/118 (12/29 1342) SpO2:  [92 %-100 %] 99 % (12/29 1517) Weight:  [159 lb 13.3 oz (72.5 kg)] 159 lb 13.3 oz (72.5 kg) (12/29 6314)  PHYSICAL EXAMINATION: General:  Elderly female supine in bed, confused and wearing nasal oxygen Neuro:  Arouses easily, Selectively follows commands, MAE x 4, Confused to person, place and time. HEENT: NCAT, Negative JVD, wearing nasal oxygen Cardiovascular:  S1, S2, RRR, No RMG Lungs: Bilateral excursion, Coarse with wheezing noted, diminished per bases  Abdomen:  Soft, tender to palpation, non-distended, obese Musculoskeletal:  No obvious deformities Skin:Thin, warm, dry and intact, no lesions or rash noted.  Recent Labs  Lab 02/02/17 1724 02/03/17 0540  NA 135 137  K 3.8 4.0  CL 73* 75*  CO2 46* 50*  BUN 6 5*    CREATININE 0.62 0.58  GLUCOSE 163* 128*   Recent Labs  Lab 02/02/17 1724 02/03/17 0540  HGB 8.3* 8.5*  HCT 32.2* 32.6*  WBC 10.6* 12.5*  PLT 311 317   Dg Chest 1 View  Result Date: 02/03/2017 CLINICAL DATA:  Status post left thoracentesis EXAM: CHEST 1 VIEW COMPARISON:  02/02/2017 FINDINGS: Right lung is again well aerated. Cardiac shadow is stable. Postsurgical changes are again noted. Interval thoracentesis has been performed. A small residual pleural effusion is noted inferiorly. The large central mass lesion is noted which corresponds to that seen on prior CT examination. No pneumothorax is noted. IMPRESSION: No evidence of pneumothorax following thoracentesis. Small residual pleural effusion is noted. Large central mass lesion on the left. Electronically Signed   By: Inez Catalina [name]   On: 02/03/2017 15:25   Ct Head Wo Contrast  Result Date: 02/02/2017 CLINICAL DATA:  .62 year old female with altered mental status. EXAM: CT HEAD WITHOUT CONTRAST TECHNIQUE: Contiguous axial images were obtained from the base of the skull through the vertex without intravenous contrast. COMPARISON:  Head CT dated 09/16/2016 FINDINGS: Evaluation of this exam is limited due to motion artifact. Brain: The ventricles and sulci appropriate size for patient's age. The gray-white matter discrimination is preserved. There is no acute intracranial hemorrhage. No mass effect or midline shift. No extra-axial fluid collection. Vascular: No hyperdense vessel or unexpected calcification. Skull: Normal. Negative for fracture or focal lesion. Sinuses/Orbits: No acute finding. Other: None IMPRESSION: No acute intracranial pathology on a motion degraded study. Electronically Signed   By: Anner Crete [name]   On: 02/02/2017 23:32   Ct Angio Chest Pe W Or Wo Contrast  Result Date: 02/02/2017 CLINICAL DATA:  Altered mental status hypoxia EXAM: CT ANGIOGRAPHY CHEST WITH CONTRAST TECHNIQUE: Multidetector CT imaging of the  chest was performed using the standard protocol during bolus administration of intravenous contrast. Multiplanar CT image reconstructions and MIPs were obtained to evaluate the vascular anatomy. CONTRAST:  135mL ISOVUE-370 IOPAMIDOL (ISOVUE-370) INJECTION 76% COMPARISON:  02/02/2017, 09/17/2006 FINDINGS: Cardiovascular: Satisfactory opacification of the pulmonary arteries to the segmental level. No evidence of pulmonary embolism. Left-sided pulmonary vessels are attenuated by diffuse left lung consolidation but no filling defects are seen. Aortic atherosclerosis. Coronary artery calcification. Normal heart size. No large pericardial effusion. Mediastinum/Nodes: No thyroid mass. Midline trachea. Similar appearance of right precarinal lymph nodes. Increased AP window adenopathy, with lymph nodes measuring up to 13 mm. Esophagus unremarkable Lungs/Pleura: Large left pleural effusion. Diffuse consolidation throughout the left lung. Central low density round area in the central left upper lobe is suspected to represent the patient's known lung mass, this region measures approximately 7 cm but it is difficult to distinguish from adjacent consolidated lung parenchyma. Moderate emphysema in the right lung Upper Abdomen: Ill-defined nodularity of the left adrenal gland measuring about 11 mm. Small stones in  the gallbladder. Musculoskeletal: Sternotomy changes. No acute or suspicious bone lesion. Review of the MIP images confirms the above findings. IMPRESSION: 1. No definite acute pulmonary embolus is visualized. Diffuse attenuation and narrowing of left-sided pulmonary vessels due to mass and consolidative process but no filling defects are seen. 2. Large left pleural effusion. Diffuse consolidation of the left lung parenchyma. Hypodense masslike area in the central left upper lobe is suspected to represent the patient's lung mass, it is difficult to distinguish from adjacent atelectatic lung or pneumonia, suspect that the  mass is increased in size. Increased AP window adenopathy. 3. Ill-defined left adrenal gland nodule measuring 11 mm 4. Small gallstones 5. Emphysema Aortic Atherosclerosis (ICD10-I70.0) and Emphysema (ICD10-J43.9). Electronically Signed   By: Donavan Foil [name]   On: 02/02/2017 23:40   Dg Chest Portable 1 View  Result Date: 02/02/2017 CLINICAL DATA:  Shortness of breath EXAM: PORTABLE CHEST 1 VIEW COMPARISON:  09/17/2016, CT 09/16/2016 FINDINGS: Diffuse opacity of the left thorax. Cardiomediastinal silhouette obscured. Vascular congestion on the right. Minimal atelectasis at the right base. Post sternotomy changes. Aortic atherosclerosis. IMPRESSION: 1. Diffuse opacification of left thorax may relate to combination of large pleural effusion and underlying atelectasis, pneumonia, or mass 2. Minimal atelectasis at the right base Electronically Signed   By: Donavan Foil [name]   On: 02/02/2017 17:51    ASSESSMENT / PLAN: A: Acute on Chronic Respiratory Failure 2/2 large left pleural Effusion, known Left  lung mass which has increased in size since last  imaging 09/2016. Initially discovered 11/2015. Pt did not want work up>> highly suspicious for  primary bronchogenic carcinoma with nodal metastasis.  Previous thoracentesis ( 09/2016) was positive for reactive mesothelial cells, but not malignancy. S/P Thoracentesis 02/03/2017 with  1280 cc serosanguinous fluid>> sent for evaluation COPD exacerbation>> home oxygen at 5 L>> non-compliant Suspected pneumonia AMS/ Acute Encephalopathy vs Hypercapnea DNR already established  Plan: Titrate oxygen for saturations of 88-92% Follow up CXR 12/30 CXR prn Follow pleural fluid for cytology Not candidate for EBUS / Bronch for biopsy  due to ES COPD Continue Empiric antibiotics Continue IV steroids Continue scheduled BD Consider ABG and BiPAP as CO2 on chemistry is 50 and hypercarbia may be reason for AMS. Palliative Care consult ABG now Bi Pap now and  QHS  Magdalen Spatz, AGACNP-BC Pulmonary and [place] Pager: [phone]  02/03/2017, 3:38 PM

## 2017-02-04 ENCOUNTER — Other Ambulatory Visit: Payer: Self-pay

## 2017-02-04 ENCOUNTER — Inpatient Hospital Stay (HOSPITAL_COMMUNITY): Payer: BLUE CROSS/BLUE SHIELD

## 2017-02-04 DIAGNOSIS — J90 Pleural effusion, not elsewhere classified: Principal | ICD-10-CM

## 2017-02-04 DIAGNOSIS — Z515 Encounter for palliative care: Secondary | ICD-10-CM

## 2017-02-04 DIAGNOSIS — E118 Type 2 diabetes mellitus with unspecified complications: Secondary | ICD-10-CM

## 2017-02-04 LAB — BASIC METABOLIC PANEL
Anion gap: 15 (ref 5–15)
BUN: 10 mg/dL (ref 6–20)
CHLORIDE: 76 mmol/L — AB (ref 101–111)
CO2: 44 mmol/L — ABNORMAL HIGH (ref 22–32)
Calcium: 9.1 mg/dL (ref 8.9–10.3)
Creatinine, Ser: 0.8 mg/dL (ref 0.44–1.00)
GFR calc Af Amer: >60 mL/min (ref >60)
GLUCOSE: 201 mg/dL — AB (ref 65–99)
POTASSIUM: 4.1 mmol/L (ref 3.5–5.1)
Sodium: 135 mmol/L (ref 135–145)

## 2017-02-04 LAB — CBC
HCT: 29.7 % — ABNORMAL LOW (ref 36.0–46.0)
Hemoglobin: 7.8 g/dL — ABNORMAL LOW (ref 12.0–15.0)
MCH: 25.1 pg — AB (ref 26.0–34.0)
MCHC: 26.3 g/dL — AB (ref 30.0–36.0)
MCV: 95.5 fL (ref 78.0–100.0)
PLATELETS: 275 10*3/uL (ref 150–400)
RBC: 3.11 MIL/uL — AB (ref 3.87–5.11)
RDW: 14.6 % (ref 11.5–15.5)
WBC: 7.4 10*3/uL (ref 4.0–10.5)

## 2017-02-04 LAB — GLUCOSE, CAPILLARY
GLUCOSE-CAPILLARY: 298 mg/dL — AB (ref 65–99)
GLUCOSE-CAPILLARY: 293 mg/dL — AB (ref 65–99)
Glucose-Capillary: 184 mg/dL — ABNORMAL HIGH (ref 65–99)
Glucose-Capillary: 229 mg/dL — ABNORMAL HIGH (ref 65–99)

## 2017-02-04 LAB — URINE CULTURE

## 2017-02-04 MED ORDER — LORAZEPAM 2 MG/ML IJ SOLN
0.5000 mg | INTRAMUSCULAR | Status: DC | PRN
  Administered 2017-02-08: 0.5 mg via INTRAVENOUS
  Filled 2017-02-04: qty 1

## 2017-02-04 MED ORDER — IPRATROPIUM-ALBUTEROL 0.5-2.5 (3) MG/3ML IN SOLN
3.0000 mL | Freq: Four times a day (QID) | RESPIRATORY_TRACT | Status: DC
  Administered 2017-02-04 – 2017-02-07 (×12): 3 mL via RESPIRATORY_TRACT
  Filled 2017-02-04 (×13): qty 3

## 2017-02-04 MED ORDER — RIVAROXABAN 20 MG PO TABS: 20.0000 mg | ORAL_TABLET | Freq: Every day | ORAL | Status: DC

## 2017-02-04 MED ORDER — INSULIN GLARGINE 100 UNIT/ML ~~LOC~~ SOLN
5.0000 [IU] | Freq: Every day | SUBCUTANEOUS | Status: DC
  Administered 2017-02-04 – 2017-02-05 (×2): 5 [IU] via SUBCUTANEOUS
  Filled 2017-02-04 (×2): qty 0.05

## 2017-02-04 MED ORDER — RIVAROXABAN 20 MG PO TABS
20.0000 mg | ORAL_TABLET | Freq: Every day | ORAL | Status: DC
  Administered 2017-02-04 – 2017-02-09 (×6): 20 mg via ORAL
  Filled 2017-02-04 (×6): qty 1

## 2017-02-04 NOTE — Progress Notes (Addendum)
PROGRESS NOTE  Jovan Schickling ZOX:[id number] DOB: 01-24-1956 DOA: 02/02/2017 PCP: Brand Males, MD  Brief Narrative: 20yow PMH COPD, lung mass presented with increasing confusion and SOB.  Assessment/Plan Acute on chronic hypoxic, hypercapnic resp failure secondary to large left pleural effusion ("white out"), known left lung mass, suspected pneumonia, COPD exacerbation. S/p large volume thoracentesis 12/29 - appears much better today - continue HFNC, BiPAP QHS - continue empiric abx; steroids, bronchodilators  Acute encephalopathy secondary to above - much improved; continue treatments as above  Left lung mass. Noted on office visit 04/2016, hospitalization 06/2016 and 09/2016 but patient put off follow-up.  - agree with pulmonology note; will f/u cytology, involve PMT. Pt much better today but not ready to participate in [place] discussion today  Normocytic anemia/anemia of chronic disease - Hgb trending down; no bleeding. Check CBC in AM. Transfuse for Hgb <7.  Anxiety, previously needed Precedem to tolerate BiPAP. - stable  COPD, chronic hypoxic resp failure on home oxygen 5 L  DM type 2 on insulin - CBG stable 12/30, continue SSI 12/30 - will add low dose Lantus  Chronic diastolic CHF - appears compensated 12/30, monitor I/O - Lasix daily as needed  PMH CVA on Xarelto - resume Xarelto    Aortic atherosclerosis - continue atorvastatin  Body mass index is 24.17 kg/m.   Much better but short-term prognosis remains guarded; long-term is poor. Advance diet.  DVT prophylaxis: SCDs Code Status: DNR no intubation  Family Communication: husband at bedside Disposition Plan: pending    Murray Hodgkins, MD  [facility] Direct contact: [phone] --[place] [place]  --[email or web address]; password [email or web address]  7PM-7AM contact night coverage as above 02/04/2017, 8:58 AM  LOS: 2 days   Consultants:  Pulmonology   Procedures:  12/29 US guided left thoracentesis,  ~1280cc of thin serosanguinous fluid aspirated  Antimicrobials:  Zosyn  12/29 >>  Vancomycin 12/29 >>  Interval history/Subjective: Rough night with anxiety per husband; better today. Patient awake, alert, feels poorly No new issues per RN  Objective: Vitals:  Vitals:   02/04/17 0822 02/04/17 0825  BP:    Pulse:    Resp:    Temp:    SpO2: 94% 95%    Exam:  Constitutional:   . Appears calm, uncomfortable but not toxic Eyes:  . pupils and irises appear normal . Normal lids  ENMT:  . grossly normal hearing  Respiratory:  . Poor air movement; no w/r/r. . Respiratory effort moderately increased. Cardiovascular:  . RRR, no m/r/g . No LE extremity edema   Telemetry SR Musculoskeletal:  . Digits/nails BUE: no clubbing, cyanosis, petechiae, infection. Long poorly groomed nails . RUE, LUE, RLE, LLE   o strength and tone normal, no atrophy, no abnormal movements Skin:  . No rashes, lesions, ulcers . palpation of skin: no induration or nodules Psychiatric:  . Mental status o Mood, affect appropriate o Orientation to person, place, December, not year   I have personally reviewed the following:    Labs:  CBG stable  BMP unremarkable except CO2 44  Hgb 8.5 >> 7.8  ABG 7.330/101/110  Imaging studies:  CXR independently reviewed: large left lung mass, pleural effusion has significantly decreased  Medical tests:    Scheduled Meds: . albuterol  2.5 mg Nebulization Q4H  . atorvastatin  20 mg Oral Daily  . budesonide (PULMICORT) nebulizer solution  0.25 mg Nebulization BID  . gabapentin  100 mg Oral TID  . insulin aspart  0-9 Units Subcutaneous TID WC  .  ipratropium  0.5 mg Nebulization Q4H  . isosorbide mononitrate  60 mg Oral Daily  . methylPREDNISolone (SOLU-MEDROL) injection  40 mg Intravenous Q6H  . metoprolol tartrate  12.5 mg Oral BID   Continuous Infusions: . piperacillin-tazobactam (ZOSYN)  IV 3.375 g (02/04/17 0601)  . vancomycin Stopped  (02/03/17 2145)    Principal Problem:   Acute on chronic respiratory failure with hypoxia and hypercapnia (HCC) Active Problems:   Type 2 diabetes mellitus with complication, with long-term current use of insulin (HCC)   Chronic respiratory failure (HCC)   Chronic diastolic CHF (congestive heart failure) (HCC)   Mass of left lung   Diabetes mellitus with complication (HCC)   COPD exacerbation (HCC)   Chronic diastolic heart failure (HCC)   Acute metabolic encephalopathy   Pleural effusion, left   Pneumonia   LOS: 2 days

## 2017-02-04 NOTE — Progress Notes (Signed)
Rt placed pt on bipap and pt tolerating well. 14/5, set RR-8, and 40%O2. VS stable and pt resting comfortably. RT to cont to monitor.

## 2017-02-04 NOTE — Consult Note (Signed)
Name: [name] MRN: [id number] DOB: 1955/04/25    ADMISSION DATE:  02/02/2017 CONSULTATION DATE:  02/03/2017  REFERRING MD : Sarajane Jews   CHIEF COMPLAINT:   Acute on chronic respiratory failure 2/2 large L pleural effusion   BRIEF PATIENT DESCRIPTION:  Elderly female asleep in bed, arouses to call of name but mumbles incoherently. She is on 5L [place] with sat of 94% and RR of 19.    SIGNIFICANT EVENTS  02/02/2017>> Admission  STUDIES:   02/03/2017>> US Guided  Left Therapeutic and Diagnostic Thoracentesis>> 1280 cc's  09/17/2016 Thoracentesis: REACTIVE MESOTHELIAL CELLS PRESENT.  CT Angio 12/28 No definite acute pulmonary embolus is visualized. Diffuse attenuation and narrowing of left-sided pulmonary vessels due to mass and consolidative process but no filling defects are seen. Large left pleural effusion. Diffuse consolidation of the left lung parenchyma. Hypodense masslike area in the central left upper lobe is suspected to represent the patient's lung mass, it is difficult to distinguish from adjacent atelectatic lung or pneumonia, suspect that the mass is increased in size. Increased AP window adenopathy. Ill-defined left adrenal gland nodule measuring 11 mm Emphysema   HISTORY OF PRESENT ILLNESS:   [name] is a 61 y.o. female former smoker with a 120 pack year smoking history ( Quit 2011)    with history of COPD, diabetes mellitus type 2, embolic CVA, CHF and known history of lung mass ( Discovered 11/2015 during hospital admission)  was brought to the ER the patient was found to be increasingly confused by patient's husband.  As per the husband patient has not been ambulatory as usual which is progressively worse last 2 weeks. Patient also has been increasingly short of breath. Denied chest pain productive cough fever chills or any loss of consciousness nausea vomiting or diarrhea upon admission. CTA chest was done which was negative for PE but was positive for  Diffuse attenuation and narrowing of left-sided pulmonary vessels due to known pulmonary  mass and consolidative process, large left pleural effusion,possible pneumonia and increased AP adenopathy and left adrenal gland measuring 11 mm. Pt's dyspnea did not improve, and she underwent a therapeutic/diagnostic thoracentesis 12/29 with removal of 1280 cc's of thin serosanguinous fluid . Fluid was sent for evaluation. Previous Thora done 09/2016 did not reveal malignant cells. CCM were asked to consult for lung mass and pleural effusion, COPD exacerbation and  Suspected pneumonia. ABG 12/29  revealed CO2 of 101. Bi Pap was initiated with improvement in mental status.  Per husband, patient is non-compliant with her pulmonary nebulizer treatments. She uses her Pro Air and Combivent inhalers " Way too much" Additionally she does not maintain good follow up regarding medical appointments. Husband states she cancels appointments frequently.    Subjective More alert, remains confused. Saturations are 88% on 3L [place]. Asking for her Combi vent inhaler.  VITAL SIGNS: Temp:  [98 F (36.7 C)-99.4 F (37.4 C)] 98 F (36.7 C) (12/30 1105) Pulse Rate:  [86-102] 96 (12/30 1105) Resp:  [14-27] 20 (12/30 0415) BP: (93-172)/(49-118) 105/55 (12/30 1105) SpO2:  [91 %-99 %] 98 % (12/30 1119) FiO2 (%):  [40 %] 40 % (12/30 0350) Weight:  [154 lb 5.2 oz (70 kg)] 154 lb 5.2 oz (70 kg) (12/30 0415)  PHYSICAL EXAMINATION: General:  Elderly female supine in bed eating lunch, wearing nasal oxygen Neuro:  Awake and alert,  follows simple commands, MAE x 4, Confused regarding  place and time HEENT: NCAT, Negative JVD, wearing nasal oxygen Cardiovascular:  S1, S2, RRR, No  RMG Lungs: Bilateral excursion, Coarse with exp. wheezing noted, diminished per bases, mild accessory muscle use  Abdomen:  Soft, tender to palpation, non-distended, obese Musculoskeletal:  No obvious deformities Skin:Thin, warm, dry and intact, no lesions or  rash noted.  Recent Labs  Lab 02/02/17 1724 02/03/17 0540 02/04/17 0251  NA 135 137 135  K 3.8 4.0 4.1  CL 73* 75* 76*  CO2 46* 50* 44*  BUN 6 5* 10  CREATININE 0.62 0.58 0.80  GLUCOSE 163* 128* 201*   Recent Labs  Lab 02/02/17 1724 02/03/17 0540 02/04/17 0251  HGB 8.3* 8.5* 7.8*  HCT 32.2* 32.6* 29.7*  WBC 10.6* 12.5* 7.4  PLT 311 317 275   Dg Chest 1 View  Result Date: 02/03/2017 CLINICAL DATA:  Status post left thoracentesis EXAM: CHEST 1 VIEW COMPARISON:  02/02/2017 FINDINGS: Right lung is again well aerated. Cardiac shadow is stable. Postsurgical changes are again noted. Interval thoracentesis has been performed. A small residual pleural effusion is noted inferiorly. The large central mass lesion is noted which corresponds to that seen on prior CT examination. No pneumothorax is noted. IMPRESSION: No evidence of pneumothorax following thoracentesis. Small residual pleural effusion is noted. Large central mass lesion on the left. Electronically Signed   By: Inez Catalina [name]   On: 02/03/2017 15:25   Ct Head Wo Contrast  Result Date: 02/02/2017 CLINICAL DATA:  .61 year old female with altered mental status. EXAM: CT HEAD WITHOUT CONTRAST TECHNIQUE: Contiguous axial images were obtained from the base of the skull through the vertex without intravenous contrast. COMPARISON:  Head CT dated 09/16/2016 FINDINGS: Evaluation of this exam is limited due to motion artifact. Brain: The ventricles and sulci appropriate size for patient's age. The gray-white matter discrimination is preserved. There is no acute intracranial hemorrhage. No mass effect or midline shift. No extra-axial fluid collection. Vascular: No hyperdense vessel or unexpected calcification. Skull: Normal. Negative for fracture or focal lesion. Sinuses/Orbits: No acute finding. Other: None IMPRESSION: No acute intracranial pathology on a motion degraded study. Electronically Signed   By: Anner Crete [name]   On:  02/02/2017 23:32   Ct Angio Chest Pe W Or Wo Contrast  Result Date: 02/02/2017 CLINICAL DATA:  Altered mental status hypoxia EXAM: CT ANGIOGRAPHY CHEST WITH CONTRAST TECHNIQUE: Multidetector CT imaging of the chest was performed using the standard protocol during bolus administration of intravenous contrast. Multiplanar CT image reconstructions and MIPs were obtained to evaluate the vascular anatomy. CONTRAST:  147mL ISOVUE-370 IOPAMIDOL (ISOVUE-370) INJECTION 76% COMPARISON:  02/02/2017, 09/17/2006 FINDINGS: Cardiovascular: Satisfactory opacification of the pulmonary arteries to the segmental level. No evidence of pulmonary embolism. Left-sided pulmonary vessels are attenuated by diffuse left lung consolidation but no filling defects are seen. Aortic atherosclerosis. Coronary artery calcification. Normal heart size. No large pericardial effusion. Mediastinum/Nodes: No thyroid mass. Midline trachea. Similar appearance of right precarinal lymph nodes. Increased AP window adenopathy, with lymph nodes measuring up to 13 mm. Esophagus unremarkable Lungs/Pleura: Large left pleural effusion. Diffuse consolidation throughout the left lung. Central low density round area in the central left upper lobe is suspected to represent the patient's known lung mass, this region measures approximately 7 cm but it is difficult to distinguish from adjacent consolidated lung parenchyma. Moderate emphysema in the right lung Upper Abdomen: Ill-defined nodularity of the left adrenal gland measuring about 11 mm. Small stones in the gallbladder. Musculoskeletal: Sternotomy changes. No acute or suspicious bone lesion. Review of the MIP images confirms the above findings. IMPRESSION: 1. No definite  acute pulmonary embolus is visualized. Diffuse attenuation and narrowing of left-sided pulmonary vessels due to mass and consolidative process but no filling defects are seen. 2. Large left pleural effusion. Diffuse consolidation of the left  lung parenchyma. Hypodense masslike area in the central left upper lobe is suspected to represent the patient's lung mass, it is difficult to distinguish from adjacent atelectatic lung or pneumonia, suspect that the mass is increased in size. Increased AP window adenopathy. 3. Ill-defined left adrenal gland nodule measuring 11 mm 4. Small gallstones 5. Emphysema Aortic Atherosclerosis (ICD10-I70.0) and Emphysema (ICD10-J43.9). Electronically Signed   By: Donavan Foil [name]   On: 02/02/2017 23:40   Dg Chest Port 1 View  Result Date: 02/04/2017 CLINICAL DATA:  Respiratory failure EXAM: PORTABLE CHEST 1 VIEW COMPARISON:  02/03/2017 FINDINGS: Left pleural effusion improved. Central left upper lung zone opacity is smaller and improved. Right lung remains clear. Upper normal heart size. Postoperative changes. No pneumothorax. IMPRESSION: Improved central left upper lobe opacity. Improved left pleural effusion. No pneumothorax. Electronically Signed   By: Marybelle Killings [name]   On: 02/04/2017 08:58   Dg Chest Portable 1 View  Result Date: 02/02/2017 CLINICAL DATA:  Shortness of breath EXAM: PORTABLE CHEST 1 VIEW COMPARISON:  09/17/2016, CT 09/16/2016 FINDINGS: Diffuse opacity of the left thorax. Cardiomediastinal silhouette obscured. Vascular congestion on the right. Minimal atelectasis at the right base. Post sternotomy changes. Aortic atherosclerosis. IMPRESSION: 1. Diffuse opacification of left thorax may relate to combination of large pleural effusion and underlying atelectasis, pneumonia, or mass 2. Minimal atelectasis at the right base Electronically Signed   By: Donavan Foil [name]   On: 02/02/2017 17:51   US Thoracentesis Asp Pleural Space W/img Guide  Result Date: 02/03/2017 INDICATION: 61 year old female with a history of recurrent left-sided pleural effusion. EXAM: ULTRASOUND GUIDED LEFT THORACENTESIS MEDICATIONS: None. COMPLICATIONS: None immediate. PROCEDURE: An ultrasound guided thoracentesis was  thoroughly discussed with the patient and questions answered. The benefits, risks, alternatives and complications were also discussed. The patient understands and wishes to proceed with the procedure. Written consent was obtained. Ultrasound was performed to localize and mark an adequate pocket of fluid in the left chest. The area was then prepped and draped in the normal sterile fashion. 1% Lidocaine was used for local anesthesia. Under ultrasound guidance a 8 Fr Safe-T-Centesis catheter was introduced. Thoracentesis was performed. The catheter was removed and a dressing applied. FINDINGS: A total of approximately 2200 cc of serosanguineous fluid was removed. Samples were sent to the laboratory as requested by the clinical team. IMPRESSION: Status post ultrasound-guided left thoracentesis. Signed, Dulcy Fanny. Earleen Newport, DO [facility] Electronically Signed   By: Corrie Mckusick D.O.   On: 02/03/2017 16:19    ASSESSMENT / PLAN: A: Acute on Chronic Respiratory Failure 2/2 large left pleural Effusion, known Left  lung mass which has increased in size since last  imaging 09/2016. Initially discovered 11/2015. Pt did not want work up>> highly suspicious for  primary bronchogenic carcinoma with nodal metastasis.  Previous thoracentesis ( 09/2016) was positive for reactive mesothelial cells, but not malignancy. S/P Thoracentesis 02/03/2017 with  1280 cc serosanguinous fluid>> sent for evaluation COPD exacerbation>> home oxygen at 5 L>> non-compliant Hypercapnia >> 12/29 CO2 per gas 101>> contributing to AMS Suspected pneumonia AMS/ Acute Encephalopathy vs Hypercapnea DNR already established Tolerated BiPAP overnight CXR 12/30 with improvement>>Central left upper lung zone opacity is smaller and improved. Right lung remains clear.    Plan: Titrate oxygen  for saturations of 88-92% Follow up CXR 12/31 CXR prn Follow pleural fluid for cytology Not  candidate for EBUS / Bronch for biopsy  due to ES COPD Continue Empiric antibiotics Continue IV steroids Continue scheduled BD Continue prn BD Palliative Care consult ABG prn Bi Pap  QHS and prn  Magdalen Spatz, AGACNP-BC Pulmonary and [place] Pager: [phone]  02/04/2017, 1:17 PM

## 2017-02-04 NOTE — Clinical Social Work Note (Signed)
CSW acknowledges SNF consult. Please enter PT/OT orders for evaluations to determine appropriateness.  Dayton Scrape, [place]

## 2017-02-04 NOTE — Consult Note (Signed)
Consultation Note Date: 02/04/2017   Patient Name: [name]  DOB: [birth year]  MRN: [id number]  Age / Sex: 61 y.o., female  PCP: Brand Males, MD Referring Physician: Samuella Cota, MD  Reason for Consultation: Establishing goals of care and Psychosocial/spiritual support  HPI/Patient Profile: 61 y.o. female  with past medical history of COPD on oxygen at home, coronary artery disease, history of MI, history of CVA, diabetes type 2, atrial fib, congestive heart failure, lung mass (found in October 2017; no outpatient workup) admitted on 02/02/2017 with altered mental status, dyspnea x 2 weeks.  Her CO2 on admission was 102.  She was found to have a large left pleural effusion and underwent thoracentesis on 02/03/2017 for 1280 cc.  Consult ordered for goals of care.   Clinical Assessment and Goals of Care: Met with patient, chart reviewed.  No family at the bedside.  Palliative medicine providers have seen [name] in the past.  Most recently, August 2018 for hypercarbic respiratory failure.  [name] is struggled with outpatient follow-up.  She is indicated that she will follow-up with an oncologist in the past but has yet to do so for left upper lobe lung mass which is appears as though it is adenocarcinoma.  She is now admitted with a large left pleural effusion with questionable opacity and has undergone thoracentesis.  She is more alert today.  She is able to answer a few simple questions and her answers appear appropriate.  She tells me "you know I have cancer?".  I shared with her that, yes, when we met before in August we had discussed that then and she states "I am going to go see somebody about it next week".  I did ask her what ihas changed for her in terms of follow-up and she states "I do not want to die".  Although patient can answer a few simple questions I do not feel that she is  back to her baseline.  Her husband is not at the bedside and would be her healthcare proxy in the event she were unable to speak for herself.  In the past when we have met Mr. Moomaw and discussed resources to help support patient in the home such as hospice, he has been opposed to this.  Mr. Mestas feels that hospice pace and his mother's death    SUMMARY OF RECOMMENDATIONS   Continue DNR/DNI Palliative medicine to schedule appointment with Mr. Ficek and other relevant family members to discuss goals of care in the setting of end-stage COPD and likely lung cancer Code Status/Advance Care Planning:  DNR    Symptom Management:   Dyspnea: Continue targeted pulmonary treatments, steroids as well as as needed morphine for severe dyspnea  Pain: Continue gabapentin, as needed morphine  Palliative Prophylaxis:   Aspiration, Bowel Regimen, Delirium Protocol, Eye Care, Frequent Pain Assessment, Oral Care and Turn Reposition  Additional Recommendations (Limitations, Scope, Preferences):  Full Scope Treatment except for DNR/DNI  Psycho-social/Spiritual:   Desire for further Chaplaincy support:no  Additional Recommendations:  Caregiving  Support/Resources  Prognosis:   Unable to determine  Discharge Planning: To Be Determined      Primary Diagnoses: Present on Admission: . (Resolved) Acute respiratory failure with hypoxia ([place]) . COPD exacerbation ([place]) . Chronic diastolic heart failure ([place]) . Acute metabolic encephalopathy . Acute on chronic respiratory failure with hypoxia and hypercapnia ([facility]) . Chronic diastolic CHF (congestive heart failure) ([place]) . Chronic respiratory failure ([place]) . Diabetes mellitus with complication ([place]) . Mass of left lung   I have reviewed the medical record, interviewed the patient and family, and examined the patient. The following aspects are pertinent.  Past Medical History:  Diagnosis Date  . Acute and chronic respiratory failure with  hypoxia ([place])   . Acute MI ([place])   . Arthritis   . CHF (congestive heart failure) ([place])   . COPD (chronic obstructive pulmonary disease) ([place])    home O2  . Coronary artery disease   . Diabetes mellitus without complication ([place])   . Mass of left lung   . Shortness of breath   . Stroke [facility]) TIA    Social History   Socioeconomic History  . Marital status: Single    Spouse name: None  . Number of children: None  . Years of education: None  . Highest education level: None  Social Needs  . Financial resource strain: None  . Food insecurity - worry: None  . Food insecurity - inability: None  . Transportation needs - medical: None  . Transportation needs - non-medical: None  Occupational History  . None  Tobacco Use  . Smoking status: Former Smoker    Packs/day: 3.00    Years: 40.00    Pack years: 120.00    Types: Cigarettes    Last attempt to quit: 02/06/2009    Years since quitting: 8.0  . Smokeless tobacco: Never Used  Substance and Sexual Activity  . Alcohol use: No  . Drug use: None  . Sexual activity: Not Currently  Other Topics Concern  . None  Social History Narrative  . None   Family History  Problem Relation Age of Onset  . Lung cancer Brother   . Emphysema Brother   . COPD Sister   . Heart attack Sister   . Diabetes Brother   . Lung cancer Brother   . Emphysema Sister    Scheduled Meds: . atorvastatin  20 mg Oral Daily  . budesonide (PULMICORT) nebulizer solution  0.25 mg Nebulization BID  . gabapentin  100 mg Oral TID  . insulin aspart  0-9 Units Subcutaneous TID WC  . insulin glargine  5 Units Subcutaneous Daily  . ipratropium-albuterol  3 mL Nebulization Q6H  . isosorbide mononitrate  60 mg Oral Daily  . methylPREDNISolone (SOLU-MEDROL) injection  40 mg Intravenous Q6H  . metoprolol tartrate  12.5 mg Oral BID  . rivaroxaban  20 mg Oral Q supper   Continuous Infusions: . piperacillin-tazobactam (ZOSYN)  IV 3.375 g (02/04/17 1444)  .  vancomycin Stopped (02/04/17 1000)   PRN Meds:.acetaminophen **OR** acetaminophen, albuterol, LORazepam, morphine injection, ondansetron **OR** ondansetron (ZOFRAN) IV Medications Prior to Admission:  Prior to Admission medications   Medication Sig Start Date End Date Taking? Authorizing Provider  albuterol (PROVENTIL) (2.5 MG/3ML) 0.083% nebulizer solution Take 3 mLs (2.5 mg total) by nebulization every 2 (two) hours as needed for wheezing. 09/19/16  Yes Debbe Odea, MD  atorvastatin (LIPITOR) 20 MG tablet Take 1 tablet (20 mg total) by mouth daily. 02/25/13  Yes Joya Gaskins,  Burnett Harry, MD  furosemide (LASIX) 20 MG tablet Take 1 tablet (20 mg total) by mouth daily as needed. 09/19/16 09/19/17 Yes Debbe Odea, MD  gabapentin (NEURONTIN) 100 MG capsule Take 100 mg by mouth 3 (three) times daily. 03/18/15 06/14/17 Yes [provider]  insulin aspart (NOVOLOG FLEXPEN) 100 UNIT/ML SOPN FlexPen Inject 6 Units into the skin 3 (three) times daily with meals. Patient taking differently: Inject 20 Units into the skin 2 (two) times daily.  12/18/12  Yes Gherghe, Vella Redhead, MD  Insulin Degludec (TRESIBA FLEXTOUCH) 100 UNIT/ML SOPN Inject 20 Units into the skin daily.   Yes [provider]  Ipratropium-Albuterol (COMBIVENT RESPIMAT) 20-100 MCG/ACT AERS respimat Inhale 1-2 puffs into the lungs 4 (four) times daily. 09/19/16  Yes Rizwan, Eunice Blase, MD  ipratropium-albuterol (DUONEB) 0.5-2.5 (3) MG/3ML SOLN Take 3 mLs by nebulization every 4 (four) hours as needed. Patient taking differently: Take 3 mLs by nebulization every 4 (four) hours as needed (for shortness of breath).  09/19/16  Yes Debbe Odea, MD  isosorbide mononitrate (IMDUR) 60 MG 24 hr tablet Take 60 mg by mouth daily.   Yes [provider]  metoprolol tartrate (LOPRESSOR) 25 MG tablet Take 0.5 tablets (12.5 mg total) by mouth 2 (two) times daily. Patient taking differently: Take 12.5 mg by mouth daily.  09/19/16  Yes Debbe Odea, MD    Potassium Chloride ER 20 MEQ TBCR Take 20 mEq by mouth daily as needed. Patient taking differently: Take 20 mEq by mouth daily as needed (with furosemide).  09/19/16  Yes Debbe Odea, MD  Rivaroxaban (XARELTO) 20 MG TABS tablet Take 1 tablet (20 mg total) by mouth daily. 02/25/13  Yes Elsie Stain, MD  HYDROcodone-acetaminophen (NORCO/VICODIN) 5-325 MG tablet Take 1-2 tablets by mouth every 6 (six) hours as needed for moderate pain. Patient not taking: Reported on 02/02/2017 06/23/16   Mariel Aloe, MD  nystatin (MYCOSTATIN/NYSTOP) powder Apply topically 3 (three) times daily. Patient not taking: Reported on 02/02/2017 09/19/16   Debbe Odea, MD  pantoprazole (PROTONIX) 40 MG tablet Take 1 tablet (40 mg total) by mouth daily. Patient not taking: Reported on 02/02/2017 09/19/16   Debbe Odea, MD  predniSONE (DELTASONE) 10 MG tablet 40 mg x 2 days 30 mg x 4 days 20 mg x 4 days 10 mg x 4 days Patient not taking: Reported on 02/02/2017 09/19/16   Debbe Odea, MD   Allergies  Allergen Reactions  . Ciprofloxacin Rash    REACTION: hives/rash   Review of Systems  Unable to perform ROS: Acuity of condition    Physical Exam  Constitutional:  Acutely ill-appearing older female.  No acute distress.  Will respond to simple questions before becoming  somnolent  HENT:  Head: Normocephalic and atraumatic.  Neck: Normal range of motion.  Pulmonary/Chest: She has rales.  No increased work of breathing Wearing her nasal cannula in her mouth which she states she prefers to do  Neurological:  Somnolent but awakens to voice Able to answer a few simple questions only  Psychiatric:  Unable to test No psychomotor restlessness or overt agitation observed  Nursing note and vitals reviewed.   Vital Signs: BP 107/61   Pulse (!) 102   Temp 98 F (36.7 C) (Axillary)   Resp 20   Ht _0  (1.702 m)   Wt 70 kg (154 lb 5.2 oz)   SpO2 95%   BMI 24.17 kg/m  Pain Assessment: No/denies  pain  SpO2: SpO2: 95 % O2 Device:SpO2: 95 % O2 Flow Rate: .O2 Flow Rate (L/min): 5 L/min  IO: Intake/output summary:   Intake/Output Summary (Last 24 hours) at 02/04/2017 1544 Last data filed at 02/04/2017 0800 Gross per 24 hour  Intake 300 ml  Output 200 ml  Net 100 ml    LBM: Last BM Date: 02/03/17 Baseline Weight: Weight: 72.5 kg (159 lb 13.3 oz) Most recent weight: Weight: 70 kg (154 lb 5.2 oz)     Palliative Assessment/Data:   Flowsheet Rows     Most Recent Value  Intake Tab  Referral Department  Hospitalist  Unit at Time of Referral  Intermediate Care Unit  Palliative Care Primary Diagnosis  Pulmonary  Date Notified  02/03/17  Palliative Care Type  Return patient Palliative Care  Reason for referral  Clarify Goals of Care  Date of Admission  02/02/17  Date first seen by Palliative Care  02/04/17  # of days Palliative referral response time  1 Day(s)  # of days IP prior to Palliative referral  1  Clinical Assessment  Palliative Performance Scale Score  40%  Pain Max last 24 hours  Not able to report  Pain Min Last 24 hours  Not able to report  Dyspnea Max Last 24 Hours  Not able to report  Dyspnea Min Last 24 hours  Not able to report  Nausea Max Last 24 Hours  Not able to report  Nausea Min Last 24 Hours  Not able to report  Anxiety Max Last 24 Hours  Not able to report  Anxiety Min Last 24 Hours  Not able to report  Other Max Last 24 Hours  Not able to report  Psychosocial & Spiritual Assessment  Palliative Care Outcomes  Patient/Family meeting held?  Yes  Who was at the meeting?  pt  Palliative Care follow-up planned  Yes, Facility      Time In: 1500 Time Out: 1555 Time Total: 55 min Greater than 50%  of this time was spent counseling and coordinating care related to the above assessment and plan.  Signed by: Dory Horn, NP   Please contact Palliative Medicine Team phone at [phone] for questions and concerns.  For individual  provider: See Shea Evans

## 2017-02-05 ENCOUNTER — Inpatient Hospital Stay (HOSPITAL_COMMUNITY): Payer: BLUE CROSS/BLUE SHIELD

## 2017-02-05 DIAGNOSIS — J9611 Chronic respiratory failure with hypoxia: Secondary | ICD-10-CM

## 2017-02-05 DIAGNOSIS — J9612 Chronic respiratory failure with hypercapnia: Secondary | ICD-10-CM

## 2017-02-05 LAB — CBC
HCT: 28.4 % — ABNORMAL LOW (ref 36.0–46.0)
Hemoglobin: 7.6 g/dL — ABNORMAL LOW (ref 12.0–15.0)
MCH: 24.9 pg — ABNORMAL LOW (ref 26.0–34.0)
MCHC: 26.8 g/dL — ABNORMAL LOW (ref 30.0–36.0)
MCV: 93.1 fL (ref 78.0–100.0)
PLATELETS: 281 10*3/uL (ref 150–400)
RBC: 3.05 MIL/uL — AB (ref 3.87–5.11)
RDW: 14.7 % (ref 11.5–15.5)
WBC: 12.2 10*3/uL — AB (ref 4.0–10.5)

## 2017-02-05 LAB — BASIC METABOLIC PANEL
ANION GAP: 11 (ref 5–15)
BUN: 12 mg/dL (ref 6–20)
CALCIUM: 9.2 mg/dL (ref 8.9–10.3)
CO2: 49 mmol/L — ABNORMAL HIGH (ref 22–32)
Chloride: 74 mmol/L — ABNORMAL LOW (ref 101–111)
Creatinine, Ser: 0.76 mg/dL (ref 0.44–1.00)
GLUCOSE: 259 mg/dL — AB (ref 65–99)
POTASSIUM: 3.8 mmol/L (ref 3.5–5.1)
SODIUM: 134 mmol/L — AB (ref 135–145)

## 2017-02-05 LAB — GLUCOSE, CAPILLARY
GLUCOSE-CAPILLARY: 222 mg/dL — AB (ref 65–99)
GLUCOSE-CAPILLARY: 375 mg/dL — AB (ref 65–99)
Glucose-Capillary: 333 mg/dL — ABNORMAL HIGH (ref 65–99)
Glucose-Capillary: 254 mg/dL — ABNORMAL HIGH (ref 65–99)

## 2017-02-05 LAB — PH, BODY FLUID: pH, Body Fluid: 7.6

## 2017-02-05 MED ORDER — METHYLPREDNISOLONE SODIUM SUCC 40 MG IJ SOLR
40.0000 mg | Freq: Two times a day (BID) | INTRAMUSCULAR | Status: DC
  Administered 2017-02-05 – 2017-02-10 (×10): 40 mg via INTRAVENOUS
  Filled 2017-02-05 (×10): qty 1

## 2017-02-05 MED ORDER — INSULIN ASPART 100 UNIT/ML ~~LOC~~ SOLN
3.0000 [IU] | Freq: Three times a day (TID) | SUBCUTANEOUS | Status: DC
  Administered 2017-02-05 – 2017-02-09 (×12): 3 [IU] via SUBCUTANEOUS

## 2017-02-05 MED ORDER — CEFUROXIME AXETIL 500 MG PO TABS
500.0000 mg | ORAL_TABLET | Freq: Two times a day (BID) | ORAL | Status: DC
  Administered 2017-02-05 – 2017-02-07 (×6): 500 mg via ORAL
  Filled 2017-02-05 (×6): qty 1

## 2017-02-05 MED ORDER — AMOXICILLIN-POT CLAVULANATE 875-125 MG PO TABS: 1.0000 | ORAL_TABLET | Freq: Two times a day (BID) | ORAL | Status: DC

## 2017-02-05 MED ORDER — INSULIN GLARGINE 100 UNIT/ML ~~LOC~~ SOLN
20.0000 [IU] | Freq: Every day | SUBCUTANEOUS | Status: DC
  Administered 2017-02-06: 20 [IU] via SUBCUTANEOUS
  Filled 2017-02-05 (×2): qty 0.2

## 2017-02-05 MED ORDER — INSULIN GLARGINE 100 UNIT/ML ~~LOC~~ SOLN
15.0000 [IU] | Freq: Once | SUBCUTANEOUS | Status: AC
  Administered 2017-02-05: 15 [IU] via SUBCUTANEOUS
  Filled 2017-02-05: qty 0.15

## 2017-02-05 NOTE — Progress Notes (Addendum)
Inpatient Diabetes Program Recommendations  AACE/ADA: New Consensus Statement on Inpatient Glycemic Control (2015)  Target Ranges:  Prepandial:   less than 140 mg/dL      Peak postprandial:   less than 180 mg/dL (1-2 hours)      Critically ill patients:  140 - 180 mg/dL   Results for FELIX, PRATT (MRN [id number]) as of 02/05/2017 14:27  Ref. Range 02/04/2017 06:19 02/04/2017 11:03 02/04/2017 16:10 02/04/2017 20:59 02/05/2017 06:11 02/05/2017 11:19  Glucose-Capillary Latest Ref Range: 65 - 99 mg/dL 184 (H) 229 (H) 298 (H) 293 (H) 222 (H) 375 (H)   Review of Glycemic Control  Outpatient Diabetes medications: Tresiba 20 units daily, Novolog 20 units BID Current orders for Inpatient glycemic control: Lantus 20 units daily (one time Lantus 15 units x 1 today-for total of 20 units), Novolog 0-9 units TID with meals, Novolog 3 units TID with meals for meal coverage; Solumedrol 40 mg Q12H  Inpatient Diabetes Program Recommendations: Insulin - Basal: Note Lantus increased to 20 units daily. Correction (SSI): Please consider ordering Novolog 0-5 units QHS for bedtime correction scale. Diet: Please consider discontinuing Regular diet and ordering Carb Modified.  Thanks, Barnie Alderman, RN, MSN, CDE Diabetes Coordinator Inpatient Diabetes Program [phone] (Team Pager from 8am to 5pm)

## 2017-02-05 NOTE — Plan of Care (Signed)
  Progressing Risk for Falls Knowledge of Patient Safety will improve 02/05/2017 0003 - Progressing by Blair Promise, RN Education: Knowledge of General Education information will improve 02/05/2017 0003 - Progressing by Blair Promise, RN Health Behavior/Discharge Planning: Ability to manage health-related needs will improve 02/05/2017 0003 - Progressing by Blair Promise, RN Clinical Measurements: Ability to maintain clinical measurements within normal limits will improve 02/05/2017 0003 - Progressing by Blair Promise, RN Will remain free from infection 02/05/2017 0003 - Progressing by Blair Promise, RN Diagnostic test results will improve 02/05/2017 0003 - Progressing by Blair Promise, RN Respiratory complications will improve 02/05/2017 0003 - Progressing by Blair Promise, RN Cardiovascular complication will be avoided 02/05/2017 0003 - Progressing by Blair Promise, RN

## 2017-02-05 NOTE — Progress Notes (Signed)
PULMONARY / CRITICAL CARE MEDICINE   Name: [name] MRN: [id number] DOB: [birth year]    ADMISSION DATE:  02/02/2017  HISTORY OF PRESENT ILLNESS:        [name] a 61 y.o.female former smoker with a 120 pack year smoking history ( Quit 2011)   withhistory of COPD, diabetes mellitus type 2, embolic CVA, CHF and known history of lung mass ( Discovered 11/2015 during hospital admission)  was brought to the ER the patient was found to be increasingly confused by patient's husband. As per the husband patient has not been ambulatory as usual which is progressively worse last 2 weeks. Patient also has been increasingly short of breath. Denied chest pain productive cough fever chills or any loss of consciousness nausea vomiting or diarrhea upon admission. CTA chest was done which was negative for PE but was positive for Diffuse attenuation and narrowing of left-sided pulmonary vessels due to known pulmonary  mass and consolidative process, large left pleural effusion,possible pneumonia and increased AP adenopathy and left adrenal gland measuring 11 mm. Pt's dyspnea did not improve, and she underwent a therapeutic/diagnostic thoracentesis 12/29 with removal of 1280 cc's of thin serosanguinous fluid . Fluid was sent for evaluation. Previous Thora done 09/2016 did not reveal malignant cells. CCM were asked to consult for lung mass and pleural effusion, COPD exacerbation and  Suspected pneumonia. ABG 12/29  revealed CO2 of 101. Bi Pap was initiated with improvement in mental status.  Per husband, patient is non-compliant with her pulmonary nebulizer treatments. She uses her Pro Air and Combivent inhalers " Way too much" Additionally she does not maintain good follow up regarding medical appointments. Husband states she cancels appointments frequently.      PAST MEDICAL HISTORY :  She  has a past medical history of Acute and chronic respiratory failure with hypoxia ([place]), Acute MI ([place]), Arthritis,  CHF (congestive heart failure) ([place]), COPD (chronic obstructive pulmonary disease) ([place]), Coronary artery disease, Diabetes mellitus without complication ([place]), Mass of left lung, Shortness of breath, and Stroke ([place]) (TIA ).  PAST SURGICAL HISTORY: She  has a past surgical history that includes Abdominal hysterectomy; Cardiac catheterization; and Coronary artery bypass graft.  Allergies  Allergen Reactions  . Ciprofloxacin Rash    REACTION: hives/rash    No current facility-administered medications on file prior to encounter.    Current Outpatient Medications on File Prior to Encounter  Medication Sig  . albuterol (PROVENTIL) (2.5 MG/3ML) 0.083% nebulizer solution Take 3 mLs (2.5 mg total) by nebulization every 2 (two) hours as needed for wheezing.  Marland Kitchen atorvastatin (LIPITOR) 20 MG tablet Take 1 tablet (20 mg total) by mouth daily.  . furosemide (LASIX) 20 MG tablet Take 1 tablet (20 mg total) by mouth daily as needed.  . gabapentin (NEURONTIN) 100 MG capsule Take 100 mg by mouth 3 (three) times daily.  . insulin aspart (NOVOLOG FLEXPEN) 100 UNIT/ML SOPN FlexPen Inject 6 Units into the skin 3 (three) times daily with meals. (Patient taking differently: Inject 20 Units into the skin 2 (two) times daily. )  . Insulin Degludec (TRESIBA FLEXTOUCH) 100 UNIT/ML SOPN Inject 20 Units into the skin daily.  . Ipratropium-Albuterol (COMBIVENT RESPIMAT) 20-100 MCG/ACT AERS respimat Inhale 1-2 puffs into the lungs 4 (four) times daily.  Marland Kitchen ipratropium-albuterol (DUONEB) 0.5-2.5 (3) MG/3ML SOLN Take 3 mLs by nebulization every 4 (four) hours as needed. (Patient taking differently: Take 3 mLs by nebulization every 4 (four) hours as needed (for shortness of breath). )  .  isosorbide mononitrate (IMDUR) 60 MG 24 hr tablet Take 60 mg by mouth daily.  . metoprolol tartrate (LOPRESSOR) 25 MG tablet Take 0.5 tablets (12.5 mg total) by mouth 2 (two) times daily. (Patient taking differently: Take 12.5 mg by mouth  daily. )  . Potassium Chloride ER 20 MEQ TBCR Take 20 mEq by mouth daily as needed. (Patient taking differently: Take 20 mEq by mouth daily as needed (with furosemide). )  . Rivaroxaban (XARELTO) 20 MG TABS tablet Take 1 tablet (20 mg total) by mouth daily.  Marland Kitchen HYDROcodone-acetaminophen (NORCO/VICODIN) 5-325 MG tablet Take 1-2 tablets by mouth every 6 (six) hours as needed for moderate pain. (Patient not taking: Reported on 02/02/2017)  . nystatin (MYCOSTATIN/NYSTOP) powder Apply topically 3 (three) times daily. (Patient not taking: Reported on 02/02/2017)  . pantoprazole (PROTONIX) 40 MG tablet Take 1 tablet (40 mg total) by mouth daily. (Patient not taking: Reported on 02/02/2017)  . predniSONE (DELTASONE) 10 MG tablet 40 mg x 2 days 30 mg x 4 days 20 mg x 4 days 10 mg x 4 days (Patient not taking: Reported on 02/02/2017)    FAMILY HISTORY:  Her    SOCIAL HISTORY: She  reports that she quit smoking about 8 years ago. Her smoking use included cigarettes. She has a 120.00 pack-year smoking history. she has never used smokeless tobacco. She reports that she does not drink alcohol.  REVIEW OF SYSTEMS:   Not obtainable  SUBJECTIVE:  She is somewhat lethargic and does not give extensive appropriate answers.  She reports that her dyspnea is substantially improved.  VITAL SIGNS: BP (!) 116/59 (BP Location: Left Arm)   Pulse 82   Temp (!) 97.5 F (36.4 C) (Oral)   Resp 17   Ht 5\' 7"  (1.702 m)   Wt 155 lb 10.3 oz (70.6 kg)   SpO2 92%   BMI 24.38 kg/m     INTAKE / OUTPUT: I/O last 3 completed shifts: In: 300 [IV Piggyback:300] Out: 600 [Urine:600]  PHYSICAL EXAMINATION: General: This is an obese female with a nasal cannula in place who is in no overt distress. Neuro: She continues to be somewhat lethargic and cannot tell me the day or date. Cardiovascular: S1 and S2 are currently regular without murmur rub or gallop. Lungs: Aspirations are unlabored, there is symmetric air  movement anteriorly, few scattered rhonchi and no wheezes. Abdomen: Abdomen is grossly obese and soft without any organomegaly masses tenderness guarding or rebound  LABS:  BMET Recent Labs  Lab 02/03/17 0540 02/04/17 0251 02/05/17 0254  NA 137 135 134*  K 4.0 4.1 3.8  CL 75* 76* 74*  CO2 50* 44* 49*  BUN 5* 10 12  CREATININE 0.58 0.80 0.76  GLUCOSE 128* 201* 259*    Electrolytes Recent Labs  Lab 02/03/17 0540 02/04/17 0251 02/05/17 0254  CALCIUM 9.5 9.1 9.2    CBC Recent Labs  Lab 02/03/17 0540 02/04/17 0251 02/05/17 0254  WBC 12.5* 7.4 12.2*  HGB 8.5* 7.8* 7.6*  HCT 32.6* 29.7* 28.4*  PLT 317 275 281    Coag's No results for input(s): APTT, INR in the last 168 hours.  Sepsis Markers Recent Labs  Lab 02/02/17 1737  LATICACIDVEN 1.14    ABG Recent Labs  Lab 02/03/17 1810  PHART 7.339*  PCO2ART 101*  PO2ART 110*    Liver Enzymes Recent Labs  Lab 02/02/17 1724  AST 12*  ALT 9*  ALKPHOS 78  BILITOT 0.8  ALBUMIN 2.1*    Cardiac  Enzymes No results for input(s): TROPONINI, PROBNP in the last 168 hours.  Glucose Recent Labs  Lab 02/04/17 0619 02/04/17 1103 02/04/17 1610 02/04/17 2059 02/05/17 0611 02/05/17 1119  GLUCAP 184* 229* 298* 293* 222* 375*    Imaging Dg Chest Port 1 View  Result Date: 02/05/2017 CLINICAL DATA:  Shortness of breath. EXAM: PORTABLE CHEST 1 VIEW COMPARISON:  02/04/2017 FINDINGS: Sequelae of prior CABG are again identified. The cardiac silhouette is mildly enlarged. Aortic atherosclerosis is noted. A left upper lobe lung mass is again noted. Patchy airspace and interstitial opacity in the left mid and lower lung has slightly increased. There is denser retrocardiac opacity in the left lung base which is similar to the prior study, and there is a persistent small left pleural effusion. The right lung remains clear. No pneumothorax is identified. IMPRESSION: 1. Mildly worsening aeration in the left mid and lower  lung. 2. Persistent left pleural effusion. 3. Left upper lobe mass. Electronically Signed   By: Logan Bores [name]   On: 02/05/2017 07:32     STUDIES:  Left pleural fluid cytology has not yet been reported.    ANTIBIOTICS: Ceftin   DISCUSSION: This is a 61 year old who at baseline has significant COPD.  She presented with altered mental status and hypercarbia.  She was found to have a large left pleural effusion which has been drained.  In addition she has a known left-sided mass, which is not been definitively diagnosed.  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  We are awaiting cytology.  Patient has significant baseline COPD so that even should she prove to stage appropriately she is likely not resectable. She is reporting significant improvement in her dyspnea following her thoracentesis, I am anticipating no change in her antibiotics of bronchodilators at this time.   NEUROLOGIC A: Status continues to be extremely poor despite addressing her hypercarbic respiratory failure.  Taken the liberty to order a ionized calcium in the morning and would contemplate a CT scan for metastatic disease. Findings may facilitate family decision making.   Lars Masson, MD Pulmonary and [place] Pager: [phone]  02/05/2017, 12:22 PM

## 2017-02-05 NOTE — Progress Notes (Signed)
RT went in patient room to place BIPAP on patient. Patient states she does not want to wear BIPAP at this time. Patients sats 98% and all vitals are stable. Will continue to monitor.

## 2017-02-05 NOTE — Progress Notes (Signed)
PROGRESS NOTE  [name] TFT:[id number] DOB: 1955/04/08 DOA: 02/02/2017 PCP: Brand Males, MD  Brief Narrative: 14yow PMH COPD, lung mass presented with increasing confusion and SOB.  Assessment/Plan Acute on chronic hypoxic, hypercapnic resp failure secondary to large left pleural effusion ("white out"), known left lung mass, suspected pneumonia, COPD exacerbation. S/p large volume thoracentesis 12/29 - much better; tolerating BiPAP at night and [place] during day - doubt MRSA or resistant organisms >> change to Ceftin, d/c Vanc -continue steroids (but decrease freq), bronchodilators  Acute encephalopathy secondary to above - improving; continue treatments as above 12/31  Left lung mass. Noted on office visit 04/2016, hospitalization 06/2016 and 09/2016 but patient put off follow-up.  -  f/u cytology, appreciate PMT involvement. Pt continues to improve but doubt can contribute much to [place] today   E coli UTI - treat with Ceftin  Normocytic anemia/anemia of chronic disease - Hgb stable; no bleeding. Monitor intermittently; no indication for transfusion  Anxiety  - appears stable  COPD, chronic hypoxic resp failure on home oxygen 5 L - stabilizing, continue tx as above  DM type 2 on insulin - CBG high, continue SSI, increase Lantus to 20 units daily, add meal coverage 3 units  Chronic diastolic CHF - appears compensated 12/31, monitor I/O - Lasix daily as needed  PMH CVA on Xarelto - continue Xarelto    Aortic atherosclerosis - continue atorvastatin 12/31  Body mass index is 24.38 kg/m.   Continues to improve but prognosis is poor; f/u cytology, appreciate pulm and PMT involvement; husband may need to be decision maker in patient unable to participate  DVT prophylaxis: SCDs Code Status: DNR no intubation  Family Communication: husband at bedside Disposition Plan: pending    Murray Hodgkins, MD  [facility] Direct contact: [phone] --[place] [place]  --[email or web address]; password [email or web address]  7PM-7AM contact night coverage as above 02/05/2017, 11:26 AM  LOS: 3 days   Consultants:  Pulmonology   Procedures:  12/29 US guided left thoracentesis, ~1280cc of thin serosanguinous fluid aspirated  Antimicrobials:  Zosyn  12/29 >>  Vancomycin 12/29 >>  Interval history/Subjective: Tolerated BiPAP overnight. Feels better today.   Objective: Vitals:  Vitals:   02/05/17 0859 02/05/17 1122  BP: 119/73 (!) 116/59  Pulse: 94 82  Resp:    Temp:  (!) 97.5 F (36.4 C)  SpO2:  92%    Exam:   Constitutional:   . Appears calm and comfortable; better today Eyes:  . pupils and irises appear normal . Normal lids ENMT:  . grossly normal hearing  . Lips appear normal Respiratory:  . Poor air movement, no w/r/r.  . Respiratory effort normal.   Cardiovascular:  . RRR, no m/r/g . No LE extremity edema   Skin:  . No rashes, lesions, ulcers . palpation of skin: no induration or nodules Psychiatric:  . Mental status o Mood, affect appropriate o Orientation to person, not place or time . judgement and insight appear impaired    I have personally reviewed the following:    Labs:  CBG high  BMP unremarkable except CO2 49  Hgb 8.5 >> 7.8 >> 7.6  UC noted   Scheduled Meds: . atorvastatin  20 mg Oral Daily  . budesonide (PULMICORT) nebulizer solution  0.25 mg Nebulization BID  . cefUROXime  500 mg Oral BID WC  . gabapentin  100 mg Oral TID  . insulin aspart  0-9 Units Subcutaneous TID WC  . insulin glargine  5 Units Subcutaneous Daily  .  ipratropium-albuterol  3 mL Nebulization Q6H  . isosorbide mononitrate  60 mg Oral Daily  . methylPREDNISolone (SOLU-MEDROL) injection  40 mg Intravenous Q6H  . metoprolol tartrate  12.5 mg Oral BID  . rivaroxaban  20 mg Oral Q supper   Continuous Infusions:   Principal Problem:   Acute on chronic respiratory failure with hypoxia and hypercapnia (HCC) Active Problems:   Type 2  diabetes mellitus with complication, with long-term current use of insulin (HCC)   Chronic respiratory failure (HCC)   Chronic diastolic CHF (congestive heart failure) (HCC)   Mass of left lung   Diabetes mellitus with complication (HCC)   COPD exacerbation (HCC)   Chronic diastolic heart failure (HCC)   Acute metabolic encephalopathy   Pleural effusion, left   Pneumonia   LOS: 3 days

## 2017-02-05 NOTE — Care Management Note (Signed)
Case Management Note Marvetta Gibbons RN, BSN Unit 4E-Case Manager [phone]  Patient Details  Name: [name] MRN: [id number] Date of Birth: 29-Dec-1955  Subjective/Objective:   Pt admitted with Acute on Chronic resp. Failure. Known lung mass, s/p thoracentesis                  Action/Plan: PTA pt lived at home with spouse- PC consulted for [place]- CM to follow for transition of care needs.   Expected Discharge Date:  02/07/17               Expected Discharge Plan:     In-House Referral:     Discharge planning Services  CM Consult  Post Acute Care Choice:    Choice offered to:     DME Arranged:    DME Agency:     HH Arranged:    HH Agency:     Status of Service:  In process, will continue to follow  If discussed at Long Length of Stay Meetings, dates discussed:    Discharge Disposition:   Additional Comments:  Dawayne Patricia, RN 02/05/2017, 2:43 PM

## 2017-02-06 DIAGNOSIS — I5032 Chronic diastolic (congestive) heart failure: Secondary | ICD-10-CM

## 2017-02-06 LAB — GLUCOSE, CAPILLARY
GLUCOSE-CAPILLARY: 194 mg/dL — AB (ref 65–99)
GLUCOSE-CAPILLARY: 248 mg/dL — AB (ref 65–99)
Glucose-Capillary: 362 mg/dL — ABNORMAL HIGH (ref 65–99)
Glucose-Capillary: 313 mg/dL — ABNORMAL HIGH (ref 65–99)

## 2017-02-06 MED ORDER — FUROSEMIDE 10 MG/ML IJ SOLN
40.0000 mg | Freq: Two times a day (BID) | INTRAMUSCULAR | Status: AC
  Administered 2017-02-06 – 2017-02-07 (×2): 40 mg via INTRAVENOUS
  Filled 2017-02-06 (×2): qty 4

## 2017-02-06 NOTE — Progress Notes (Signed)
PROGRESS NOTE  [name] ZDG:[id number] DOB: 1955-03-13 DOA: 02/02/2017 PCP: Brand Males, MD  Brief Narrative: 55yow PMH COPD, lung mass presented with increasing confusion and SOB.  Assessment/Plan Acute on chronic hypoxic, hypercapnic resp failure secondary to large left pleural effusion ("white out"), known left lung mass, suspected pneumonia, COPD exacerbation. S/p large volume thoracentesis 12/29, fluids culture no growth , cytology pending - much better; tolerating BiPAP at night and [place] during day - doubt MRSA or resistant organisms >> change to Ceftin, d/c Vanc -continue steroids (but decrease freq), bronchodilators  Acute encephalopathy secondary to above - improving; continue treatments as above 12/31 -she is alert and oriented to person and placed, she is not oriented to time  Left lung mass. Noted on office visit 04/2016, hospitalization 06/2016 and 09/2016 but patient put off follow-up.  -  f/u cytology, appreciate PMT involvement. Pt continues to improve but doubt can contribute much to [place] today   E coli UTI - treat with Ceftin  Normocytic anemia/anemia of chronic disease - Hgb stable; no bleeding. Monitor intermittently; no indication for transfusion  Anxiety  - appears stable  COPD, chronic hypoxic resp failure on home oxygen 5 L - stabilizing, continue tx as above  DM type 2 on insulin - CBG high, continue SSI, increase Lantus to 20 units daily, add meal coverage 3 units  Chronic diastolic CHF -no lower extremity edema - appears compensated 12/31, monitor I/O - Lasix daily as needed  PMH CVA on Xarelto - continue Xarelto    Aortic atherosclerosis - continue atorvastatin 12/31  Body mass index is 24.52 kg/m.   Continues to improve but prognosis is poor; f/u cytology, appreciate pulm and PMT involvement; husband may need to be decision maker in patient unable to participate  DVT prophylaxis: SCDs Code Status: DNR no intubation  Family  Communication: husband at bedside Disposition Plan: pending    Murray Hodgkins, MD  [facility] Direct contact: [phone] --[place] [place]  --[email or web address]; password [email or web address]  7PM-7AM contact night coverage as above 02/06/2017, 8:02 AM  LOS: 4 days   Consultants:  Pulmonology   Procedures:  12/29 US guided left thoracentesis, ~1280cc of thin serosanguinous fluid aspirated  Antimicrobials:  Zosyn  12/29 >>  Vancomycin 12/29 >>12/31  Interval history/Subjective: Tolerated BiPAP overnight. Feels better today.  Husband at bedside  Objective: Vitals:  Vitals:   02/06/17 0112 02/06/17 0400  BP:  (!) 102/58  Pulse:  77  Resp:  16  Temp:  98.4 F (36.9 C)  SpO2: 100% 100%    Exam:   Constitutional:   . Appears calm and comfortable; better today Eyes:  . pupils and irises appear normal . Normal lids ENMT:  . grossly normal hearing  . Lips appear normal Respiratory:  . Poor air movement, no w/r/r.  . Respiratory effort normal.   Cardiovascular:  . RRR, no m/r/g . No LE extremity edema   Skin:  . No rashes, lesions, ulcers . palpation of skin: no induration or nodules Psychiatric:  . Mental status o Mood, affect appropriate o Orientation to person, not place or time . judgement and insight appear impaired    I have personally reviewed the following:    Labs:  CBG high  BMP unremarkable except CO2 49  Hgb 8.5 >> 7.8 >> 7.6  UC noted   Scheduled Meds: . atorvastatin  20 mg Oral Daily  . budesonide (PULMICORT) nebulizer solution  0.25 mg Nebulization BID  . cefUROXime  500 mg Oral BID  WC  . gabapentin  100 mg Oral TID  . insulin aspart  0-9 Units Subcutaneous TID WC  . insulin aspart  3 Units Subcutaneous TID WC  . insulin glargine  20 Units Subcutaneous Daily  . ipratropium-albuterol  3 mL Nebulization Q6H  . isosorbide mononitrate  60 mg Oral Daily  . methylPREDNISolone (SOLU-MEDROL) injection  40 mg Intravenous Q12H  .  metoprolol tartrate  12.5 mg Oral BID  . rivaroxaban  20 mg Oral Q supper   Continuous Infusions:   Principal Problem:   Acute on chronic respiratory failure with hypoxia and hypercapnia (HCC) Active Problems:   Type 2 diabetes mellitus with complication, with long-term current use of insulin (HCC)   Chronic respiratory failure (HCC)   Chronic diastolic CHF (congestive heart failure) (HCC)   Mass of left lung   Diabetes mellitus with complication (HCC)   COPD exacerbation (HCC)   Chronic diastolic heart failure (HCC)   Acute metabolic encephalopathy   Pleural effusion, left   Pneumonia   LOS: 4 days

## 2017-02-06 NOTE — Progress Notes (Signed)
PULMONARY / CRITICAL CARE MEDICINE   Name: [name] MRN: [id number] DOB: [birth year]    ADMISSION DATE:  02/02/2017  HISTORY OF PRESENT ILLNESS:   [name] a 62 y.o.female former smoker with a 120 pack year smoking history ( Quit 2011)   withhistory of COPD, diabetes mellitus type 2, embolic CVA, CHF and known history of lung mass ( Discovered 11/2015 during hospital admission)  was brought to the ER the patient was found to be increasingly confused by patient's husband. As per the husband patient has not been ambulatory as usual which is progressively worse last 2 weeks. Patient also has been increasingly short of breath. Denied chest pain productive cough fever chills or any loss of consciousness nausea vomiting or diarrhea upon admission. CTA chest was done which was negative for PE but was positive for Diffuse attenuation and narrowing of left-sided pulmonary vessels due to known pulmonary  mass and consolidative process, large left pleural effusion,possible pneumonia and increased AP adenopathy and left adrenal gland measuring 11 mm. Pt's dyspnea did not improve, and she underwent a therapeutic/diagnostic thoracentesis 12/29 with removal of 1280 cc's of thin serosanguinous fluid . Fluid was sent for evaluation. Previous Thora done 09/2016 did not reveal malignant cells. CCM were asked to consult for lung mass and pleural effusion, COPD exacerbation and  Suspected pneumonia. ABG 12/29  revealed CO2 of 101. Bi Pap was initiated with improvement in mental status.  Per husband, patient is non-compliant with her pulmonary nebulizer treatments. She uses her Pro Air and Combivent inhalers " Way too much" Additionally she does not maintain good follow up regarding medical appointments. Husband states she cancels appointments frequently.      SUBJECTIVE:  Awake and confused, states she is breathing better, Wearing oxygen at 2 L  with saturation of 88%.  VITAL SIGNS: BP (!) 96/51 (BP  Location: Right Arm)   Pulse 89   Temp 97.6 F (36.4 C) (Oral)   Resp 18   Ht 5\' 7"  (1.702 m)   Wt 156 lb 8.4 oz (71 kg)   SpO2 97%   BMI 24.52 kg/m     INTAKE / OUTPUT: I/O last 3 completed shifts: In: -  Out: 325 [Urine:325]  PHYSICAL EXAMINATION: General: Awake and alert, wearing nasak oxygen per mouth, NAD Neuro: lethargic , MAE x 4, Oriented to self, not to place or time. Cardiovascular: S1, S2, RRR, No MRG Lungs: Respirations  are unlabored, there is symmetric air movement anteriorly, few scattered rhonchi , crackles per bases Abdomen: Abdomen is grossly obese and soft without any organomegaly masses tenderness guarding or rebound Extremities: Warm, dry and intact Skin: Intact, no rash or lesions noted  LABS:  BMET Recent Labs  Lab 02/03/17 0540 02/04/17 0251 02/05/17 0254  NA 137 135 134*  K 4.0 4.1 3.8  CL 75* 76* 74*  CO2 50* 44* 49*  BUN 5* 10 12  CREATININE 0.58 0.80 0.76  GLUCOSE 128* 201* 259*    Electrolytes Recent Labs  Lab 02/03/17 0540 02/04/17 0251 02/05/17 0254  CALCIUM 9.5 9.1 9.2    CBC Recent Labs  Lab 02/03/17 0540 02/04/17 0251 02/05/17 0254  WBC 12.5* 7.4 12.2*  HGB 8.5* 7.8* 7.6*  HCT 32.6* 29.7* 28.4*  PLT 317 275 281    Coag's No results for input(s): APTT, INR in the last 168 hours.  Sepsis Markers Recent Labs  Lab 02/02/17 1737  LATICACIDVEN 1.14    ABG Recent Labs  Lab 02/03/17 1810  PHART 7.339*  PCO2ART 101*  PO2ART 110*    Liver Enzymes Recent Labs  Lab 02/02/17 1724  AST 12*  ALT 9*  ALKPHOS 78  BILITOT 0.8  ALBUMIN 2.1*    Cardiac Enzymes No results for input(s): TROPONINI, PROBNP in the last 168 hours.  Glucose Recent Labs  Lab 02/05/17 0611 02/05/17 1119 02/05/17 1612 02/05/17 2145 02/06/17 0604 02/06/17 1119  GLUCAP 222* 375* 333* 254* 194* 248*    Imaging No results found.   STUDIES:  12/29 Thora>> Left pleural fluid cytology >>  pending   ANTIBIOTICS: 02/05/2017>>Ceftin>>    DISCUSSION: This is a 62 year old who at baseline has significant COPD.  She presented with altered mental status and hypercarbia.  She was found to have a large left pleural effusion which has been drained.  In addition she has a known left-sided mass, which is not been definitively diagnosed.  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  We are awaiting cytology.  Patient has significant baseline COPD so that even should she prove to stage appropriately she is likely not resectable. She is reporting significant improvement in her dyspnea following her thoracentesis. CXR 12/31>>Mildly worsening aeration in the left mid and lower lung. 2. Persistent left pleural effusion. 3. Left upper lobe mass.  Afebrile Leukocytosis Plan: Lasix as needed per primary for effusion if re-accumulates ABX per primary team CXR 02/08/2016 Trend WBC and fever curve FU pleural fluid cytology>> still pending Continue BiPAP QHS and prn Titrate Oxygen to maintain saturations 88-92% ( 5 L Home oxygen) Continue scheduled BD's  Pulmicort and DuoNebs   NEUROLOGIC A: Status continues to be extremely poor despite addressing her hypercarbic respiratory failure.  Plan:  Goals of care per primary team>> DNR established    Magdalen Spatz, AGACNP-BC Pulmonary and [place] Pager: [phone]  02/06/2017, 3:09 PM

## 2017-02-06 NOTE — Progress Notes (Signed)
Pt. Refused bipap. RT informed pt. To notify if she changes her mind.

## 2017-02-06 NOTE — Progress Notes (Signed)
Pt O2 saturations low. Complaining of SOB. Delsa Sale RN at bedside. Erasmo Downer RN and I at bedside. Pt was on 1L HFNC and had been recently turned in the bed. Dr. Lake Bells at bedside. Advised to increase HFNC O2 level. Increased to 7L for approximately 5 minutes. Pt saturations increased to 98%. Nasal cannula is placed in patient's mouth per MD recommendation.   Per Dr. Lake Bells pt wears 4-5 L of oxygen at home. This is the goal therapy for this patient. O2 saturation parameters for this patient are 88%-94%.   Pt requesting medication for pain. Jen RN to given 2mg  morphine per PRN orders. Pt resting comfortably watching TV. Denies additional needs at this time. Call bell within reach, will continue to monitor.   Fritz Pickerel, RN

## 2017-02-07 ENCOUNTER — Inpatient Hospital Stay (HOSPITAL_COMMUNITY): Payer: BLUE CROSS/BLUE SHIELD

## 2017-02-07 LAB — BASIC METABOLIC PANEL
BUN: 16 mg/dL (ref 6–20)
CHLORIDE: 78 mmol/L — AB (ref 101–111)
Calcium: 9 mg/dL (ref 8.9–10.3)
Creatinine, Ser: 0.9 mg/dL (ref 0.44–1.00)
GFR calc non Af Amer: >60 mL/min (ref >60)
Glucose, Bld: 292 mg/dL — ABNORMAL HIGH (ref 65–99)
POTASSIUM: 4.9 mmol/L (ref 3.5–5.1)
SODIUM: 136 mmol/L (ref 135–145)

## 2017-02-07 LAB — TYPE AND SCREEN
ABO/RH(D): O POS
Antibody Screen: NEGATIVE

## 2017-02-07 LAB — CBC
HCT: 30.8 % — ABNORMAL LOW (ref 36.0–46.0)
Hemoglobin: 7.9 g/dL — ABNORMAL LOW (ref 12.0–15.0)
MCH: 24.7 pg — ABNORMAL LOW (ref 26.0–34.0)
MCHC: 25.6 g/dL — AB (ref 30.0–36.0)
MCV: 96.3 fL (ref 78.0–100.0)
Platelets: 238 10*3/uL (ref 150–400)
RBC: 3.2 MIL/uL — ABNORMAL LOW (ref 3.87–5.11)
RDW: 14.5 % (ref 11.5–15.5)
WBC: 8.3 10*3/uL (ref 4.0–10.5)

## 2017-02-07 LAB — GLUCOSE, CAPILLARY
GLUCOSE-CAPILLARY: 236 mg/dL — AB (ref 65–99)
GLUCOSE-CAPILLARY: 205 mg/dL — AB (ref 65–99)
Glucose-Capillary: 196 mg/dL — ABNORMAL HIGH (ref 65–99)
Glucose-Capillary: 368 mg/dL — ABNORMAL HIGH (ref 65–99)

## 2017-02-07 LAB — ABO/RH: ABO/RH(D): O POS

## 2017-02-07 MED ORDER — IPRATROPIUM-ALBUTEROL 0.5-2.5 (3) MG/3ML IN SOLN
3.0000 mL | Freq: Three times a day (TID) | RESPIRATORY_TRACT | Status: DC
  Administered 2017-02-08 – 2017-02-10 (×6): 3 mL via RESPIRATORY_TRACT
  Filled 2017-02-07 (×8): qty 3

## 2017-02-07 MED ORDER — INSULIN GLARGINE 100 UNIT/ML ~~LOC~~ SOLN
25.0000 [IU] | Freq: Every day | SUBCUTANEOUS | Status: DC
  Administered 2017-02-07 – 2017-02-10 (×4): 25 [IU] via SUBCUTANEOUS
  Filled 2017-02-07 (×4): qty 0.25

## 2017-02-07 NOTE — Progress Notes (Signed)
Patient refuses to wear BIPAP.

## 2017-02-07 NOTE — Discharge Instructions (Signed)
Information on my medicine - XARELTO (Rivaroxaban)  This medication education was reviewed with me or my healthcare representative as part of my discharge preparation.    Why was Xarelto prescribed for you? Xarelto was prescribed for you to reduce the risk of a blood clot forming that can cause a stroke.  What do you need to know about xarelto ? Take your Xarelto ONCE DAILY at the same time every day with your evening meal. If you have difficulty swallowing the tablet whole, you may crush it and mix in applesauce just prior to taking your dose.  Take Xarelto exactly as prescribed by your doctor and DO NOT stop taking Xarelto without talking to the doctor who prescribed the medication.  Stopping without other stroke prevention medication to take the place of Xarelto may increase your risk of developing a clot that causes a stroke.  Refill your prescription before you run out.  After discharge, you should have regular check-up appointments with your healthcare provider that is prescribing your Xarelto.  In the future your dose may need to be changed if your kidney function or weight changes by a significant amount.  What do you do if you miss a dose? If you are taking Xarelto ONCE DAILY and you miss a dose, take it as soon as you remember on the same day then continue your regularly scheduled once daily regimen the next day. Do not take two doses of Xarelto at the same time or on the same day.   Important Safety Information A possible side effect of Xarelto is bleeding. You should call your healthcare provider right away if you experience any of the following: ? Bleeding from an injury or your nose that does not stop. ? Unusual colored urine (red or dark brown) or unusual colored stools (red or black). ? Unusual bruising for unknown reasons. ? A serious fall or if you hit your head (even if there is no bleeding).  Some medicines may interact with Xarelto and might increase your  risk of bleeding while on Xarelto. To help avoid this, consult your healthcare provider or pharmacist prior to using any new prescription or non-prescription medications, including herbals, vitamins, non-steroidal anti-inflammatory drugs (NSAIDs) and supplements.  This website has more information on Xarelto: [email or web address]

## 2017-02-07 NOTE — Progress Notes (Addendum)
Inpatient Diabetes Program Recommendations  AACE/ADA: New Consensus Statement on Inpatient Glycemic Control (2015)  Target Ranges:  Prepandial:   less than 140 mg/dL      Peak postprandial:   less than 180 mg/dL (1-2 hours)      Critically ill patients:  140 - 180 mg/dL  Results for [name], [name] (MRN [id number]) as of 02/07/2017 09:40  Ref. Range 02/06/2017 06:04 02/06/2017 11:19 02/06/2017 16:00 02/06/2017 21:18 02/07/2017 06:18  Glucose-Capillary Latest Ref Range: 65 - 99 mg/dL 194 (H) 248 (H) 362 (H) 313 (H) 236 (H)    Review of Glycemic Control  Outpatient Diabetes medications: Tresiba 20 units daily, Novolog 20 units BID Current orders for Inpatient glycemic control: Lantus 25 units daily, Novolog 3 units TID with meals, Novolog 0-9 units TID; Solumedrol 40 mg Q12H  Inpatient Diabetes Program Recommendations:  Insulin - Basal: Noted Lantus was increased from 20 to 25 units daily. Correction (SSI): Please consider ordering Novolog 0-5 units QHS for bedtime correction. Insulin - Meal Coverage: If post prandial glucose continues to be elevated with increased dose of Lantus and steroids are continued, may want to consider increasing meal coverage to Novolog 5 units TID. Diet: If appropriate, please consider discontinuing Regular diet and ordering Carb Modified diet.  Thanks, Barnie Alderman, RN, MSN, CDE Diabetes Coordinator Inpatient Diabetes Program [phone] (Team Pager from 8am to 5pm)

## 2017-02-07 NOTE — Progress Notes (Signed)
PULMONARY / CRITICAL CARE MEDICINE   Name: Frida Wahlstrom MRN: [id number] DOB: August 03, 1955    ADMISSION DATE:  02/02/2017  HISTORY OF PRESENT ILLNESS:   Ta Douglasis a 62 y.o.female former smoker with a 120 pack year smoking history ( Quit 2011)   withhistory of COPD, diabetes mellitus type 2, embolic CVA, CHF and known history of lung mass ( Discovered 11/2015 during hospital admission)  was brought to the ER the patient was found to be increasingly confused by patient's husband. As per the husband patient has not been ambulatory as usual which is progressively worse last 2 weeks. Patient also has been increasingly short of breath. Denied chest pain productive cough fever chills or any loss of consciousness nausea vomiting or diarrhea upon admission. CTA chest was done which was negative for PE but was positive for Diffuse attenuation and narrowing of left-sided pulmonary vessels due to known pulmonary  mass and consolidative process, large left pleural effusion,possible pneumonia and increased AP adenopathy and left adrenal gland measuring 11 mm. Pt's dyspnea did not improve, and she underwent a therapeutic/diagnostic thoracentesis 12/29 with removal of 1280 cc's of thin serosanguinous fluid . Fluid was sent for evaluation. Previous Thora done 09/2016 did not reveal malignant cells. CCM were asked to consult for lung mass and pleural effusion, COPD exacerbation and  Suspected pneumonia. ABG 12/29  revealed CO2 of 101. Bi Pap was initiated with improvement in mental status.  Per husband, patient is non-compliant with her pulmonary nebulizer treatments. She uses her Pro Air and Combivent inhalers " Way too much" Additionally she does not maintain good follow up regarding medical appointments. Husband states she cancels appointments frequently.     SUBJECTIVE:  On 3L [place], SpO2 in 90s.  Repeat CXR with progressive effusion and atelectasis.  VITAL SIGNS: BP 135/65 (BP Location: Left Arm)   Pulse  (!) 103   Temp 98.4 F (36.9 C) (Oral)   Resp (!) 21   Ht 5\' 7"  (1.702 m)   Wt 71 kg (156 lb 8.4 oz)   SpO2 100%   BMI 24.52 kg/m     INTAKE / OUTPUT: I/O last 3 completed shifts: In: 1080 [P.O.:1080] Out: 1925 [Urine:1925]  PHYSICAL EXAMINATION: General: Awake and alert, wearing nasal oxygen per mouth (this is what she usually does), in NAD. Neuro: Somnolent but easily arouses to voice, A&O x 3, no deficits. Cardiovascular: S1, S2, RRR, No MRG. Lungs: Respirations are unlabored, there is symmetric air movement anteriorly, few scattered rhonchi , crackles per bases. Abdomen: Obese, BS x 4, S/NT/ND. Extremities: Warm, dry and intact. Skin: Intact, no rash or lesions noted.  LABS:  BMET Recent Labs  Lab 02/04/17 0251 02/05/17 0254 02/07/17 0358  NA 135 134* 136  K 4.1 3.8 4.9  CL 76* 74* 78*  CO2 44* 49* >50*  BUN 10 12 16   CREATININE 0.80 0.76 0.90  GLUCOSE 201* 259* 292*    Electrolytes Recent Labs  Lab 02/04/17 0251 02/05/17 0254 02/07/17 0358  CALCIUM 9.1 9.2 9.0    CBC Recent Labs  Lab 02/04/17 0251 02/05/17 0254 02/07/17 0358  WBC 7.4 12.2* 8.3  HGB 7.8* 7.6* 7.9*  HCT 29.7* 28.4* 30.8*  PLT 275 281 238    Coag's No results for input(s): APTT, INR in the last 168 hours.  Sepsis Markers Recent Labs  Lab 02/02/17 1737  LATICACIDVEN 1.14    ABG Recent Labs  Lab 02/03/17 1810  PHART 7.339*  PCO2ART 101*  PO2ART 110*  Liver Enzymes Recent Labs  Lab 02/02/17 1724  AST 12*  ALT 9*  ALKPHOS 78  BILITOT 0.8  ALBUMIN 2.1*    Cardiac Enzymes No results for input(s): TROPONINI, PROBNP in the last 168 hours.  Glucose Recent Labs  Lab 02/05/17 2145 02/06/17 0604 02/06/17 1119 02/06/17 1600 02/06/17 2118 02/07/17 0618  GLUCAP 254* 194* 248* 362* 313* 236*    Imaging Dg Chest Port 1 View  Result Date: 02/07/2017 CLINICAL DATA:  Short of breath respiratory failure EXAM: PORTABLE CHEST 1 VIEW COMPARISON:  02/05/2017  FINDINGS: Left upper lobe mass lesion unchanged. Progressive left effusion and left lower lobe atelectasis. Right lung remains clear. Prior CABG. IMPRESSION: Left upper lobe mass lesion. Progression of left lower lobe atelectasis and effusion. Electronically Signed   By: Franchot Gallo [name]   On: 02/07/2017 07:04     STUDIES:  12/29 Thora>> Left pleural fluid cytology >> pending  ANTIBIOTICS: 02/05/2017>>Ceftin>>    DISCUSSION: This is a 63 year old who at baseline has significant COPD.  She presented with altered mental status and hypercarbia.  She was found to have a large left pleural effusion which has been drained.  In addition she has a known left-sided mass, which is not been definitively diagnosed.  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  Patient has significant baseline COPD; therefore, even should she prove to stage appropriately, she is likely not a candidate for resection. She had left thora 12/29 with 1280cc of fluid removed, exudate and cytology pending.  Unfortunately her repeat CXR 1/2 shows progression / recurrence of effusion. Hypercapnic respiratory failure - started on PRN BiPAP. Plan: Lasix as needed. Abx per primary team. Follow pleural fluid cytology > still pending 1/2 (called cytology to verify, pathologist is still reviewing and they are hoping to have some results today or tomorrow) Continue BiPAP QHS and prn. Titrate Oxygen to maintain saturations 88-92% ( 5 L Home oxygen). Continue scheduled BD's  Pulmicort and DuoNebs. DNR established - fully agree. If cytology is positive for malignancy, then can consider pleur-x placement as anticipate frequent rapid re-accumulation. If positive for malignancy, pt not sure whether she would even want to pursue any type of chemo, etc.  She is to discuss this further with her husband but would like  to wait for cytology results first.   Rest per primary team.   Montey Hora, [place] [facility] [place] Pager: [phone]  or [phone] 02/07/2017, 9:17 AM

## 2017-02-07 NOTE — Progress Notes (Signed)
PROGRESS NOTE  [name] WHQ:[id number] DOB: 09/24/1955 DOA: 02/02/2017 PCP: Brand Males, MD  Brief Narrative: 72yow PMH COPD, lung mass presented with increasing confusion and SOB.  Assessment/Plan  Acute on chronic hypoxic, hypercapnic resp failure secondary to large left pleural effusion ("white out"), known left lung mass, suspected pneumonia, COPD exacerbation. S/p large volume thoracentesis 12/29, fluids culture no growth , cytology pending - she refused BiPAP at night intermittently, husband reports bipap can be put on her only when she is drowsy and confused. - she was treated with vanc/zosyn since admission, pleural fluids culture negative, no fever, d/c abx. -continue steroids (but decrease freq), bronchodilators -family is waiting for cytology to decide further action, pulmonary and palliative care consulted.  Left lung mass.  -Noted on office visit 04/2016, hospitalization 06/2016 and 09/2016 but patient put off follow-up. Husband reports patient never recuperate enough to make it to follow up appointment. -  f/u cytology, appreciate PMT involvement.    COPD, chronic hypoxic resp failure on home oxygen 5 L - stabilizing, continue tx as above -co2 retention, she refuses night time bipap  E coli UTI - she was on zosyn then Ceftin, finished total of 5 day treatment as of 1/2.  Acute encephalopathy secondary to above - improving; continue treatments as above  -she is alert and oriented to person and placed, she is not oriented to time  Chronic diastolic CHF -no lower extremity edema - appears compensated 12/31, monitor I/O - Lasix daily as needed  HTN; bp borderline Continue betablocker, lasix, hold imdur  DM type 2 on insulin - CBG high, partly due to on steroids Continue adjust SSI,  Lantus   PMH CVA on Xarelto - continue Xarelto    Aortic atherosclerosis - continue atorvastatin 12/31  Normocytic anemia/anemia of chronic disease - Hgb stable; no  bleeding. Monitor intermittently; no indication for transfusion  Anxiety  - appears stable   Body mass index is 24.52 kg/m.   Continues to improve but prognosis is poor; f/u cytology, appreciate pulm and PMT involvement; husband and patient is waiting on pathology before making any decisions for next step.   DVT prophylaxis: SCDs on xarelto Code Status: DNR no intubation  Family Communication: husband at bedside Disposition Plan: pending    Florencia Reasons, MD PhD [facility] Direct contact: [phone] --[place] [place]  --[email or web address]; password [email or web address]  7PM-7AM contact night coverage as above 02/07/2017, 7:53 AM  LOS: 5 days   Consultants:  Pulmonology  Palliative care   Procedures:  12/29 US guided left thoracentesis, ~1280cc of thin serosanguinous fluid aspirated  Antimicrobials:  Zosyn  12/29 >>12/31  Vancomycin 12/29 >>12/31  ceftin from 12/31 to 1/2  Interval history/Subjective: She refused  BiPAP last night Am blood sugar elevated, on steroids  Husband at bedside  Objective: Vitals:  Vitals:   02/07/17 0000 02/07/17 0400  BP: (!) 109/58 135/65  Pulse: (!) 104 (!) 103  Resp: 18 (!) 21  Temp: 98.2 F (36.8 C) 98.4 F (36.9 C)  SpO2: 98% 100%    Exam:   Constitutional:   . Appears calm and comfortable; alert Eyes:  . pupils and irises appear normal . Normal lids ENMT:  . grossly normal hearing  . Lips appear normal Respiratory:  . Poor air movement, no w/r/r.  . Respiratory effort normal.   Cardiovascular:  . RRR, no m/r/g . No LE extremity edema   Skin:  . No rashes, lesions, ulcers . palpation of skin: no induration or nodules Psychiatric:  .  Mental status o Mood, affect appropriate o Orientation to person and place, not to time . judgement and insight appear impaired    I have personally reviewed the following:    Labs:  CBG high  BMP unremarkable except CO2 49  Hgb 8.5 >> 7.8 >> 7.6  UC noted   Scheduled  Meds: . atorvastatin  20 mg Oral Daily  . budesonide (PULMICORT) nebulizer solution  0.25 mg Nebulization BID  . cefUROXime  500 mg Oral BID WC  . gabapentin  100 mg Oral TID  . insulin aspart  0-9 Units Subcutaneous TID WC  . insulin aspart  3 Units Subcutaneous TID WC  . insulin glargine  20 Units Subcutaneous Daily  . ipratropium-albuterol  3 mL Nebulization Q6H  . isosorbide mononitrate  60 mg Oral Daily  . methylPREDNISolone (SOLU-MEDROL) injection  40 mg Intravenous Q12H  . metoprolol tartrate  12.5 mg Oral BID  . rivaroxaban  20 mg Oral Q supper   Continuous Infusions:   Principal Problem:   Acute on chronic respiratory failure with hypoxia and hypercapnia (HCC) Active Problems:   Type 2 diabetes mellitus with complication, with long-term current use of insulin (HCC)   Chronic respiratory failure (HCC)   Chronic diastolic CHF (congestive heart failure) (HCC)   Mass of left lung   Diabetes mellitus with complication (HCC)   COPD exacerbation (HCC)   Chronic diastolic heart failure (HCC)   Acute metabolic encephalopathy   Pleural effusion, left   Pneumonia   LOS: 5 days

## 2017-02-08 ENCOUNTER — Inpatient Hospital Stay (HOSPITAL_COMMUNITY): Payer: BLUE CROSS/BLUE SHIELD

## 2017-02-08 DIAGNOSIS — R109 Unspecified abdominal pain: Secondary | ICD-10-CM

## 2017-02-08 DIAGNOSIS — Z515 Encounter for palliative care: Secondary | ICD-10-CM

## 2017-02-08 DIAGNOSIS — N390 Urinary tract infection, site not specified: Secondary | ICD-10-CM

## 2017-02-08 DIAGNOSIS — R4182 Altered mental status, unspecified: Secondary | ICD-10-CM

## 2017-02-08 DIAGNOSIS — Z7189 Other specified counseling: Secondary | ICD-10-CM

## 2017-02-08 DIAGNOSIS — Z794 Long term (current) use of insulin: Secondary | ICD-10-CM

## 2017-02-08 DIAGNOSIS — J441 Chronic obstructive pulmonary disease with (acute) exacerbation: Secondary | ICD-10-CM

## 2017-02-08 LAB — BASIC METABOLIC PANEL
Anion gap: 8 (ref 5–15)
BUN: 14 mg/dL (ref 6–20)
CHLORIDE: 76 mmol/L — AB (ref 101–111)
CO2: 48 mmol/L — AB (ref 22–32)
Calcium: 8.6 mg/dL — ABNORMAL LOW (ref 8.9–10.3)
Creatinine, Ser: 0.66 mg/dL (ref 0.44–1.00)
GFR calc Af Amer: >60 mL/min (ref >60)
GFR calc non Af Amer: >60 mL/min (ref >60)
Glucose, Bld: 247 mg/dL — ABNORMAL HIGH (ref 65–99)
POTASSIUM: 4.9 mmol/L (ref 3.5–5.1)
Sodium: 132 mmol/L — ABNORMAL LOW (ref 135–145)

## 2017-02-08 LAB — CULTURE, BODY FLUID W GRAM STAIN -BOTTLE: Culture: NO GROWTH

## 2017-02-08 LAB — CBC
HEMATOCRIT: 30.5 % — AB (ref 36.0–46.0)
HEMOGLOBIN: 7.8 g/dL — AB (ref 12.0–15.0)
MCH: 23.9 pg — AB (ref 26.0–34.0)
MCHC: 25.6 g/dL — ABNORMAL LOW (ref 30.0–36.0)
MCV: 93.3 fL (ref 78.0–100.0)
Platelets: 214 10*3/uL (ref 150–400)
RBC: 3.27 MIL/uL — AB (ref 3.87–5.11)
RDW: 14.2 % (ref 11.5–15.5)
WBC: 9.1 10*3/uL (ref 4.0–10.5)

## 2017-02-08 LAB — GLUCOSE, CAPILLARY
GLUCOSE-CAPILLARY: 213 mg/dL — AB (ref 65–99)
GLUCOSE-CAPILLARY: 192 mg/dL — AB (ref 65–99)
Glucose-Capillary: 310 mg/dL — ABNORMAL HIGH (ref 65–99)
Glucose-Capillary: 153 mg/dL — ABNORMAL HIGH (ref 65–99)

## 2017-02-08 LAB — CULTURE, BODY FLUID-BOTTLE

## 2017-02-08 LAB — HEMOGLOBIN A1C
Hgb A1c MFr Bld: 6.4 % — ABNORMAL HIGH (ref 4.8–5.6)
Mean Plasma Glucose: 136.98 mg/dL

## 2017-02-08 LAB — TSH: TSH: 0.888 u[IU]/mL (ref 0.350–4.500)

## 2017-02-08 LAB — LACTATE DEHYDROGENASE: LDH: 104 U/L (ref 98–192)

## 2017-02-08 MED ORDER — POLYETHYLENE GLYCOL 3350 17 G PO PACK
17.0000 g | PACK | Freq: Two times a day (BID) | ORAL | Status: DC
  Administered 2017-02-08 – 2017-02-09 (×2): 17 g via ORAL
  Filled 2017-02-08 (×4): qty 1

## 2017-02-08 MED ORDER — SENNOSIDES-DOCUSATE SODIUM 8.6-50 MG PO TABS
1.0000 | ORAL_TABLET | Freq: Two times a day (BID) | ORAL | Status: DC
  Administered 2017-02-08 – 2017-02-09 (×3): 1 via ORAL
  Filled 2017-02-08 (×4): qty 1

## 2017-02-08 NOTE — Progress Notes (Signed)
PROGRESS NOTE    [name]  WYO:[id number] DOB: 1955-08-23 DOA: 02/02/2017 PCP: Brand Males, MD  Brief Narrative: [name] is a 62 y.o. female with history of COPD, diabetes mellitus type 2, embolic CVA, CHF and known history of lung mass was brought to the ER the patient was found to be increasingly confused by patient's husband.  As per the husband patient has not been ambulatory as usual which is progressively worse last 2 weeks.  Patient also has been increasingly short of breath.  Denies any chest pain productive cough fever chills or any loss of consciousness nausea vomiting or diarrhea.  In the ER patient is found to be hypoxic and short of breath.  Oriented to her name is able to recognize her husband and follows commands.  Chest x-ray shows left-sided opacification concerning for pleural effusion and pneumonia.  UA shows features concerning for UTI.  CT head was unremarkable CT angiogram of the chest was negative for pulmonary embolism but did show large left pleural effusion with possible consolidation.  Lung mass was also seen. She underwent thoracentesis with re-accumulation of Fluid. Pulmonary consulted and working mass up and will likely biopsy it at some point.  Assessment & Plan:   Principal Problem:   Acute on chronic respiratory failure with hypoxia and hypercapnia (HCC) Active Problems:   Type 2 diabetes mellitus with complication, with long-term current use of insulin (HCC)   Chronic respiratory failure (HCC)   Chronic diastolic CHF (congestive heart failure) (HCC)   Mass of left lung   Diabetes mellitus with complication (HCC)   COPD exacerbation (HCC)   Chronic diastolic heart failure (HCC)   Acute metabolic encephalopathy   Pleural effusion, left   Pneumonia  Acute on chronic hypoxic, hypercapnic resp failure secondary to large left pleural effusion, known left lung mass, suspected pneumonia, COPD exacerbation.  -S/p large volume thoracentesis 12/29,  fluids culture no growth , cytology negative for Malignancy - she refused BiPAP at night intermittently, husband reports bipap can be put on her only when she is drowsy and confused. - she was treated with vanc/zosyn since admission, pleural fluids culture negative, no fever, d/c abx. -continue steroids (but decrease freq), bronchodilators -Pulmonary ordering CT of Chest to find best approach for tissue diagnosis to help plan palliation  Left lung mass.  -Noted on office visit 04/2016, hospitalization 06/2016 and 09/2016 but patient put off follow-up. Husband reports patient never recuperate enough to make it to follow up appointment. -/u cytology Negative,  -appreciate PMT involvement and recommendation is to continue targeted pulmonary treatments, steroids as well as Morphine for Dyspna    COPD, chronic hypoxic resp failure on home oxygen 5 L -stabilizing, continue tx as above -co2 retention, she refuses night time bipap  E coli UTI -she was on zosyn then Ceftin, finished total of 5 day treatment as of 1/2.  Acute encephalopathy secondary to above - improving; continue treatments as above  -she is alert and oriented to person and placed, she is not oriented to time  Chronic diastolic CHF -no lower extremity edema - appears compensated  -C/w Strict Is/O's - Lasix daily as needed  HTN; bp borderline -Continue betablocker, lasix, hold imdur  DM type 2 on insulin - CBG high, partly due to on steroids -Continue to adjust SSI,  Lantus  -CBG's ranging from 192-310  PMH CVA on Xarelto - continue Xarelto and Atorvastatin 20 mg po qHS  Aortic atherosclerosis - continue atorvastatin as above  Normocytic anemia/anemia of chronic  disease - Hgb stable; no bleeding. Monitor intermittently; no indication for transfusion  Anxiety  - Appears stable  DVT prophylaxis: Anticoagulated with Rivaroxaban Code Status: DO NOT RESUSCITATE Family Communication: Husband at bedside    Disposition Plan: Remain Inpatient for further workup  Consultants:   Pulmonary  Palliative Care Medicine     Procedures:   12/29 US guided left thoracentesis, ~1280cc of thin serosanguinous fluid aspirated   Antimicrobials:  Anti-infectives (From admission, onward)   Start     Dose/Rate Route Frequency Ordered Stop   02/05/17 1200  amoxicillin-clavulanate (AUGMENTIN) 875-125 MG per tablet 1 tablet  Status:  Discontinued     1 tablet Oral Every 12 hours 02/05/17 1033 02/05/17 1042   02/05/17 1200  cefUROXime (CEFTIN) tablet 500 mg  Status:  Discontinued     500 mg Oral 2 times daily with meals 02/05/17 1042 02/07/17 1712   02/03/17 2000  vancomycin (VANCOCIN) IVPB 1000 mg/200 mL premix  Status:  Discontinued     1,000 mg 200 mL/hr over 60 Minutes Intravenous Every 12 hours 02/03/17 0703 02/05/17 1033   02/03/17 1400  piperacillin-tazobactam (ZOSYN) IVPB 3.375 g  Status:  Discontinued     3.375 g 12.5 mL/hr over 240 Minutes Intravenous Every 8 hours 02/03/17 0703 02/05/17 1033   02/03/17 0715  vancomycin (VANCOCIN) 1,500 mg in sodium chloride 0.9 % 500 mL IVPB     1,500 mg 250 mL/hr over 120 Minutes Intravenous  Once 02/03/17 0703 02/03/17 1011   02/03/17 0715  piperacillin-tazobactam (ZOSYN) IVPB 3.375 g     3.375 g 100 mL/hr over 30 Minutes Intravenous  Once 02/03/17 0703 02/03/17 0845   02/02/17 2000  cefTRIAXone (ROCEPHIN) 1 g in dextrose 5 % 50 mL IVPB     1 g 100 mL/hr over 30 Minutes Intravenous  Once 02/02/17 1955 02/02/17 2128     Subjective: Seen and examined at bedside and stated she was hurting. SOB stable. No CP. Awaiting to speak with Pulmonary.   Objective: Vitals:   02/07/17 1954 02/07/17 1959 02/07/17 2332 02/08/17 0349  BP:    123/63  Pulse:  89    Resp:  17    Temp:   98.8 F (37.1 C) 98.7 F (37.1 C)  TempSrc:   Oral Oral  SpO2: 99% 100%    Weight:    73.2 kg (161 lb 6 oz)  Height:        Intake/Output Summary (Last 24 hours) at 02/08/2017  0758 Last data filed at 02/08/2017 0352 Gross per 24 hour  Intake -  Output 1500 ml  Net -1500 ml   Filed Weights   02/05/17 0445 02/06/17 0400 02/08/17 0349  Weight: 70.6 kg (155 lb 10.3 oz) 71 kg (156 lb 8.4 oz) 73.2 kg (161 lb 6 oz)   Examination: Physical Exam:  Constitutional: WN/WD, NAD and appears calm and comfortable Eyes: PERRL, lids and conjunctivae normal, sclerae anicteric  ENMT: External Ears, Nose appear normal. Grossly normal hearing. Mucous membranes are moist.  Neck: Appears normal, supple, no cervical masses, normal ROM, no appreciable thyromegaly Respiratory: Diminished to auscultation bilaterally, no wheezing, rales, rhonchi or crackles. Normal respiratory effort and patient is not tachypenic. No accessory muscle use.  Cardiovascular: RRR, no murmurs / rubs / gallops. S1 and S2 auscultated.  Abdomen: Soft, non-tender, non-distended. No masses palpated. No appreciable hepatosplenomegaly. Bowel sounds positive x4.  GU: Deferred. Musculoskeletal: No clubbing / cyanosis of digits/nails. No joint deformity upper and lower extremities Skin: No rashes, lesions,  ulcers on a limited skin eval. No induration; Warm and dry.  Neurologic: CN 2-12 grossly intact with no focal deficits. Romberg sign cerebellar reflexes not assessed.  Psychiatric: Normal judgment and insight. Alert and oriented x 3. Normal mood and appropriate affect.   Data Reviewed: I have personally reviewed following labs and imaging studies  CBC: Recent Labs  Lab 02/02/17 1724 02/03/17 0540 02/04/17 0251 02/05/17 0254 02/07/17 0358 02/08/17 0317  WBC 10.6* 12.5* 7.4 12.2* 8.3 9.1  NEUTROABS 8.8*  --   --   --   --   --   HGB 8.3* 8.5* 7.8* 7.6* 7.9* 7.8*  HCT 32.2* 32.6* 29.7* 28.4* 30.8* 30.5*  MCV 95.8 95.9 95.5 93.1 96.3 93.3  PLT 311 317 275 281 238 226   Basic Metabolic Panel: Recent Labs  Lab 02/03/17 0540 02/04/17 0251 02/05/17 0254 02/07/17 0358 02/08/17 0317  NA 137 135 134* 136  132*  K 4.0 4.1 3.8 4.9 4.9  CL 75* 76* 74* 78* 76*  CO2 50* 44* 49* >50* 48*  GLUCOSE 128* 201* 259* 292* 247*  BUN 5* 10 12 16 14   CREATININE 0.58 0.80 0.76 0.90 0.66  CALCIUM 9.5 9.1 9.2 9.0 8.6*   GFR: Estimated Creatinine Clearance: 70 mL/min (by C-G formula based on SCr of 0.66 mg/dL). Liver Function Tests: Recent Labs  Lab 02/02/17 1724  AST 12*  ALT 9*  ALKPHOS 78  BILITOT 0.8  PROT 7.6  ALBUMIN 2.1*   No results for input(s): LIPASE, AMYLASE in the last 168 hours. No results for input(s): AMMONIA in the last 168 hours. Coagulation Profile: No results for input(s): INR, PROTIME in the last 168 hours. Cardiac Enzymes: No results for input(s): CKTOTAL, CKMB, CKMBINDEX, TROPONINI in the last 168 hours. BNP (last 3 results) No results for input(s): PROBNP in the last 8760 hours. HbA1C: Recent Labs    02/08/17 0317  HGBA1C 6.4*   CBG: Recent Labs  Lab 02/07/17 0618 02/07/17 1159 02/07/17 1637 02/07/17 2148 02/08/17 0616  GLUCAP 236* 205* 196* 368* 213*   Lipid Profile: No results for input(s): CHOL, HDL, LDLCALC, TRIG, CHOLHDL, LDLDIRECT in the last 72 hours. Thyroid Function Tests: Recent Labs    02/08/17 0317  TSH 0.888   Anemia Panel: No results for input(s): VITAMINB12, FOLATE, FERRITIN, TIBC, IRON, RETICCTPCT in the last 72 hours. Sepsis Labs: Recent Labs  Lab 02/02/17 1737  LATICACIDVEN 1.14    Recent Results (from the past 240 hour(s))  Urine culture     Status: Abnormal   Collection Time: 02/02/17  7:56 PM  Result Value Ref Range Status   Specimen Description URINE, RANDOM  Final   Special Requests NONE  Final   Culture >=100,000 COLONIES/mL ESCHERICHIA COLI (A)  Final   Report Status 02/04/2017 FINAL  Final   Organism ID, Bacteria ESCHERICHIA COLI (A)  Final      Susceptibility   Escherichia coli - MIC*    AMPICILLIN >=32 RESISTANT Resistant     CEFAZOLIN <=4 SENSITIVE Sensitive     CEFTRIAXONE <=1 SENSITIVE Sensitive      CIPROFLOXACIN <=0.25 SENSITIVE Sensitive     GENTAMICIN <=1 SENSITIVE Sensitive     IMIPENEM <=0.25 SENSITIVE Sensitive     NITROFURANTOIN <=16 SENSITIVE Sensitive     TRIMETH/SULFA <=20 SENSITIVE Sensitive     AMPICILLIN/SULBACTAM >=32 RESISTANT Resistant     PIP/TAZO <=4 SENSITIVE Sensitive     Extended ESBL NEGATIVE Sensitive     * >=100,000 COLONIES/mL ESCHERICHIA COLI  Culture, body fluid-bottle     Status: None (Preliminary result)   Collection Time: 02/03/17  2:53 PM  Result Value Ref Range Status   Specimen Description PLEURAL LEFT  Final   Special Requests NONE  Final   Culture NO GROWTH 4 DAYS  Final   Report Status PENDING  Incomplete  Gram stain     Status: None   Collection Time: 02/03/17  2:53 PM  Result Value Ref Range Status   Specimen Description PLEURAL LEFT  Final   Special Requests NONE  Final   Gram Stain NO WBC SEEN NO ORGANISMS SEEN   Final   Report Status 02/03/2017 FINAL  Final    Radiology Studies: Dg Chest Port 1 View  Result Date: 02/07/2017 CLINICAL DATA:  Short of breath respiratory failure EXAM: PORTABLE CHEST 1 VIEW COMPARISON:  02/05/2017 FINDINGS: Left upper lobe mass lesion unchanged. Progressive left effusion and left lower lobe atelectasis. Right lung remains clear. Prior CABG. IMPRESSION: Left upper lobe mass lesion. Progression of left lower lobe atelectasis and effusion. Electronically Signed   By: Franchot Gallo [name]   On: 02/07/2017 07:04   Scheduled Meds: . atorvastatin  20 mg Oral Daily  . budesonide (PULMICORT) nebulizer solution  0.25 mg Nebulization BID  . gabapentin  100 mg Oral TID  . insulin aspart  0-9 Units Subcutaneous TID WC  . insulin aspart  3 Units Subcutaneous TID WC  . insulin glargine  25 Units Subcutaneous Daily  . ipratropium-albuterol  3 mL Nebulization TID  . methylPREDNISolone (SOLU-MEDROL) injection  40 mg Intravenous Q12H  . metoprolol tartrate  12.5 mg Oral BID  . rivaroxaban  20 mg Oral Q supper    Continuous Infusions:   LOS: 6 days   Kerney Elbe, DO [facility] Pager [phone]  If 7PM-7AM, please contact night-coverage [email or web address] Password [facility] 02/08/2017, 7:58 AM

## 2017-02-08 NOTE — Progress Notes (Addendum)
Inpatient Diabetes Program Recommendations  AACE/ADA: New Consensus Statement on Inpatient Glycemic Control (2015)  Target Ranges:  Prepandial:   less than 140 mg/dL      Peak postprandial:   less than 180 mg/dL (1-2 hours)      Critically ill patients:  140 - 180 mg/dL   Results for CAMAYA, GANNETT (MRN [id number]) as of 02/08/2017 07:37  Ref. Range 02/07/2017 06:18 02/07/2017 11:59 02/07/2017 16:37 02/07/2017 21:48 02/08/2017 06:16  Glucose-Capillary Latest Ref Range: 65 - 99 mg/dL 236 (H) 205 (H) 196 (H) 368 (H) 213 (H)   Review of Glycemic Control  Outpatient Diabetes medications: Tresiba 20 units daily, Novolog 20 units BID Current orders for Inpatient glycemic control: Lantus 25 units daily, Novolog 3 units TID with meals, Novolog 0-9 units TID; Solumedrol 40 mg Q12H  Inpatient Diabetes Program Recommendations:  Correction (SSI): Please consider ordering Novolog 0-5 units QHS for bedtime correction. Insulin - Meal Coverage: If steroids are continued, please consider increasing meal coverage to Novolog 5 units TID. Diet: If appropriate, please consider discontinuing Regular diet and ordering Carb Modified diet.  Thanks, Barnie Alderman, RN, MSN, CDE Diabetes Coordinator Inpatient Diabetes Program [phone] (Team Pager from 8am to 5pm)

## 2017-02-08 NOTE — Progress Notes (Signed)
Daily Progress Note   Patient Name: [name]       Date: 02/08/2017 DOB: [birth year]  Age: 62 y.o. MRN#: [id number] Attending Physician: Kerney Elbe, DO Primary Care Physician: Brand Males, MD Admit Date: 02/02/2017  Reason for Consultation/Follow-up: Establishing goals of care and Non pain symptom management  Subjective: [name] is sitting up in bed. Waiting for her lunch as she's very hungry. Husband at bedside.   Length of Stay: 6  Current Medications: Scheduled Meds:  . atorvastatin  20 mg Oral Daily  . budesonide (PULMICORT) nebulizer solution  0.25 mg Nebulization BID  . gabapentin  100 mg Oral TID  . insulin aspart  0-9 Units Subcutaneous TID WC  . insulin aspart  3 Units Subcutaneous TID WC  . insulin glargine  25 Units Subcutaneous Daily  . ipratropium-albuterol  3 mL Nebulization TID  . methylPREDNISolone (SOLU-MEDROL) injection  40 mg Intravenous Q12H  . metoprolol tartrate  12.5 mg Oral BID  . polyethylene glycol  17 g Oral BID  . rivaroxaban  20 mg Oral Q supper  . senna-docusate  1 tablet Oral BID    Continuous Infusions:   PRN Meds: acetaminophen **OR** acetaminophen, albuterol, LORazepam, morphine injection, ondansetron **OR** ondansetron (ZOFRAN) IV  Physical Exam         Constitutional:  Frail  HENT:  Head: Normocephalic and atraumatic.  Neck: Normal range of motion.  Pulmonary/Chest: She has shallow breathes. Tachypneic at times, sats drop to 78% while eating.  No increased work of breathing Wearing her nasal cannula in her mouth which she states she prefers to do  Neurological: Alert, oriented mostly, seems confused at times, a little impulsive Nursing note and vitals reviewed.  Vital Signs: BP 108/65 (BP Location: Left Arm)    Pulse 91   Temp 98.7 F (37.1 C) (Oral)   Resp 18   Ht _0  (1.702 m)   Wt 73.2 kg (161 lb 6 oz)   SpO2 97%   BMI 25.28 kg/m  SpO2: SpO2: 97 % O2 Device: O2 Device: Nasal Cannula O2 Flow Rate: O2 Flow Rate (L/min): 2.5 L/min  Intake/output summary:   Intake/Output Summary (Last 24 hours) at 02/08/2017 1211 Last data filed at 02/08/2017 0812 Gross per 24 hour  Intake 240 ml  Output 1500 ml  Net -1260  ml   LBM: Last BM Date: 02/03/17 Baseline Weight: Weight: 72.5 kg (159 lb 13.3 oz) Most recent weight: Weight: 73.2 kg (161 lb 6 oz)       Palliative Assessment/Data: 30%   Flowsheet Rows     Most Recent Value  Intake Tab  Referral Department  Hospitalist  Unit at Time of Referral  Intermediate Care Unit  Palliative Care Primary Diagnosis  Pulmonary  Date Notified  02/03/17  Palliative Care Type  Return patient Palliative Care  Reason for referral  Clarify Goals of Care  Date of Admission  02/02/17  Date first seen by Palliative Care  02/04/17  # of days Palliative referral response time  1 Day(s)  # of days IP prior to Palliative referral  1  Clinical Assessment  Palliative Performance Scale Score  40%  Pain Max last 24 hours  Not able to report  Pain Min Last 24 hours  Not able to report  Dyspnea Max Last 24 Hours  Not able to report  Dyspnea Min Last 24 hours  Not able to report  Nausea Max Last 24 Hours  Not able to report  Nausea Min Last 24 Hours  Not able to report  Anxiety Max Last 24 Hours  Not able to report  Anxiety Min Last 24 Hours  Not able to report  Other Max Last 24 Hours  Not able to report  Psychosocial & Spiritual Assessment  Palliative Care Outcomes  Patient/Family meeting held?  Yes  Who was at the meeting?  pt  Palliative Care follow-up planned  Yes, Facility      Patient Active Problem List   Diagnosis Date Noted  . Pleural effusion, left 02/03/2017  . Pneumonia 02/03/2017  . CO2 retention   . Palliative care by specialist   . Acute  on chronic respiratory failure with hypoxia and hypercapnia ([place]) 09/13/2016  . Mass of left lung 09/13/2016  . Diabetes mellitus with complication ([place]) 08/65/7846  . COPD exacerbation ([place]) 09/13/2016  . Chronic diastolic heart failure ([place]) 09/13/2016  . Candidal dermatitis 09/13/2016  . Acute metabolic encephalopathy 96/29/5284  . Anemia, chronic disease 09/13/2016  . UTI (urinary tract infection) 09/13/2016  . Chronic diastolic CHF (congestive heart failure) ([place]) 06/26/2016  . Mass of upper lobe of left lung 06/26/2016  . Ventilator dependent ([place])   . Goals of care, counseling/discussion   . 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 Pressure injury of skin 12/04/2015  . Coronary artery disease without angina pectoris   . Morbid obesity ([place])   . Type 2 diabetes mellitus with complication, with long-term current use of insulin ([place]) 12/20/2012  . Atrial fibrillation ([place]) 12/20/2012  . Chronic respiratory failure ([place]) 12/20/2012  . HYPERLIPIDEMIA 03/11/2008  . Essential hypertension 03/11/2008  . CORONARY HEART DISEASE 03/11/2008    Palliative Care Assessment & Plan   HPI: 62 y.o. female  with past medical history of COPD on oxygen at home, coronary artery disease, history of MI, history of CVA, diabetes type 2, atrial fib, congestive heart failure, lung mass (found in October 2017; no outpatient workup) admitted on 02/02/2017 with altered mental status, dyspnea x 2 weeks with hypercarbia.  She was found to have a large left pleural effusion and underwent thoracentesis on 02/03/2017 for 1280 cc and have been awaiting cytology results. Pleural sample in August did not find any malignant cells. She has not completed her follow up appts as planned since August. Continues to decline with suspected lung cancer and end stage  COPD. Consult ordered for goals of care.    Assessment: I met today with [name] and her husband at bedside. We were joined by Dr. Pearline Cables. Dr. Pearline Cables explained cytology  results to them and even though no malignant cells resulted in sample this does not indicate that she does NOT have cancer. They were on board to repeat CT chest to have better information and assess for options for repeat biopsy. They agree with this plan.   I spoke further with patient and family and explained palliative care. We explored considering risks vs benefits of all procedures and treatment options. Husband explains how she has struggled to even get to her PCP for basic check up, medication refills, and for steroid burst. I am not sure how there would be any treatment for a cancer that she could tolerate with her advanced COPD. They also realize this but feel it is important to know more and know options. We also discussed potential need for PleurX drain for comfort and QOL.   I avoided hospice discussion today as they have been resistant in the past. Although we did discuss the differences between palliative vs hospice services.   Recommendations/Plan:  Dyspnea: Continue targeted pulmonary treatments, steroids as well as as needed morphine for severe dyspnea  Pain: Continue gabapentin, as needed morphine  Consider PleurX drain placement as needed for comfort measure.    Code Status:  DNR  Prognosis:   Difficult to determine but I do believe she is eligible for hospice at home (< 6 months likely) given end stage COPD complicated by lung malignancy.   Discharge Planning:  To Be Determined   Thank you for allowing the Palliative Medicine Team to assist in the care of this patient.   Total Time 35 min Prolonged Time Billed  no       Greater than 50%  of this time was spent counseling and coordinating care related to the above assessment and plan.  Vinie Sill, NP Palliative Medicine Team Pager # [phone] (M-F 8a-5p) Team Phone # [phone] (Nights/Weekends)

## 2017-02-08 NOTE — Progress Notes (Signed)
Patient refused BIPAP and treatments at this time.

## 2017-02-08 NOTE — Progress Notes (Signed)
PULMONARY / CRITICAL CARE MEDICINE   Name: [name] MRN: [id number] DOB: 04-19-1955    ADMISSION DATE:  02/02/2017  HISTORY OF PRESENT ILLNESS:   [name] a 62 y.o.female former smoker with a 120 pack year smoking history ( Quit 2011)   withhistory of COPD, diabetes mellitus type 2, embolic CVA, CHF and known history of lung mass ( Discovered 11/2015 during hospital admission)  was brought to the ER the patient was found to be increasingly confused by patient's husband. As per the husband patient has not been ambulatory as usual which is progressively worse last 2 weeks. Patient also has been increasingly short of breath. Denied chest pain productive cough fever chills or any loss of consciousness nausea vomiting or diarrhea upon admission. CTA chest was done which was negative for PE but was positive for Diffuse attenuation and narrowing of left-sided pulmonary vessels due to known pulmonary  mass and consolidative process, large left pleural effusion,possible pneumonia and increased AP adenopathy and left adrenal gland measuring 11 mm. Pt's dyspnea did not improve, and she underwent a therapeutic/diagnostic thoracentesis 12/29 with removal of 1280 cc's of thin serosanguinous fluid . Fluid was sent for evaluation. Previous Thora done 09/2016 did not reveal malignant cells. CCM were asked to consult for lung mass and pleural effusion, COPD exacerbation and  Suspected pneumonia. ABG 12/29  revealed CO2 of 101. Bi Pap was initiated with improvement in mental status.  Per husband, patient is non-compliant with her pulmonary nebulizer treatments. She uses her Pro Air and Combivent inhalers " Way too much" Additionally she does not maintain good follow up regarding medical appointments. Husband states she cancels appointments frequently.     SUBJECTIVE: She appears anxious and is obviously confused.  VITAL SIGNS: BP 108/65 (BP Location: Left Arm)   Pulse 91   Temp 98.7 F (37.1 C)  (Oral)   Resp 18   Ht 5\' 7"  (1.702 m)   Wt 73.2 kg (161 lb 6 oz)   SpO2 97%   BMI 25.28 kg/m     INTAKE / OUTPUT: I/O last 3 completed shifts: In: 360 [P.O.:360] Out: 1500 [Urine:1500]  PHYSICAL EXAMINATION: General: Obese, frail, confused female.  Appears much older than her stated age. HEENT: Edentulous, oxygen in place PSY: Confused and anxious Neuro: Moves all extremities x4 CV: Sinus rhythm with a rate of 90, blood pressure 108/65 PULM: Shallow respirations, nasal cannula 2.5 L/min with saturations of 97% GI: Obese, soft positive bowel Extremities: 1+ lower extremity edema Skin: no rashes or lesions   LABS:  BMET Recent Labs  Lab 02/05/17 0254 02/07/17 0358 02/08/17 0317  NA 134* 136 132*  K 3.8 4.9 4.9  CL 74* 78* 76*  CO2 49* >50* 48*  BUN 12 16 14   CREATININE 0.76 0.90 0.66  GLUCOSE 259* 292* 247*    Electrolytes Recent Labs  Lab 02/05/17 0254 02/07/17 0358 02/08/17 0317  CALCIUM 9.2 9.0 8.6*    CBC Recent Labs  Lab 02/05/17 0254 02/07/17 0358 02/08/17 0317  WBC 12.2* 8.3 9.1  HGB 7.6* 7.9* 7.8*  HCT 28.4* 30.8* 30.5*  PLT 281 238 214    Coag's No results for input(s): APTT, INR in the last 168 hours.  Sepsis Markers Recent Labs  Lab 02/02/17 1737  LATICACIDVEN 1.14    ABG Recent Labs  Lab 02/03/17 1810  PHART 7.339*  PCO2ART 101*  PO2ART 110*    Liver Enzymes Recent Labs  Lab 02/02/17 1724  AST 12*  ALT 9*  ALKPHOS 78  BILITOT 0.8  ALBUMIN 2.1*    Cardiac Enzymes No results for input(s): TROPONINI, PROBNP in the last 168 hours.  Glucose Recent Labs  Lab 02/06/17 2118 02/07/17 0618 02/07/17 1159 02/07/17 1637 02/07/17 2148 02/08/17 0616  GLUCAP 313* 236* 205* 196* 368* 213*    Imaging No results found.   STUDIES:  12/29 Thora>> Left pleural fluid cytology >> no malignant cells identified  ANTIBIOTICS: 02/05/2017>>Ceftin>>    DISCUSSION: This is a 62 year old who at baseline has  significant COPD.  She presented with altered mental status and hypercarbia.  She was found to have a large left pleural effusion which has been drained.  In addition she has a known left-sided mass, which is not been definitively diagnosed.  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  Patient has significant baseline COPD; therefore, even should she prove to stage appropriately, she is likely not a candidate for resection. She had left thora 12/29 with 1280cc of fluid removed, exudate and cytology no malignant cells identified.  Unfortunately her repeat CXR 1/2 shows progression / recurrence of effusion. Hypercapnic respiratory failure - started on PRN BiPAP. Plan: Lasix as needed. Abx per primary team. pleural fluid cytology with no malignant cells identified on 02/03/2017 Continue BiPAP QHS and prn. Titrate Oxygen to maintain saturations 88-92% ( 5 L Home oxygen). Continue scheduled BD's  Pulmicort and DuoNebs. DNR established - fully agree. Questionable if she needs further evaluation for malignancy although most likely she would not survive any type of chemo or surgical interventions. She is a DNR at this time.  Further discussions with her, her husband and with pulmonology and primary care team in the near future.   Rest per primary team.   Montey Hora, [place] [facility] Pager: [phone]  or [phone] 02/08/2017, 8:53 AM

## 2017-02-09 ENCOUNTER — Inpatient Hospital Stay (HOSPITAL_COMMUNITY): Payer: BLUE CROSS/BLUE SHIELD

## 2017-02-09 DIAGNOSIS — J9602 Acute respiratory failure with hypercapnia: Secondary | ICD-10-CM

## 2017-02-09 DIAGNOSIS — C3412 Malignant neoplasm of upper lobe, left bronchus or lung: Secondary | ICD-10-CM

## 2017-02-09 LAB — CBC WITH DIFFERENTIAL/PLATELET
Basophils Absolute: 0 10*3/uL (ref 0.0–0.1)
Basophils Relative: 0 %
Eosinophils Absolute: 0 10*3/uL (ref 0.0–0.7)
Eosinophils Relative: 0 %
HCT: 31.5 % — ABNORMAL LOW (ref 36.0–46.0)
HEMOGLOBIN: 8.2 g/dL — AB (ref 12.0–15.0)
LYMPHS ABS: 0.6 10*3/uL — AB (ref 0.7–4.0)
LYMPHS PCT: 6 %
MCH: 24.1 pg — AB (ref 26.0–34.0)
MCHC: 26 g/dL — AB (ref 30.0–36.0)
MCV: 92.6 fL (ref 78.0–100.0)
MONOS PCT: 3 %
Monocytes Absolute: 0.3 10*3/uL (ref 0.1–1.0)
NEUTROS PCT: 91 %
Neutro Abs: 9.2 10*3/uL — ABNORMAL HIGH (ref 1.7–7.7)
Platelets: 234 10*3/uL (ref 150–400)
RBC: 3.4 MIL/uL — AB (ref 3.87–5.11)
RDW: 14.3 % (ref 11.5–15.5)
WBC: 10.1 10*3/uL (ref 4.0–10.5)

## 2017-02-09 LAB — GLUCOSE, CAPILLARY
GLUCOSE-CAPILLARY: 229 mg/dL — AB (ref 65–99)
GLUCOSE-CAPILLARY: 248 mg/dL — AB (ref 65–99)
Glucose-Capillary: 215 mg/dL — ABNORMAL HIGH (ref 65–99)
Glucose-Capillary: 101 mg/dL — ABNORMAL HIGH (ref 65–99)

## 2017-02-09 LAB — COMPREHENSIVE METABOLIC PANEL
ALT: 17 U/L (ref 14–54)
ANION GAP: 8 (ref 5–15)
AST: 17 U/L (ref 15–41)
Albumin: 2 g/dL — ABNORMAL LOW (ref 3.5–5.0)
Alkaline Phosphatase: 70 U/L (ref 38–126)
BUN: 18 mg/dL (ref 6–20)
CALCIUM: 8.8 mg/dL — AB (ref 8.9–10.3)
CO2: 47 mmol/L — AB (ref 22–32)
CREATININE: 0.65 mg/dL (ref 0.44–1.00)
Chloride: 79 mmol/L — ABNORMAL LOW (ref 101–111)
GFR calc non Af Amer: >60 mL/min (ref >60)
Glucose, Bld: 272 mg/dL — ABNORMAL HIGH (ref 65–99)
Potassium: 4.6 mmol/L (ref 3.5–5.1)
SODIUM: 134 mmol/L — AB (ref 135–145)
Total Bilirubin: 0.3 mg/dL (ref 0.3–1.2)
Total Protein: 6.5 g/dL (ref 6.5–8.1)

## 2017-02-09 LAB — PHOSPHORUS: PHOSPHORUS: 3.1 mg/dL (ref 2.5–4.6)

## 2017-02-09 LAB — MAGNESIUM: Magnesium: 2 mg/dL (ref 1.7–2.4)

## 2017-02-09 MED ORDER — INSULIN ASPART 100 UNIT/ML ~~LOC~~ SOLN
0.0000 [IU] | Freq: Every day | SUBCUTANEOUS | Status: DC
  Administered 2017-02-09: 2 [IU] via SUBCUTANEOUS

## 2017-02-09 MED ORDER — INSULIN ASPART 100 UNIT/ML ~~LOC~~ SOLN
5.0000 [IU] | Freq: Three times a day (TID) | SUBCUTANEOUS | Status: DC
  Administered 2017-02-09 – 2017-02-10 (×4): 5 [IU] via SUBCUTANEOUS

## 2017-02-09 NOTE — Progress Notes (Signed)
Inpatient Diabetes Program Recommendations  AACE/ADA: New Consensus Statement on Inpatient Glycemic Control (2015)  Target Ranges:  Prepandial:   less than 140 mg/dL      Peak postprandial:   less than 180 mg/dL (1-2 hours)      Critically ill patients:  140 - 180 mg/dL   Results for ALANY, BORMAN (MRN [id number]) as of 02/09/2017 07:37  Ref. Range 02/08/2017 06:16 02/08/2017 11:35 02/08/2017 16:19 02/08/2017 20:44 02/09/2017 06:04  Glucose-Capillary Latest Ref Range: 65 - 99 mg/dL 213 (H)  Novolog 6 units  Lantus 25 units 192 (H)  Novolog 5 units 310 (H)  Novolog 10 units 153 (H) 215 (H)  Novolog 3 units    Review of Glycemic Control  Outpatient Diabetes medications:Tresiba 20 units daily, Novolog 20 units BID Current orders for Inpatient glycemic control:Lantus 25 units daily, Novolog 3 units TID with meals, Novolog 0-9 units TID; Solumedrol 40 mg Q12H  Inpatient Diabetes Program Recommendations: Correction (SSI): Please consider ordering Novolog 0-5 units QHS for bedtime correction. Insulin - Meal Coverage:If steroids are continued, please consider increasing meal coverage to Novolog 5 units TID. Diet: If appropriate, please consider discontinuing Regular diet and ordering Carb Modified diet.  Thanks, Barnie Alderman, RN, MSN, CDE Diabetes Coordinator Inpatient Diabetes Program [phone] (Team Pager from 8am to 5pm)

## 2017-02-09 NOTE — Progress Notes (Signed)
PROGRESS NOTE    [name]  ZSW:[id number] DOB: 11-24-1955 DOA: 02/02/2017 PCP: Brand Males, MD  Brief Narrative: [name] is a 62 y.o. female with history of COPD, diabetes mellitus type 2, embolic CVA, CHF and known history of lung mass was brought to the ER the patient was found to be increasingly confused by patient's husband.  As per the husband patient has not been ambulatory as usual which is progressively worse last 2 weeks.  Patient also has been increasingly short of breath.  Denies any chest pain productive cough fever chills or any loss of consciousness nausea vomiting or diarrhea.  In the ER patient is found to be hypoxic and short of breath. Oriented to her name is able to recognize her husband and follows commands.  Chest x-ray shows left-sided opacification concerning for pleural effusion and pneumonia. UA shows features concerning for UTI.  CT head was unremarkable and CT angiogram of the chest was negative for pulmonary embolism but did show large left pleural effusion with possible consolidation.  Lung mass was also seen. She underwent thoracentesis with re-accumulation of Fluid quite quickly. Pulmonary consulted and working mass up and today they recommended that biopsy would be high risk and have recommended Palliative Care as patient has refused BiPAP and Pluer-X Catheter. Brought up the discussion about possible hospice and husband is agreeable but will have Palliative Involvement.   Assessment & Plan:   Principal Problem:   Acute on chronic respiratory failure with hypoxia and hypercapnia (HCC) Active Problems:   Type 2 diabetes mellitus with complication, with long-term current use of insulin (HCC)   Chronic respiratory failure (HCC)   Chronic diastolic CHF (congestive heart failure) (HCC)   Mass of left lung   Diabetes mellitus with complication (HCC)   COPD exacerbation (HCC)   Chronic diastolic heart failure (HCC)   Acute metabolic encephalopathy  Pleural effusion, left   Pneumonia   Palliative care encounter  Acute on chronic hypoxic, hypercapnic resp failure secondary to large left pleural effusion, known left lung mass, suspected pneumonia, COPD exacerbation.  -S/p large volume thoracentesis 12/29, fluids culture no growth , cytology negative for Malignancy - She refused BiPAP at night intermittently and continues to refuse; husband reports bipap can be put on her only when she is drowsy and confused. - She was treated with vanc/zosyn since admission, pleural fluids culture negative, no fever, IV Abx D/C'd. -Continue steroids (but decrease freq), bronchodilators -Pulmonary ordered CT of Chest to find best approach for tissue diagnosis to help plan palliation but now recommending full palliation and Comfort Care given patient's denial and refusal to have workup -CT Chest w/o Contrast showed Large left upper lobe lung mass is again demonstrated with probable endobronchial spread of tumor. Moderate to large left pleural effusion with overlying atelectasis. Stable prevascular and aorticopulmonary window lymph nodes. Advanced emphysematous changes and areas pulmonary scarring. No obvious metastatic pulmonary nodules. No findings suggest upper abdominal metastatic disease -Will have Palliative discuss about Home Hospice vs. Residential Hospice.    Left lung mass.  -Noted on office visit 04/2016, hospitalization 06/2016 and 09/2016 but patient put off follow-up. Husband reports patient never recuperate enough to make it to follow up appointment. -F/u cytology Negative,  -Chest CT w/o Contrast showed Large left upper lobe lung mass is again demonstrated with probable endobronchial spread of tumor. Moderate to large left pleural effusion with overlying atelectasis. Stable prevascular and aorticopulmonary window lymph nodes. Advanced emphysematous changes and areas pulmonary scarring. No obvious metastatic pulmonary  nodules. No findings suggest  upper abdominal metastatic disease -Appreciate PMT involvement; Pulmonary recommending Hospice and full palliation at this Point   COPD, Chronic Hypoxic Respiratory Failure on home oxygen 5 L -Continue tx as above -Has CO2 retention, she refuses night time bipap and refused it again this AM   E coli UTI -she was on zosyn then Ceftin, finished total of 5 day treatment as of 1/2.  Acute Encephalopathy -Secondary to above -Continue treatments as above  -she is more somnolent but awakes and refuses her care   Chronic diastolic CHF -No lower extremity edema -Appears compensated  -C/w Strict Is/O's; Patient is -2.9 Liters; Weight is down 2 lbs since admission  -Lasix daily as needed  HTN; bp borderline -Continue Betablocker, Lasix, hold Imdur  DM type 2 on Insulin -HbA1c was 6.4 -CBG high, partly due to on steroids -Continue with Lantus 25 units daily, Novolog 5 units TID, and Sensitive Novolog SSI AC/HS -CBG's ranging from 101-215  PMH CVA on Xarelto -Continue Xarelto 20 mg  and Atorvastatin 20 mg po qHS  Aortic atherosclerosis -Continue atorvastatin as above  Normocytic Anemia/Anemia of Chronic Disease -Hgb/Hct stable at 8.2/31.5; no bleeding at this point -Continue to Monitor for S/Sx of Bleeding as patient is Anticoagulated with Rivaroxaban  -no indication for transfusion -Repeat CBC in AM  Anxiety  -Appears Stable -C/w Lorazepam 0.5 mg IV q4hprn  DVT prophylaxis: Anticoagulated with Rivaroxaban Code Status: DO NOT RESUSCITATE Family Communication: Husband at bedside  Disposition Plan: Remain Inpatient for further workup  Consultants:   Pulmonary  Palliative Care Medicine     Procedures:   12/29 US guided left thoracentesis, ~1280cc of thin serosanguinous fluid aspirated   Antimicrobials:  Anti-infectives (From admission, onward)   Start     Dose/Rate Route Frequency Ordered Stop   02/05/17 1200  amoxicillin-clavulanate (AUGMENTIN) 875-125 MG  per tablet 1 tablet  Status:  Discontinued     1 tablet Oral Every 12 hours 02/05/17 1033 02/05/17 1042   02/05/17 1200  cefUROXime (CEFTIN) tablet 500 mg  Status:  Discontinued     500 mg Oral 2 times daily with meals 02/05/17 1042 02/07/17 1712   02/03/17 2000  vancomycin (VANCOCIN) IVPB 1000 mg/200 mL premix  Status:  Discontinued     1,000 mg 200 mL/hr over 60 Minutes Intravenous Every 12 hours 02/03/17 0703 02/05/17 1033   02/03/17 1400  piperacillin-tazobactam (ZOSYN) IVPB 3.375 g  Status:  Discontinued     3.375 g 12.5 mL/hr over 240 Minutes Intravenous Every 8 hours 02/03/17 0703 02/05/17 1033   02/03/17 0715  vancomycin (VANCOCIN) 1,500 mg in sodium chloride 0.9 % 500 mL IVPB     1,500 mg 250 mL/hr over 120 Minutes Intravenous  Once 02/03/17 0703 02/03/17 1011   02/03/17 0715  piperacillin-tazobactam (ZOSYN) IVPB 3.375 g     3.375 g 100 mL/hr over 30 Minutes Intravenous  Once 02/03/17 0703 02/03/17 0845   02/02/17 2000  cefTRIAXone (ROCEPHIN) 1 g in dextrose 5 % 50 mL IVPB     1 g 100 mL/hr over 30 Minutes Intravenous  Once 02/02/17 1955 02/02/17 2128     Subjective: Seen and examined at bedside and she was more somnolent and more difficult to arouse but she awoke and made it very clear she does not want BiPAP. No CP or SOB still complaining of some mild abdominal Pain. Does not feel any better.    Objective: Vitals:   02/09/17 0000 02/09/17 0400 02/09/17 0421 02/09/17 5573  BP: 119/68 (!) 106/59 112/66 129/63  Pulse: 87 70 69 77  Resp: 20 15 14 16   Temp:   99 F (37.2 C) 97.9 F (36.6 C)  TempSrc:   Axillary Axillary  SpO2: 98% 100% 99% 99%  Weight:   71.3 kg (157 lb 3 oz)   Height:        Intake/Output Summary (Last 24 hours) at 02/09/2017 0809 Last data filed at 02/09/2017 0450 Gross per 24 hour  Intake 960 ml  Output 550 ml  Net 410 ml   Filed Weights   02/06/17 0400 02/08/17 0349 02/09/17 0421  Weight: 71 kg (156 lb 8.4 oz) 73.2 kg (161 lb 6 oz) 71.3 kg (157  lb 3 oz)   Examination: Physical Exam:  Constitutional: WN/WD Caucasian female in NAD appears more somnolent today Eyes: Sclerae anicteric. Lids Normal ENMT: External Ears and nose appear normal Neck: Supple with no JVD Respiratory: Diminished to auscultation especially on the Left. Wearing supplemental O2 via [place]. Cardiovascular: RRR; No appreciable m/r/g. No LE Edema Abdomen: Soft, Slightly tender to palpate. Bowel sounds present GU: Deferred Musculoskeletal: No contractures; No cyanosis Skin: Warm and dry. No rashes appreciated on a limited skin eval. Has a facial lesion Neurologic: CN 2-12 grossly intact. No appreciable focal deficits Psychiatric: Normal mood and affect. Intact judgement and insight. Somnolent but arousable  Data Reviewed: I have personally reviewed following labs and imaging studies  CBC: Recent Labs  Lab 02/02/17 1724  02/04/17 0251 02/05/17 0254 02/07/17 0358 02/08/17 0317 02/09/17 0307  WBC 10.6*   < > 7.4 12.2* 8.3 9.1 10.1  NEUTROABS 8.8*  --   --   --   --   --  9.2*  HGB 8.3*   < > 7.8* 7.6* 7.9* 7.8* 8.2*  HCT 32.2*   < > 29.7* 28.4* 30.8* 30.5* 31.5*  MCV 95.8   < > 95.5 93.1 96.3 93.3 92.6  PLT 311   < > 275 281 238 214 234   < > = values in this interval not displayed.   Basic Metabolic Panel: Recent Labs  Lab 02/04/17 0251 02/05/17 0254 02/07/17 0358 02/08/17 0317 02/09/17 0307  NA 135 134* 136 132* 134*  K 4.1 3.8 4.9 4.9 4.6  CL 76* 74* 78* 76* 79*  CO2 44* 49* >50* 48* 47*  GLUCOSE 201* 259* 292* 247* 272*  BUN 10 12 16 14 18   CREATININE 0.80 0.76 0.90 0.66 0.65  CALCIUM 9.1 9.2 9.0 8.6* 8.8*  MG  --   --   --   --  2.0  PHOS  --   --   --   --  3.1   GFR: Estimated Creatinine Clearance: 70 mL/min (by C-G formula based on SCr of 0.65 mg/dL). Liver Function Tests: Recent Labs  Lab 02/02/17 1724 02/09/17 0307  AST 12* 17  ALT 9* 17  ALKPHOS 78 70  BILITOT 0.8 0.3  PROT 7.6 6.5  ALBUMIN 2.1* 2.0*   No results for  input(s): LIPASE, AMYLASE in the last 168 hours. No results for input(s): AMMONIA in the last 168 hours. Coagulation Profile: No results for input(s): INR, PROTIME in the last 168 hours. Cardiac Enzymes: No results for input(s): CKTOTAL, CKMB, CKMBINDEX, TROPONINI in the last 168 hours. BNP (last 3 results) No results for input(s): PROBNP in the last 8760 hours. HbA1C: Recent Labs    02/08/17 0317  HGBA1C 6.4*   CBG: Recent Labs  Lab 02/08/17 0616 02/08/17 1135 02/08/17 1619  02/08/17 2044 02/09/17 0604  GLUCAP 213* 192* 310* 153* 215*   Lipid Profile: No results for input(s): CHOL, HDL, LDLCALC, TRIG, CHOLHDL, LDLDIRECT in the last 72 hours. Thyroid Function Tests: Recent Labs    02/08/17 0317  TSH 0.888   Anemia Panel: No results for input(s): VITAMINB12, FOLATE, FERRITIN, TIBC, IRON, RETICCTPCT in the last 72 hours. Sepsis Labs: Recent Labs  Lab 02/02/17 1737  LATICACIDVEN 1.14    Recent Results (from the past 240 hour(s))  Urine culture     Status: Abnormal   Collection Time: 02/02/17  7:56 PM  Result Value Ref Range Status   Specimen Description URINE, RANDOM  Final   Special Requests NONE  Final   Culture >=100,000 COLONIES/mL ESCHERICHIA COLI (A)  Final   Report Status 02/04/2017 FINAL  Final   Organism ID, Bacteria ESCHERICHIA COLI (A)  Final      Susceptibility   Escherichia coli - MIC*    AMPICILLIN >=32 RESISTANT Resistant     CEFAZOLIN <=4 SENSITIVE Sensitive     CEFTRIAXONE <=1 SENSITIVE Sensitive     CIPROFLOXACIN <=0.25 SENSITIVE Sensitive     GENTAMICIN <=1 SENSITIVE Sensitive     IMIPENEM <=0.25 SENSITIVE Sensitive     NITROFURANTOIN <=16 SENSITIVE Sensitive     TRIMETH/SULFA <=20 SENSITIVE Sensitive     AMPICILLIN/SULBACTAM >=32 RESISTANT Resistant     PIP/TAZO <=4 SENSITIVE Sensitive     Extended ESBL NEGATIVE Sensitive     * >=100,000 COLONIES/mL ESCHERICHIA COLI  Culture, body fluid-bottle     Status: None   Collection Time:  02/03/17  2:53 PM  Result Value Ref Range Status   Specimen Description PLEURAL LEFT  Final   Special Requests NONE  Final   Culture NO GROWTH 5 DAYS  Final   Report Status 02/08/2017 FINAL  Final  Gram stain     Status: None   Collection Time: 02/03/17  2:53 PM  Result Value Ref Range Status   Specimen Description PLEURAL LEFT  Final   Special Requests NONE  Final   Gram Stain NO WBC SEEN NO ORGANISMS SEEN   Final   Report Status 02/03/2017 FINAL  Final    Radiology Studies: Ct Chest Wo Contrast  Result Date: 02/08/2017 CLINICAL DATA:  Confusion.  Known left upper lobe lung mass. EXAM: CT CHEST WITHOUT CONTRAST TECHNIQUE: Multidetector CT imaging of the chest was performed following the standard protocol without IV contrast. COMPARISON:  Chest CT 02/02/2017 FINDINGS: Cardiovascular: The heart is normal in size. No pericardial effusion. Stable tortuosity, ectasia and dense calcification of the thoracic aorta and branch vessels including the coronary arteries. Mediastinum/Nodes: Stable prevascular and aorticopulmonary window lymph nodes. The esophagus is grossly normal. Lungs/Pleura: Severe emphysematous changes with areas of pulmonary scarring. Large left upper lobe lung mass is again demonstrated which appears to have tortuous branching tumor extending from it. This is likely endobronchial spread of tumor in the left upper lobe. There is an associated moderate to large left pleural effusion with overlying atelectasis. No pulmonary nodules in the right lung suggest metastatic disease. Upper Abdomen: No obvious hepatic metastatic disease. No adrenal gland metastasis. A right renal calculus is noted. Advanced atherosclerotic calcifications involving the upper abdominal aorta and branch vessels. Cholelithiasis. Musculoskeletal: No breast masses, supraclavicular or axillary lymphadenopathy. 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 No worrisome bone lesions to suggest osseous metastatic disease.  IMPRESSION: 1. Large left upper lobe lung mass is again demonstrated with probable endobronchial spread of tumor. 2. Moderate to large  left pleural effusion with overlying atelectasis. 3. Stable prevascular and aorticopulmonary window lymph nodes. 4. Advanced emphysematous changes and areas pulmonary scarring. No obvious metastatic pulmonary nodules. 5. No findings suggest upper abdominal metastatic disease. Aortic Atherosclerosis (ICD10-I70.0) and Emphysema (ICD10-J43.9). Electronically Signed   By: Marijo Sanes [name]   On: 02/08/2017 15:46   Dg Chest Port 1 View  Result Date: 02/08/2017 CLINICAL DATA:  Shortness of breath. EXAM: PORTABLE CHEST 1 VIEW COMPARISON:  02/07/2017 FINDINGS: Stable large left lung mass. Mild stable cardiac enlargement with left lower lobe process likely a combination of effusion and atelectasis. The right lung remains relatively clear. The bony thorax is intact. IMPRESSION: Stable large left upper lobe lung mass and persistent left lower lobe process likely a combination of effusion and atelectasis. The right lung remains clear. Stable underlying emphysematous changes. Electronically Signed   By: Marijo Sanes [name]   On: 02/08/2017 10:28   Dg Abd Portable 1v  Result Date: 02/08/2017 CLINICAL DATA:  Abdominal pain. EXAM: PORTABLE ABDOMEN - 1 VIEW COMPARISON:  None. FINDINGS: Large amount of stool throughout the colon and down into the rectum suggesting constipation. No findings for small bowel obstruction or free air. Persistent left lower lobe process is noted. The bony structures are grossly intact. IMPRESSION: Large amount of stool throughout the colon suggesting constipation. Electronically Signed   By: Marijo Sanes [name]   On: 02/08/2017 10:30   Scheduled Meds: . atorvastatin  20 mg Oral Daily  . budesonide (PULMICORT) nebulizer solution  0.25 mg Nebulization BID  . gabapentin  100 mg Oral TID  . insulin aspart  0-5 Units Subcutaneous QHS  . insulin aspart  0-9 Units  Subcutaneous TID WC  . insulin aspart  5 Units Subcutaneous TID WC  . insulin glargine  25 Units Subcutaneous Daily  . ipratropium-albuterol  3 mL Nebulization TID  . methylPREDNISolone (SOLU-MEDROL) injection  40 mg Intravenous Q12H  . metoprolol tartrate  12.5 mg Oral BID  . polyethylene glycol  17 g Oral BID  . rivaroxaban  20 mg Oral Q supper  . senna-docusate  1 tablet Oral BID   Continuous Infusions:   LOS: 7 days   Kerney Elbe, DO [facility] Pager [phone]  If 7PM-7AM, please contact night-coverage [email or web address] Password [email or web address] 02/09/2017, 8:09 AM

## 2017-02-09 NOTE — Progress Notes (Signed)
Daily Progress Note   Patient Name: [name]       Date: 02/09/2017 DOB: 09/13/1955  Age: 62 y.o. MRN#: [id number] Attending Physician: Kerney Elbe, DO Primary Care Physician: Brand Males, MD Admit Date: 02/02/2017  Reason for Consultation/Follow-up: Establishing goals of care and Non pain symptom management  Subjective: [name] is more sleepy today. Difficult for her to participate in our conversation. Husband at bedside.   Length of Stay: 7  Current Medications: Scheduled Meds:  . atorvastatin  20 mg Oral Daily  . budesonide (PULMICORT) nebulizer solution  0.25 mg Nebulization BID  . gabapentin  100 mg Oral TID  . insulin aspart  0-5 Units Subcutaneous QHS  . insulin aspart  0-9 Units Subcutaneous TID WC  . insulin aspart  5 Units Subcutaneous TID WC  . insulin glargine  25 Units Subcutaneous Daily  . ipratropium-albuterol  3 mL Nebulization TID  . methylPREDNISolone (SOLU-MEDROL) injection  40 mg Intravenous Q12H  . metoprolol tartrate  12.5 mg Oral BID  . polyethylene glycol  17 g Oral BID  . rivaroxaban  20 mg Oral Q supper  . senna-docusate  1 tablet Oral BID    Continuous Infusions:   PRN Meds: acetaminophen **OR** acetaminophen, albuterol, LORazepam, morphine injection, ondansetron **OR** ondansetron (ZOFRAN) IV  Physical Exam         Constitutional:  Frail  HENT:  Head: Normocephalic and atraumatic.  Neck: Normal range of motion.  Pulmonary/Chest: She has shallow breathes. Tachypneic at times, sats drop to 58% while eating.  No increased work of breathing Wearing her nasal cannula in her mouth which she states she prefers to do  Neurological: Less oriented today, more lethargic and confused.  Nursing note and vitals reviewed.  Vital  Signs: BP (!) 110/55 (BP Location: Right Wrist)   Pulse 70   Temp 98.4 F (36.9 C) (Axillary)   Resp 16   Ht 5' 7"  (1.702 m)   Wt 71.3 kg (157 lb 3 oz)   SpO2 92%   BMI 24.62 kg/m  SpO2: SpO2: 92 % O2 Device: O2 Device: Nasal Cannula O2 Flow Rate: O2 Flow Rate (L/min): 2 L/min  Intake/output summary:   Intake/Output Summary (Last 24 hours) at 02/09/2017 1403 Last data filed at 02/09/2017 1250 Gross per 24 hour  Intake 720 ml  Output 1050 ml  Net -330 ml   LBM: Last BM Date: 02/08/17 Baseline Weight: Weight: 72.5 kg (159 lb 13.3 oz) Most recent weight: Weight: 71.3 kg (157 lb 3 oz)       Palliative Assessment/Data: 30%   Flowsheet Rows     Most Recent Value  Intake Tab  Referral Department  Hospitalist  Unit at Time of Referral  Intermediate Care Unit  Palliative Care Primary Diagnosis  Pulmonary  Date Notified  02/03/17  Palliative Care Type  Return patient Palliative Care  Reason for referral  Clarify Goals of Care  Date of Admission  02/02/17  Date first seen by Palliative Care  02/04/17  # of days Palliative referral response time  1 Day(s)  # of days IP prior to Palliative referral  1  Clinical Assessment  Palliative Performance Scale Score  40%  Pain Max last 24 hours  Not able to report  Pain Min Last 24 hours  Not able to report  Dyspnea Max Last 24 Hours  Not able to report  Dyspnea Min Last 24 hours  Not able to report  Nausea Max Last 24 Hours  Not able to report  Nausea Min Last 24 Hours  Not able to report  Anxiety Max Last 24 Hours  Not able to report  Anxiety Min Last 24 Hours  Not able to report  Other Max Last 24 Hours  Not able to report  Psychosocial & Spiritual Assessment  Palliative Care Outcomes  Patient/Family meeting held?  Yes  Who was at the meeting?  pt  Palliative Care follow-up planned  Yes, Facility      Patient Active Problem List   Diagnosis Date Noted  . Malignant neoplasm of upper lobe of left lung ([place])   . Palliative  care encounter   . Pleural effusion, left 02/03/2017  . Pneumonia 02/03/2017  . CO2 retention   . Palliative care by specialist   . Acute on chronic respiratory failure with hypoxia and hypercapnia ([place]) 09/13/2016  . Mass of left lung 09/13/2016  . Diabetes mellitus with complication ([place]) 34/19/6222  . COPD exacerbation ([place]) 09/13/2016  . Chronic diastolic heart failure ([place]) 09/13/2016  . Candidal dermatitis 09/13/2016  . Acute metabolic encephalopathy 97/98/9211  . Anemia, chronic disease 09/13/2016  . UTI (urinary tract infection) 09/13/2016  . Chronic diastolic CHF (congestive heart failure) ([place]) 06/26/2016  . Mass of upper lobe of left lung 06/26/2016  . Ventilator dependent ([place])   . Goals of care, counseling/discussion   . 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 Pressure injury of skin 12/04/2015  . Coronary artery disease without angina pectoris   . Morbid obesity ([place])   . Type 2 diabetes mellitus with complication, with long-term current use of insulin ([place]) 12/20/2012  . Atrial fibrillation ([place]) 12/20/2012  . Chronic respiratory failure ([place]) 12/20/2012  . HYPERLIPIDEMIA 03/11/2008  . Essential hypertension 03/11/2008  . CORONARY HEART DISEASE 03/11/2008    Palliative Care Assessment & Plan   HPI: 62 y.o. female  with past medical history of COPD on oxygen at home, coronary artery disease, history of MI, history of CVA, diabetes type 2, atrial fib, congestive heart failure, lung mass (found in October 2017; no outpatient workup) admitted on 02/02/2017 with altered mental status, dyspnea x 2 weeks with hypercarbia.  She was found to have a large left pleural effusion and underwent thoracentesis on 02/03/2017 for 1280 cc and have been awaiting cytology results. Pleural sample in August did not find any  malignant cells. She has not completed her follow up appts as planned since August. Continues to decline with suspected lung cancer and end stage COPD. Consult ordered for goals  of care.    Assessment: I met today with [name] again. [name] is able to talk to me more about hospice. He seems more open but has many questions and concerns. [name] is in and out of our conversation. When I mention hospice again at the end of our conversation she seems surprised again.   Discussed in detail the benefits of hospice support at home. Also reassured that the never lose control over decision making and hospice is there as a support in these decisions. However, if a decision is made for care not in line with hospice there could be a point where hospice will not continue to be involved in her care. I believe they felt that hospice would be dictating her plan of care and that they would not be involved in these decisions.   Mr. Hanford plans to speak more with his wife (if he is able) and their children regarding hospice support. He seems to agree that the support they provide would be very helpful to them. Hard Choices booklet also given. Ms. Cafaro refuses PleurX drain and BiPAP. Palliative will follow up to continue hospice discussion.   Recommendations/Plan:  Dyspnea: Continue targeted pulmonary treatments, steroids as well as as needed morphine for severe dyspnea (this should be managed by hospice at home)  Pain: Continue gabapentin, as needed morphine  Consider PleurX drain placement as needed for comfort measure.    Code Status:  DNR  Prognosis:   Hospice at home (< 6 months) given end stage COPD complicated by lung malignancy.   Discharge Planning:  To Be Determined   Thank you for allowing the Palliative Medicine Team to assist in the care of this patient.   Total Time 40 min Prolonged Time Billed  no       Greater than 50%  of this time was spent counseling and coordinating care related to the above assessment and plan.  Vinie Sill, NP Palliative Medicine Team Pager # [phone] (M-F 8a-5p) Team Phone # [phone]  (Nights/Weekends)

## 2017-02-09 NOTE — Progress Notes (Signed)
PULMONARY / CRITICAL CARE MEDICINE   Name: [name] MRN: [id number] DOB: 05-03-1955    ADMISSION DATE:  02/02/2017  HISTORY OF PRESENT ILLNESS:   [name] a 62 y.o.female former smoker with a 120 pack year smoking history ( Quit 2011)   withhistory of COPD, diabetes mellitus type 2, embolic CVA, CHF and known history of lung mass ( Discovered 11/2015 during hospital admission)  was brought to the ER the patient was found to be increasingly confused by patient's husband. As per the husband patient has not been ambulatory as usual which is progressively worse last 2 weeks. Patient also has been increasingly short of breath. Denied chest pain productive cough fever chills or any loss of consciousness nausea vomiting or diarrhea upon admission. CTA chest was done which was negative for PE but was positive for Diffuse attenuation and narrowing of left-sided pulmonary vessels due to known pulmonary  mass and consolidative process, large left pleural effusion,possible pneumonia and increased AP adenopathy and left adrenal gland measuring 11 mm. Pt's dyspnea did not improve, and she underwent a therapeutic/diagnostic thoracentesis 12/29 with removal of 1280 cc's of thin serosanguinous fluid . Fluid was sent for evaluation. Previous Thora done 09/2016 did not reveal malignant cells. CCM were asked to consult for lung mass and pleural effusion, COPD exacerbation and  Suspected pneumonia. ABG 12/29  revealed CO2 of 101. Bi Pap was initiated with improvement in mental status.  Per husband, patient is non-compliant with her pulmonary nebulizer treatments. She uses her Pro Air and Combivent inhalers " Way too much" Additionally she does not maintain good follow up regarding medical appointments. Husband states she cancels appointments frequently.     SUBJECTIVE: Difficult to arouse but in no acute distress.  VITAL SIGNS: BP 129/63 (BP Location: Right Wrist)   Pulse 77   Temp 97.9 F (36.6 C)  (Axillary)   Resp 16   Ht 5\' 7"  (1.702 m)   Wt 71.3 kg (157 lb 3 oz)   SpO2 99%   BMI 24.62 kg/m     INTAKE / OUTPUT: I/O last 3 completed shifts: In: 28 [P.O.:960] Out: 2050 [Urine:2050]  PHYSICAL EXAMINATION: General: Disheveled obese female difficult to arouse HEENT: Edentulous, without lymphadenopathy PSY: Strange affect Neuro: Follows commands moves all extremities CV: Sounds are regular regular rate and rhythm PULM: Decreased breath sounds on the left CW:CBJS, non-tender, bsx4 active mild distention Extremities: warm/dry, 1+ edema  Skin: no rashes or lesions    LABS:  BMET Recent Labs  Lab 02/07/17 0358 02/08/17 0317 02/09/17 0307  NA 136 132* 134*  K 4.9 4.9 4.6  CL 78* 76* 79*  CO2 >50* 48* 47*  BUN 16 14 18   CREATININE 0.90 0.66 0.65  GLUCOSE 292* 247* 272*    Electrolytes Recent Labs  Lab 02/07/17 0358 02/08/17 0317 02/09/17 0307  CALCIUM 9.0 8.6* 8.8*  MG  --   --  2.0  PHOS  --   --  3.1    CBC Recent Labs  Lab 02/07/17 0358 02/08/17 0317 02/09/17 0307  WBC 8.3 9.1 10.1  HGB 7.9* 7.8* 8.2*  HCT 30.8* 30.5* 31.5*  PLT 238 214 234    Coag's No results for input(s): APTT, INR in the last 168 hours.  Sepsis Markers Recent Labs  Lab 02/02/17 1737  LATICACIDVEN 1.14    ABG Recent Labs  Lab 02/03/17 1810  PHART 7.339*  PCO2ART 101*  PO2ART 110*    Liver Enzymes Recent Labs  Lab 02/02/17 1724  02/09/17 0307  AST 12* 17  ALT 9* 17  ALKPHOS 78 70  BILITOT 0.8 0.3  ALBUMIN 2.1* 2.0*    Cardiac Enzymes No results for input(s): TROPONINI, PROBNP in the last 168 hours.  Glucose Recent Labs  Lab 02/07/17 2148 02/08/17 0616 02/08/17 1135 02/08/17 1619 02/08/17 2044 02/09/17 0604  GLUCAP 368* 213* 192* 310* 153* 215*    Imaging Ct Chest Wo Contrast  Result Date: 02/08/2017 CLINICAL DATA:  Confusion.  Known left upper lobe lung mass. EXAM: CT CHEST WITHOUT CONTRAST TECHNIQUE: Multidetector CT imaging of the  chest was performed following the standard protocol without IV contrast. COMPARISON:  Chest CT 02/02/2017 FINDINGS: Cardiovascular: The heart is normal in size. No pericardial effusion. Stable tortuosity, ectasia and dense calcification of the thoracic aorta and branch vessels including the coronary arteries. Mediastinum/Nodes: Stable prevascular and aorticopulmonary window lymph nodes. The esophagus is grossly normal. Lungs/Pleura: Severe emphysematous changes with areas of pulmonary scarring. Large left upper lobe lung mass is again demonstrated which appears to have tortuous branching tumor extending from it. This is likely endobronchial spread of tumor in the left upper lobe. There is an associated moderate to large left pleural effusion with overlying atelectasis. No pulmonary nodules in the right lung suggest metastatic disease. Upper Abdomen: No obvious hepatic metastatic disease. No adrenal gland metastasis. A right renal calculus is noted. Advanced atherosclerotic calcifications involving the upper abdominal aorta and branch vessels. Cholelithiasis. Musculoskeletal: No breast masses, supraclavicular or axillary lymphadenopathy. 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 No worrisome bone lesions to suggest osseous metastatic disease. IMPRESSION: 1. Large left upper lobe lung mass is again demonstrated with probable endobronchial spread of tumor. 2. Moderate to large left pleural effusion with overlying atelectasis. 3. Stable prevascular and aorticopulmonary window lymph nodes. 4. Advanced emphysematous changes and areas pulmonary scarring. No obvious metastatic pulmonary nodules. 5. No findings suggest upper abdominal metastatic disease. Aortic Atherosclerosis (ICD10-I70.0) and Emphysema (ICD10-J43.9). Electronically Signed   By: Marijo Sanes [name]   On: 02/08/2017 15:46   Dg Chest Port 1 View  Result Date: 02/09/2017 CLINICAL DATA:  Shortness of breath. EXAM: PORTABLE CHEST 1 VIEW COMPARISON:  CT scan and  radiograph of February 08, 2017. FINDINGS: Stable cardiomegaly. Atherosclerosis of thoracic aorta is noted. Sternotomy wires are noted. Stable large left lung mass is noted. Right lung is clear. Stable left basilar atelectasis or infiltrate is noted with associated pleural effusion. Bony thorax is unremarkable. IMPRESSION: Stable large left lung mass. Stable left basilar atelectasis or infiltrate is noted with associated pleural effusion. Electronically Signed   By: Marijo Conception, [name]   On: 02/09/2017 09:06   Dg Chest Port 1 View  Result Date: 02/08/2017 CLINICAL DATA:  Shortness of breath. EXAM: PORTABLE CHEST 1 VIEW COMPARISON:  02/07/2017 FINDINGS: Stable large left lung mass. Mild stable cardiac enlargement with left lower lobe process likely a combination of effusion and atelectasis. The right lung remains relatively clear. The bony thorax is intact. IMPRESSION: Stable large left upper lobe lung mass and persistent left lower lobe process likely a combination of effusion and atelectasis. The right lung remains clear. Stable underlying emphysematous changes. Electronically Signed   By: Marijo Sanes [name]   On: 02/08/2017 10:28   Dg Abd Portable 1v  Result Date: 02/08/2017 CLINICAL DATA:  Abdominal pain. EXAM: PORTABLE ABDOMEN - 1 VIEW COMPARISON:  None. FINDINGS: Large amount of stool throughout the colon and down into the rectum suggesting constipation. No findings for small bowel obstruction or free  air. Persistent left lower lobe process is noted. The bony structures are grossly intact. IMPRESSION: Large amount of stool throughout the colon suggesting constipation. Electronically Signed   By: Marijo Sanes [name]   On: 02/08/2017 10:30     STUDIES:  12/29 Thora>> Left pleural fluid cytology >> no malignant cells identified  ANTIBIOTICS: 02/05/2017>>Ceftin>> off   DISCUSSION: This is a 62 year old who at baseline has significant COPD.  She presented with altered mental status and hypercarbia.   She was found to have a large left pleural effusion which has been drained.  In addition she has a known left-sided mass, which is not been definitively diagnosed. 02/09/2017 per Dr. Chase Caller in the interventional diagnostics may create more harm than good.  ASSESSMENT / PLAN:  Acute On Chronic Respiratory Failure due to severe COPD and Large left lung mass with a likely malignant left pleural effusion, with no malignant cells.   Patient has significant baseline COPD; therefore, even should she prove to stage appropriately, she is likely not a candidate for resection. She had left thora 12/29 with 1280cc of fluid removed, exudate and cytology no malignant cells identified.  Unfortunately her repeat CXR 1/2 shows progression / recurrence of effusion. 02/09/2017 CT scan does not show any progression but does show moderate left pleural effusion and left upper lung mass and most likely lymphangitic spread.  Hypercapnic respiratory failure - started on PRN BiPAP. Plan: Lasix as needed.  Pleural fluid cytology with no malignant cells identified on 02/03/2017.  Option of fiberoptic bronchoscopy for further diagnosis is felt to be too dangerous at this time.  Best option would be to proceed with palliation and goal of care being comfort.  She is a full DNR   Continue BiPAP nightly and as needed for comfort  Titrate oxygen to keep her O2 sats 1893% she is on 5 L home oxygen  She will need ongoing discussions with primary care pulmonary concerning her pursuit of full palliation.  It is doubtful that she would survive any type of chemo or surgical interventions for her presumed lung cancer.  P CCM will be available as needed.   Rest per primary team.   Richardson Landry Minor ACNP [facility] Pager [phone] till 1 pm If no answer page [phone] 02/09/2017, 9:57 AM

## 2017-02-10 DIAGNOSIS — R1084 Generalized abdominal pain: Secondary | ICD-10-CM

## 2017-02-10 DIAGNOSIS — R0602 Shortness of breath: Secondary | ICD-10-CM

## 2017-02-10 LAB — COMPREHENSIVE METABOLIC PANEL
ALBUMIN: 2.1 g/dL — AB (ref 3.5–5.0)
ALT: 17 U/L (ref 14–54)
AST: 14 U/L — AB (ref 15–41)
Alkaline Phosphatase: 82 U/L (ref 38–126)
Anion gap: 6 (ref 5–15)
BUN: 19 mg/dL (ref 6–20)
CHLORIDE: 81 mmol/L — AB (ref 101–111)
CO2: 48 mmol/L — AB (ref 22–32)
Calcium: 8.9 mg/dL (ref 8.9–10.3)
Creatinine, Ser: 0.75 mg/dL (ref 0.44–1.00)
GFR calc Af Amer: >60 mL/min (ref >60)
GFR calc non Af Amer: >60 mL/min (ref >60)
GLUCOSE: 341 mg/dL — AB (ref 65–99)
POTASSIUM: 5.2 mmol/L — AB (ref 3.5–5.1)
Sodium: 135 mmol/L (ref 135–145)
Total Bilirubin: 0.3 mg/dL (ref 0.3–1.2)
Total Protein: 6.5 g/dL (ref 6.5–8.1)

## 2017-02-10 LAB — CBC WITH DIFFERENTIAL/PLATELET
BASOS PCT: 0 %
Basophils Absolute: 0 10*3/uL (ref 0.0–0.1)
EOS PCT: 0 %
Eosinophils Absolute: 0 10*3/uL (ref 0.0–0.7)
HCT: 32.1 % — ABNORMAL LOW (ref 36.0–46.0)
Hemoglobin: 8.5 g/dL — ABNORMAL LOW (ref 12.0–15.0)
Lymphocytes Relative: 4 %
Lymphs Abs: 0.5 10*3/uL — ABNORMAL LOW (ref 0.7–4.0)
MCH: 24.8 pg — ABNORMAL LOW (ref 26.0–34.0)
MCHC: 26.5 g/dL — ABNORMAL LOW (ref 30.0–36.0)
MCV: 93.6 fL (ref 78.0–100.0)
Monocytes Absolute: 0.5 10*3/uL (ref 0.1–1.0)
Monocytes Relative: 4 %
NEUTROS ABS: 10.6 10*3/uL — AB (ref 1.7–7.7)
Neutrophils Relative %: 92 %
PLATELETS: 246 10*3/uL (ref 150–400)
RBC: 3.43 MIL/uL — AB (ref 3.87–5.11)
RDW: 14.3 % (ref 11.5–15.5)
WBC: 11.6 10*3/uL — AB (ref 4.0–10.5)

## 2017-02-10 LAB — MAGNESIUM: Magnesium: 2.2 mg/dL (ref 1.7–2.4)

## 2017-02-10 LAB — GLUCOSE, CAPILLARY
Glucose-Capillary: 397 mg/dL — ABNORMAL HIGH (ref 65–99)
Glucose-Capillary: 166 mg/dL — ABNORMAL HIGH (ref 65–99)

## 2017-02-10 LAB — PHOSPHORUS: Phosphorus: 3.1 mg/dL (ref 2.5–4.6)

## 2017-02-10 MED ORDER — PREDNISONE 10 MG PO TABS: ORAL_TABLET | ORAL | 0 refills | Status: AC

## 2017-02-10 MED ORDER — FUROSEMIDE 10 MG/ML IJ SOLN
20.0000 mg | Freq: Once | INTRAMUSCULAR | Status: AC
Start: 2017-02-10 — End: 2017-02-10
  Administered 2017-02-10: 20 mg via INTRAVENOUS
  Filled 2017-02-10: qty 2

## 2017-02-10 MED ORDER — MORPHINE SULFATE (CONCENTRATE) 10 MG/0.5ML PO SOLN: 5.0000 mg | ORAL | 0 refills | Status: AC | PRN

## 2017-02-10 MED ORDER — SENNOSIDES-DOCUSATE SODIUM 8.6-50 MG PO TABS: 1.0000 | ORAL_TABLET | Freq: Every evening | ORAL | 0 refills | Status: AC | PRN

## 2017-02-10 MED ORDER — MORPHINE SULFATE (CONCENTRATE) 10 MG/0.5ML PO SOLN: 5.0000 mg | ORAL | Status: DC | PRN

## 2017-02-10 MED ORDER — BUDESONIDE 0.25 MG/2ML IN SUSP: 0.2500 mg | Freq: Two times a day (BID) | RESPIRATORY_TRACT | 12 refills | Status: AC

## 2017-02-10 NOTE — Discharge Summary (Signed)
Physician Discharge Summary  [name] SJG:[id number] DOB: 1955-11-18 DOA: 02/02/2017  PCP: Brand Males, MD  Admit date: 02/02/2017 Discharge date: 02/10/2017  Admitted From: Home Disposition:  Home  Recommendations for Outpatient Follow-up:  1. Follow up with PCP in 1-2 weeks 2. Palliative Care to Follow at D/C 3. Follow up with Pulmonary as an outpatient  4. Please obtain CMP/CBC, Mag, Phos in one week 5. Please follow up on the following pending results:  Home Health: No Equipment/Devices: None  Discharge Condition: Guarded  CODE STATUS: DNR/DNI Diet recommendation: Heart Healthy Diet   Brief/Interim Summary: [name] a 62 y.o.femalewithhistory of COPD, diabetes mellitus type 2, embolic CVA, CHF and known history of lung mass was brought to the ER the patient was found to be increasingly confused by patient's husband. As per the husband patient has not been ambulatory as usual which is progressively worse last 2 weeks. Patient also has been increasingly short of breath. Denies any chest pain productive cough fever chills or any loss of consciousness nausea vomiting or diarrhea.  In the ER patient was found to be hypoxic and short of breath. She was oriented to her name is able to recognize her husband and follows commands. Chest x-ray shows left-sided opacification concerning for pleural effusion and pneumonia. UA shows features concerning for UTI. CT head was unremarkable and CT angiogram of the chest was negative for pulmonary embolism but did show large left pleural effusion with possible consolidation. Lung mass was also seen. She underwent thoracentesis with re-accumulation of Fluid quite quickly. Pulmonary consulted and worked up mass up but they recommended that biopsy would be high risk and have recommended Hospice and Palliative Care as patient has continually refused BiPAP and Pluer-X Catheter. Brought up the discussion about possible hospice and  husband is agreeable but will have Palliative Involvement however after Palliative met with the patient and husband still not ready for Hospice. Patient being alert and oriented  refused repeat Thoracentesis, BiPAP, and Pleur-X Catheter placement. She will be D/C'd Home and will have Palliative Care to Follow and discussed with the patient that she was at high risk for re-admission as she has refused therapeutic Treatments.   Discharge Diagnoses:  Principal Problem:   Acute on chronic respiratory failure with hypoxia and hypercapnia (HCC) Active Problems:   Type 2 diabetes mellitus with complication, with long-term current use of insulin (HCC)   Chronic respiratory failure (HCC)   Chronic diastolic CHF (congestive heart failure) (HCC)   Mass of left lung   Diabetes mellitus with complication (HCC)   COPD exacerbation (HCC)   Chronic diastolic heart failure (HCC)   Acute metabolic encephalopathy   Pleural effusion, left   Pneumonia   Palliative care encounter   Malignant neoplasm of upper lobe of left lung (HCC)  Acute on chronic hypoxic, hypercapnic resp failuresecondary to large left pleural effusion, known left lung mass, suspected pneumonia, COPD exacerbation.  -S/p large volume thoracentesis 12/29, fluids culture no growth , cytology negative for Malignancy -She refusedBiPAP at night intermittently and continues to refuse; Husband reported bipap can be put on her only when she is drowsy and confused. -She was treated with vanc/zosyn since admission, pleural fluids culture negative, no fever, IV Abx D/C'd. -Continue steroids and changed to po, bronchodilators -Pulmonary ordered CT of Chest to find best approach for tissue diagnosis to help plan palliation but now recommending full palliation and Comfort Care given patient's denial and refusal to have workup; Patient and husband resistant to Hospice at  this Point.  -CT Chest w/o Contrast showed Large left upper lobe lung mass is again  demonstrated with probable endobronchial spread of tumor. Moderate to large left pleural effusion with overlying atelectasis. Stable prevascular and aorticopulmonary window lymph nodes. Advanced emphysematous changes and areas pulmonary scarring. No obvious metastatic pulmonary nodules. No findings suggest upper abdominal metastatic disease -Palliative Care Medicine followed up with patient and husband and refusing Hospice at this point. -Patient refusing Repeat Thoracentesis, BiPAP, and Pleur-X Catheter and opposed to the idea of Hospice at this point; Patient wanting to go home and will D/C home as patient has been refusing Care -Will start Low Dose Morphine (Roxinal) for Dyspnea at D/C -High Risk for Re-admission -Palliative Care to Follow at D/C -Follow up with PCP and Pulmonary as an outpatient   Left Lung Mass. -Noted on office visit 04/2016, hospitalization 06/2016 and 09/2016 but patient put off follow-up.Husband reports patient never recuperate enough to make it to follow up appointment. -F/u cytology Negative,  -Chest CT w/o Contrast showed Large left upper lobe lung mass is again demonstrated with probable endobronchial spread of tumor. Moderate to large left pleural effusion with overlying atelectasis. Stable prevascular and aorticopulmonary window lymph nodes. Advanced emphysematous changes and areas pulmonary scarring. Noobvious metastatic pulmonary nodules. No findings suggest upper abdominal metastatic disease -Appreciate PMT involvement; Pulmonary recommending Hospice and full palliation at this Point but patient and Husband not ready for it -Follow up PCP and Pulmonary as an outpatient   COPD, Chronic Hypoxic Respiratory Failure on home oxygen 5 L -Continue tx as above -Has had CO2 retention, she refuses night time bipap and refusing it again   E coli UTI -She was on zosyn thenCeftin, finished total of 5 day treatment as of 1/2.  Acute Encephalopathy -Secondary to  above -Continue treatments as above  -she was more somnolent yesterday and awoke and refuses her care  -Today she was more alert today and continually refusing Care  Chronic diastolic CHF -No lower extremity edema -Appears compensated  -C/w Strict Is/O's; Patient is -3.67 Liters; Weight is the same since admission -Given IV Lasix x1 prior to D/C -C/w Lasix daily as needed  HTN; bp borderline -Continue Betablocker, Lasix, -Resume Imdur  DM type 2 on Insulin -HbA1c was 6.4 -CBG high, partly due to on steroids; Changed to po Prednisone taper  -Continue with Lantus 25 units daily, Novolog 5 units TID, and Sensitive Novolog SSI AC/HS -CBG's ranging from 166-397  PMH CVA on Xarelto -Continue Xarelto 20 mg  and Atorvastatin 20 mg po qHS  Aortic atherosclerosis -Continue Atorvastatin as above  Normocytic Anemia/Anemia of Chronic Disease -Hgb/Hct stable at 8.5/32.1; no bleeding at this point -Continue to Monitor for S/Sx of Bleeding as patient is Anticoagulated with Rivaroxaban  -No indication for transfusion -Repeat CBC as an outpatient   Anxiety -Appears Stable -Given Lorazepam 0.5 mg IV q4hprn while hospitalized  Hyperkalemia -Mild -Given IV Lasix x1 -Repeat CMP as an outpatient   Discharge Instructions  Discharge Instructions    (HEART FAILURE PATIENTS) Call MD:  Anytime you have any of the following symptoms: 1) 3 pound weight gain in 24 hours or 5 pounds in 1 week 2) shortness of breath, with or without a dry hacking cough 3) swelling in the hands, feet or stomach 4) if you have to sleep on extra pillows at night in order to breathe.   Complete by:  As directed    Call MD for:  difficulty breathing, headache or visual disturbances  Complete by:  As directed    Call MD for:  extreme fatigue   Complete by:  As directed    Call MD for:  hives   Complete by:  As directed    Call MD for:  persistant dizziness or light-headedness   Complete by:  As directed     Call MD for:  persistant nausea and vomiting   Complete by:  As directed    Call MD for:  redness, tenderness, or signs of infection (pain, swelling, redness, odor or green/yellow discharge around incision site)   Complete by:  As directed    Call MD for:  severe uncontrolled pain   Complete by:  As directed    Call MD for:  temperature >100.4   Complete by:  As directed    Diet - low sodium heart healthy   Complete by:  As directed    Discharge instructions   Complete by:  As directed    Follow up with PCP and Pulmonary at D/C. Palliative Care to follow at D/C. Take all medications. If symptoms change or worsen please return to the ED for evaluation.   Increase activity slowly   Complete by:  As directed      Allergies as of 02/10/2017      Reactions   Ciprofloxacin Rash   REACTION: hives/rash      Medication List    TAKE these medications   albuterol (2.5 MG/3ML) 0.083% nebulizer solution Commonly known as:  PROVENTIL Take 3 mLs (2.5 mg total) by nebulization every 2 (two) hours as needed for wheezing.   atorvastatin 20 MG tablet Commonly known as:  LIPITOR Take 1 tablet (20 mg total) by mouth daily.   budesonide 0.25 MG/2ML nebulizer solution Commonly known as:  PULMICORT Take 2 mLs (0.25 mg total) by nebulization 2 (two) times daily.   furosemide 20 MG tablet Commonly known as:  LASIX Take 1 tablet (20 mg total) by mouth daily as needed.   gabapentin 100 MG capsule Commonly known as:  NEURONTIN Take 100 mg by mouth 3 (three) times daily.   HYDROcodone-acetaminophen 5-325 MG tablet Commonly known as:  NORCO/VICODIN Take 1-2 tablets by mouth every 6 (six) hours as needed for moderate pain.   insulin aspart 100 UNIT/ML FlexPen Commonly known as:  NOVOLOG FLEXPEN Inject 6 Units into the skin 3 (three) times daily with meals. What changed:    how much to take  when to take this   ipratropium-albuterol 0.5-2.5 (3) MG/3ML Soln Commonly known as:  DUONEB Take 3  mLs by nebulization every 4 (four) hours as needed. What changed:  reasons to take this   Ipratropium-Albuterol 20-100 MCG/ACT Aers respimat Commonly known as:  COMBIVENT RESPIMAT Inhale 1-2 puffs into the lungs 4 (four) times daily. What changed:  Another medication with the same name was changed. Make sure you understand how and when to take each.   isosorbide mononitrate 60 MG 24 hr tablet Commonly known as:  IMDUR Take 60 mg by mouth daily.   metoprolol tartrate 25 MG tablet Commonly known as:  LOPRESSOR Take 0.5 tablets (12.5 mg total) by mouth 2 (two) times daily. What changed:  when to take this   morphine CONCENTRATE 10 MG/0.5ML Soln concentrated solution Place 0.25 mLs (5 mg total) under the tongue every 3 (three) hours as needed for moderate pain, severe pain or shortness of breath.   nystatin powder Commonly known as:  MYCOSTATIN/NYSTOP Apply topically 3 (three) times daily.   pantoprazole  40 MG tablet Commonly known as:  PROTONIX Take 1 tablet (40 mg total) by mouth daily.   Potassium Chloride ER 20 MEQ Tbcr Take 20 mEq by mouth daily as needed. What changed:  reasons to take this   predniSONE 10 MG tablet Commonly known as:  DELTASONE 40 mg x 2 days 30 mg x 4 days 20 mg x 4 days 10 mg x 4 days   rivaroxaban 20 MG Tabs tablet Commonly known as:  XARELTO Take 1 tablet (20 mg total) by mouth daily.   senna-docusate 8.6-50 MG tablet Commonly known as:  Senokot-S Take 1 tablet by mouth at bedtime as needed for mild constipation or moderate constipation.   TRESIBA FLEXTOUCH 100 UNIT/ML Sopn FlexTouch Pen Generic drug:  insulin degludec Inject 20 Units into the skin daily.       Allergies  Allergen Reactions  . Ciprofloxacin Rash    REACTION: hives/rash   Consultations:  Pulmonary  Palliative Care Medicine  Procedures/Studies: Dg Chest 1 View  Result Date: 02/03/2017 CLINICAL DATA:  Status post left thoracentesis EXAM: CHEST 1 VIEW  COMPARISON:  02/02/2017 FINDINGS: Right lung is again well aerated. Cardiac shadow is stable. Postsurgical changes are again noted. Interval thoracentesis has been performed. A small residual pleural effusion is noted inferiorly. The large central mass lesion is noted which corresponds to that seen on prior CT examination. No pneumothorax is noted. IMPRESSION: No evidence of pneumothorax following thoracentesis. Small residual pleural effusion is noted. Large central mass lesion on the left. Electronically Signed   By: Inez Catalina [name]   On: 02/03/2017 15:25   Ct Head Wo Contrast  Result Date: 02/02/2017 CLINICAL DATA:  .62 year old female with altered mental status. EXAM: CT HEAD WITHOUT CONTRAST TECHNIQUE: Contiguous axial images were obtained from the base of the skull through the vertex without intravenous contrast. COMPARISON:  Head CT dated 09/16/2016 FINDINGS: Evaluation of this exam is limited due to motion artifact. Brain: The ventricles and sulci appropriate size for patient's age. The gray-white matter discrimination is preserved. There is no acute intracranial hemorrhage. No mass effect or midline shift. No extra-axial fluid collection. Vascular: No hyperdense vessel or unexpected calcification. Skull: Normal. Negative for fracture or focal lesion. Sinuses/Orbits: No acute finding. Other: None IMPRESSION: No acute intracranial pathology on a motion degraded study. Electronically Signed   By: Anner Crete [name]   On: 02/02/2017 23:32   Ct Chest Wo Contrast  Result Date: 02/08/2017 CLINICAL DATA:  Confusion.  Known left upper lobe lung mass. EXAM: CT CHEST WITHOUT CONTRAST TECHNIQUE: Multidetector CT imaging of the chest was performed following the standard protocol without IV contrast. COMPARISON:  Chest CT 02/02/2017 FINDINGS: Cardiovascular: The heart is normal in size. No pericardial effusion. Stable tortuosity, ectasia and dense calcification of the thoracic aorta and branch vessels  including the coronary arteries. Mediastinum/Nodes: Stable prevascular and aorticopulmonary window lymph nodes. The esophagus is grossly normal. Lungs/Pleura: Severe emphysematous changes with areas of pulmonary scarring. Large left upper lobe lung mass is again demonstrated which appears to have tortuous branching tumor extending from it. This is likely endobronchial spread of tumor in the left upper lobe. There is an associated moderate to large left pleural effusion with overlying atelectasis. No pulmonary nodules in the right lung suggest metastatic disease. Upper Abdomen: No obvious hepatic metastatic disease. No adrenal gland metastasis. A right renal calculus is noted. Advanced atherosclerotic calcifications involving the upper abdominal aorta and branch vessels. Cholelithiasis. Musculoskeletal: No breast masses, supraclavicular or axillary lymphadenopathy.  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 No worrisome bone lesions to suggest osseous metastatic disease. IMPRESSION: 1. Large left upper lobe lung mass is again demonstrated with probable endobronchial spread of tumor. 2. Moderate to large left pleural effusion with overlying atelectasis. 3. Stable prevascular and aorticopulmonary window lymph nodes. 4. Advanced emphysematous changes and areas pulmonary scarring. No obvious metastatic pulmonary nodules. 5. No findings suggest upper abdominal metastatic disease. Aortic Atherosclerosis (ICD10-I70.0) and Emphysema (ICD10-J43.9). Electronically Signed   By: Marijo Sanes [name]   On: 02/08/2017 15:46   Ct Angio Chest Pe W Or Wo Contrast  Result Date: 02/02/2017 CLINICAL DATA:  Altered mental status hypoxia EXAM: CT ANGIOGRAPHY CHEST WITH CONTRAST TECHNIQUE: Multidetector CT imaging of the chest was performed using the standard protocol during bolus administration of intravenous contrast. Multiplanar CT image reconstructions and MIPs were obtained to evaluate the vascular anatomy. CONTRAST:  134m ISOVUE-370  IOPAMIDOL (ISOVUE-370) INJECTION 76% COMPARISON:  02/02/2017, 09/17/2006 FINDINGS: Cardiovascular: Satisfactory opacification of the pulmonary arteries to the segmental level. No evidence of pulmonary embolism. Left-sided pulmonary vessels are attenuated by diffuse left lung consolidation but no filling defects are seen. Aortic atherosclerosis. Coronary artery calcification. Normal heart size. No large pericardial effusion. Mediastinum/Nodes: No thyroid mass. Midline trachea. Similar appearance of right precarinal lymph nodes. Increased AP window adenopathy, with lymph nodes measuring up to 13 mm. Esophagus unremarkable Lungs/Pleura: Large left pleural effusion. Diffuse consolidation throughout the left lung. Central low density round area in the central left upper lobe is suspected to represent the patient's known lung mass, this region measures approximately 7 cm but it is difficult to distinguish from adjacent consolidated lung parenchyma. Moderate emphysema in the right lung Upper Abdomen: Ill-defined nodularity of the left adrenal gland measuring about 11 mm. Small stones in the gallbladder. Musculoskeletal: Sternotomy changes. No acute or suspicious bone lesion. Review of the MIP images confirms the above findings. IMPRESSION: 1. No definite acute pulmonary embolus is visualized. Diffuse attenuation and narrowing of left-sided pulmonary vessels due to mass and consolidative process but no filling defects are seen. 2. Large left pleural effusion. Diffuse consolidation of the left lung parenchyma. Hypodense masslike area in the central left upper lobe is suspected to represent the patient's lung mass, it is difficult to distinguish from adjacent atelectatic lung or pneumonia, suspect that the mass is increased in size. Increased AP window adenopathy. 3. Ill-defined left adrenal gland nodule measuring 11 mm 4. Small gallstones 5. Emphysema Aortic Atherosclerosis (ICD10-I70.0) and Emphysema (ICD10-J43.9).  Electronically Signed   By: KDonavan FoilM.D.   On: 02/02/2017 23:40   Dg Chest Port 1 View  Result Date: 02/09/2017 CLINICAL DATA:  Shortness of breath. EXAM: PORTABLE CHEST 1 VIEW COMPARISON:  CT scan and radiograph of February 08, 2017. FINDINGS: Stable cardiomegaly. Atherosclerosis of thoracic aorta is noted. Sternotomy wires are noted. Stable large left lung mass is noted. Right lung is clear. Stable left basilar atelectasis or infiltrate is noted with associated pleural effusion. Bony thorax is unremarkable. IMPRESSION: Stable large left lung mass. Stable left basilar atelectasis or infiltrate is noted with associated pleural effusion. Electronically Signed   By: JMarijo Conception [name]   On: 02/09/2017 09:06   Dg Chest Port 1 View  Result Date: 02/08/2017 CLINICAL DATA:  Shortness of breath. EXAM: PORTABLE CHEST 1 VIEW COMPARISON:  02/07/2017 FINDINGS: Stable large left lung mass. Mild stable cardiac enlargement with left lower lobe process likely a combination of effusion and atelectasis. The right lung remains relatively clear. The bony  thorax is intact. IMPRESSION: Stable large left upper lobe lung mass and persistent left lower lobe process likely a combination of effusion and atelectasis. The right lung remains clear. Stable underlying emphysematous changes. Electronically Signed   By: Marijo Sanes [name]   On: 02/08/2017 10:28   Dg Chest Port 1 View  Result Date: 02/07/2017 CLINICAL DATA:  Short of breath respiratory failure EXAM: PORTABLE CHEST 1 VIEW COMPARISON:  02/05/2017 FINDINGS: Left upper lobe mass lesion unchanged. Progressive left effusion and left lower lobe atelectasis. Right lung remains clear. Prior CABG. IMPRESSION: Left upper lobe mass lesion. Progression of left lower lobe atelectasis and effusion. Electronically Signed   By: Franchot Gallo [name]   On: 02/07/2017 07:04   Dg Chest Port 1 View  Result Date: 02/05/2017 CLINICAL DATA:  Shortness of breath. EXAM: PORTABLE CHEST 1  VIEW COMPARISON:  02/04/2017 FINDINGS: Sequelae of prior CABG are again identified. The cardiac silhouette is mildly enlarged. Aortic atherosclerosis is noted. A left upper lobe lung mass is again noted. Patchy airspace and interstitial opacity in the left mid and lower lung has slightly increased. There is denser retrocardiac opacity in the left lung base which is similar to the prior study, and there is a persistent small left pleural effusion. The right lung remains clear. No pneumothorax is identified. IMPRESSION: 1. Mildly worsening aeration in the left mid and lower lung. 2. Persistent left pleural effusion. 3. Left upper lobe mass. Electronically Signed   By: Logan Bores [name]   On: 02/05/2017 07:32   Dg Chest Port 1 View  Result Date: 02/04/2017 CLINICAL DATA:  Respiratory failure EXAM: PORTABLE CHEST 1 VIEW COMPARISON:  02/03/2017 FINDINGS: Left pleural effusion improved. Central left upper lung zone opacity is smaller and improved. Right lung remains clear. Upper normal heart size. Postoperative changes. No pneumothorax. IMPRESSION: Improved central left upper lobe opacity. Improved left pleural effusion. No pneumothorax. Electronically Signed   By: Marybelle Killings [name]   On: 02/04/2017 08:58   Dg Chest Portable 1 View  Result Date: 02/02/2017 CLINICAL DATA:  Shortness of breath EXAM: PORTABLE CHEST 1 VIEW COMPARISON:  09/17/2016, CT 09/16/2016 FINDINGS: Diffuse opacity of the left thorax. Cardiomediastinal silhouette obscured. Vascular congestion on the right. Minimal atelectasis at the right base. Post sternotomy changes. Aortic atherosclerosis. IMPRESSION: 1. Diffuse opacification of left thorax may relate to combination of large pleural effusion and underlying atelectasis, pneumonia, or mass 2. Minimal atelectasis at the right base Electronically Signed   By: Donavan Foil [name]   On: 02/02/2017 17:51   Dg Abd Portable 1v  Result Date: 02/08/2017 CLINICAL DATA:  Abdominal pain. EXAM: PORTABLE  ABDOMEN - 1 VIEW COMPARISON:  None. FINDINGS: Large amount of stool throughout the colon and down into the rectum suggesting constipation. No findings for small bowel obstruction or free air. Persistent left lower lobe process is noted. The bony structures are grossly intact. IMPRESSION: Large amount of stool throughout the colon suggesting constipation. Electronically Signed   By: Marijo Sanes [name]   On: 02/08/2017 10:30   US Thoracentesis Asp Pleural Space W/img Guide  Result Date: 02/03/2017 INDICATION: 62 year old female with a history of recurrent left-sided pleural effusion. EXAM: ULTRASOUND GUIDED LEFT THORACENTESIS MEDICATIONS: None. COMPLICATIONS: None immediate. PROCEDURE: An ultrasound guided thoracentesis was thoroughly discussed with the patient and questions answered. The benefits, risks, alternatives and complications were also discussed. The patient understands and wishes to proceed with the procedure. Written consent was obtained. Ultrasound was performed to localize and mark an  adequate pocket of fluid in the left chest. The area was then prepped and draped in the normal sterile fashion. 1% Lidocaine was used for local anesthesia. Under ultrasound guidance a 8 Fr Safe-T-Centesis catheter was introduced. Thoracentesis was performed. The catheter was removed and a dressing applied. FINDINGS: A total of approximately 2200 cc of serosanguineous fluid was removed. Samples were sent to the laboratory as requested by the clinical team. IMPRESSION: Status post ultrasound-guided left thoracentesis. Signed, Dulcy Fanny. Earleen Newport, DO [facility] Electronically Signed   By: Corrie Mckusick D.O.   On: 02/03/2017 16:19     Subjective: Seen and examined and refusing care and wants to go home. Opposed to the idea of Hospice and does not want repeat Thoracentesis, Pleur-X, or BiPAP. Palliative care to follow at D/C.  Discharge Exam: Vitals:   02/10/17  1158 02/10/17 1503  BP: 103/70   Pulse: 80   Resp: 17   Temp: 98.9 F (37.2 C)   SpO2: 96% 92%   Vitals:   02/10/17 0358 02/10/17 0941 02/10/17 1158 02/10/17 1503  BP: (!) 118/55  103/70   Pulse: 77 76 80   Resp: _0 Temp: 98.3 F (36.8 C)  98.9 F (37.2 C)   TempSrc: Oral  Axillary   SpO2: 96% 96% 96% 92%  Weight: 72.3 kg (159 lb 6.3 oz)     Height:       General: Pt is alert, awake; in mild respiratory distress Cardiovascular: RRR, S1/S2 +, no rubs, no gallops Respiratory: Diminished bilaterally with crackles and mild rhonchi; Wearing supplemental O2 via [place] Abdominal: Soft, Tender, ND, bowel sounds + Extremities: Mild edema, no cyanosis  The results of significant diagnostics from this hospitalization (including imaging, microbiology, ancillary and laboratory) are listed below for reference.    Microbiology: Recent Results (from the past 240 hour(s))  Urine culture     Status: Abnormal   Collection Time: 02/02/17  7:56 PM  Result Value Ref Range Status   Specimen Description URINE, RANDOM  Final   Special Requests NONE  Final   Culture >=100,000 COLONIES/mL ESCHERICHIA COLI (A)  Final   Report Status 02/04/2017 FINAL  Final   Organism ID, Bacteria ESCHERICHIA COLI (A)  Final      Susceptibility   Escherichia coli - MIC*    AMPICILLIN >=32 RESISTANT Resistant     CEFAZOLIN <=4 SENSITIVE Sensitive     CEFTRIAXONE <=1 SENSITIVE Sensitive     CIPROFLOXACIN <=0.25 SENSITIVE Sensitive     GENTAMICIN <=1 SENSITIVE Sensitive     IMIPENEM <=0.25 SENSITIVE Sensitive     NITROFURANTOIN <=16 SENSITIVE Sensitive     TRIMETH/SULFA <=20 SENSITIVE Sensitive     AMPICILLIN/SULBACTAM >=32 RESISTANT Resistant     PIP/TAZO <=4 SENSITIVE Sensitive     Extended ESBL NEGATIVE Sensitive     * >=100,000 COLONIES/mL ESCHERICHIA COLI  Culture, body fluid-bottle     Status: None   Collection Time: 02/03/17  2:53 PM  Result Value Ref Range Status   Specimen Description PLEURAL  LEFT  Final   Special Requests NONE  Final   Culture NO GROWTH 5 DAYS  Final   Report Status 02/08/2017 FINAL  Final  Gram stain     Status: None   Collection Time: 02/03/17  2:53 PM  Result Value Ref Range Status   Specimen Description PLEURAL LEFT  Final   Special Requests NONE  Final   Gram Stain NO WBC SEEN NO  ORGANISMS SEEN   Final   Report Status 02/03/2017 FINAL  Final     Labs: BNP (last 3 results) Recent Labs    06/14/16 0855 09/13/16 0253  BNP 407.7* 710.6*   Basic Metabolic Panel: Recent Labs  Lab 02/05/17 0254 02/07/17 0358 02/08/17 0317 02/09/17 0307 02/10/17 0301  NA 134* 136 132* 134* 135  K 3.8 4.9 4.9 4.6 5.2*  CL 74* 78* 76* 79* 81*  CO2 49* >50* 48* 47* 48*  GLUCOSE 259* 292* 247* 272* 341*  BUN _0 CREATININE 0.76 0.90 0.66 0.65 0.75  CALCIUM 9.2 9.0 8.6* 8.8* 8.9  MG  --   --   --  2.0 2.2  PHOS  --   --   --  3.1 3.1   Liver Function Tests: Recent Labs  Lab 02/09/17 0307 02/10/17 0301  AST 17 14*  ALT 17 17  ALKPHOS 70 82  BILITOT 0.3 0.3  PROT 6.5 6.5  ALBUMIN 2.0* 2.1*   No results for input(s): LIPASE, AMYLASE in the last 168 hours. No results for input(s): AMMONIA in the last 168 hours. CBC: Recent Labs  Lab 02/05/17 0254 02/07/17 0358 02/08/17 0317 02/09/17 0307 02/10/17 0301  WBC 12.2* 8.3 9.1 10.1 11.6*  NEUTROABS  --   --   --  9.2* 10.6*  HGB 7.6* 7.9* 7.8* 8.2* 8.5*  HCT 28.4* 30.8* 30.5* 31.5* 32.1*  MCV 93.1 96.3 93.3 92.6 93.6  PLT 281 238 214 234 246   Cardiac Enzymes: No results for input(s): CKTOTAL, CKMB, CKMBINDEX, TROPONINI in the last 168 hours. BNP: Invalid input(s): POCBNP CBG: Recent Labs  Lab 02/09/17 1107 02/09/17 1630 02/09/17 2111 02/10/17 0558 02/10/17 1151  GLUCAP 101* 229* 248* 397* 166*   D-Dimer No results for input(s): DDIMER in the last 72 hours. Hgb A1c Recent Labs    02/08/17 0317  HGBA1C 6.4*   Lipid Profile No results for input(s): CHOL, HDL, LDLCALC,  TRIG, CHOLHDL, LDLDIRECT in the last 72 hours. Thyroid function studies Recent Labs    02/08/17 0317  TSH 0.888   Anemia work up No results for input(s): VITAMINB12, FOLATE, FERRITIN, TIBC, IRON, RETICCTPCT in the last 72 hours. Urinalysis    Component Value Date/Time   COLORURINE YELLOW 02/02/2017 1857   APPEARANCEUR TURBID (A) 02/02/2017 1857   LABSPEC 1.025 02/02/2017 1857   PHURINE 6.0 02/02/2017 1857   GLUCOSEU NEGATIVE 02/02/2017 1857   HGBUR MODERATE (A) 02/02/2017 1857   BILIRUBINUR SMALL (A) 02/02/2017 1857   KETONESUR 15 (A) 02/02/2017 1857   PROTEINUR 100 (A) 02/02/2017 1857   NITRITE POSITIVE (A) 02/02/2017 1857   LEUKOCYTESUR MODERATE (A) 02/02/2017 1857   Sepsis Labs Invalid input(s): PROCALCITONIN,  WBC,  LACTICIDVEN Microbiology Recent Results (from the past 240 hour(s))  Urine culture     Status: Abnormal   Collection Time: 02/02/17  7:56 PM  Result Value Ref Range Status   Specimen Description URINE, RANDOM  Final   Special Requests NONE  Final   Culture >=100,000 COLONIES/mL ESCHERICHIA COLI (A)  Final   Report Status 02/04/2017 FINAL  Final   Organism ID, Bacteria ESCHERICHIA COLI (A)  Final      Susceptibility   Escherichia coli - MIC*    AMPICILLIN >=32 RESISTANT Resistant     CEFAZOLIN <=4 SENSITIVE Sensitive     CEFTRIAXONE <=1 SENSITIVE Sensitive     CIPROFLOXACIN <=0.25 SENSITIVE Sensitive     GENTAMICIN <=1 SENSITIVE Sensitive  IMIPENEM <=0.25 SENSITIVE Sensitive     NITROFURANTOIN <=16 SENSITIVE Sensitive     TRIMETH/SULFA <=20 SENSITIVE Sensitive     AMPICILLIN/SULBACTAM >=32 RESISTANT Resistant     PIP/TAZO <=4 SENSITIVE Sensitive     Extended ESBL NEGATIVE Sensitive     * >=100,000 COLONIES/mL ESCHERICHIA COLI  Culture, body fluid-bottle     Status: None   Collection Time: 02/03/17  2:53 PM  Result Value Ref Range Status   Specimen Description PLEURAL LEFT  Final   Special Requests NONE  Final   Culture NO GROWTH 5 DAYS  Final    Report Status 02/08/2017 FINAL  Final  Gram stain     Status: None   Collection Time: 02/03/17  2:53 PM  Result Value Ref Range Status   Specimen Description PLEURAL LEFT  Final   Special Requests NONE  Final   Gram Stain NO WBC SEEN NO ORGANISMS SEEN   Final   Report Status 02/03/2017 FINAL  Final   Time coordinating discharge: 35 minutes  SIGNED:  Kerney Elbe, DO [facility] 02/10/2017, 3:48 PM Pager [phone]  If 7PM-7AM, please contact night-coverage [email or web address] Password [email or web address]

## 2017-02-10 NOTE — Progress Notes (Signed)
Daily Progress Note   Patient Name: [name]       Date: 02/10/2017 DOB: 12-20-1955  Age: 62 y.o. MRN#: [id number] Attending Physician: Kerney Elbe, DO Primary Care Physician: Brand Males, MD Admit Date: 02/02/2017  Reason for Consultation/Follow-up: Establishing goals of care and Hospice Evaluation  Subjective: Patient alert during conversation but pleasantly confused. C/o sacral pain where pressure ulcer is per husband. RN to give prn morphine.   GOC:   Husband at bedside. Discussed course of hospitalization and f/u regarding hospice services. Explained focus on comfort, symptom management, and preventing recurrent hospitalization. Mr. Awad agrees that low-dose morphine has helped with her SOB in the past but was not able to be managed at home. I explained that hospice services will manage symptoms. Husband speaks of her having recurrent UTI's and yeast infections. I did explain that hospice will allow antibiotics if they provide comfort for his wife. Educated on differences between palliative and hospice services.   Mr. Sutherlin explains that doctors had recommended hospice 5 years ago and she is still alive. He is hesitant to accept hospice services at home because he still wants to "cure her." He does understand COPD is chronic and progressive, not "curable." I explained disease trajectory of COPD and often reaching a point where antibiotics, steroids, and nebs do not relieve SOB. Again discussed benefit of symptom management with end-stage COPD.   Mr. Woolworth is also hesitant to initiate hospice services because of his 3 sons. He is adamant to take her home. "I can take better care of her" compared to a nursing facility.   Spoke with case manager who will provide Mr.  Chaudhari a list of hospice agencies he can contact when ready. At the end of our conversation, he seems to be more accepting of the need for hospice in the very near future in order to prevent recurrent hospitalizations and crisis events.    Length of Stay: 8  Current Medications: Scheduled Meds:  . atorvastatin  20 mg Oral Daily  . budesonide (PULMICORT) nebulizer solution  0.25 mg Nebulization BID  . gabapentin  100 mg Oral TID  . insulin aspart  0-5 Units Subcutaneous QHS  . insulin aspart  0-9 Units Subcutaneous TID WC  . insulin aspart  5 Units Subcutaneous TID WC  . insulin glargine  25 Units Subcutaneous Daily  . ipratropium-albuterol  3 mL Nebulization TID  . methylPREDNISolone (SOLU-MEDROL) injection  40 mg Intravenous Q12H  . metoprolol tartrate  12.5 mg Oral BID  . polyethylene glycol  17 g Oral BID  . rivaroxaban  20 mg Oral Q supper  . senna-docusate  1 tablet Oral BID    Continuous Infusions:   PRN Meds: acetaminophen **OR** acetaminophen, albuterol, LORazepam, morphine injection, ondansetron **OR** ondansetron (ZOFRAN) IV  Physical Exam  Constitutional: She appears ill.  HENT:  Head: Normocephalic and atraumatic.  Cardiovascular: Regular rhythm.  Pulmonary/Chest: No accessory muscle usage. No tachypnea. No respiratory distress. She has decreased breath sounds.  Intermittent dyspnea at rest  Abdominal: Normal appearance.  Neurological: She is alert.  Pleasantly confused  Skin: Skin is warm and dry. There is pallor.  Psychiatric: Her speech is delayed. Cognition and memory are impaired.  Nursing note and vitals reviewed.         Vital Signs: BP 103/70 (BP Location: Left Arm)   Pulse 80   Temp 98.9 F (37.2 C) (Axillary)   Resp 17   Ht 5\' 7"  (1.702 m)   Wt 72.3 kg (159 lb 6.3 oz)   SpO2 92%   BMI 24.96 kg/m  SpO2: SpO2: 92 % O2 Device: O2 Device: Nasal Cannula O2 Flow Rate: O2 Flow Rate (L/min): 2 L/min  Intake/output summary:   Intake/Output  Summary (Last 24 hours) at 02/10/2017 1508 Last data filed at 02/10/2017 1300 Gross per 24 hour  Intake 1080 ml  Output 1850 ml  Net -770 ml   LBM: Last BM Date: 02/08/17 Baseline Weight: Weight: 72.5 kg (159 lb 13.3 oz) Most recent weight: Weight: 72.3 kg (159 lb 6.3 oz)       Palliative Assessment/Data: 30%   Flowsheet Rows     Most Recent Value  Intake Tab  Referral Department  Hospitalist  Unit at Time of Referral  Intermediate Care Unit  Palliative Care Primary Diagnosis  Pulmonary  Date Notified  02/03/17  Palliative Care Type  Return patient Palliative Care  Reason for referral  Clarify Goals of Care  Date of Admission  02/02/17  Date first seen by Palliative Care  02/04/17  # of days Palliative referral response time  1 Day(s)  # of days IP prior to Palliative referral  1  Clinical Assessment  Palliative Performance Scale Score  40%  Pain Max last 24 hours  Not able to report  Pain Min Last 24 hours  Not able to report  Dyspnea Max Last 24 Hours  Not able to report  Dyspnea Min Last 24 hours  Not able to report  Nausea Max Last 24 Hours  Not able to report  Nausea Min Last 24 Hours  Not able to report  Anxiety Max Last 24 Hours  Not able to report  Anxiety Min Last 24 Hours  Not able to report  Other Max Last 24 Hours  Not able to report  Psychosocial & Spiritual Assessment  Palliative Care Outcomes  Patient/Family meeting held?  Yes  Who was at the meeting?  pt  Palliative Care follow-up planned  Yes, Facility      Patient Active Problem List   Diagnosis Date Noted  . Malignant neoplasm of upper lobe of left lung ([place])   . Palliative care encounter   . Pleural effusion, left 02/03/2017  . Pneumonia 02/03/2017  . CO2 retention   . Palliative care by specialist   . Acute on chronic  respiratory failure with hypoxia and hypercapnia ([place]) 09/13/2016  . Mass of left lung 09/13/2016  . Diabetes mellitus with complication ([place]) 67/20/9470  . COPD exacerbation  ([place]) 09/13/2016  . Chronic diastolic heart failure ([place]) 09/13/2016  . Candidal dermatitis 09/13/2016  . Acute metabolic encephalopathy 96/28/3662  . Anemia, chronic disease 09/13/2016  . UTI (urinary tract infection) 09/13/2016  . Chronic diastolic CHF (congestive heart failure) ([place]) 06/26/2016  . Mass of upper lobe of left lung 06/26/2016  . Ventilator dependent ([place])   . Goals of care, counseling/discussion   . 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 Pressure injury of skin 12/04/2015  . Coronary artery disease without angina pectoris   . Morbid obesity ([place])   . Type 2 diabetes mellitus with complication, with long-term current use of insulin ([place]) 12/20/2012  . Atrial fibrillation ([place]) 12/20/2012  . Chronic respiratory failure ([place]) 12/20/2012  . HYPERLIPIDEMIA 03/11/2008  . Essential hypertension 03/11/2008  . CORONARY HEART DISEASE 03/11/2008    Palliative Care Assessment & Plan   HPI: 63 y.o. female  with past medical history of COPD on oxygen at home, coronary artery disease, history of MI, history of CVA, diabetes type 2, atrial fib, congestive heart failure, lung mass (found in October 2017; no outpatient workup) admitted on 02/02/2017 with altered mental status, dyspnea x 2 weeks with hypercarbia.  She was found to have a large left pleural effusion and underwent thoracentesis on 02/03/2017 for 1280 cc and have been awaiting cytology results. Pleural sample in August did not find any malignant cells. She has not completed her follow up appts as planned since August. Continues to decline with suspected lung cancer and end stage COPD. Consult ordered for goals of care.   Assessment: End stage COPD COPD exacerbation  Left pleural effusion Lung mass Dyspnea  Recommendations/Plan:  DNR/DNI  Patient continues to refuse BiPAP and pleurx catheter.  Recommend palliative services follow on discharge. Husband still not ready for hospice services.  CM to provide husband with list  of hospice agencies. Encouraged him to call a hospice agency to arrange meeting with him and three sons to further explain support hospice can provide in the home.    Code Status:  DNR  Prognosis:  Guarded prognosis with acute on chronic respiratory failure secondary to end-stage COPD and suspected lung cancer. High risk for decompensation requiring rehospitalization.   Discharge Planning:  Home. Recommend palliative service f/u.    Thank you for allowing the Palliative Medicine Team to assist in the care of this patient.   Total Time 37min Prolonged Time Billed  no       Greater than 50%  of this time was spent counseling and coordinating care related to the above assessment and plan.  Ihor Dow, FNP-C Palliative Medicine Team  Phone: [phone] Fax: [phone]

## 2017-02-27 ENCOUNTER — Telehealth: Payer: Self-pay | Admitting: Internal Medicine

## 2017-03-01 ENCOUNTER — Telehealth: Payer: Self-pay

## 2017-03-01 NOTE — Telephone Encounter (Signed)
On 03/01/17 I received a d/c from [employer] (orignal). The d/c is for cremation. The patient is a patient of Doctor Ramaswamy. The d/c will be taken to Pulmonary Unit @ [facility] for signature.  On 03/02/17 I received the d/c back from Doctor Ramaswmay. I got the d/c ready and called the funeral home to let them know the d/c is ready for pickup. I also faxed a copy to the funeral home per the funeral home request.

## 2017-03-09 NOTE — Telephone Encounter (Signed)
Received call transfer from [place] at pt's home now, pt has expired but they are unable to move patient from her home without someone to sign the death certificate.  Per pt's chart, MR is listed as her PCP.  Herbie [place] stated that pt's spouse reported MR is pt's PCP.  MR viewed patient's chart and agreed to sign the death certificate Herbie [facility] is aware  Nothing further needed; will sign off

## 2017-03-09 DEATH — deceased

## 2017-05-06 ENCOUNTER — Telehealth: Payer: Self-pay | Admitting: Internal Medicine

## 2019-09-12 IMAGING — DX DG CHEST 1V
1 series · 1 of 1 positions shown · non-contrast
Comparison: 02/02/2017

CLINICAL DATA: Status post left thoracentesis

EXAM:
CHEST 1 VIEW

[chest ap]
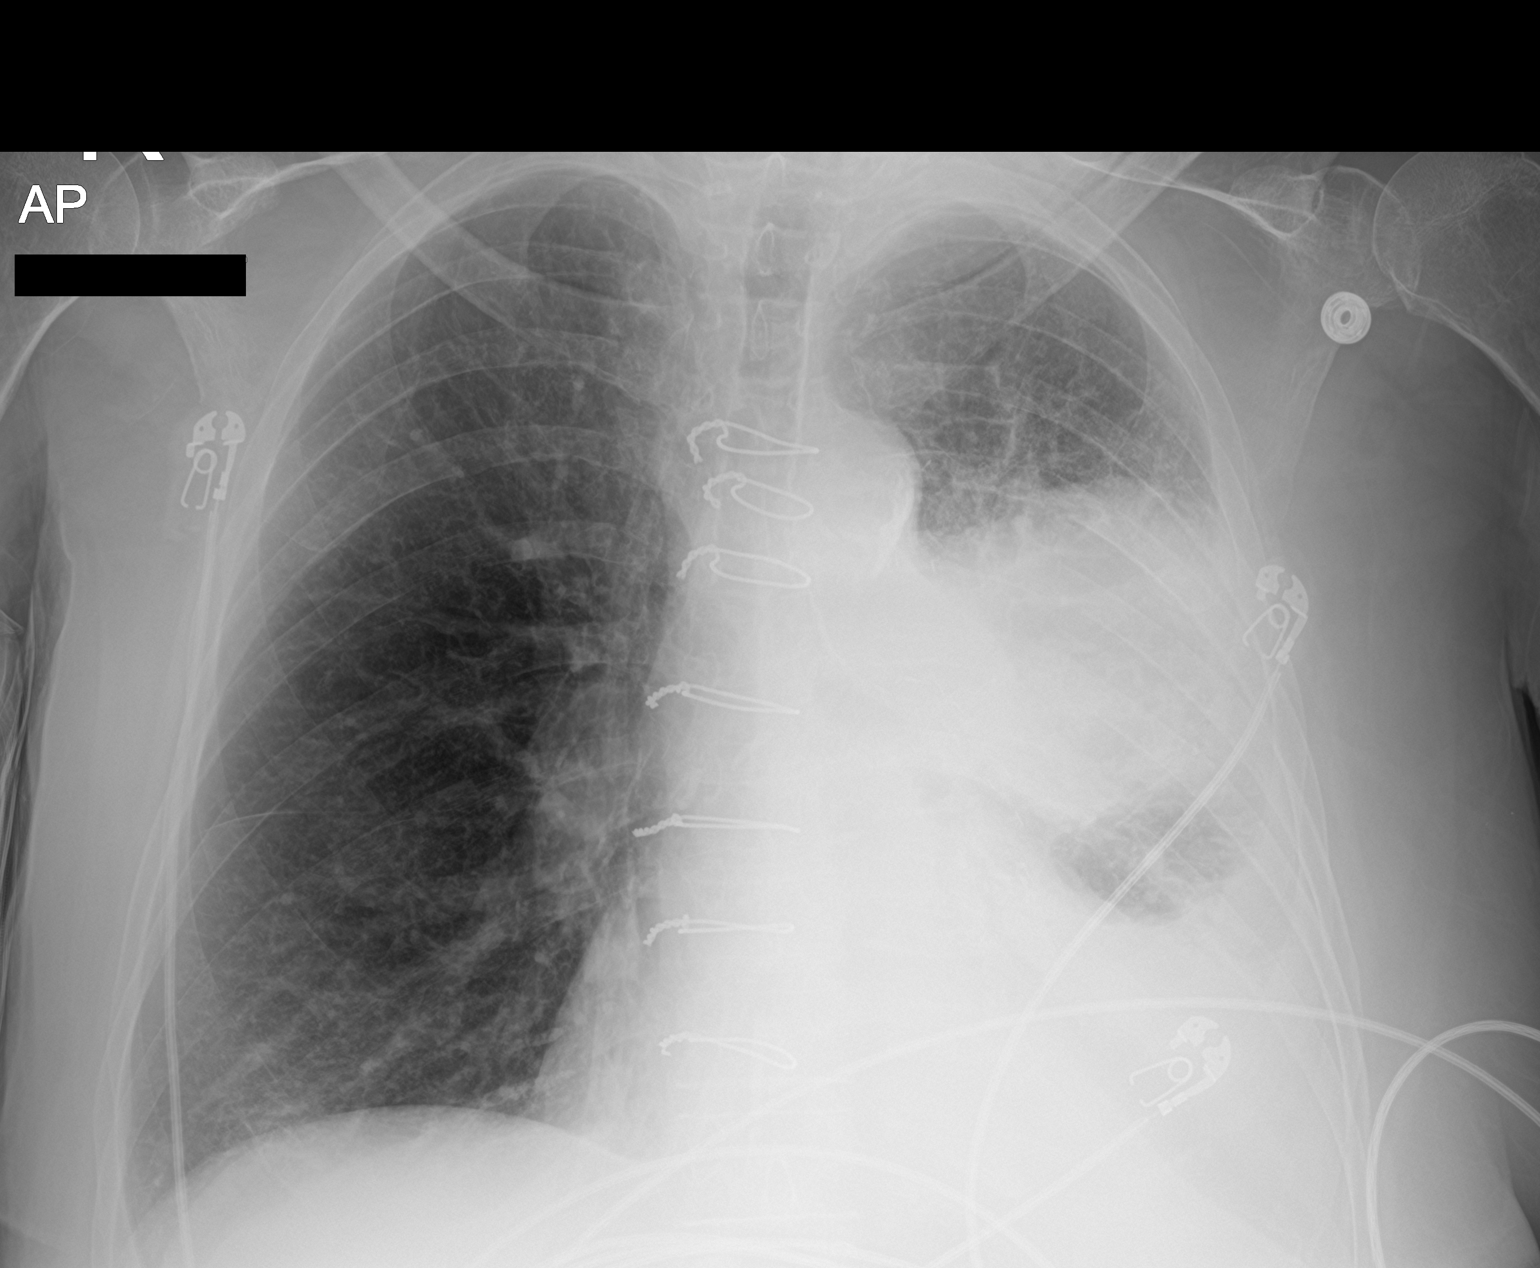

[1 of 1 positions shown; findings below may reference images not displayed]

FINDINGS: Right lung is again well aerated. Cardiac shadow is stable.
Postsurgical changes are again noted. Interval thoracentesis has
been performed. A small residual pleural effusion is noted
inferiorly. The large central mass lesion is noted which corresponds
to that seen on prior CT examination. No pneumothorax is noted.
IMPRESSION: No evidence of pneumothorax following thoracentesis.

Small residual pleural effusion is noted.

Large central mass lesion on the left.

## 2021-09-19 NOTE — Telephone Encounter (Signed)
error
